# Patient Record
Sex: Male | Born: 1950 | Race: White | Hispanic: No | Marital: Married | Smoking: Never smoker
Health system: Southern US, Community
[De-identification: ages and names within clinical notes are randomized; demographics above are authoritative.]

## PROBLEM LIST (undated history)

## (undated) DIAGNOSIS — E785 Hyperlipidemia, unspecified: Secondary | ICD-10-CM

## (undated) DIAGNOSIS — I10 Essential (primary) hypertension: Secondary | ICD-10-CM

## (undated) DIAGNOSIS — N183 Chronic kidney disease, stage 3 (moderate): Principal | ICD-10-CM

## (undated) DIAGNOSIS — Z9889 Other specified postprocedural states: Secondary | ICD-10-CM

## (undated) DIAGNOSIS — R112 Nausea with vomiting, unspecified: Secondary | ICD-10-CM

## (undated) DIAGNOSIS — M109 Gout, unspecified: Secondary | ICD-10-CM

## (undated) DIAGNOSIS — T148XXA Other injury of unspecified body region, initial encounter: Secondary | ICD-10-CM

## (undated) DIAGNOSIS — E1122 Type 2 diabetes mellitus with diabetic chronic kidney disease: Principal | ICD-10-CM

## (undated) DIAGNOSIS — M779 Enthesopathy, unspecified: Secondary | ICD-10-CM

## (undated) DIAGNOSIS — E291 Testicular hypofunction: Secondary | ICD-10-CM

## (undated) DIAGNOSIS — T7840XA Allergy, unspecified, initial encounter: Secondary | ICD-10-CM

## (undated) DIAGNOSIS — E119 Type 2 diabetes mellitus without complications: Secondary | ICD-10-CM

## (undated) HISTORY — DX: Testicular hypofunction: E29.1

## (undated) HISTORY — DX: Essential (primary) hypertension: I10

## (undated) HISTORY — DX: Chronic kidney disease, stage 3 (moderate): N18.3

## (undated) HISTORY — DX: Allergy, unspecified, initial encounter: T78.40XA

## (undated) HISTORY — DX: Type 2 diabetes mellitus with diabetic chronic kidney disease: E11.22

## (undated) HISTORY — DX: Hyperlipidemia, unspecified: E78.5

## (undated) HISTORY — PX: CYST REMOVAL HAND: SHX6279

## (undated) HISTORY — DX: Enthesopathy, unspecified: M77.9

## (undated) HISTORY — DX: Type 2 diabetes mellitus without complications: E11.9

## (undated) HISTORY — DX: Gout, unspecified: M10.9

---

## 1996-06-20 HISTORY — PX: WRIST SURGERY: SHX841

## 1998-05-10 ENCOUNTER — Emergency Department (HOSPITAL_COMMUNITY): Admission: EM | Admit: 1998-05-10 | Discharge: 1998-05-10 | Payer: Self-pay | Admitting: Family Medicine

## 2004-07-08 ENCOUNTER — Emergency Department (HOSPITAL_COMMUNITY): Admission: EM | Admit: 2004-07-08 | Discharge: 2004-07-08 | Payer: Self-pay | Admitting: Emergency Medicine

## 2008-06-20 HISTORY — PX: PROSTATE BIOPSY: SHX241

## 2009-07-31 ENCOUNTER — Ambulatory Visit (HOSPITAL_COMMUNITY): Admission: AD | Admit: 2009-07-31 | Discharge: 2009-08-01 | Payer: Self-pay | Admitting: Urology

## 2010-09-09 LAB — HEMOGLOBIN AND HEMATOCRIT, BLOOD
HCT: 41.9 % (ref 39.0–52.0)
HCT: 46.8 % (ref 39.0–52.0)
Hemoglobin: 14.1 g/dL (ref 13.0–17.0)
Hemoglobin: 16.1 g/dL (ref 13.0–17.0)

## 2010-09-09 LAB — CBC
MCHC: 34 g/dL (ref 30.0–36.0)
MCV: 82.5 fL (ref 78.0–100.0)
Platelets: 231 10*3/uL (ref 150–400)
RBC: 6.66 MIL/uL — ABNORMAL HIGH (ref 4.22–5.81)
RDW: 13.1 % (ref 11.5–15.5)

## 2010-09-09 LAB — PROTIME-INR
INR: 1.01 (ref 0.00–1.49)
Prothrombin Time: 13.2 seconds (ref 11.6–15.2)

## 2010-12-08 ENCOUNTER — Other Ambulatory Visit: Payer: Self-pay | Admitting: Internal Medicine

## 2013-07-04 ENCOUNTER — Ambulatory Visit: Payer: Self-pay | Admitting: Internal Medicine

## 2013-08-04 DIAGNOSIS — N183 Chronic kidney disease, stage 3 unspecified: Secondary | ICD-10-CM | POA: Insufficient documentation

## 2013-08-04 DIAGNOSIS — I10 Essential (primary) hypertension: Secondary | ICD-10-CM | POA: Insufficient documentation

## 2013-08-04 DIAGNOSIS — E1122 Type 2 diabetes mellitus with diabetic chronic kidney disease: Secondary | ICD-10-CM | POA: Insufficient documentation

## 2013-08-04 DIAGNOSIS — E785 Hyperlipidemia, unspecified: Secondary | ICD-10-CM

## 2013-08-04 DIAGNOSIS — M109 Gout, unspecified: Secondary | ICD-10-CM | POA: Insufficient documentation

## 2013-08-04 DIAGNOSIS — E349 Endocrine disorder, unspecified: Secondary | ICD-10-CM | POA: Insufficient documentation

## 2013-08-04 DIAGNOSIS — E1169 Type 2 diabetes mellitus with other specified complication: Secondary | ICD-10-CM | POA: Insufficient documentation

## 2013-08-07 ENCOUNTER — Ambulatory Visit: Payer: Self-pay | Admitting: Internal Medicine

## 2013-08-29 ENCOUNTER — Encounter: Payer: Self-pay | Admitting: Internal Medicine

## 2013-08-29 ENCOUNTER — Other Ambulatory Visit: Payer: Self-pay | Admitting: Internal Medicine

## 2013-08-29 ENCOUNTER — Ambulatory Visit (INDEPENDENT_AMBULATORY_CARE_PROVIDER_SITE_OTHER): Payer: BC Managed Care – PPO | Admitting: Internal Medicine

## 2013-08-29 VITALS — BP 148/80 | HR 72 | Temp 98.6°F | Resp 16 | Ht 69.0 in | Wt 190.0 lb

## 2013-08-29 DIAGNOSIS — I10 Essential (primary) hypertension: Secondary | ICD-10-CM

## 2013-08-29 DIAGNOSIS — Z79899 Other long term (current) drug therapy: Secondary | ICD-10-CM

## 2013-08-29 DIAGNOSIS — E119 Type 2 diabetes mellitus without complications: Secondary | ICD-10-CM

## 2013-08-29 DIAGNOSIS — E559 Vitamin D deficiency, unspecified: Secondary | ICD-10-CM

## 2013-08-29 DIAGNOSIS — E785 Hyperlipidemia, unspecified: Secondary | ICD-10-CM

## 2013-08-29 LAB — BASIC METABOLIC PANEL WITH GFR
BUN: 22 mg/dL (ref 6–23)
CHLORIDE: 103 meq/L (ref 96–112)
CO2: 27 meq/L (ref 19–32)
CREATININE: 1.17 mg/dL (ref 0.50–1.35)
Calcium: 9.7 mg/dL (ref 8.4–10.5)
GFR, Est African American: 77 mL/min
GFR, Est Non African American: 66 mL/min
Glucose, Bld: 103 mg/dL — ABNORMAL HIGH (ref 70–99)
Potassium: 4.8 mEq/L (ref 3.5–5.3)
Sodium: 137 mEq/L (ref 135–145)

## 2013-08-29 LAB — HEMOGLOBIN A1C
HEMOGLOBIN A1C: 7.1 % — AB (ref <5.7)
Mean Plasma Glucose: 157 mg/dL — ABNORMAL HIGH (ref <117)

## 2013-08-29 MED ORDER — BISOPROLOL-HYDROCHLOROTHIAZIDE 5-6.25 MG PO TABS: 1.0000 | ORAL_TABLET | Freq: Every day | ORAL | Status: DC

## 2013-08-29 NOTE — Progress Notes (Signed)
Patient ID: Emeric Novinger, male   DOB: [birth year], 63 y.o.   MRN: [id number]    This very nice 63 y.o. WWM presents for 3 month follow up with Hypertension, Hyperlipidemia, Pre-Diabetes and Vitamin D Deficiency.    HTN predates since 2000. BP has been controlled at home. Today's BP: 148/80 mmHg and rechecked at 164/84. Patient denies any cardiac type chest pain, palpitations, dyspnea/orthopnea/PND, dizziness, claudication, or dependent edema. GFR was 62 in Oct 2014 consistent with Stage II CKD   Hyperlipidemia is not controlled with diet & note patient is statin intolerant. Last Cholesterol was 229, Triglycerides were 349, HDL 34 and LDL 125. Patient denies myalgias or other med SE's.    Also, the patient has history of T2DM with A1c 7.6% in 2006 and last A1c was 7.3% in Oct 2014. Patient admits poor diet. Patient denies any symptoms of reactive hypoglycemia, diabetic polys, paresthesias or visual blurring.   Further, Patient has history of Vitamin D Deficiency of 14 in 2010 with last vitamin D of 68 in Oct 2014. Patient supplements vitamin D without any suspected side-effects. Patient also has Hx/o Testosterone Deficiency  In 2008 and has declined treatment as he feels and functions well.  Medication Sig  . allopurinol (ZYLOPRIM) 300 MG tablet Take 300 mg by mouth daily.  Marland Kitchen BABY ASPIRIN PO Take 81 mg by mouth daily.  . Cholecalciferol (VITAMIN D PO) Take 5,000 Int'l Units by mouth daily.  . enalapril (VASOTEC) 20 MG tablet Take 20 mg by mouth daily.      Allergies  Allergen Reactions  . Crestor [Rosuvastatin]   . Welchol [Colesevelam Hcl]   . Zoloft [Sertraline Hcl]   . Penicillins Rash    PMHx:   Past Medical History  Diagnosis Date  . Hyperlipidemia   . Hypertension   . Type II or unspecified type diabetes mellitus without mention of complication, not stated as uncontrolled   . Other testicular hypofunction   . Gout     FHx:    Reviewed / unchanged  SHx:    Reviewed /  unchanged     Systems Review: Constitutional: Denies fever, chills, wt changes, headaches, insomnia, fatigue, night sweats, change in appetite. Eyes: Denies redness, blurred vision, diplopia, discharge, itchy, watery eyes.  ENT: Denies discharge, congestion, post nasal drip, epistaxis, sore throat, earache, hearing loss, dental pain, tinnitus, vertigo, sinus pain, snoring.  CV: Denies chest pain, palpitations, irregular heartbeat, syncope, dyspnea, diaphoresis, orthopnea, PND, claudication, edema. Respiratory: denies cough, dyspnea, DOE, pleurisy, hoarseness, laryngitis, wheezing.  Gastrointestinal: Denies dysphagia, odynophagia, heartburn, reflux, water brash, abdominal pain or cramps, nausea, vomiting, bloating, diarrhea, constipation, hematemesis, melena, hematochezia,  or hemorrhoids. Genitourinary: Denies dysuria, frequency, urgency, nocturia, hesitancy, discharge, hematuria, flank pain. Musculoskeletal: Denies arthralgias, myalgias, stiffness, jt. swelling, pain, limp, strain/sprain.  Skin: Denies pruritus, rash, hives, warts, acne, eczema, change in skin lesion(s). Neuro: No weakness, tremor, incoordination, spasms, paresthesia, or pain. Psychiatric: Denies confusion, memory loss, or sensory loss. Endo: Denies change in weight, skin, hair change.  Heme/Lymph: No excessive bleeding, bruising, orenlarged lymph nodes.   Exam:  BP 148/80  Pulse 72  Temp(Src) 98.6 F (37 C) (Temporal)  Resp 16  Ht 5\' 9"  (1.753 m)  Wt 190 lb (86.183 kg)  BMI 28.05 kg/m2  Appears well nourished - in no distress. Eyes: PERRLA, EOMs, conjunctiva no swelling or erythema. Sinuses: No frontal/maxillary tenderness ENT/Mouth: EAC's clear, TM's nl w/o erythema, bulging. Nares clear w/o erythema, swelling, exudates. Oropharynx clear without erythema or exudates. Oral  hygiene is good. Tongue normal, non obstructing. Hearing intact.  Neck: Supple. Thyroid nl. Car 2+/2+ without bruits, nodes or JVD. Chest:  Respirations nl with BS clear & equal w/o rales, rhonchi, wheezing or stridor.  Cor: Heart sounds normal w/ regular rate and rhythm without sig. murmurs, gallops, clicks, or rubs. Peripheral pulses normal and equal  without edema.  Abdomen: Soft & bowel sounds normal. Non-tender w/o guarding, rebound, hernias, masses, or organomegaly.  Lymphatics: Unremarkable.  Musculoskeletal: Full ROM all peripheral extremities, joint stability, 5/5 strength, and normal gait.  Skin: Warm, dry without exposed rashes, lesions, ecchymosis apparent.  Neuro: Cranial nerves intact, reflexes equal bilaterally. Sensory-motor testing grossly intact. Tendon reflexes grossly intact.  Pysch: Alert & oriented x 3. Insight and judgement nl & appropriate. No ideations.  Assessment and Plan:  1. Hypertension - Continue monitor blood pressure at home. Continue diet/meds same.  2. Hyperlipidemia - Continue diet/meds, exercise,& lifestyle modifications. Continue monitor periodic cholesterol/liver & renal functions   3. T2 NIDDM/Stage II CKD- continue recommend prudent low glycemic diet, weight control, regular exercise, diabetic monitoring and periodic eye exams.  4. Vitamin D Deficiency - Continue supplementation.  Recommended regular exercise, BP monitoring, weight control, and discussed med and SE's. Recommended labs to assess and monitor clinical status. Further disposition pending results of labs.

## 2013-08-29 NOTE — Patient Instructions (Signed)

## 2013-09-03 ENCOUNTER — Encounter (INDEPENDENT_AMBULATORY_CARE_PROVIDER_SITE_OTHER): Payer: Self-pay

## 2013-12-30 ENCOUNTER — Encounter: Payer: Self-pay | Admitting: Internal Medicine

## 2013-12-30 NOTE — Progress Notes (Signed)
Patient ID: [name], male   DOB: Apr 17, 1951, 63 y.o.   MRN: [id number]  2sd     R e s c h e d u l e

## 2014-02-05 ENCOUNTER — Other Ambulatory Visit: Payer: Self-pay | Admitting: Internal Medicine

## 2014-02-07 ENCOUNTER — Other Ambulatory Visit: Payer: Self-pay | Admitting: Internal Medicine

## 2014-02-07 ENCOUNTER — Ambulatory Visit (INDEPENDENT_AMBULATORY_CARE_PROVIDER_SITE_OTHER): Payer: BC Managed Care – PPO | Admitting: Internal Medicine

## 2014-02-07 ENCOUNTER — Encounter: Payer: Self-pay | Admitting: Internal Medicine

## 2014-02-07 VITALS — BP 158/86 | HR 64 | Temp 98.2°F | Resp 16 | Ht 68.5 in | Wt 191.2 lb

## 2014-02-07 DIAGNOSIS — E559 Vitamin D deficiency, unspecified: Secondary | ICD-10-CM

## 2014-02-07 DIAGNOSIS — R7401 Elevation of levels of liver transaminase levels: Secondary | ICD-10-CM

## 2014-02-07 DIAGNOSIS — I10 Essential (primary) hypertension: Secondary | ICD-10-CM

## 2014-02-07 DIAGNOSIS — Z0000 Encounter for general adult medical examination without abnormal findings: Secondary | ICD-10-CM

## 2014-02-07 DIAGNOSIS — Z113 Encounter for screening for infections with a predominantly sexual mode of transmission: Secondary | ICD-10-CM

## 2014-02-07 DIAGNOSIS — E1129 Type 2 diabetes mellitus with other diabetic kidney complication: Secondary | ICD-10-CM

## 2014-02-07 DIAGNOSIS — R740 Nonspecific elevation of levels of transaminase and lactic acid dehydrogenase [LDH]: Secondary | ICD-10-CM

## 2014-02-07 DIAGNOSIS — Z1212 Encounter for screening for malignant neoplasm of rectum: Secondary | ICD-10-CM

## 2014-02-07 DIAGNOSIS — Z125 Encounter for screening for malignant neoplasm of prostate: Secondary | ICD-10-CM

## 2014-02-07 DIAGNOSIS — Z111 Encounter for screening for respiratory tuberculosis: Secondary | ICD-10-CM

## 2014-02-07 MED ORDER — BISOPROLOL-HYDROCHLOROTHIAZIDE 5-6.25 MG PO TABS: ORAL_TABLET | ORAL | Status: DC

## 2014-02-07 NOTE — Patient Instructions (Signed)
Recommend the book "The END of DIETING" by Dr Baker Janus   and the book "The END of DIABETES " by Dr Excell Seltzer  At [email or web address] - get book & Audio CD's      Being diabetic has a  300% increased risk for heart attack, stroke, cancer, and alzheimer- type vascular dementia. It is very important that you work harder with diet by avoiding all foods that are white except chicken & fish. Avoid white rice (brown & wild rice is OK), white potatoes (sweetpotatoes in moderation is OK), White bread or wheat bread or anything made out of white flour like bagels, donuts, rolls, buns, biscuits, cakes, pastries, cookies, pizza crust, and pasta (made from white flour & egg whites) - vegetarian pasta or spinach or wheat pasta is OK. Multigrain breads like Arnold's or [employer], or multigrain sandwich thins or flatbreads.  Diet, exercise and weight loss can reverse and cure diabetes in the early stages.  Diet, exercise and weight loss is very important in the control and prevention of complications of diabetes which affects every system in your body, ie. Brain - dementia/stroke, eyes - glaucoma/blindness, heart - heart attack/heart failure, kidneys - dialysis, stomach - gastric paralysis, intestines - malabsorption, nerves - severe painful neuritis, circulation - gangrene & loss of a leg(s), and finally cancer and Alzheimers.    I recommend avoid fried & greasy foods,  sweets/candy, white rice (brown or wild rice or Quinoa is OK), white potatoes (sweet potatoes are OK) - anything made from white flour - bagels, doughnuts, rolls, buns, biscuits,white and wheat breads, pizza crust and traditional pasta made of white flour & egg white(vegetarian pasta or spinach or wheat pasta is OK).  Multi-grain bread is OK - like multi-grain flat bread or sandwich thins. Avoid alcohol in excess. Exercise is also important.    Eat all the vegetables you want - avoid meat, especially red meat and dairy - especially cheese.  Cheese  is the most concentrated form of trans-fats which is the worst thing to clog up our arteries. Veggie cheese is OK which can be found in the fresh produce section at [employer] or [employer] or [employer]  Preventive Care for Adults A healthy lifestyle and preventive care can promote health and wellness. Preventive health guidelines for men include the following key practices:  A routine yearly physical is a good way to check with your health care provider about your health and preventative screening. It is a chance to share any concerns and updates on your health and to receive a thorough exam.  Visit your dentist for a routine exam and preventative care every 6 months. Brush your teeth twice a day and floss once a day. Good oral hygiene prevents tooth decay and gum disease.  The frequency of eye exams is based on your age, health, family medical history, use of contact lenses, and other factors. Follow your health care provider's recommendations for frequency of eye exams.  Eat a healthy diet. Foods such as vegetables, fruits, whole grains, low-fat dairy products, and lean protein foods contain the nutrients you need without too many calories. Decrease your intake of foods high in solid fats, added sugars, and salt. Eat the right amount of calories for you.Get information about a proper diet from your health care provider, if necessary.  Regular physical exercise is one of the most important things you can do for your health. Most adults should get at least 150 minutes of moderate-intensity exercise (any activity that  increases your heart rate and causes you to sweat) each week. In addition, most adults need muscle-strengthening exercises on 2 or more days a week.  Maintain a healthy weight. The body mass index (BMI) is a screening tool to identify possible weight problems. It provides an estimate of body fat based on height and weight. Your health care provider can find your BMI and can help you  achieve or maintain a healthy weight.For adults 20 years and older:  A BMI below 18.5 is considered underweight.  A BMI of 18.5 to 24.9 is normal.  A BMI of 25 to 29.9 is considered overweight.  A BMI of 30 and above is considered obese.  Maintain normal blood lipids and cholesterol levels by exercising and minimizing your intake of saturated fat. Eat a balanced diet with plenty of fruit and vegetables. Blood tests for lipids and cholesterol should begin at age 20 and be repeated every 5 years. If your lipid or cholesterol levels are high, you are over 50, or you are at high risk for heart disease, you may need your cholesterol levels checked more frequently.Ongoing high lipid and cholesterol levels should be treated with medicines if diet and exercise are not working.  If you smoke, find out from your health care provider how to quit. If you do not use tobacco, do not start.  Lung cancer screening is recommended for adults aged 72-80 years who are at high risk for developing lung cancer because of a history of smoking. A yearly low-dose CT scan of the lungs is recommended for people who have at least a 30-pack-year history of smoking and are a current smoker or have quit within the past 15 years. A pack year of smoking is smoking an average of 1 pack of cigarettes a day for 1 year (for example: 1 pack a day for 30 years or 2 packs a day for 15 years). Yearly screening should continue until the smoker has stopped smoking for at least 15 years. Yearly screening should be stopped for people who develop a health problem that would prevent them from having lung cancer treatment.  If you choose to drink alcohol, do not have more than 2 drinks per day. One drink is considered to be 12 ounces (355 mL) of beer, 5 ounces (148 mL) of wine, or 1.5 ounces (44 mL) of liquor.  Avoid use of street drugs. Do not share needles with anyone. Ask for help if you need support or instructions about stopping the use of  drugs.  High blood pressure causes heart disease and increases the risk of stroke. Your blood pressure should be checked at least every 1-2 years. Ongoing high blood pressure should be treated with medicines, if weight loss and exercise are not effective.  If you are 28-64 years old, ask your health care provider if you should take aspirin to prevent heart disease.  Diabetes screening involves taking a blood sample to check your fasting blood sugar level. This should be done once every 3 years, after age 13, if you are within normal weight and without risk factors for diabetes. Testing should be considered at a younger age or be carried out more frequently if you are overweight and have at least 1 risk factor for diabetes.  Colorectal cancer can be detected and often prevented. Most routine colorectal cancer screening begins at the age of 78 and continues through age 56. However, your health care provider may recommend screening at an earlier age if you have risk  factors for colon cancer. On a yearly basis, your health care provider may provide home test kits to check for hidden blood in the stool. Use of a small camera at the end of a tube to directly examine the colon (sigmoidoscopy or colonoscopy) can detect the earliest forms of colorectal cancer. Talk to your health care provider about this at age 48, when routine screening begins. Direct exam of the colon should be repeated every 5-10 years through age 60, unless early forms of precancerous polyps or small growths are found.  People who are at an increased risk for hepatitis B should be screened for this virus. You are considered at high risk for hepatitis B if:  You were born in a country where hepatitis B occurs often. Talk with your health care provider about which countries are considered high risk.  Your parents were born in a high-risk country and you have not received a shot to protect against hepatitis B (hepatitis B vaccine).  You have  HIV or AIDS.  You use needles to inject street drugs.  You live with, or have sex with, someone who has hepatitis B.  You are a man who has sex with other men (MSM).  You get hemodialysis treatment.  You take certain medicines for conditions such as cancer, organ transplantation, and autoimmune conditions.  Hepatitis C blood testing is recommended for all people born from [birth year] through 1965 and any individual with known risks for hepatitis C.  Practice safe sex. Use condoms and avoid high-risk sexual practices to reduce the spread of sexually transmitted infections (STIs). STIs include gonorrhea, chlamydia, syphilis, trichomonas, herpes, HPV, and human immunodeficiency virus (HIV). Herpes, HIV, and HPV are viral illnesses that have no cure. They can result in disability, cancer, and death.  If you are at risk of being infected with HIV, it is recommended that you take a prescription medicine daily to prevent HIV infection. This is called preexposure prophylaxis (PrEP). You are considered at risk if:  You are a man who has sex with other men (MSM) and have other risk factors.  You are a heterosexual man, are sexually active, and are at increased risk for HIV infection.  You take drugs by injection.  You are sexually active with a partner who has HIV.  Talk with your health care provider about whether you are at high risk of being infected with HIV. If you choose to begin PrEP, you should first be tested for HIV. You should then be tested every 3 months for as long as you are taking PrEP.  A one-time screening for abdominal aortic aneurysm (AAA) and surgical repair of large AAAs by ultrasound are recommended for men ages 51 to 11 years who are current or former smokers.  Healthy men should no longer receive prostate-specific antigen (PSA) blood tests as part of routine cancer screening. Talk with your health care provider about prostate cancer screening.  Testicular cancer screening is  not recommended for adult males who have no symptoms. Screening includes self-exam, a health care provider exam, and other screening tests. Consult with your health care provider about any symptoms you have or any concerns you have about testicular cancer.  Use sunscreen. Apply sunscreen liberally and repeatedly throughout the day. You should seek shade when your shadow is shorter than you. Protect yourself by wearing long sleeves, pants, a wide-brimmed hat, and sunglasses year round, whenever you are outdoors.  Once a month, do a whole-body skin exam, using a mirror to look  at the skin on your back. Tell your health care provider about new moles, moles that have irregular borders, moles that are larger than a pencil eraser, or moles that have changed in shape or color.  Stay current with required vaccines (immunizations).  Influenza vaccine. All adults should be immunized every year.  Tetanus, diphtheria, and acellular pertussis (Td, Tdap) vaccine. An adult who has not previously received Tdap or who does not know his vaccine status should receive 1 dose of Tdap. This initial dose should be followed by tetanus and diphtheria toxoids (Td) booster doses every 10 years. Adults with an unknown or incomplete history of completing a 3-dose immunization series with Td-containing vaccines should begin or complete a primary immunization series including a Tdap dose. Adults should receive a Td booster every 10 years.  Varicella vaccine. An adult without evidence of immunity to varicella should receive 2 doses or a second dose if he has previously received 1 dose.  Human papillomavirus (HPV) vaccine. Males aged 29-21 years who have not received the vaccine previously should receive the 3-dose series. Males aged 22-26 years may be immunized. Immunization is recommended through the age of 26 years for any male who has sex with males and did not get any or all doses earlier. Immunization is recommended for any  person with an immunocompromised condition through the age of 29 years if he did not get any or all doses earlier. During the 3-dose series, the second dose should be obtained 4-8 weeks after the first dose. The third dose should be obtained 24 weeks after the first dose and 16 weeks after the second dose.  Zoster vaccine. One dose is recommended for adults aged 38 years or older unless certain conditions are present.  Measles, mumps, and rubella (MMR) vaccine. Adults born before [birth year] generally are considered immune to measles and mumps. Adults born in [birth year] or later should have 1 or more doses of MMR vaccine unless there is a contraindication to the vaccine or there is laboratory evidence of immunity to each of the three diseases. A routine second dose of MMR vaccine should be obtained at least 28 days after the first dose for students attending postsecondary schools, health care workers, or international travelers. People who received inactivated measles vaccine or an unknown type of measles vaccine during 1963-1967 should receive 2 doses of MMR vaccine. People who received inactivated mumps vaccine or an unknown type of mumps vaccine before 1979 and are at high risk for mumps infection should consider immunization with 2 doses of MMR vaccine. Unvaccinated health care workers born before [birth year] who lack laboratory evidence of measles, mumps, or rubella immunity or laboratory confirmation of disease should consider measles and mumps immunization with 2 doses of MMR vaccine or rubella immunization with 1 dose of MMR vaccine.  Pneumococcal 13-valent conjugate (PCV13) vaccine. When indicated, a person who is uncertain of his immunization history and has no record of immunization should receive the PCV13 vaccine. An adult aged 68 years or older who has certain medical conditions and has not been previously immunized should receive 1 dose of PCV13 vaccine. This PCV13 should be followed with a dose of pneumococcal  polysaccharide (PPSV23) vaccine. The PPSV23 vaccine dose should be obtained at least 8 weeks after the dose of PCV13 vaccine. An adult aged 95 years or older who has certain medical conditions and previously received 1 or more doses of PPSV23 vaccine should receive 1 dose of PCV13. The PCV13 vaccine dose should be obtained 1  or more years after the last PPSV23 vaccine dose.  Pneumococcal polysaccharide (PPSV23) vaccine. When PCV13 is also indicated, PCV13 should be obtained first. All adults aged 10 years and older should be immunized. An adult younger than age 43 years who has certain medical conditions should be immunized. Any person who resides in a nursing home or long-term care facility should be immunized. An adult smoker should be immunized. People with an immunocompromised condition and certain other conditions should receive both PCV13 and PPSV23 vaccines. People with human immunodeficiency virus (HIV) infection should be immunized as soon as possible after diagnosis. Immunization during chemotherapy or radiation therapy should be avoided. Routine use of PPSV23 vaccine is not recommended for American Indians, [place]atives, or people younger than 65 years unless there are medical conditions that require PPSV23 vaccine. When indicated, people who have unknown immunization and have no record of immunization should receive PPSV23 vaccine. One-time revaccination 5 years after the first dose of PPSV23 is recommended for people aged 19-64 years who have chronic kidney failure, nephrotic syndrome, asplenia, or immunocompromised conditions. People who received 1-2 doses of PPSV23 before age 43 years should receive another dose of PPSV23 vaccine at age 43 years or later if at least 5 years have passed since the previous dose. Doses of PPSV23 are not needed for people immunized with PPSV23 at or after age 63 years.  Meningococcal vaccine. Adults with asplenia or persistent complement component deficiencies  should receive 2 doses of quadrivalent meningococcal conjugate (MenACWY-D) vaccine. The doses should be obtained at least 2 months apart. Microbiologists working with certain meningococcal bacteria, [place] recruits, people at risk during an outbreak, and people who travel to or live in countries with a high rate of meningitis should be immunized. A first-year college student up through age 70 years who is living in a residence hall should receive a dose if he did not receive a dose on or after his 16th birthday. Adults who have certain high-risk conditions should receive one or more doses of vaccine.  Hepatitis A vaccine. Adults who wish to be protected from this disease, have certain high-risk conditions, work with hepatitis A-infected animals, work in hepatitis A research labs, or travel to or work in countries with a high rate of hepatitis A should be immunized. Adults who were previously unvaccinated and who anticipate close contact with an international adoptee during the first 60 days after arrival in the [place] from a country with a high rate of hepatitis A should be immunized.  Hepatitis B vaccine. Adults should be immunized if they wish to be protected from this disease, have certain high-risk conditions, may be exposed to blood or other infectious body fluids, are household contacts or sex partners of hepatitis B positive people, are clients or workers in certain care facilities, or travel to or work in countries with a high rate of hepatitis B.  Haemophilus influenzae type b (Hib) vaccine. A previously unvaccinated person with asplenia or sickle cell disease or having a scheduled splenectomy should receive 1 dose of Hib vaccine. Regardless of previous immunization, a recipient of a hematopoietic stem cell transplant should receive a 3-dose series 6-12 months after his successful transplant. Hib vaccine is not recommended for adults with HIV infection. Preventive Service / Frequency Ages  76 to 70  Blood pressure check.** / Every 1 to 2 years.  Lipid and cholesterol check.** / Every 5 years beginning at age 32.  Lung cancer screening. / Every year if you are aged 85-80  years and have a 30-pack-year history of smoking and currently smoke or have quit within the past 15 years. Yearly screening is stopped once you have quit smoking for at least 15 years or develop a health problem that would prevent you from having lung cancer treatment.  Fecal occult blood test (FOBT) of stool. / Every year beginning at age 29 and continuing until age 38. You may not have to do this test if you get a colonoscopy every 10 years.  Flexible sigmoidoscopy** or colonoscopy.** / Every 5 years for a flexible sigmoidoscopy or every 10 years for a colonoscopy beginning at age 4 and continuing until age 30.  Hepatitis C blood test.** / For all people born from [birth year] through 1965 and any individual with known risks for hepatitis C.  Skin self-exam. / Monthly.  Influenza vaccine. / Every year.  Tetanus, diphtheria, and acellular pertussis (Tdap/Td) vaccine.** / Consult your health care provider. 1 dose of Td every 10 years.  Varicella vaccine.** / Consult your health care provider.  Zoster vaccine.** / 1 dose for adults aged 52 years or older.  Measles, mumps, rubella (MMR) vaccine.** / You need at least 1 dose of MMR if you were born in 1957 or later. You may also need a second dose.  Pneumococcal 13-valent conjugate (PCV13) vaccine.** / Consult your health care provider.  Pneumococcal polysaccharide (PPSV23) vaccine.** / 1 to 2 doses if you smoke cigarettes or if you have certain conditions.  Meningococcal vaccine.** / Consult your health care provider.  Hepatitis A vaccine.** / Consult your health care provider.  Hepatitis B vaccine.** / Consult your health care provider.  Haemophilus influenzae type b (Hib) vaccine.** / Consult your health care provider.  Abdominal aortic aneurysm (AAA)  screening.** / Screening for current or former smokers or history of Hypertension or Diabetes

## 2014-02-07 NOTE — Progress Notes (Signed)
Patient ID: [name], male   DOB: [birth year], 63 y.o.   MRN: [id number]   Annual Screening Comprehensive Examination  This very nice 63 y.o.WWM presents for complete physical.  Patient has been followed for HTN, T2_DM, Hyperlipidemia, Testosterone and Vitamin D Deficiency.   HTN predates since 2000. Patient's BP has been controlled at home. Today's BP is elevated  158/86 mmHg and patient did not fill last Rx for Ziac. Patient denies any cardiac symptoms as chest pain, palpitations, shortness of breath, dizziness or ankle swelling.   Patient's hyperlipidemia is not controlled with diet and he is allergic/intolerant to Statins & Welchol. Patient denies myalgias or other medication SE's. Last Chol elevated at 229, TG 349, HDL 34 and LDL 125 and patient has not filled prescribed Zetia in the past.    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 Patient admits not monitoring CBG's. Last A1c was 7.1% on 08/29/2013.    Finally, patient has history of Vitamin D Deficiency of 14 in 2010 and last vitamin D was 68 in Oct 2014.   Medication Sig  . allopurinol  300 MG tablet Take 300 mg by mouth daily.  Marland Kitchen bASA 81 mg Take 1 tab daily   . VITAMIN D Take 5,000 Int'l Units by mouth daily.  . enalapril 20 MG tablet TAKE 1 TABLET EVERY DAY   Allergies  Allergen Reactions  . Crestor [Rosuvastatin]   . Welchol [Colesevelam Hcl]   . Zoloft [Sertraline Hcl]   . Penicillins Rash   Past Medical History  Diagnosis Date  . Hyperlipidemia   . Hypertension   . Type II or unspecified type diabetes mellitus without mention of complication, not stated as uncontrolled   . Other testicular hypofunction   . Gout    Past Surgical History  Procedure Laterality Date  . Wrist surgery Right 1998  . Prostate biopsy  2010   Family History  Problem Relation Age of Onset  . Heart disease Father    . Kidney disease Father   . Heart attack Father    History   Social History  . Marital Status: Widowed in May 2010 (after 20 yr marriage)    Spouse Name: N/A    Number of Children: 1 adopted daughter and 2 [place]  . Years of Education: N/A   Occupational History  . Sales at [employer]  . Smoking status: Never Smoker   . Smokeless tobacco: Never Used  . Alcohol Use: No  . Drug Use: No  . Sexual Activity: Active    ROS Constitutional: Denies fever, chills, weight loss/gain, headaches, insomnia, fatigue, night sweats or change in appetite. Eyes: Denies redness, blurred vision, diplopia, discharge, itchy or watery eyes.  ENT: Denies discharge, congestion, post nasal drip, epistaxis, sore throat, earache, hearing loss, dental pain, Tinnitus, Vertigo, Sinus pain or snoring.  Cardio: Denies chest pain, palpitations, irregular heartbeat, syncope, dyspnea, diaphoresis, orthopnea, PND, claudication or edema Respiratory: denies cough, dyspnea, DOE, pleurisy, hoarseness, laryngitis or wheezing.  Gastrointestinal: Denies dysphagia, heartburn, reflux, water brash, pain, cramps, nausea, vomiting, bloating, diarrhea, constipation, hematemesis, melena, hematochezia, jaundice or hemorrhoids Genitourinary: Denies dysuria, frequency, urgency, nocturia, hesitancy, discharge, hematuria or flank pain Musculoskeletal: Denies arthralgia, myalgia, stiffness, Jt. Swelling, pain, limp or strain/sprain. Denies Falls. Skin: Denies puritis, rash, hives, warts, acne, eczema or change in skin lesion Neuro: No weakness, tremor, incoordination, spasms, paresthesia  or pain Psychiatric: Denies confusion, memory loss or sensory loss. Denies Depression. Endocrine: Denies change in weight, skin, hair change, nocturia, and paresthesia, diabetic polys, visual blurring or hyper / hypo glycemic episodes.  Heme/Lymph: No excessive bleeding, bruising or enlarged lymph  nodes.   Physical Exam  BP 158/86  Pulse 64  Temp(Src) 98.2 F (36.8 C) (Temporal)  Resp 16  Ht 5' 8.5" (1.74 m)  Wt 191 lb 3.2 oz (86.728 kg)  BMI 28.65 kg/m2  General Appearance: Well nourished, in no apparent distress. Eyes: PERRLA, EOMs, conjunctiva no swelling or erythema, normal fundi and vessels. Sinuses: No frontal/maxillary tenderness ENT/Mouth: EACs patent / TMs  nl. Nares clear without erythema, swelling, mucoid exudates. Oral hygiene is good. No erythema, swelling, or exudate. Tongue normal, non-obstructing. Tonsils not swollen or erythematous. Hearing normal.  Neck: Supple, thyroid normal. No bruits, nodes or JVD. Respiratory: Respiratory effort normal.  BS equal and clear bilateral without rales, rhonci, wheezing or stridor. Cardio: Heart sounds are normal with regular rate and rhythm and no murmurs, rubs or gallops. Peripheral pulses are normal and equal bilaterally without edema. No aortic or femoral bruits. Chest: symmetric with normal excursions and percussion.  Abdomen: Flat, soft, with bowl sounds. Nontender, no guarding, rebound, hernias, masses, or organomegaly.  Lymphatics: Non tender without lymphadenopathy.  Genitourinary: No hernias.Testes nl. DRE - prostate nl for age - smooth & firm w/o nodules. Musculoskeletal: Full ROM all peripheral extremities, joint stability, 5/5 strength, and normal gait. Skin: Warm and dry without rashes, lesions, cyanosis, clubbing or  ecchymosis.  Neuro: Cranial nerves intact, reflexes equal bilaterally. Normal muscle tone, no cerebellar symptoms. Sensation intact.  Pysch: Awake and oriented X 3with normal affect, insight and judgment appropriate.  Assessment and Plan  1. Annual Screening Examination 2. Hypertension - re-written Rx Ziac 5  3. Hyperlipidemia 4. T2_NIDDM w/Stage 2 CKD 5. Vitamin D Deficiency 6. Testosterone Deficiency 7. Gout  Continue prudent diet as discussed, weight control, BP monitoring, regular  exercise, and medications as discussed.  Discussed med effects and SE's. Routine screening labs and tests as requested with regular follow-up as recommended.Discussed poor compliance issues with patient.

## 2014-02-08 LAB — CBC WITH DIFFERENTIAL/PLATELET
BASOS PCT: 1 % (ref 0–1)
Basophils Absolute: 0.1 10*3/uL (ref 0.0–0.1)
EOS ABS: 0.2 10*3/uL (ref 0.0–0.7)
Eosinophils Relative: 3 % (ref 0–5)
HEMATOCRIT: 46.9 % (ref 39.0–52.0)
HEMOGLOBIN: 16.2 g/dL (ref 13.0–17.0)
Lymphocytes Relative: 28 % (ref 12–46)
Lymphs Abs: 1.8 10*3/uL (ref 0.7–4.0)
MCH: 28.1 pg (ref 26.0–34.0)
MCHC: 34.5 g/dL (ref 30.0–36.0)
MCV: 81.3 fL (ref 78.0–100.0)
MONO ABS: 0.5 10*3/uL (ref 0.1–1.0)
Monocytes Relative: 8 % (ref 3–12)
Neutro Abs: 3.9 10*3/uL (ref 1.7–7.7)
Neutrophils Relative %: 60 % (ref 43–77)
Platelets: 241 10*3/uL (ref 150–400)
RBC: 5.77 MIL/uL (ref 4.22–5.81)
RDW: 14.4 % (ref 11.5–15.5)
WBC: 6.5 10*3/uL (ref 4.0–10.5)

## 2014-02-08 LAB — HEPATIC FUNCTION PANEL
ALT: 41 U/L (ref 0–53)
AST: 32 U/L (ref 0–37)
Albumin: 4.9 g/dL (ref 3.5–5.2)
Alkaline Phosphatase: 59 U/L (ref 39–117)
BILIRUBIN DIRECT: 0.1 mg/dL (ref 0.0–0.3)
BILIRUBIN INDIRECT: 0.7 mg/dL (ref 0.2–1.2)
Total Bilirubin: 0.8 mg/dL (ref 0.2–1.2)
Total Protein: 7.5 g/dL (ref 6.0–8.3)

## 2014-02-08 LAB — URIC ACID: Uric Acid, Serum: 7.5 mg/dL (ref 4.0–7.8)

## 2014-02-08 LAB — URINALYSIS, MICROSCOPIC ONLY
BACTERIA UA: NONE SEEN
Casts: NONE SEEN
Crystals: NONE SEEN
Squamous Epithelial / LPF: NONE SEEN

## 2014-02-08 LAB — BASIC METABOLIC PANEL WITH GFR
BUN: 16 mg/dL (ref 6–23)
CALCIUM: 9.8 mg/dL (ref 8.4–10.5)
CO2: 23 meq/L (ref 19–32)
Chloride: 99 mEq/L (ref 96–112)
Creat: 1.09 mg/dL (ref 0.50–1.35)
GFR, EST AFRICAN AMERICAN: 84 mL/min
GFR, Est Non African American: 72 mL/min
GLUCOSE: 132 mg/dL — AB (ref 70–99)
POTASSIUM: 4.7 meq/L (ref 3.5–5.3)
SODIUM: 136 meq/L (ref 135–145)

## 2014-02-08 LAB — MAGNESIUM: Magnesium: 2 mg/dL (ref 1.5–2.5)

## 2014-02-08 LAB — HEPATITIS C ANTIBODY: HCV Ab: NEGATIVE

## 2014-02-08 LAB — HEMOGLOBIN A1C
Hgb A1c MFr Bld: 7.1 % — ABNORMAL HIGH (ref <5.7)
MEAN PLASMA GLUCOSE: 157 mg/dL — AB (ref <117)

## 2014-02-08 LAB — VITAMIN D 25 HYDROXY (VIT D DEFICIENCY, FRACTURES): Vit D, 25-Hydroxy: 56 ng/mL (ref 30–89)

## 2014-02-08 LAB — VITAMIN B12: Vitamin B-12: 360 pg/mL (ref 211–911)

## 2014-02-08 LAB — LIPID PANEL
CHOL/HDL RATIO: 6.6 ratio
Cholesterol: 249 mg/dL — ABNORMAL HIGH (ref 0–200)
HDL: 38 mg/dL — ABNORMAL LOW (ref >39)
LDL Cholesterol: 167 mg/dL — ABNORMAL HIGH (ref 0–99)
Triglycerides: 221 mg/dL — ABNORMAL HIGH (ref <150)
VLDL: 44 mg/dL — AB (ref 0–40)

## 2014-02-08 LAB — PSA: PSA: 3.39 ng/mL (ref <=4.00)

## 2014-02-08 LAB — INSULIN, FASTING: Insulin fasting, serum: 14 u[IU]/mL (ref 3–28)

## 2014-02-08 LAB — HEPATITIS B CORE ANTIBODY, TOTAL: HEP B C TOTAL AB: NONREACTIVE

## 2014-02-08 LAB — MICROALBUMIN / CREATININE URINE RATIO
CREATININE, URINE: 30.7 mg/dL
MICROALB UR: 0.5 mg/dL (ref 0.00–1.89)
MICROALB/CREAT RATIO: 16.3 mg/g (ref 0.0–30.0)

## 2014-02-08 LAB — HEPATITIS A ANTIBODY, TOTAL: HEP A TOTAL AB: REACTIVE — AB

## 2014-02-08 LAB — HEPATITIS B SURFACE ANTIBODY,QUALITATIVE: HEP B S AB: NEGATIVE

## 2014-02-08 LAB — TESTOSTERONE: TESTOSTERONE: 385 ng/dL (ref 300–890)

## 2014-02-08 LAB — HIV ANTIBODY (ROUTINE TESTING W REFLEX): HIV 1&2 Ab, 4th Generation: NONREACTIVE

## 2014-02-08 LAB — TSH: TSH: 1.425 u[IU]/mL (ref 0.350–4.500)

## 2014-02-08 LAB — RPR

## 2014-02-09 ENCOUNTER — Other Ambulatory Visit: Payer: Self-pay | Admitting: Internal Medicine

## 2014-02-09 MED ORDER — METFORMIN HCL ER 500 MG PO TB24: ORAL_TABLET | ORAL | Status: DC

## 2014-02-10 LAB — HEPATITIS B E ANTIBODY: HEPATITIS BE ANTIBODY: NONREACTIVE

## 2014-02-11 ENCOUNTER — Other Ambulatory Visit: Payer: Self-pay | Admitting: *Deleted

## 2014-02-11 DIAGNOSIS — R972 Elevated prostate specific antigen [PSA]: Secondary | ICD-10-CM

## 2014-02-11 LAB — PSA, TOTAL AND FREE
PSA FREE PCT: 22 % — AB (ref >25)
PSA FREE: 0.8 ng/mL
PSA: 3.72 ng/mL (ref <=4.00)

## 2014-02-11 LAB — TB SKIN TEST
Induration: 0 mm
TB Skin Test: NEGATIVE

## 2014-02-12 ENCOUNTER — Telehealth: Payer: Self-pay | Admitting: *Deleted

## 2014-02-12 NOTE — Telephone Encounter (Signed)
Pt aware of lab results 

## 2014-04-04 ENCOUNTER — Other Ambulatory Visit: Payer: Self-pay

## 2014-05-20 ENCOUNTER — Other Ambulatory Visit: Payer: Self-pay | Admitting: Internal Medicine

## 2014-05-20 DIAGNOSIS — I10 Essential (primary) hypertension: Secondary | ICD-10-CM

## 2014-05-20 MED ORDER — ENALAPRIL MALEATE 20 MG PO TABS: 20.0000 mg | ORAL_TABLET | Freq: Every day | ORAL | Status: DC

## 2014-05-21 ENCOUNTER — Encounter: Payer: Self-pay | Admitting: Physician Assistant

## 2014-05-21 ENCOUNTER — Ambulatory Visit: Payer: Self-pay | Admitting: Physician Assistant

## 2014-05-21 ENCOUNTER — Ambulatory Visit (INDEPENDENT_AMBULATORY_CARE_PROVIDER_SITE_OTHER): Payer: BC Managed Care – PPO | Admitting: Physician Assistant

## 2014-05-21 VITALS — BP 130/72 | HR 60 | Temp 98.1°F | Resp 16 | Ht 68.5 in | Wt 192.0 lb

## 2014-05-21 DIAGNOSIS — E785 Hyperlipidemia, unspecified: Secondary | ICD-10-CM

## 2014-05-21 DIAGNOSIS — E1122 Type 2 diabetes mellitus with diabetic chronic kidney disease: Secondary | ICD-10-CM

## 2014-05-21 DIAGNOSIS — Z79899 Other long term (current) drug therapy: Secondary | ICD-10-CM

## 2014-05-21 DIAGNOSIS — R972 Elevated prostate specific antigen [PSA]: Secondary | ICD-10-CM

## 2014-05-21 DIAGNOSIS — Z125 Encounter for screening for malignant neoplasm of prostate: Secondary | ICD-10-CM

## 2014-05-21 DIAGNOSIS — N182 Chronic kidney disease, stage 2 (mild): Secondary | ICD-10-CM

## 2014-05-21 DIAGNOSIS — E559 Vitamin D deficiency, unspecified: Secondary | ICD-10-CM

## 2014-05-21 DIAGNOSIS — I10 Essential (primary) hypertension: Secondary | ICD-10-CM

## 2014-05-21 NOTE — Progress Notes (Signed)
Assessment and Plan:  Hypertension: Continue medication, monitor blood pressure at home. Continue DASH diet.  Reminder to go to the ER if any CP, SOB, nausea, dizziness, severe HA, changes vision/speech, left arm numbness and tingling and jaw pain. Cholesterol: Continue diet and exercise. Check cholesterol. Long discussion will think about praulent.  Diabetes with CKD-Continue diet and exercise. Check A1C Vitamin D Def- check level and continue medications Elevated PSA- history of biopsy with complications and would prefer to monitor his numbers.   Continue diet and meds as discussed. Further disposition pending results of labs. Discussed med's effects and SE's.   HPI 63 y.o. male  presents for 3 month follow up with hypertension, hyperlipidemia, diabetes and vitamin D. His blood pressure has been controlled at home, today their BP is BP: 130/72 mmHg He does workout. He denies chest pain, shortness of breath, dizziness.  He is not on cholesterol medication, declines statins due to intolerance.  His cholesterol is not at goal. The cholesterol was:  02/07/2014: Cholesterol, Total 249*; HDL Cholesterol by NMR 38*; LDL (calc) 167*; Triglycerides 221* He has been working on diet and exercise for Diabetes, he is on bASA, ACE, and on Metformin, and denies polydipsia, polyuria and visual disturbances. Last A1C  was: 02/07/2014: Hemoglobin-A1c 7.1* Patient is on Vitamin D supplement. 02/07/2014: Vit D, 25-Hydroxy 107 Fiance's son was in a car wreck 3 weeks ago and he is paralyzed from waist down, which  has increased stress.  His PSA has been elevated, will check free and total and monitor closely.  Lab Results  Component Value Date   PSA 3.39 02/07/2014   PSA 3.72 02/07/2014   BMI is Body mass index is 28.77 kg/(m^2)., he is working on diet and exercise and has done well.  Wt Readings from Last 3 Encounters:  05/21/14 192 lb (87.091 kg)  02/07/14 191 lb 3.2 oz (86.728 kg)  08/29/13 190 lb (86.183 kg)      Current Medications:  Current Outpatient Prescriptions on File Prior to Visit  Medication Sig Dispense Refill  . allopurinol (ZYLOPRIM) 300 MG tablet Take 300 mg by mouth daily.    Marland Kitchen BABY ASPIRIN PO Take 81 mg by mouth daily.    . bisoprolol-hydrochlorothiazide (ZIAC) 5-6.25 MG per tablet Take 1 tablet daily for BP 90 tablet 99  . Cholecalciferol (VITAMIN D PO) Take 5,000 Int'l Units by mouth daily.    . enalapril (VASOTEC) 20 MG tablet Take 1 tablet (20 mg total) by mouth daily. 90 tablet 1  . metFORMIN (GLUCOPHAGE XR) 500 MG 24 hr tablet Take 1 tablet 2 x day with Bkfst and Lunch & 2 tablets with Supper for Diabetes 360 tablet 99   No current facility-administered medications on file prior to visit.   Medical History:  Past Medical History  Diagnosis Date  . Hyperlipidemia   . Hypertension   . Type II or unspecified type diabetes mellitus without mention of complication, not stated as uncontrolled   . Other testicular hypofunction   . Gout    Allergies:  Allergies  Allergen Reactions  . Crestor [Rosuvastatin]   . Welchol [Colesevelam Hcl]   . Zoloft [Sertraline Hcl]   . Penicillins Rash     Review of Systems: [X]  = complains of  [ ]  = denies  General: Fatigue [ ]  Fever [ ]  Chills [ ]  Weakness [ ]   Insomnia [ ]  Eyes: Redness [ ]  Blurred vision [ ]  Diplopia [ ]   ENT: Congestion [ ]  Sinus Pain [ ]   Post Nasal Drip [ ]  Sore Throat [ ]  Earache [ ]   Cardiac: Chest pain/pressure [ ]  SOB [ ]  Orthopnea [ ]   Palpitations [ ]   Paroxysmal nocturnal dyspnea[ ]  Claudication [ ]  Edema [ ]   Pulmonary: Cough [ ]  Wheezing[ ]   SOB [ ]   Snoring [ ]   GI: Nausea [ ]  Vomiting[ ]  Dysphagia[ ]  Heartburn[ ]  Abdominal pain [ ]  Constipation [ ] ; Diarrhea [ ] ; BRBPR [ ]  Melena[ ]  GU: Hematuria[ ]  Dysuria [ ]  Nocturia[ ]  Urgency [ ]   Hesitancy [ ]  Discharge [ ]  Neuro: Headaches[ ]  Vertigo[ ]  Paresthesias[ ]  Spasm [ ]  Speech changes [ ]  Incoordination [ ]   Ortho: Arthritis [ ]  Joint pain [ ]  Muscle  pain [ ]  Joint swelling [ ]  Back Pain [ ]  Skin:  Rash [ ]   Pruritis [ ]  Change in skin lesion [ ]   Psych: Depression[ ]  Anxiety[ ]  Confusion [ ]  Memory loss [ ]   Heme/Lypmh: Bleeding [ ]  Bruising [ ]  Enlarged lymph nodes [ ]   Endocrine: Visual blurring [ ]  Paresthesia [ ]  Polyuria [ ]  Polydypsea [ ]    Heat/cold intolerance [ ]  Hypoglycemia [ ]   Family history- Review and unchanged Social history- Review and unchanged Physical Exam: BP 130/72 mmHg  Pulse 60  Temp(Src) 98.1 F (36.7 C)  Resp 16  Ht 5' 8.5" (1.74 m)  Wt 192 lb (87.091 kg)  BMI 28.77 kg/m2 Wt Readings from Last 3 Encounters:  05/21/14 192 lb (87.091 kg)  02/07/14 191 lb 3.2 oz (86.728 kg)  08/29/13 190 lb (86.183 kg)   General Appearance: Well nourished, in no apparent distress. Eyes: PERRLA, EOMs, conjunctiva no swelling or erythema Sinuses: No Frontal/maxillary tenderness ENT/Mouth: Ext aud canals clear, TMs without erythema, bulging. No erythema, swelling, or exudate on post pharynx.  Tonsils not swollen or erythematous. Hearing normal.  Neck: Supple, thyroid normal.  Respiratory: Respiratory effort normal, BS equal bilaterally without rales, rhonchi, wheezing or stridor.  Cardio: RRR with no MRGs. Brisk peripheral pulses without edema.  Abdomen: Soft, + BS.  Non tender, no guarding, rebound, hernias, masses. Lymphatics: Non tender without lymphadenopathy.  Musculoskeletal: Full ROM, 5/5 strength, normal gait.  Skin: Warm, dry without rashes, lesions, ecchymosis.  Neuro: Cranial nerves intact. No cerebellar symptoms. Sensation intact.  Psych: Awake and oriented X 3, normal affect, Insight and Judgment appropriate.    Vicie Mutters, PA-C 4:31 PM [facility]

## 2014-05-21 NOTE — Patient Instructions (Addendum)
Benefiber is good for constipation/diarrhea/irritable bowel syndrome, it helps with weight loss and can help lower your bad cholesterol. Please do 1-2 TBSP in the morning in water, coffee, or tea. It can take up to a month before you can see a difference with your bowel movements. It is cheapest from [employer], [employer], [employer].   We are starting you on Metformin to prevent or treat diabetes. Metformin does not cause low blood sugars. In order to create energy your cells need insulin and sugar but sometime your cells do not accept the insulin and this can cause increased sugars and decreased energy. The Metformin helps your cells accept insulin and the sugar to give you more energy.   The two most common side effects are nausea and diarrhea, follow these rules to avoid it! You can take imodium per box instructions when starting metformin if needed.   Rules of metformin: 1) start out slow with only one pill daily. Our goal for you is 4 pills a day or 2000mg  total.  2) take with your largest meal. 3) Take with least amount of carbs.   Call if you have any problems.    Your LDL is not in range. Your LDL is the bad cholesterol that can lead to heart attack and stroke. To lower your number you can decrease your fatty foods, red meat, cheese, milk and increase fiber like whole grains and veggies. You can also add a fiber supplement like Metamucil or Benefiber.      Bad carbs also include fruit juice, alcohol, and sweet tea. These are empty calories that do not signal to your brain that you are full.   Please remember the good carbs are still carbs which convert into sugar. So please measure them out no more than 1/2-1 cup of rice, oatmeal, pasta, and beans  Veggies are however free foods! Pile them on.   Not all fruit is created equal. Please see the list below, the fruit at the bottom is higher in sugars than the fruit at the top. Please avoid all dried fruits.

## 2014-05-22 LAB — CBC WITH DIFFERENTIAL/PLATELET
Basophils Absolute: 0.1 10*3/uL (ref 0.0–0.1)
Basophils Relative: 1 % (ref 0–1)
EOS ABS: 0.3 10*3/uL (ref 0.0–0.7)
Eosinophils Relative: 4 % (ref 0–5)
HEMATOCRIT: 44.3 % (ref 39.0–52.0)
Hemoglobin: 14.9 g/dL (ref 13.0–17.0)
LYMPHS ABS: 2.3 10*3/uL (ref 0.7–4.0)
LYMPHS PCT: 32 % (ref 12–46)
MCH: 28.3 pg (ref 26.0–34.0)
MCHC: 33.6 g/dL (ref 30.0–36.0)
MCV: 84.1 fL (ref 78.0–100.0)
MPV: 9.1 fL — AB (ref 9.4–12.4)
Monocytes Absolute: 0.6 10*3/uL (ref 0.1–1.0)
Monocytes Relative: 8 % (ref 3–12)
NEUTROS ABS: 4 10*3/uL (ref 1.7–7.7)
NEUTROS PCT: 55 % (ref 43–77)
Platelets: 237 10*3/uL (ref 150–400)
RBC: 5.27 MIL/uL (ref 4.22–5.81)
RDW: 13.5 % (ref 11.5–15.5)
WBC: 7.3 10*3/uL (ref 4.0–10.5)

## 2014-05-22 LAB — INSULIN, FASTING: Insulin fasting, serum: 7.3 u[IU]/mL (ref 2.0–19.6)

## 2014-05-22 LAB — HEPATIC FUNCTION PANEL
ALK PHOS: 49 U/L (ref 39–117)
ALT: 28 U/L (ref 0–53)
AST: 22 U/L (ref 0–37)
Albumin: 4.4 g/dL (ref 3.5–5.2)
BILIRUBIN TOTAL: 0.5 mg/dL (ref 0.2–1.2)
Bilirubin, Direct: 0.1 mg/dL (ref 0.0–0.3)
Indirect Bilirubin: 0.4 mg/dL (ref 0.2–1.2)
TOTAL PROTEIN: 7 g/dL (ref 6.0–8.3)

## 2014-05-22 LAB — LIPID PANEL
Cholesterol: 222 mg/dL — ABNORMAL HIGH (ref 0–200)
HDL: 30 mg/dL — AB (ref >39)
LDL CALC: 115 mg/dL — AB (ref 0–99)
Total CHOL/HDL Ratio: 7.4 Ratio
Triglycerides: 383 mg/dL — ABNORMAL HIGH (ref <150)
VLDL: 77 mg/dL — ABNORMAL HIGH (ref 0–40)

## 2014-05-22 LAB — BASIC METABOLIC PANEL WITH GFR
BUN: 25 mg/dL — ABNORMAL HIGH (ref 6–23)
CHLORIDE: 103 meq/L (ref 96–112)
CO2: 24 meq/L (ref 19–32)
Calcium: 9.5 mg/dL (ref 8.4–10.5)
Creat: 1.34 mg/dL (ref 0.50–1.35)
GFR, Est African American: 65 mL/min
GFR, Est Non African American: 56 mL/min — ABNORMAL LOW
Glucose, Bld: 92 mg/dL (ref 70–99)
POTASSIUM: 4.8 meq/L (ref 3.5–5.3)
SODIUM: 139 meq/L (ref 135–145)

## 2014-05-22 LAB — TSH: TSH: 1.805 u[IU]/mL (ref 0.350–4.500)

## 2014-05-22 LAB — PSA, TOTAL AND FREE
PSA FREE PCT: 29 % (ref >25)
PSA FREE: 0.59 ng/mL
PSA: 2.05 ng/mL (ref <=4.00)

## 2014-05-22 LAB — HEMOGLOBIN A1C
Hgb A1c MFr Bld: 6.8 % — ABNORMAL HIGH (ref <5.7)
Mean Plasma Glucose: 148 mg/dL — ABNORMAL HIGH (ref <117)

## 2014-05-22 LAB — MAGNESIUM: Magnesium: 1.7 mg/dL (ref 1.5–2.5)

## 2014-05-22 LAB — VITAMIN D 25 HYDROXY (VIT D DEFICIENCY, FRACTURES): Vit D, 25-Hydroxy: 38 ng/mL (ref 30–100)

## 2014-08-28 ENCOUNTER — Ambulatory Visit: Payer: Self-pay | Admitting: Internal Medicine

## 2014-09-03 ENCOUNTER — Encounter: Payer: Self-pay | Admitting: Internal Medicine

## 2014-09-03 ENCOUNTER — Ambulatory Visit (INDEPENDENT_AMBULATORY_CARE_PROVIDER_SITE_OTHER): Payer: BLUE CROSS/BLUE SHIELD | Admitting: Internal Medicine

## 2014-09-03 VITALS — BP 118/78 | HR 68 | Temp 97.5°F | Resp 16 | Ht 68.5 in | Wt 193.8 lb

## 2014-09-03 DIAGNOSIS — Z79899 Other long term (current) drug therapy: Secondary | ICD-10-CM

## 2014-09-03 DIAGNOSIS — E349 Endocrine disorder, unspecified: Secondary | ICD-10-CM

## 2014-09-03 DIAGNOSIS — E559 Vitamin D deficiency, unspecified: Secondary | ICD-10-CM

## 2014-09-03 DIAGNOSIS — N182 Chronic kidney disease, stage 2 (mild): Secondary | ICD-10-CM

## 2014-09-03 DIAGNOSIS — M109 Gout, unspecified: Secondary | ICD-10-CM

## 2014-09-03 DIAGNOSIS — E785 Hyperlipidemia, unspecified: Secondary | ICD-10-CM

## 2014-09-03 DIAGNOSIS — E1122 Type 2 diabetes mellitus with diabetic chronic kidney disease: Secondary | ICD-10-CM

## 2014-09-03 DIAGNOSIS — E291 Testicular hypofunction: Secondary | ICD-10-CM

## 2014-09-03 DIAGNOSIS — I10 Essential (primary) hypertension: Secondary | ICD-10-CM

## 2014-09-03 LAB — BASIC METABOLIC PANEL WITH GFR
BUN: 26 mg/dL — AB (ref 6–23)
CALCIUM: 10 mg/dL (ref 8.4–10.5)
CO2: 26 mEq/L (ref 19–32)
Chloride: 100 mEq/L (ref 96–112)
Creat: 1.57 mg/dL — ABNORMAL HIGH (ref 0.50–1.35)
GFR, Est African American: 53 mL/min — ABNORMAL LOW
GFR, Est Non African American: 46 mL/min — ABNORMAL LOW
GLUCOSE: 100 mg/dL — AB (ref 70–99)
Potassium: 5 mEq/L (ref 3.5–5.3)
SODIUM: 137 meq/L (ref 135–145)

## 2014-09-03 LAB — CBC WITH DIFFERENTIAL/PLATELET
Basophils Absolute: 0 10*3/uL (ref 0.0–0.1)
Basophils Relative: 0 % (ref 0–1)
EOS ABS: 0.2 10*3/uL (ref 0.0–0.7)
Eosinophils Relative: 3 % (ref 0–5)
HCT: 47.4 % (ref 39.0–52.0)
Hemoglobin: 16.3 g/dL (ref 13.0–17.0)
LYMPHS ABS: 2.3 10*3/uL (ref 0.7–4.0)
LYMPHS PCT: 30 % (ref 12–46)
MCH: 28.2 pg (ref 26.0–34.0)
MCHC: 34.4 g/dL (ref 30.0–36.0)
MCV: 82 fL (ref 78.0–100.0)
MONO ABS: 0.5 10*3/uL (ref 0.1–1.0)
MPV: 9.1 fL (ref 8.6–12.4)
Monocytes Relative: 6 % (ref 3–12)
Neutro Abs: 4.6 10*3/uL (ref 1.7–7.7)
Neutrophils Relative %: 61 % (ref 43–77)
Platelets: 256 10*3/uL (ref 150–400)
RBC: 5.78 MIL/uL (ref 4.22–5.81)
RDW: 13.5 % (ref 11.5–15.5)
WBC: 7.5 10*3/uL (ref 4.0–10.5)

## 2014-09-03 LAB — LIPID PANEL
CHOL/HDL RATIO: 7.6 ratio
CHOLESTEROL: 228 mg/dL — AB (ref 0–200)
HDL: 30 mg/dL — ABNORMAL LOW (ref >=40)
LDL Cholesterol: 142 mg/dL — ABNORMAL HIGH (ref 0–99)
Triglycerides: 278 mg/dL — ABNORMAL HIGH (ref <150)
VLDL: 56 mg/dL — AB (ref 0–40)

## 2014-09-03 LAB — HEPATIC FUNCTION PANEL
ALBUMIN: 4.8 g/dL (ref 3.5–5.2)
ALT: 41 U/L (ref 0–53)
AST: 34 U/L (ref 0–37)
Alkaline Phosphatase: 47 U/L (ref 39–117)
Bilirubin, Direct: 0.1 mg/dL (ref 0.0–0.3)
Indirect Bilirubin: 0.7 mg/dL (ref 0.2–1.2)
Total Bilirubin: 0.8 mg/dL (ref 0.2–1.2)
Total Protein: 7.5 g/dL (ref 6.0–8.3)

## 2014-09-03 LAB — TSH: TSH: 2.418 u[IU]/mL (ref 0.350–4.500)

## 2014-09-03 LAB — MAGNESIUM: Magnesium: 1.9 mg/dL (ref 1.5–2.5)

## 2014-09-03 LAB — TESTOSTERONE: TESTOSTERONE: 253 ng/dL — AB (ref 300–890)

## 2014-09-03 LAB — URIC ACID: URIC ACID, SERUM: 9.6 mg/dL — AB (ref 4.0–7.8)

## 2014-09-03 NOTE — Patient Instructions (Signed)

## 2014-09-03 NOTE — Progress Notes (Signed)
Patient ID: [name], male   DOB: 08/25/1950, 64 y.o.   MRN: [id number]   This very nice 64 y.o. [place] presents for 3 month follow up with Hypertension, Hyperlipidemia, T2_NIDDM and Vitamin D Deficiency.    Patient is treated for HTN since 2000  & BP has been controlled at home. Today's BP: 118/78 mmHg. Patient has had no complaints of any cardiac type chest pain, palpitations, dyspnea/orthopnea/PND, dizziness, claudication, or dependent edema.   Hyperlipidemia is not controlled with diet. Last Lipids were not at goal - Total  Chol 222*; HDL 30; LDL 115; Triglycerides 383 on 05/21/2014.   Also, the patient has history of T2_NIDDM since 2002and has Stage 2 CKD  and has had no symptoms of reactive hypoglycemia, diabetic polys, paresthesias or visual blurring.  Last A1c was  6.8% on  05/21/2014.   Further, the patient also has history of Vitamin D Deficiency of 14 in 2008 and supplements vitamin D without any suspected side-effects. Last vitamin D was still very low 38 on  05/21/2014.  Medication Sig  . allopurinol  300 MG tablet Take 300 mg by mouth daily.  Marland Kitchen BABY ASPIRIN  Take 81 mg by mouth daily.  . bisoprolol-hctz (ZIAC) 5-6.25 MG  Take 1 tablet daily for BP  . VITAMIN D  Take 5,000 Int'l Units by mouth daily.  . enalapril  20 MG tablet Take 1 tablet (20 mg total) by mouth daily.  . metFORMIN  XR) 500 MG 24 hr tablet Take 1 tablet 2 x day with Bkfst and Lunch & 2 tablets with Supper for Diabetes   Allergies  Allergen Reactions  . Crestor [Rosuvastatin]   . Welchol [Colesevelam Hcl]   . Zoloft [Sertraline Hcl]   . Penicillins Rash   PMHx:   Past Medical History  Diagnosis Date  . Hyperlipidemia   . Hypertension   . Type II or unspecified type diabetes mellitus without mention of complication, not stated as uncontrolled   . Other testicular hypofunction   . Gout    Immunization History  Administered Date(s) Administered  . PPD Test 02/07/2014  . Pneumococcal Polysaccharide-23  11/01/2010  . Td 12/25/2012  . Zoster 12/25/2012   Past Surgical History  Procedure Laterality Date  . Wrist surgery Right 1998  . Prostate biopsy  2010   FHx:    Reviewed / unchanged  SHx:    Reviewed / unchanged  Systems Review:  Constitutional: Denies fever, chills, wt changes, headaches, insomnia, fatigue, night sweats, change in appetite. Eyes: Denies redness, blurred vision, diplopia, discharge, itchy, watery eyes.  ENT: Denies discharge, congestion, post nasal drip, epistaxis, sore throat, earache, hearing loss, dental pain, tinnitus, vertigo, sinus pain, snoring.  CV: Denies chest pain, palpitations, irregular heartbeat, syncope, dyspnea, diaphoresis, orthopnea, PND, claudication or edema. Respiratory: denies cough, dyspnea, DOE, pleurisy, hoarseness, laryngitis, wheezing.  Gastrointestinal: Denies dysphagia, odynophagia, heartburn, reflux, water brash, abdominal pain or cramps, nausea, vomiting, bloating, diarrhea, constipation, hematemesis, melena, hematochezia  or hemorrhoids. Genitourinary: Denies dysuria, frequency, urgency, nocturia, hesitancy, discharge, hematuria or flank pain. Musculoskeletal: Denies arthralgias, myalgias, stiffness, jt. swelling, pain, limping or strain/sprain.  Skin: Denies pruritus, rash, hives, warts, acne, eczema or change in skin lesion(s). Neuro: No weakness, tremor, incoordination, spasms, paresthesia or pain. Psychiatric: Denies confusion, memory loss or sensory loss. Endo: Denies change in weight, skin or hair change.  Heme/Lymph: No excessive bleeding, bruising or enlarged lymph nodes.  Physical Exam  BP 118/78   Pulse 68  Temp 97.5 F   Resp  16  Ht 5' 8.5" Wt 193 lb 12.8 oz     BMI 29.04   Appears well nourished and in no distress. Eyes: PERRLA, EOMs, conjunctiva no swelling or erythema. Sinuses: No frontal/maxillary tenderness ENT/Mouth: EAC's clear, TM's nl w/o erythema, bulging. Nares clear w/o erythema, swelling, exudates.  Oropharynx clear without erythema or exudates. Oral hygiene is good. Tongue normal, non obstructing. Hearing intact.  Neck: Supple. Thyroid nl. Car 2+/2+ without bruits, nodes or JVD. Chest: Respirations nl with BS clear & equal w/o rales, rhonchi, wheezing or stridor.  Cor: Heart sounds normal w/ regular rate and rhythm without sig. murmurs, gallops, clicks, or rubs. Peripheral pulses normal and equal  without edema.  Abdomen: Soft & bowel sounds normal. Non-tender w/o guarding, rebound, hernias, masses, or organomegaly.  Lymphatics: Unremarkable.  Musculoskeletal: Full ROM all peripheral extremities, joint stability, 5/5 strength, and normal gait.  Skin: Warm, dry without exposed rashes, lesions or ecchymosis apparent.  Neuro: Cranial nerves intact, reflexes equal bilaterally. Sensory-motor testing grossly intact. Tendon reflexes grossly intact.  Pysch: Alert & oriented x 3.  Insight and judgement nl & appropriate. No ideations.  Assessment and Plan:  1. Hypertension - Continue monitor blood pressure at home. Continue diet/meds same.  2. Hyperlipidemia - Continue diet/meds, exercise,& lifestyle modifications. Continue monitor periodic cholesterol/liver & renal functions   3. T2_NIDDM w/CKD2 - Continue diet, exercise, lifestyle modifications. Monitor appropriate labs.  4. Vitamin D Deficiency - Continue supplementation.   Recommended regular exercise, BP monitoring, weight control, and discussed med and SE's. Recommended labs to assess and monitor clinical status. Further disposition pending results of labs.

## 2014-09-04 LAB — INSULIN, RANDOM: Insulin: 8.3 u[IU]/mL (ref 2.0–19.6)

## 2014-09-04 LAB — HEMOGLOBIN A1C
Hgb A1c MFr Bld: 6.8 % — ABNORMAL HIGH (ref <5.7)
MEAN PLASMA GLUCOSE: 148 mg/dL — AB (ref <117)

## 2014-09-04 LAB — VITAMIN D 25 HYDROXY (VIT D DEFICIENCY, FRACTURES): VIT D 25 HYDROXY: 39 ng/mL (ref 30–100)

## 2014-12-16 ENCOUNTER — Other Ambulatory Visit: Payer: Self-pay | Admitting: Internal Medicine

## 2014-12-31 ENCOUNTER — Encounter: Payer: Self-pay | Admitting: Internal Medicine

## 2015-02-16 ENCOUNTER — Encounter: Payer: Self-pay | Admitting: Internal Medicine

## 2015-02-16 ENCOUNTER — Ambulatory Visit (INDEPENDENT_AMBULATORY_CARE_PROVIDER_SITE_OTHER): Payer: BLUE CROSS/BLUE SHIELD | Admitting: Internal Medicine

## 2015-02-16 VITALS — BP 124/76 | HR 64 | Temp 97.3°F | Resp 16 | Ht 69.25 in | Wt 192.4 lb

## 2015-02-16 DIAGNOSIS — Z79899 Other long term (current) drug therapy: Secondary | ICD-10-CM

## 2015-02-16 DIAGNOSIS — N182 Chronic kidney disease, stage 2 (mild): Secondary | ICD-10-CM

## 2015-02-16 DIAGNOSIS — E1122 Type 2 diabetes mellitus with diabetic chronic kidney disease: Secondary | ICD-10-CM | POA: Diagnosis not present

## 2015-02-16 DIAGNOSIS — Z111 Encounter for screening for respiratory tuberculosis: Secondary | ICD-10-CM | POA: Diagnosis not present

## 2015-02-16 DIAGNOSIS — I10 Essential (primary) hypertension: Secondary | ICD-10-CM

## 2015-02-16 DIAGNOSIS — Z0000 Encounter for general adult medical examination without abnormal findings: Secondary | ICD-10-CM

## 2015-02-16 DIAGNOSIS — E785 Hyperlipidemia, unspecified: Secondary | ICD-10-CM

## 2015-02-16 DIAGNOSIS — Z125 Encounter for screening for malignant neoplasm of prostate: Secondary | ICD-10-CM

## 2015-02-16 DIAGNOSIS — E119 Type 2 diabetes mellitus without complications: Secondary | ICD-10-CM

## 2015-02-16 DIAGNOSIS — Z6828 Body mass index (BMI) 28.0-28.9, adult: Secondary | ICD-10-CM

## 2015-02-16 DIAGNOSIS — E559 Vitamin D deficiency, unspecified: Secondary | ICD-10-CM

## 2015-02-16 DIAGNOSIS — E349 Endocrine disorder, unspecified: Secondary | ICD-10-CM

## 2015-02-16 DIAGNOSIS — R5383 Other fatigue: Secondary | ICD-10-CM

## 2015-02-16 DIAGNOSIS — Z1212 Encounter for screening for malignant neoplasm of rectum: Secondary | ICD-10-CM

## 2015-02-16 DIAGNOSIS — M1000 Idiopathic gout, unspecified site: Secondary | ICD-10-CM

## 2015-02-16 LAB — CBC WITH DIFFERENTIAL/PLATELET
Basophils Absolute: 0.1 10*3/uL (ref 0.0–0.1)
Basophils Relative: 1 % (ref 0–1)
Eosinophils Absolute: 0.2 10*3/uL (ref 0.0–0.7)
Eosinophils Relative: 3 % (ref 0–5)
HEMATOCRIT: 47.3 % (ref 39.0–52.0)
Hemoglobin: 16 g/dL (ref 13.0–17.0)
LYMPHS ABS: 2 10*3/uL (ref 0.7–4.0)
LYMPHS PCT: 28 % (ref 12–46)
MCH: 28.3 pg (ref 26.0–34.0)
MCHC: 33.8 g/dL (ref 30.0–36.0)
MCV: 83.6 fL (ref 78.0–100.0)
MONO ABS: 0.4 10*3/uL (ref 0.1–1.0)
MONOS PCT: 6 % (ref 3–12)
MPV: 9.3 fL (ref 8.6–12.4)
Neutro Abs: 4.3 10*3/uL (ref 1.7–7.7)
Neutrophils Relative %: 62 % (ref 43–77)
Platelets: 258 10*3/uL (ref 150–400)
RBC: 5.66 MIL/uL (ref 4.22–5.81)
RDW: 13.7 % (ref 11.5–15.5)
WBC: 7 10*3/uL (ref 4.0–10.5)

## 2015-02-16 LAB — TSH: TSH: 1.466 u[IU]/mL (ref 0.350–4.500)

## 2015-02-16 LAB — VITAMIN B12: Vitamin B-12: 427 pg/mL (ref 211–911)

## 2015-02-16 NOTE — Progress Notes (Signed)
Patient ID: [name], male   DOB: [birth year], 64 y.o.   MRN: [id number]   Comprehensive Examination  This very nice 64 y.o. [place] presents for complete physical.  Patient has been followed for HTN, T2_NIDDM, Hyperlipidemia, and Vitamin D Deficiency.   HTN predates since 2000. Patient's BP has been controlled at home.Today's BP: 124/76 mmHg. Patient denies any cardiac symptoms as chest pain, palpitations, shortness of breath, dizziness or ankle swelling.   Patient's hyperlipidemia is not controlled with diet and medications. Patient denies myalgias or other medication SE's. Last lipids were not at goal - Cholesterol 228*; HDL 30*; LDL 142; and elevated Triglycerides 278 on 09/03/2014.   Patient has T2_NIDDM since 2002 and patient denies reactive hypoglycemic symptoms, visual blurring, diabetic polys or paresthesias. Last A1c was 6.8% on 09/03/2014.    Finally, patient has history of Vitamin D Deficiency of 18 in 2009 and 14 in 2010  and last vitamin D was still low at 39 on 09/03/2014.      Medication Sig  . allopurinol  300 MG  Take 300 mg by mouth daily.  Marland Kitchen BABY ASPIRIN  Take 81 mg by mouth daily.  . bisoprolol-hctz [facility]) 5-6.25  Take 1 tablet daily for BP  . VITAMIN D Take 5,000 Int'l Units by mouth daily.  . enalapril  20 MG  TAKE 1 TABLET (20 MG TOTAL) BY MOUTH DAILY.  . metFORMIN  XR 500 MG  Take 1 tablet 2 x day with Bkfst and Lunch & 2 tablets with Supper for Diabetes   Allergies  Allergen Reactions  . Crestor [Rosuvastatin]   . Welchol [Colesevelam Hcl]   . Zoloft [Sertraline Hcl]   . Penicillins Rash   Past Medical History  Diagnosis Date  . Hyperlipidemia   . Hypertension   . Type II or unspecified type diabetes mellitus without mention of complication, not stated as uncontrolled   . Other testicular hypofunction   . Gout    Health Maintenance  Topic Date Due  . OPHTHALMOLOGY EXAM  03/01/1961  . COLONOSCOPY  03/01/2001  . INFLUENZA VACCINE  01/19/2015  . URINE  MICROALBUMIN  02/08/2015  . HEMOGLOBIN A1C  03/06/2015  . FOOT EXAM  09/03/2015  . PNEUMOCOCCAL POLYSACCHARIDE VACCINE (2) 11/01/2015  . TETANUS/TDAP  12/26/2022  . ZOSTAVAX  Completed  . Hepatitis C Screening  Completed  . HIV Screening  Completed   Immunization History  Administered Date(s) Administered  . PPD Test 02/07/2014  . Pneumococcal Polysaccharide-23 11/01/2010  . Td 12/25/2012  . Zoster 12/25/2012   Past Surgical History  Procedure Laterality Date  . Wrist surgery Right 1998  . Prostate biopsy  2010   Family History  Problem Relation Age of Onset  . Heart disease Father   . Kidney disease Father   . Heart attack Father    Social History   Social History  . Marital Status: Single    Spouse Name: N/A  . Number of Children: N/A  . Years of Education: N/A   Occupational History  . Not on file.   Social History Main Topics  . Smoking status: Never Smoker   . Smokeless tobacco: Never Used  . Alcohol Use: No  . Drug Use: No  . Sexual Activity: Not on file   Other Topics Concern  . Not on file   Social History Narrative    ROS Constitutional: Denies fever, chills, weight loss/gain, headaches, insomnia,  night sweats or change in appetite. Does c/o fatigue. Eyes: Denies redness, blurred  vision, diplopia, discharge, itchy or watery eyes.  ENT: Denies discharge, congestion, post nasal drip, epistaxis, sore throat, earache, hearing loss, dental pain, Tinnitus, Vertigo, Sinus pain or snoring.  Cardio: Denies chest pain, palpitations, irregular heartbeat, syncope, dyspnea, diaphoresis, orthopnea, PND, claudication or edema Respiratory: denies cough, dyspnea, DOE, pleurisy, hoarseness, laryngitis or wheezing.  Gastrointestinal: Denies dysphagia, heartburn, reflux, water brash, pain, cramps, nausea, vomiting, bloating, diarrhea, constipation, hematemesis, melena, hematochezia, jaundice or hemorrhoids Genitourinary: Denies dysuria, frequency, urgency, nocturia,  hesitancy, discharge, hematuria or flank pain Musculoskeletal: Denies arthralgia, myalgia, stiffness, Jt. Swelling, pain, limp or strain/sprain. Denies Falls. Skin: Denies puritis, rash, hives, warts, acne, eczema or change in skin lesion Neuro: No weakness, tremor, incoordination, spasms, paresthesia or pain Psychiatric: Denies confusion, memory loss or sensory loss. Denies Depression. Endocrine: Denies change in weight, skin, hair change, nocturia, and paresthesia, diabetic polys, visual blurring or hyper / hypo glycemic episodes.  Heme/Lymph: No excessive bleeding, bruising or enlarged lymph nodes.  Physical Exam  BP 124/76 Pulse 64  Temp 97.3 F Resp 16  Ht 5' 9.25"  Wt 192 lb 6.4 oz     BMI 28.21   General Appearance: Well nourished, in no apparent distress. Eyes: PERRLA, EOMs, conjunctiva no swelling or erythema, normal fundi and vessels. Sinuses: No frontal/maxillary tenderness ENT/Mouth: EACs patent / TMs  nl. Nares clear without erythema, swelling, mucoid exudates. Oral hygiene is good. No erythema, swelling, or exudate. Tongue normal, non-obstructing. Tonsils not swollen or erythematous. Hearing normal.  Neck: Supple, thyroid normal. No bruits, nodes or JVD. Respiratory: Respiratory effort normal.  BS equal and clear bilateral without rales, rhonci, wheezing or stridor. Cardio: Heart sounds are normal with regular rate and rhythm and no murmurs, rubs or gallops. Peripheral pulses are normal and equal bilaterally without edema. No aortic or femoral bruits. Chest: symmetric with normal excursions and percussion.  Abdomen: Flat, soft, with bowel sounds. Nontender, no guarding, rebound, hernias, masses, or organomegaly.  Lymphatics: Non tender without lymphadenopathy.  Genitourinary: No hernias.Testes nl. DRE - prostate nl for age - smooth & firm w/o nodules. Musculoskeletal: Full ROM all peripheral extremities, joint stability, 5/5 strength, and normal gait. Skin: Warm and dry  without rashes, lesions, cyanosis, clubbing or  ecchymosis.  Neuro: Cranial nerves intact, reflexes equal bilaterally. Normal muscle tone, no cerebellar symptoms. Sensation intact.  Pysch: Awake and oriented X 3 with normal affect, insight and judgment appropriate.   Assessment and Plan  1. Essential hypertension  - EKG 12-Lead - [place], RETROPERITNL ABD,  LTD - TSH  2. Hyperlipidemia  - Lipid panel  3. CKD stage 2 due to type 2 diabetes mellitus  - Microalbumin / creatinine urine ratio - HM DIABETES FOOT EXAM - LOW EXTREMITY NEUR EXAM DOCUM - Hemoglobin A1c - Insulin, random  4. Vitamin D deficiency  - Vit D  25 hydroxy   5. Idiopathic gout  - Uric acid  6. Testosterone deficiency  - Testosterone  7. Screening for rectal cancer  - POC Hemoccult Bld/Stl   8. Prostate cancer screening  - PSA  9. Other fatigue  - Vitamin B12 - Iron and TIBC - TSH  10. Medication management  - Urine Microscopic - CBC with Differential/Platelet - BASIC METABOLIC PANEL WITH GFR - Hepatic function panel - Magnesium  11. BMI 28.0-28.9   Continue prudent diet as discussed, weight control, BP monitoring, regular exercise, and medications as discussed.  Discussed med effects and SE's. Routine screening labs and tests as requested with regular follow-up as recommended.  Over 40 minutes of exam, counseling &  chart review was performed

## 2015-02-16 NOTE — Patient Instructions (Signed)

## 2015-02-17 LAB — INSULIN, RANDOM: INSULIN: 12.3 u[IU]/mL (ref 2.0–19.6)

## 2015-02-17 LAB — MAGNESIUM: Magnesium: 1.9 mg/dL (ref 1.5–2.5)

## 2015-02-17 LAB — BASIC METABOLIC PANEL WITH GFR
BUN: 25 mg/dL (ref 7–25)
CALCIUM: 9.8 mg/dL (ref 8.6–10.3)
CO2: 24 mmol/L (ref 20–31)
Chloride: 100 mmol/L (ref 98–110)
Creat: 1.46 mg/dL — ABNORMAL HIGH (ref 0.70–1.25)
GFR, EST AFRICAN AMERICAN: 58 mL/min — AB (ref >=60)
GFR, Est Non African American: 50 mL/min — ABNORMAL LOW (ref >=60)
GLUCOSE: 141 mg/dL — AB (ref 65–99)
Potassium: 4.9 mmol/L (ref 3.5–5.3)
Sodium: 137 mmol/L (ref 135–146)

## 2015-02-17 LAB — LIPID PANEL
CHOLESTEROL: 231 mg/dL — AB (ref 125–200)
HDL: 31 mg/dL — ABNORMAL LOW (ref >=40)
LDL CALC: 148 mg/dL — AB (ref <130)
TRIGLYCERIDES: 260 mg/dL — AB (ref <150)
Total CHOL/HDL Ratio: 7.5 Ratio — ABNORMAL HIGH (ref <=5.0)
VLDL: 52 mg/dL — ABNORMAL HIGH (ref <30)

## 2015-02-17 LAB — URINALYSIS, MICROSCOPIC ONLY
BACTERIA UA: NONE SEEN [HPF]
CRYSTALS: NONE SEEN [HPF]
Casts: NONE SEEN [LPF]
RBC / HPF: NONE SEEN RBC/HPF (ref <=2)
Squamous Epithelial / LPF: NONE SEEN [HPF] (ref <=5)
WBC UA: NONE SEEN WBC/HPF (ref <=5)
Yeast: NONE SEEN [HPF]

## 2015-02-17 LAB — HEPATIC FUNCTION PANEL
ALBUMIN: 4.5 g/dL (ref 3.6–5.1)
ALK PHOS: 51 U/L (ref 40–115)
ALT: 41 U/L (ref 9–46)
AST: 34 U/L (ref 10–35)
Bilirubin, Direct: 0.1 mg/dL (ref <=0.2)
Indirect Bilirubin: 0.7 mg/dL (ref 0.2–1.2)
TOTAL PROTEIN: 7.1 g/dL (ref 6.1–8.1)
Total Bilirubin: 0.8 mg/dL (ref 0.2–1.2)

## 2015-02-17 LAB — VITAMIN D 25 HYDROXY (VIT D DEFICIENCY, FRACTURES): Vit D, 25-Hydroxy: 36 ng/mL (ref 30–100)

## 2015-02-17 LAB — IRON AND TIBC
%SAT: 38 % (ref 15–60)
Iron: 127 ug/dL (ref 50–180)
TIBC: 337 ug/dL (ref 250–425)
UIBC: 210 ug/dL (ref 125–400)

## 2015-02-17 LAB — MICROALBUMIN / CREATININE URINE RATIO
Creatinine, Urine: 120.8 mg/dL
MICROALB UR: 0.4 mg/dL (ref <2.0)
Microalb Creat Ratio: 3.3 mg/g (ref 0.0–30.0)

## 2015-02-17 LAB — TESTOSTERONE: TESTOSTERONE: 275 ng/dL — AB (ref 300–890)

## 2015-02-17 LAB — URIC ACID: Uric Acid, Serum: 9.4 mg/dL — ABNORMAL HIGH (ref 4.0–7.8)

## 2015-02-17 LAB — HEMOGLOBIN A1C
HEMOGLOBIN A1C: 7.3 % — AB (ref <5.7)
MEAN PLASMA GLUCOSE: 163 mg/dL — AB (ref <117)

## 2015-02-17 LAB — PSA: PSA: 2.39 ng/mL (ref <=4.00)

## 2015-04-22 ENCOUNTER — Other Ambulatory Visit: Payer: Self-pay | Admitting: Internal Medicine

## 2015-08-22 ENCOUNTER — Encounter: Payer: Self-pay | Admitting: Internal Medicine

## 2015-08-22 NOTE — Progress Notes (Signed)
Patient ID: [name], male   DOB: 1951/03/15, 65 y.o.   MRN: [id number]   This very nice 65 y.o. [place] presents for 6 month follow up with Hypertension, Hyperlipidemia, Pre-Diabetes and Vitamin D Deficiency. Patient is also c/o head and chest congestion and muco-purulent nasal secretions and sputum for 1-2 weeks.   Patient is treated for HTN since 2000 & BP has been controlled at home. Today's BP: 124/78 mmHg. Patient has had no complaints of any cardiac type chest pain, palpitations, dyspnea/orthopnea/PND, dizziness, claudication, or dependent edema.   Hyperlipidemia is not controlled with diet & patient is intolerant to Rosuvastatin. Patient denies myalgias or other med SE's. Last Lipids were not at goal with  Cholesterol 231; HDL 31*; LDL 148;and elevated Triglycerides 260 on 02/16/2015.   Also, the patient has history of T2_NIDDM since 2002 and has had no symptoms of reactive hypoglycemia, diabetic polys, paresthesias or visual blurring.  Last A1c was not at goal on 7.3% on 02/16/2015.   Further, the patient also has history of Vitamin D Deficiency of "18" in 2009  and supplements vitamin D without any suspected side-effects. Last vitamin D was still very low at 36 on 02/16/2015.  Medication Sig  . allopurinol  300 MG  Take  daily.  Marland Kitchen BABY ASPIRIN  Take  daily.  . bisoprolol-hctz 5-6.25  TAKE 1 TAB DAILY FOR BP  . VITAMIN D Take 5,000 Units daily.  . enalapril  20 MG TAKE 1 TAB DAILY.  . metFORMIN-XR 500 MG  Take 1 tablet 2 x day with Bkfst and Lunch & 2 tablets with Supper for Diabetes   Allergies  Allergen Reactions  . Crestor [Rosuvastatin]   . Welchol [Colesevelam Hcl]   . Zoloft [Sertraline Hcl]   . Penicillins Rash   PMHx:   Past Medical History  Diagnosis Date  . Hyperlipidemia   . Hypertension   . Type II or unspecified type diabetes mellitus without mention of complication, not stated as uncontrolled   . Other testicular hypofunction   . Gout    Immunization History   Administered Date(s) Administered  . PPD Test 02/07/2014, 02/16/2015  . Pneumococcal Polysaccharide-23 11/01/2010  . Td 12/25/2012  . Zoster 12/25/2012   Past Surgical History  Procedure Laterality Date  . Wrist surgery Right 1998  . Prostate biopsy  2010   FHx:    Reviewed / unchanged  SHx:    Reviewed / unchanged  Systems Review:  Constitutional: Denies fever, chills, wt changes, headaches, insomnia, fatigue, night sweats, change in appetite. Eyes: Denies redness, blurred vision, diplopia, discharge, itchy, watery eyes.  ENT: Denies discharge, congestion, post nasal drip, epistaxis, sore throat, earache, hearing loss, dental pain, tinnitus, vertigo, snoring.  Has sinus pressure & pain as above. CV: Denies chest pain, palpitations, irregular heartbeat, syncope, dyspnea, diaphoresis, orthopnea, PND, claudication or edema. Respiratory: denies  dyspnea, DOE, pleurisy, hoarseness, laryngitis, wheezing. Has productive cough as above. Gastrointestinal: Denies dysphagia, odynophagia, heartburn, reflux, water brash, abdominal pain or cramps, nausea, vomiting, bloating, diarrhea, constipation, hematemesis, melena, hematochezia  or hemorrhoids. Genitourinary: Denies dysuria, frequency, urgency, nocturia, hesitancy, discharge, hematuria or flank pain. Musculoskeletal: Denies arthralgias, myalgias, stiffness, jt. swelling, pain, limping or strain/sprain.  Skin: Denies pruritus, rash, hives, warts, acne, eczema or change in skin lesion(s). Neuro: No weakness, tremor, incoordination, spasms, paresthesia or pain. Psychiatric: Denies confusion, memory loss or sensory loss. Endo: Denies change in weight, skin or hair change.  Heme/Lymph: No excessive bleeding, bruising or enlarged lymph nodes.  Physical Exam  BP 124/78 mmHg  Pulse 64  Temp(Src) 97.7 F (36.5 C)  Resp 16  Ht 5' 9.25" (1.759 m)  Wt 196 lb 9.6 oz (89.177 kg)  BMI 28.82 kg/m2  Appears well nourished and in no distress. Brassy  congested cough.   Eyes: PERRLA, EOMs, conjunctiva no swelling or erythema. Sinuses: (+) frontal/maxillary tenderness ENT/Mouth: EAC's clear, TM's nl w/o erythema, bulging. Nares clear w/o erythema, swelling, exudates. Oropharynx clear without erythema or exudates. Oral hygiene is good. Tongue normal, non obstructing. Hearing intact.  Neck: Supple. Thyroid nl. Car 2+/2+ without bruits, nodes or JVD. Chest: Respirations nl with BS clear & equal w/few scattered rales and  rhonchi, but no wheezing or stridor.  Cor: Heart sounds normal w/ regular rate and rhythm without sig. murmurs, gallops, clicks, or rubs. Peripheral pulses normal and equal  without edema.  Abdomen: Soft & bowel sounds normal. Non-tender w/o guarding, rebound, hernias, masses, or organomegaly.  Lymphatics: Unremarkable.  Musculoskeletal: Full ROM all peripheral extremities, joint stability, 5/5 strength, and normal gait.  Skin: Warm, dry without exposed rashes, lesions or ecchymosis apparent.  Neuro: Cranial nerves intact, reflexes equal bilaterally. Sensory-motor testing grossly intact. Tendon reflexes grossly intact.  Pysch: Alert & oriented x 3.  Insight and judgement nl & appropriate. No ideations.  Assessment and Plan:  1. Essential hypertension  - TSH  2. Hyperlipidemia  - Lipid panel - TSH  3. T2_NIDDM w/ CKD 2  - Hemoglobin A1c - Insulin, random  4. Vitamin D deficiency  - VITAMIN D 25 Hydroxy   5. Idiopathic gout  - Uric acid  6. Testosterone deficiency   7. Acute pansinusitis   8. Tracheitis  - predniSONE (DELTASONE) 10 MG tablet; 1 tab 3 x day for 2 days, then 1 tab 2 x day for 2 days, then 1 tab 1 x day for 3 days  Dispense: 13 tablet; Refill: 0 - azithromycin (ZITHROMAX) 250 MG tablet; Take 2 tablets (500 mg) on  Day 1,  followed by 1 tablet (250 mg) once daily on Days 2 through 5.  Dispense: 6 each; Refill: 1 - promethazine-dextromethorphan (PROMETHAZINE-DM) 6.25-15 MG/5ML syrup; Take 1  to 2 tsp enery 4 hours if needed for cough  Dispense: 360 mL; Refill: 1  9. Medication management  - CBC with Differential/Platelet - BASIC METABOLIC PANEL WITH GFR - Hepatic function panel - Magnesium   Recommended regular exercise, BP monitoring, weight control, and discussed med and SE's. Recommended labs to assess and monitor clinical status. Further disposition pending results of labs. Over 30 minutes of exam, counseling, chart review was performed

## 2015-08-22 NOTE — Patient Instructions (Signed)

## 2015-08-24 ENCOUNTER — Encounter: Payer: Self-pay | Admitting: Internal Medicine

## 2015-08-24 ENCOUNTER — Ambulatory Visit (INDEPENDENT_AMBULATORY_CARE_PROVIDER_SITE_OTHER): Payer: BLUE CROSS/BLUE SHIELD | Admitting: Internal Medicine

## 2015-08-24 VITALS — BP 124/78 | HR 64 | Temp 97.7°F | Resp 16 | Ht 69.25 in | Wt 196.6 lb

## 2015-08-24 DIAGNOSIS — M1000 Idiopathic gout, unspecified site: Secondary | ICD-10-CM

## 2015-08-24 DIAGNOSIS — E559 Vitamin D deficiency, unspecified: Secondary | ICD-10-CM

## 2015-08-24 DIAGNOSIS — Z79899 Other long term (current) drug therapy: Secondary | ICD-10-CM | POA: Diagnosis not present

## 2015-08-24 DIAGNOSIS — N182 Chronic kidney disease, stage 2 (mild): Secondary | ICD-10-CM

## 2015-08-24 DIAGNOSIS — E291 Testicular hypofunction: Secondary | ICD-10-CM | POA: Diagnosis not present

## 2015-08-24 DIAGNOSIS — I10 Essential (primary) hypertension: Secondary | ICD-10-CM | POA: Diagnosis not present

## 2015-08-24 DIAGNOSIS — E349 Endocrine disorder, unspecified: Secondary | ICD-10-CM

## 2015-08-24 DIAGNOSIS — E1122 Type 2 diabetes mellitus with diabetic chronic kidney disease: Secondary | ICD-10-CM | POA: Diagnosis not present

## 2015-08-24 DIAGNOSIS — J0410 Acute tracheitis without obstruction: Secondary | ICD-10-CM

## 2015-08-24 DIAGNOSIS — J0140 Acute pansinusitis, unspecified: Secondary | ICD-10-CM | POA: Diagnosis not present

## 2015-08-24 DIAGNOSIS — E785 Hyperlipidemia, unspecified: Secondary | ICD-10-CM

## 2015-08-24 LAB — CBC WITH DIFFERENTIAL/PLATELET
BASOS PCT: 0 % (ref 0–1)
Basophils Absolute: 0 10*3/uL (ref 0.0–0.1)
EOS ABS: 0.2 10*3/uL (ref 0.0–0.7)
Eosinophils Relative: 2 % (ref 0–5)
HCT: 46.6 % (ref 39.0–52.0)
Hemoglobin: 16.4 g/dL (ref 13.0–17.0)
Lymphocytes Relative: 17 % (ref 12–46)
Lymphs Abs: 1.6 10*3/uL (ref 0.7–4.0)
MCH: 29 pg (ref 26.0–34.0)
MCHC: 35.2 g/dL (ref 30.0–36.0)
MCV: 82.3 fL (ref 78.0–100.0)
MONOS PCT: 7 % (ref 3–12)
MPV: 9 fL (ref 8.6–12.4)
Monocytes Absolute: 0.7 10*3/uL (ref 0.1–1.0)
NEUTROS PCT: 74 % (ref 43–77)
Neutro Abs: 7.1 10*3/uL (ref 1.7–7.7)
PLATELETS: 266 10*3/uL (ref 150–400)
RBC: 5.66 MIL/uL (ref 4.22–5.81)
RDW: 13.5 % (ref 11.5–15.5)
WBC: 9.6 10*3/uL (ref 4.0–10.5)

## 2015-08-24 LAB — HEPATIC FUNCTION PANEL
ALT: 33 U/L (ref 9–46)
AST: 26 U/L (ref 10–35)
Albumin: 4.3 g/dL (ref 3.6–5.1)
Alkaline Phosphatase: 66 U/L (ref 40–115)
BILIRUBIN DIRECT: 0.1 mg/dL (ref <=0.2)
BILIRUBIN TOTAL: 0.8 mg/dL (ref 0.2–1.2)
Indirect Bilirubin: 0.7 mg/dL (ref 0.2–1.2)
Total Protein: 7.3 g/dL (ref 6.1–8.1)

## 2015-08-24 LAB — BASIC METABOLIC PANEL WITH GFR
BUN: 20 mg/dL (ref 7–25)
CALCIUM: 9.7 mg/dL (ref 8.6–10.3)
CHLORIDE: 99 mmol/L (ref 98–110)
CO2: 25 mmol/L (ref 20–31)
CREATININE: 1.39 mg/dL — AB (ref 0.70–1.25)
GFR, EST AFRICAN AMERICAN: 61 mL/min (ref >=60)
GFR, Est Non African American: 53 mL/min — ABNORMAL LOW (ref >=60)
Glucose, Bld: 175 mg/dL — ABNORMAL HIGH (ref 65–99)
Potassium: 5.3 mmol/L (ref 3.5–5.3)
SODIUM: 137 mmol/L (ref 135–146)

## 2015-08-24 LAB — LIPID PANEL
CHOL/HDL RATIO: 6.6 ratio — AB (ref <=5.0)
CHOLESTEROL: 210 mg/dL — AB (ref 125–200)
HDL: 32 mg/dL — AB (ref >=40)
LDL Cholesterol: 137 mg/dL — ABNORMAL HIGH (ref <130)
TRIGLYCERIDES: 203 mg/dL — AB (ref <150)
VLDL: 41 mg/dL — ABNORMAL HIGH (ref <30)

## 2015-08-24 LAB — MAGNESIUM: MAGNESIUM: 1.8 mg/dL (ref 1.5–2.5)

## 2015-08-24 LAB — HEMOGLOBIN A1C
Hgb A1c MFr Bld: 8 % — ABNORMAL HIGH (ref <5.7)
Mean Plasma Glucose: 183 mg/dL — ABNORMAL HIGH (ref <117)

## 2015-08-24 LAB — TSH: TSH: 1.87 mIU/L (ref 0.40–4.50)

## 2015-08-24 LAB — URIC ACID: URIC ACID, SERUM: 6.6 mg/dL (ref 4.0–7.8)

## 2015-08-24 MED ORDER — PROMETHAZINE-DM 6.25-15 MG/5ML PO SYRP: ORAL_SOLUTION | ORAL | Status: DC

## 2015-08-24 MED ORDER — AZITHROMYCIN 250 MG PO TABS: ORAL_TABLET | ORAL | Status: DC

## 2015-08-24 MED ORDER — PREDNISONE 10 MG PO TABS: ORAL_TABLET | ORAL | Status: AC

## 2015-08-25 ENCOUNTER — Other Ambulatory Visit: Payer: Self-pay | Admitting: Internal Medicine

## 2015-08-25 DIAGNOSIS — E785 Hyperlipidemia, unspecified: Secondary | ICD-10-CM

## 2015-08-25 LAB — INSULIN, RANDOM: INSULIN: 12.4 u[IU]/mL (ref 2.0–19.6)

## 2015-08-25 LAB — VITAMIN D 25 HYDROXY (VIT D DEFICIENCY, FRACTURES): VIT D 25 HYDROXY: 42 ng/mL (ref 30–100)

## 2015-08-25 MED ORDER — ATORVASTATIN CALCIUM 80 MG PO TABS: ORAL_TABLET | ORAL | Status: DC

## 2015-09-07 ENCOUNTER — Other Ambulatory Visit: Payer: Self-pay | Admitting: Internal Medicine

## 2015-11-02 ENCOUNTER — Other Ambulatory Visit: Payer: Self-pay | Admitting: Internal Medicine

## 2015-11-02 MED ORDER — SCOPOLAMINE 1 MG/3DAYS TD PT72: 1.0000 | MEDICATED_PATCH | TRANSDERMAL | Status: DC

## 2015-11-20 ENCOUNTER — Other Ambulatory Visit: Payer: Self-pay | Admitting: Internal Medicine

## 2016-03-10 ENCOUNTER — Encounter: Payer: Self-pay | Admitting: Internal Medicine

## 2016-03-10 ENCOUNTER — Ambulatory Visit (INDEPENDENT_AMBULATORY_CARE_PROVIDER_SITE_OTHER): Payer: PPO | Admitting: Internal Medicine

## 2016-03-10 VITALS — BP 124/70 | HR 60 | Temp 97.3°F | Resp 16 | Ht 69.0 in | Wt 196.6 lb

## 2016-03-10 DIAGNOSIS — R6889 Other general symptoms and signs: Secondary | ICD-10-CM

## 2016-03-10 DIAGNOSIS — Z125 Encounter for screening for malignant neoplasm of prostate: Secondary | ICD-10-CM | POA: Diagnosis not present

## 2016-03-10 DIAGNOSIS — R972 Elevated prostate specific antigen [PSA]: Secondary | ICD-10-CM | POA: Diagnosis not present

## 2016-03-10 DIAGNOSIS — N182 Chronic kidney disease, stage 2 (mild): Secondary | ICD-10-CM | POA: Diagnosis not present

## 2016-03-10 DIAGNOSIS — E785 Hyperlipidemia, unspecified: Secondary | ICD-10-CM

## 2016-03-10 DIAGNOSIS — Z79899 Other long term (current) drug therapy: Secondary | ICD-10-CM | POA: Diagnosis not present

## 2016-03-10 DIAGNOSIS — E1122 Type 2 diabetes mellitus with diabetic chronic kidney disease: Secondary | ICD-10-CM

## 2016-03-10 DIAGNOSIS — I10 Essential (primary) hypertension: Secondary | ICD-10-CM | POA: Diagnosis not present

## 2016-03-10 DIAGNOSIS — Z1212 Encounter for screening for malignant neoplasm of rectum: Secondary | ICD-10-CM

## 2016-03-10 DIAGNOSIS — Z136 Encounter for screening for cardiovascular disorders: Secondary | ICD-10-CM

## 2016-03-10 DIAGNOSIS — M1000 Idiopathic gout, unspecified site: Secondary | ICD-10-CM

## 2016-03-10 DIAGNOSIS — E559 Vitamin D deficiency, unspecified: Secondary | ICD-10-CM | POA: Diagnosis not present

## 2016-03-10 DIAGNOSIS — Z0001 Encounter for general adult medical examination with abnormal findings: Secondary | ICD-10-CM | POA: Diagnosis not present

## 2016-03-10 DIAGNOSIS — E349 Endocrine disorder, unspecified: Secondary | ICD-10-CM

## 2016-03-10 LAB — LIPID PANEL
Cholesterol: 250 mg/dL — ABNORMAL HIGH (ref 125–200)
HDL: 32 mg/dL — AB (ref >=40)
LDL CALC: 153 mg/dL — AB (ref <130)
TRIGLYCERIDES: 324 mg/dL — AB (ref <150)
Total CHOL/HDL Ratio: 7.8 Ratio — ABNORMAL HIGH (ref <=5.0)
VLDL: 65 mg/dL — AB (ref <30)

## 2016-03-10 LAB — BASIC METABOLIC PANEL WITH GFR
BUN: 26 mg/dL — AB (ref 7–25)
CHLORIDE: 100 mmol/L (ref 98–110)
CO2: 24 mmol/L (ref 20–31)
Calcium: 9.7 mg/dL (ref 8.6–10.3)
Creat: 1.71 mg/dL — ABNORMAL HIGH (ref 0.70–1.25)
GFR, EST AFRICAN AMERICAN: 48 mL/min — AB (ref >=60)
GFR, EST NON AFRICAN AMERICAN: 41 mL/min — AB (ref >=60)
GLUCOSE: 161 mg/dL — AB (ref 65–99)
POTASSIUM: 5.4 mmol/L — AB (ref 3.5–5.3)
Sodium: 135 mmol/L (ref 135–146)

## 2016-03-10 LAB — CBC WITH DIFFERENTIAL/PLATELET
BASOS PCT: 1 %
Basophils Absolute: 66 cells/uL (ref 0–200)
EOS ABS: 198 {cells}/uL (ref 15–500)
Eosinophils Relative: 3 %
HEMATOCRIT: 46 % (ref 38.5–50.0)
HEMOGLOBIN: 16.1 g/dL (ref 13.2–17.1)
LYMPHS ABS: 1782 {cells}/uL (ref 850–3900)
Lymphocytes Relative: 27 %
MCH: 29 pg (ref 27.0–33.0)
MCHC: 35 g/dL (ref 32.0–36.0)
MCV: 82.7 fL (ref 80.0–100.0)
MONO ABS: 462 {cells}/uL (ref 200–950)
MPV: 9.2 fL (ref 7.5–12.5)
Monocytes Relative: 7 %
NEUTROS ABS: 4092 {cells}/uL (ref 1500–7800)
Neutrophils Relative %: 62 %
Platelets: 237 10*3/uL (ref 140–400)
RBC: 5.56 MIL/uL (ref 4.20–5.80)
RDW: 13.6 % (ref 11.0–15.0)
WBC: 6.6 10*3/uL (ref 3.8–10.8)

## 2016-03-10 LAB — HEPATIC FUNCTION PANEL
ALBUMIN: 4.8 g/dL (ref 3.6–5.1)
ALK PHOS: 48 U/L (ref 40–115)
ALT: 60 U/L — ABNORMAL HIGH (ref 9–46)
AST: 47 U/L — AB (ref 10–35)
BILIRUBIN INDIRECT: 0.7 mg/dL (ref 0.2–1.2)
BILIRUBIN TOTAL: 0.8 mg/dL (ref 0.2–1.2)
Bilirubin, Direct: 0.1 mg/dL (ref <=0.2)
TOTAL PROTEIN: 7.3 g/dL (ref 6.1–8.1)

## 2016-03-10 LAB — PSA: PSA: 1.7 ng/mL (ref <=4.0)

## 2016-03-10 LAB — URIC ACID: Uric Acid, Serum: 7.8 mg/dL (ref 4.0–8.0)

## 2016-03-10 LAB — TSH: TSH: 2.35 m[IU]/L (ref 0.40–4.50)

## 2016-03-10 LAB — MAGNESIUM: Magnesium: 2.1 mg/dL (ref 1.5–2.5)

## 2016-03-10 MED ORDER — GLIPIZIDE 5 MG PO TABS: ORAL_TABLET | ORAL | 1 refills | Status: DC

## 2016-03-10 NOTE — Progress Notes (Signed)
[facility]   Unk Pinto, M.D.    Uvaldo Bristle. Silverio Lay, P.A.-C      Starlyn Skeans, P.A.-C  [facility]                [address] [place] [id number] Telephone [phone] Telefax [phone] Annual  Screening/Preventative Visit  & Comprehensive Evaluation & Examination     This very nice 65 y.o. [place] presents for a Screening/Preventative Visit & comprehensive evaluation and management of multiple medical co-morbidities.  Patient has been followed for HTN, T2_NIDDM , Hyperlipidemia and Vitamin D Deficiency.     HTN predates circa 2000. Patient's BP has been controlled at home in the range 120-130's/60-70's.  Today's BP is  124/70. Patient denies any cardiac symptoms as chest pain, palpitations, shortness of breath, dizziness or ankle swelling.     Patient's hyperlipidemia is not controlled with diet and patient reports intolerance to Statins with Nausea, abdominal cramping, heartburn and diarrhea. Patient denies myalgias or other medication SE's. Last lipids were not at goal: Lab Results  Component Value Date   CHOL 210 (H) 08/24/2015   HDL 32 (L) 08/24/2015   LDLCALC 137 (H) 08/24/2015   TRIG 203 (H) 08/24/2015   CHOLHDL 6.6 (H) 08/24/2015      Patient has T2_NIDDM circa 2002  and patient denies reactive hypoglycemic symptoms, visual blurring, diabetic polys or paresthesias.  Patient is not monitoring CBG's as recommended> Last A1c was not at goal: Lab Results  Component Value Date   HGBA1C 8.0 (H) 08/24/2015       Finally, patient has history of Vitamin D Deficiency of  "14" in 2010 and last vitamin D was still very low:  Lab Results  Component Value Date   VD25OH 42 08/24/2015   Current Outpatient Prescriptions on File Prior to Visit  Medication Sig  . allopurinol  300 MG  Take  daily.  Marland Kitchen atorvastatin  80 MG  Not taking due to GI sx's  . BABY ASPIRIN 81 mg Take  daily.  .  bisoprolol-hctz ) 5-6.25  TAKE 1 TAB DAILY   . VITAMIN D 5,000 Units Take   daily.  . Enalapril 20 MG  TAKE 1 TAB DAILY.  . metFORMIN-XR 500 MG  Takes 1 tablet 4 day out of 5 days due to Diarrhea   Allergies  Allergen Reactions  . Crestor [Rosuvastatin]   . Welchol [Colesevelam Hcl]   . Zoloft [Sertraline Hcl]   . Penicillins Rash   Past Medical History:  Diagnosis Date  . Gout   . Hyperlipidemia   . Hypertension   . Other testicular hypofunction   . Type II or unspecified type diabetes mellitus without mention of complication, not stated as uncontrolled    Health Maintenance  Topic Date Due  . OPHTHALMOLOGY EXAM  03/01/1961  . COLONOSCOPY  03/01/2001  . INFLUENZA VACCINE  01/19/2016  . FOOT EXAM  02/16/2016  . HEMOGLOBIN A1C  02/24/2016  . PNA vac Low Risk Adult (1 of 2 - PCV13) 03/01/2016  . TETANUS/TDAP  12/26/2022  . ZOSTAVAX  Completed  . Hepatitis C Screening  Completed  . HIV Screening  Completed   Immunization History  Administered Date(s) Administered  . PPD Test 02/07/2014, 02/16/2015  . Pneumococcal Polysaccharide-23 11/01/2010  . Td 12/25/2012  . Zoster 12/25/2012   Past Surgical History:  Procedure Laterality Date  .  PROSTATE BIOPSY  2010  . WRIST SURGERY Right 1998   Family History  Problem Relation Age of Onset  . Heart disease Father   . Kidney disease Father   . Heart attack Father    Social History   Social History  . Marital status: Single   Occupational History  . Works at [employer]   Social History Main Topics  . Smoking status: Never Smoker  . Smokeless tobacco: Never Used  . Alcohol use No  . Drug use: No  . Sexual activity: Active    ROS Constitutional: Denies fever, chills, weight loss/gain, headaches, insomnia,  night sweats or change in appetite. Does c/o fatigue. Eyes: Denies redness, blurred vision, diplopia, discharge, itchy or watery eyes.  ENT: Denies discharge, congestion, post nasal drip, epistaxis,  sore throat, earache, hearing loss, dental pain, Tinnitus, Vertigo, Sinus pain or snoring.  Cardio: Denies chest pain, palpitations, irregular heartbeat, syncope, dyspnea, diaphoresis, orthopnea, PND, claudication or edema Respiratory: denies cough, dyspnea, DOE, pleurisy, hoarseness, laryngitis or wheezing.  Gastrointestinal: Denies dysphagia, heartburn, reflux, water brash, pain, cramps, nausea, vomiting, bloating, diarrhea, constipation, hematemesis, melena, hematochezia, jaundice or hemorrhoids Genitourinary: Denies dysuria, frequency, urgency, nocturia, hesitancy, discharge, hematuria or flank pain Musculoskeletal: Denies arthralgia, myalgia, stiffness, Jt. Swelling, pain, limp or strain/sprain. Denies Falls. Skin: Denies puritis, rash, hives, warts, acne, eczema or change in skin lesion Neuro: No weakness, tremor, incoordination, spasms, paresthesia or pain Psychiatric: Denies confusion, memory loss or sensory loss. Denies Depression. Endocrine: Denies change in weight, skin, hair change, nocturia, and paresthesia, diabetic polys, visual blurring or hyper / hypo glycemic episodes.  Heme/Lymph: No excessive bleeding, bruising or enlarged lymph nodes.  Physical Exam  BP 124/70   Pulse 60   Temp 97.3 F (36.3 C)   Resp 16   Ht 5\' 9"  (1.753 m)   Wt 196 lb 9.6 oz (89.2 kg)   BMI 29.03 kg/m   General Appearance: Well nourished, in no apparent distress.  Eyes: PERRLA, EOMs, conjunctiva no swelling or erythema, normal fundi and vessels. Sinuses: No frontal/maxillary tenderness ENT/Mouth: EACs patent / TMs  nl. Nares clear without erythema, swelling, mucoid exudates. Oral hygiene is good. No erythema, swelling, or exudate. Tongue normal, non-obstructing. Tonsils not swollen or erythematous. Hearing normal.  Neck: Supple, thyroid normal. No bruits, nodes or JVD. Respiratory: Respiratory effort normal.  BS equal and clear bilateral without rales, rhonci, wheezing or stridor. Cardio: Heart  sounds are normal with regular rate and rhythm and no murmurs, rubs or gallops. Peripheral pulses are normal and equal bilaterally without edema. No aortic or femoral bruits. Chest: symmetric with normal excursions and percussion.  Abdomen: Soft, with Nl bowel sounds. Nontender, no guarding, rebound, hernias, masses, or organomegaly.  Lymphatics: Non tender without lymphadenopathy.  Genitourinary: No hernias.Testes nl. DRE - prostate nl for age - smooth & firm w/o nodules. Musculoskeletal: Full ROM all peripheral extremities, joint stability, 5/5 strength, and normal gait. Skin: Warm and dry without rashes, lesions, cyanosis, clubbing or  ecchymosis.  Neuro: Cranial nerves intact, reflexes equal bilaterally. Normal muscle tone, no cerebellar symptoms. Sensation intact to touch, Vibratory & Monofilament to the toes bilaterally.Marland Kitchen  Pysch: Alert and oriented X 3 with normal affect, insight and judgment appropriate.   Assessment and Plan  1. Annual Preventative/Screening Exam   - Microalbumin / creatinine urine ratio - EKG 12-Lead - [place], RETROPERITNL ABD,  LTD - POC Hemoccult Bld/Stl  - Urinalysis, Routine w reflex microscopic  - PSA - HM DIABETES FOOT  EXAM - LOW EXTREMITY NEUR EXAM DOCUM - CBC with Differential/Platelet - BASIC METABOLIC PANEL WITH GFR - Hepatic function panel - Magnesium - Lipid panel - TSH - Hemoglobin A1c - Insulin, random - VITAMIN D 25 Hydroxy   2. Essential hypertension  - EKG 12-Lead - [place], RETROPERITNL ABD,  LTD - TSH  3. Hyperlipidemia  - EKG 12-Lead - [place], RETROPERITNL ABD,  LTD - Lipid panel - TSH  4. T2_NIDDM w/ CKD 2  - Microalbumin / creatinine urine ratio - EKG 12-Lead - [place], RETROPERITNL ABD,  LTD - HM DIABETES FOOT EXAM - LOW EXTREMITY NEUR EXAM DOCUM - Hemoglobin A1c - Insulin, random  - D/C Metformin due to Gi Intolerance - glipiZIDE (GLUCOTROL) 5 MG tablet; Take 1/2 to 1 tablet 3 x/day with meals for Diabetes  Dispense: 270 tablet;  Refill: 1  5. Vitamin D deficiency  - VITAMIN D 25 Hydroxy   6. Idiopathic gout  - Uric acid  7. Testosterone deficiency   8. Screening for rectal cancer  - POC Hemoccult Bld/Stl   9. Prostate cancer screening  - PSA  10. Elevated PSA  - PSA  11. Screening for ischemic heart disease  - EKG 12-Lead  12. Screening for AAA (aortic abdominal aneurysm)  - [place], RETROPERITNL ABD,  LTD  13. Medication management  - Urinalysis, Routine w reflex microscopic  - CBC with Differential/Platelet - BASIC METABOLIC PANEL WITH GFR - Hepatic function panel - Magnesium       Continue prudent diet as discussed, weight control, BP monitoring, regular exercise, and medications as discussed.  Discussed med effects and SE's. Routine screening labs and tests as requested with regular follow-up as recommended. Over 40 minutes of exam, counseling, chart review and high complex critical decision making was performed

## 2016-03-10 NOTE — Patient Instructions (Signed)

## 2016-03-11 LAB — URINALYSIS, ROUTINE W REFLEX MICROSCOPIC
Bilirubin Urine: NEGATIVE
GLUCOSE, UA: NEGATIVE
HGB URINE DIPSTICK: NEGATIVE
Ketones, ur: NEGATIVE
Leukocytes, UA: NEGATIVE
NITRITE: NEGATIVE
PH: 5 (ref 5.0–8.0)
Protein, ur: NEGATIVE
SPECIFIC GRAVITY, URINE: 1.014 (ref 1.001–1.035)

## 2016-03-11 LAB — HEMOGLOBIN A1C
Hgb A1c MFr Bld: 8 % — ABNORMAL HIGH (ref <5.7)
Mean Plasma Glucose: 183 mg/dL

## 2016-03-11 LAB — INSULIN, RANDOM: INSULIN: 8.8 u[IU]/mL (ref 2.0–19.6)

## 2016-03-11 LAB — VITAMIN D 25 HYDROXY (VIT D DEFICIENCY, FRACTURES): Vit D, 25-Hydroxy: 49 ng/mL (ref 30–100)

## 2016-03-11 LAB — MICROALBUMIN / CREATININE URINE RATIO
Creatinine, Urine: 152 mg/dL (ref 20–370)
Microalb Creat Ratio: 5 mcg/mg creat (ref <30)
Microalb, Ur: 0.7 mg/dL

## 2016-03-31 ENCOUNTER — Other Ambulatory Visit: Payer: Self-pay | Admitting: Internal Medicine

## 2016-03-31 DIAGNOSIS — E782 Mixed hyperlipidemia: Secondary | ICD-10-CM

## 2016-03-31 MED ORDER — ATORVASTATIN CALCIUM 80 MG PO TABS: ORAL_TABLET | ORAL | 5 refills | Status: DC

## 2016-05-22 ENCOUNTER — Other Ambulatory Visit: Payer: Self-pay | Admitting: Internal Medicine

## 2016-06-06 ENCOUNTER — Other Ambulatory Visit: Payer: Self-pay | Admitting: Internal Medicine

## 2016-06-21 ENCOUNTER — Ambulatory Visit (INDEPENDENT_AMBULATORY_CARE_PROVIDER_SITE_OTHER): Payer: PPO | Admitting: Internal Medicine

## 2016-06-21 ENCOUNTER — Encounter: Payer: Self-pay | Admitting: Internal Medicine

## 2016-06-21 VITALS — BP 116/74 | HR 66 | Temp 97.8°F | Resp 18 | Ht 69.0 in | Wt 202.0 lb

## 2016-06-21 DIAGNOSIS — E1122 Type 2 diabetes mellitus with diabetic chronic kidney disease: Secondary | ICD-10-CM

## 2016-06-21 DIAGNOSIS — E349 Endocrine disorder, unspecified: Secondary | ICD-10-CM

## 2016-06-21 DIAGNOSIS — Z79899 Other long term (current) drug therapy: Secondary | ICD-10-CM

## 2016-06-21 DIAGNOSIS — Z0001 Encounter for general adult medical examination with abnormal findings: Secondary | ICD-10-CM | POA: Diagnosis not present

## 2016-06-21 DIAGNOSIS — R6889 Other general symptoms and signs: Secondary | ICD-10-CM

## 2016-06-21 DIAGNOSIS — N182 Chronic kidney disease, stage 2 (mild): Secondary | ICD-10-CM | POA: Diagnosis not present

## 2016-06-21 DIAGNOSIS — M1000 Idiopathic gout, unspecified site: Secondary | ICD-10-CM

## 2016-06-21 DIAGNOSIS — Z0000 Encounter for general adult medical examination without abnormal findings: Secondary | ICD-10-CM

## 2016-06-21 DIAGNOSIS — E782 Mixed hyperlipidemia: Secondary | ICD-10-CM

## 2016-06-21 DIAGNOSIS — E559 Vitamin D deficiency, unspecified: Secondary | ICD-10-CM

## 2016-06-21 DIAGNOSIS — I10 Essential (primary) hypertension: Secondary | ICD-10-CM

## 2016-06-21 LAB — CBC WITH DIFFERENTIAL/PLATELET
BASOS ABS: 74 {cells}/uL (ref 0–200)
Basophils Relative: 1 %
EOS PCT: 3 %
Eosinophils Absolute: 222 cells/uL (ref 15–500)
HEMATOCRIT: 49.5 % (ref 38.5–50.0)
HEMOGLOBIN: 16.8 g/dL (ref 13.2–17.1)
LYMPHS ABS: 1850 {cells}/uL (ref 850–3900)
Lymphocytes Relative: 25 %
MCH: 28.7 pg (ref 27.0–33.0)
MCHC: 33.9 g/dL (ref 32.0–36.0)
MCV: 84.6 fL (ref 80.0–100.0)
MONO ABS: 592 {cells}/uL (ref 200–950)
MPV: 9.3 fL (ref 7.5–12.5)
Monocytes Relative: 8 %
NEUTROS ABS: 4662 {cells}/uL (ref 1500–7800)
NEUTROS PCT: 63 %
Platelets: 258 10*3/uL (ref 140–400)
RBC: 5.85 MIL/uL — AB (ref 4.20–5.80)
RDW: 13.5 % (ref 11.0–15.0)
WBC: 7.4 10*3/uL (ref 3.8–10.8)

## 2016-06-21 LAB — HEPATIC FUNCTION PANEL
ALBUMIN: 4.7 g/dL (ref 3.6–5.1)
ALK PHOS: 56 U/L (ref 40–115)
ALT: 53 U/L — AB (ref 9–46)
AST: 35 U/L (ref 10–35)
BILIRUBIN INDIRECT: 0.6 mg/dL (ref 0.2–1.2)
Bilirubin, Direct: 0.1 mg/dL (ref <=0.2)
TOTAL PROTEIN: 7.6 g/dL (ref 6.1–8.1)
Total Bilirubin: 0.7 mg/dL (ref 0.2–1.2)

## 2016-06-21 LAB — BASIC METABOLIC PANEL WITH GFR
BUN: 21 mg/dL (ref 7–25)
CHLORIDE: 101 mmol/L (ref 98–110)
CO2: 25 mmol/L (ref 20–31)
Calcium: 9.7 mg/dL (ref 8.6–10.3)
Creat: 1.38 mg/dL — ABNORMAL HIGH (ref 0.70–1.25)
GFR, EST NON AFRICAN AMERICAN: 53 mL/min — AB (ref >=60)
GFR, Est African American: 62 mL/min (ref >=60)
GLUCOSE: 191 mg/dL — AB (ref 65–99)
POTASSIUM: 5.1 mmol/L (ref 3.5–5.3)
SODIUM: 135 mmol/L (ref 135–146)

## 2016-06-21 LAB — LIPID PANEL
Cholesterol: 286 mg/dL — ABNORMAL HIGH (ref <200)
HDL: 35 mg/dL — AB (ref >40)
LDL CALC: 173 mg/dL — AB (ref <100)
Total CHOL/HDL Ratio: 8.2 Ratio — ABNORMAL HIGH (ref <5.0)
Triglycerides: 392 mg/dL — ABNORMAL HIGH (ref <150)
VLDL: 78 mg/dL — ABNORMAL HIGH (ref <30)

## 2016-06-21 LAB — TSH: TSH: 1.91 m[IU]/L (ref 0.40–4.50)

## 2016-06-21 MED ORDER — ALLOPURINOL 300 MG PO TABS: 300.0000 mg | ORAL_TABLET | Freq: Every day | ORAL | 1 refills | Status: DC

## 2016-06-21 MED ORDER — METFORMIN HCL ER 500 MG PO TB24: ORAL_TABLET | ORAL | 11 refills | Status: DC

## 2016-06-21 NOTE — Progress Notes (Signed)
MEDICARE ANNUAL WELLNESS VISIT AND FOLLOW UP Assessment:    1. Essential hypertension -well controlled -dash diet -exercise as tolerated -cont medications - TSH  2. Type 2 diabetes mellitus with diabetic chronic kidney disease, unspecified CKD stage, unspecified long term insulin use status ([place]) -discontinued glipizide -restart metformin -diet and exercise discussed -encouraged patient to update diabetic eye exam -also encouraged patient to check blood sugars and keep log - Hemoglobin A1c  3. T2_NIDDM w/ CKD 2 -see above - Hemoglobin A1c  4. Idiopathic gout, unspecified chronicity, unspecified site -cont allopurinol -check yearly uric acid  5. Mixed hyperlipidemia -recommended trying zetia -he preferred to try red yeast rice and also benefiber in coffee in the morning - Lipid panel  6. Vitamin D deficiency -cont Vit D  7. Medication management  - CBC with Differential/Platelet - BASIC METABOLIC PANEL WITH GFR - Hepatic function panel  8. Testosterone deficiency -not currently on replacement.   -increase exercise  9.  Hand nodule -possible early duprytrens contracture vs. Ganglion cyst  10.  Skin lesions -recommended Friday appointment for freezing of seborrheic keratoses -shin skin lesion will likely need excision and be sent to pathology.  Possible basal cell    Over 30 minutes of exam, counseling, chart review, and critical decision making was performed  Future Appointments Date Time Provider [place]  07/01/2016 11:15 AM Starlyn Skeans, PA-C [facility] None  09/23/2016 10:30 AM Unk Pinto, MD [facility] None  04/12/2017 10:00 AM Unk Pinto, MD [facility] None     Plan:   During the course of the visit the patient was educated and counseled about appropriate screening and preventive services including:    Pneumococcal vaccine   Influenza vaccine  Prevnar 13  Td vaccine  Screening electrocardiogram  Colorectal cancer  screening  Diabetes screening  Glaucoma screening  Nutrition counseling    Subjective:  [name] is a 66 y.o. male who presents for Medicare Annual Wellness Visit and 3 month follow up for HTN, hyperlipidemia, prediabetes, and vitamin D Def.   His blood pressure has been controlled at home, today their BP is BP: 116/74 He does not workout. He denies chest pain, shortness of breath, dizziness.  He is not on cholesterol medication and denies myalgias. His cholesterol is not at goal. The cholesterol last visit was:   Lab Results  Component Value Date   CHOL 250 (H) 03/10/2016   HDL 32 (L) 03/10/2016   LDLCALC 153 (H) 03/10/2016   TRIG 324 (H) 03/10/2016   CHOLHDL 7.8 (H) 03/10/2016   Patient reports that he has not been checking his blood sugars.  He reports that he has also not been taking glipizide.  He reports that he does not want to take the medication due to listed side effects. Patient reports that he would rather go back to metfromin.  He will tolerate the nausea.     Lab Results  Component Value Date   HGBA1C 8.0 (H) 03/10/2016   Last GFR Lab Results  Component Value Date   GFRNONAA 41 (L) 03/10/2016     Lab Results  Component Value Date   GFRAA 48 (L) 03/10/2016   Patient is on Vitamin D supplement.   Lab Results  Component Value Date   VD25OH 49 03/10/2016      Patient reports that he has a nodule on the middle finger of his hand. It is mildly tender to palpation.    He reports that he has some places on his skin that he would  like to get rid of.  He reports that he would like to have some of the places taken off.  He thinks that he also has a place on his left leg he would like taken off.    Medication Review: Current Outpatient Prescriptions on File Prior to Visit  Medication Sig Dispense Refill  . BABY ASPIRIN PO Take 81 mg by mouth daily.    . bisoprolol-hydrochlorothiazide (ZIAC) 5-6.25 MG tablet TAKE 1 TABLET BY MOUTH DAILY FOR BLOOD PRESSURE 90  tablet 1  . Cholecalciferol (VITAMIN D PO) Take 5,000 Int'l Units by mouth daily.    . enalapril (VASOTEC) 20 MG tablet TAKE ONE TABLET BY MOUTH DAILY 90 tablet 0   No current facility-administered medications on file prior to visit.     Allergies: Allergies  Allergen Reactions  . Crestor [Rosuvastatin]   . Welchol [Colesevelam Hcl]   . Zoloft [Sertraline Hcl]   . Penicillins Rash    Current Problems (verified) has Hyperlipidemia; Hypertension; T2_NIDDM w/ CKD 2; Testosterone deficiency; Gout; Vitamin D deficiency; Medication management; and T2_NIDDM on his problem list.  Screening Tests Immunization History  Administered Date(s) Administered  . PPD Test 02/07/2014, 02/16/2015  . Pneumococcal Polysaccharide-23 11/01/2010  . Td 12/25/2012  . Zoster 12/25/2012    Preventative care: Last colonoscopy: Cologuard ordered  Prior vaccinations: TD or Tdap: 2014  Influenza: Declined  Pneumococcal: 2012, Prevnar13: Due, but patient would like to wait for another time.   Shingles/Zostavax: 2014  Names of Other Physician/Practitioners you currently use: 1. [facility] here for primary care 2. Dr. Herbert Deaner, eye doctor, last visit 2015  3. Patient unaware of name, dentist, last visit 2017  Patient Care Team: Unk Pinto, MD as PCP - General (Internal Medicine)  Surgical: He  has a past surgical history that includes Wrist surgery (Right, 1998) and Prostate biopsy (2010). Family His family history includes Heart attack in his father; Heart disease in his father; Kidney disease in his father. Social history  He reports that he has never smoked. He has never used smokeless tobacco. He reports that he does not drink alcohol or use drugs.  MEDICARE WELLNESS OBJECTIVES: Physical activity: Current Exercise Habits: The patient has a physically strenous job, but has no regular exercise apart from work., Time (Minutes): 45, Frequency (Times/Week):  4, Weekly Exercise (Minutes/Week): 180, Intensity: Mild Cardiac risk factors: Cardiac Risk Factors include: advanced age (>74men, >53 women);diabetes mellitus;dyslipidemia;hypertension;obesity (BMI >30kg/m2);sedentary lifestyle;male gender Depression/mood screen:   Depression screen [facility] 2/9 06/21/2016  Decreased Interest 0  Down, Depressed, Hopeless 0  PHQ - 2 Score 0    ADLs:  In your present state of health, do you have any difficulty performing the following activities: 06/21/2016 03/10/2016  Hearing? N N  Vision? N N  Difficulty concentrating or making decisions? N N  Walking or climbing stairs? N N  Dressing or bathing? N N  Doing errands, shopping? N N  Preparing Food and eating ? N -  Using the Toilet? N -  In the past six months, have you accidently leaked urine? N -  Do you have problems with loss of bowel control? N -  Managing your Medications? N -  Managing your Finances? N -  Housekeeping or managing your Housekeeping? N -  Some recent data might be hidden     Cognitive Testing  Alert? Yes  Normal Appearance?Yes  Oriented to person? Yes  Place? Yes   Time? Yes  Recall of three objects?  Yes  Can perform simple calculations? Yes  Displays appropriate judgment?Yes  Can read the correct time from a watch face?Yes  EOL planning: Does Patient Have a Medical Advance Directive?: No Would patient like information on creating a medical advance directive?: Yes (MAU/Ambulatory/Procedural Areas - Information given)   Objective:   Today's Vitals   06/21/16 1033  BP: 116/74  Pulse: 66  Resp: 18  Temp: 97.8 F (36.6 C)  TempSrc: Temporal  Weight: 202 lb (91.6 kg)  Height: 5\' 9"  (1.753 m)   Body mass index is 29.83 kg/m.  General appearance: alert, no distress, WD/WN, male HEENT: normocephalic, sclerae anicteric, TMs pearly, nares patent, no discharge or erythema, pharynx normal Oral cavity: MMM, no lesions Neck: supple, no lymphadenopathy, no thyromegaly, no  masses Heart: RRR, normal S1, S2, no murmurs Lungs: CTA bilaterally, no wheezes, rhonchi, or rales Abdomen: +bs, soft, non tender, non distended, no masses, no hepatomegaly, no splenomegaly Musculoskeletal: nontender, no swelling, no obvious deformity Extremities: no edema, no cyanosis, no clubbing Pulses: 2+ symmetric, upper and lower extremities, normal cap refill Neurological: alert, oriented x 3, CN2-12 intact, strength normal upper extremities and lower extremities, sensation normal throughout, DTRs 2+ throughout, no cerebellar signs, gait normal Skin:  Seborrheic keratoses on the left temple region of the face.  A single seborrheic keratosis below the left eye.  1 cm verrucous and dry lesion to the medial left shin.   Psychiatric: normal affect, behavior normal, pleasant   Medicare Attestation I have personally reviewed: The patient's medical and social history Their use of alcohol, tobacco or illicit drugs Their current medications and supplements The patient's functional ability including ADLs,fall risks, home safety risks, cognitive, and hearing and visual impairment Diet and physical activities Evidence for depression or mood disorders  The patient's weight, height, BMI, and visual acuity have been recorded in the chart.  I have made referrals, counseling, and provided education to the patient based on review of the above and I have provided the patient with a written personalized care plan for preventive services.     Starlyn Skeans, PA-C   06/21/2016

## 2016-06-21 NOTE — Patient Instructions (Signed)
Please try taking 1200 mg of Red Yeast Rice daily.  Please also start mixing benefiber into your coffee daily to see if this will help with your cholesterol.  If this doesn't work we will try the medication zetia.  Metformin and allopurinol should be waiting for you at the pharmacy.

## 2016-06-22 LAB — HEMOGLOBIN A1C
Hgb A1c MFr Bld: 8.3 % — ABNORMAL HIGH (ref <5.7)
MEAN PLASMA GLUCOSE: 192 mg/dL

## 2016-07-01 ENCOUNTER — Encounter: Payer: Self-pay | Admitting: Internal Medicine

## 2016-07-22 ENCOUNTER — Encounter: Payer: Self-pay | Admitting: Internal Medicine

## 2016-07-29 ENCOUNTER — Encounter: Payer: Self-pay | Admitting: Internal Medicine

## 2016-07-29 ENCOUNTER — Ambulatory Visit (INDEPENDENT_AMBULATORY_CARE_PROVIDER_SITE_OTHER): Payer: PPO | Admitting: Internal Medicine

## 2016-07-29 VITALS — BP 134/80 | HR 66 | Temp 98.0°F | Resp 16 | Ht 69.0 in | Wt 199.0 lb

## 2016-07-29 DIAGNOSIS — L989 Disorder of the skin and subcutaneous tissue, unspecified: Secondary | ICD-10-CM

## 2016-07-29 DIAGNOSIS — L821 Other seborrheic keratosis: Secondary | ICD-10-CM | POA: Diagnosis not present

## 2016-07-29 DIAGNOSIS — L820 Inflamed seborrheic keratosis: Secondary | ICD-10-CM | POA: Diagnosis not present

## 2016-07-29 MED ORDER — DESONIDE 0.05 % EX OINT: 1.0000 "application " | TOPICAL_OINTMENT | Freq: Two times a day (BID) | CUTANEOUS | 0 refills | Status: DC

## 2016-07-29 MED ORDER — PREDNISONE 20 MG PO TABS: ORAL_TABLET | ORAL | 0 refills | Status: DC

## 2016-07-29 NOTE — Progress Notes (Signed)
Excisional Biopsy Procedure Note  Pre-operative Diagnosis: Suspicious lesion  Post-operative Diagnosis: same  Locations: Left leg lesion  Indications: Scabbing and healing lesion in sun exposed area  Anesthesia: Bupivacaine 0.25% with epinephrine with added sodium bicarbonate  Procedure Details  The risks, benefits, indications, potential complications, and alternatives were explained to the patient and informed consent obtained.  The lesion and surrounding area was given a sterile prep using alcohol and draped in the usual sterile fashion. A scalpel was used to excise an elliptical area of skin approximately 1cm by 2cm. The wound was closed with 3-0 Nylon using simple interrupted stitches. Antibiotic ointment and a sterile dressing applied. The specimen was sent for pathologic examination. The patient tolerated the procedure well.  EBL: minimal  Findings: suspicious skin lesion  Condition: Stable  Complications:  None  Plan: 1. Instructed to keep the wound dry and covered for 24-48 hours and clean thereafter. 2. Warning signs of infection were reviewed.   3. Recommended that the patient use OTC analgesics as needed for pain.  4. Return for suture removal in 1 week.  Patient also had roughly 20 small seborrheic keratoses cryo destroyed on the face, abdomen and right leg.  Patient tolerated all procedures well.  He will return in 1 week for suture removal.

## 2016-07-29 NOTE — Addendum Note (Signed)
Addended by: Starlyn Skeans A on: 07/29/2016 01:51 PM   Modules accepted: Orders

## 2016-08-04 ENCOUNTER — Ambulatory Visit (INDEPENDENT_AMBULATORY_CARE_PROVIDER_SITE_OTHER): Payer: PPO | Admitting: Internal Medicine

## 2016-08-04 DIAGNOSIS — L989 Disorder of the skin and subcutaneous tissue, unspecified: Secondary | ICD-10-CM | POA: Diagnosis not present

## 2016-08-05 NOTE — Progress Notes (Signed)
Subjective:    [name] is a 66 y.o. male who had left shin skin biopsy 6 day ago, which required closure with 3 sutures. He denies pain, redness, or drainage from the wound.    Review of Systems A comprehensive review of systems was negative.    Objective:  Injury exam:  A 1 cm laceration noted on the left shin is healing well, without evidence of infection. Pathology results were reviewed with patient and were negative for malignancy   Assessment:    Laceration is healing well, without evidence of infection.    Plan:     1. 3 sutures were removed. 2. Wound care discussed. 3. Follow up as needed.

## 2016-09-15 ENCOUNTER — Other Ambulatory Visit: Payer: Self-pay | Admitting: Internal Medicine

## 2016-09-23 ENCOUNTER — Ambulatory Visit: Payer: Self-pay | Admitting: Internal Medicine

## 2016-10-25 ENCOUNTER — Ambulatory Visit: Payer: Self-pay | Admitting: Internal Medicine

## 2016-12-13 ENCOUNTER — Ambulatory Visit (INDEPENDENT_AMBULATORY_CARE_PROVIDER_SITE_OTHER): Payer: PPO | Admitting: Internal Medicine

## 2016-12-13 ENCOUNTER — Encounter: Payer: Self-pay | Admitting: Internal Medicine

## 2016-12-13 VITALS — BP 136/70 | HR 55 | Temp 97.5°F | Resp 16 | Ht 69.0 in | Wt 192.0 lb

## 2016-12-13 DIAGNOSIS — I10 Essential (primary) hypertension: Secondary | ICD-10-CM | POA: Diagnosis not present

## 2016-12-13 DIAGNOSIS — E782 Mixed hyperlipidemia: Secondary | ICD-10-CM | POA: Diagnosis not present

## 2016-12-13 DIAGNOSIS — N182 Chronic kidney disease, stage 2 (mild): Secondary | ICD-10-CM

## 2016-12-13 DIAGNOSIS — E1122 Type 2 diabetes mellitus with diabetic chronic kidney disease: Secondary | ICD-10-CM | POA: Diagnosis not present

## 2016-12-13 DIAGNOSIS — M1000 Idiopathic gout, unspecified site: Secondary | ICD-10-CM

## 2016-12-13 DIAGNOSIS — E559 Vitamin D deficiency, unspecified: Secondary | ICD-10-CM | POA: Diagnosis not present

## 2016-12-13 DIAGNOSIS — Z79899 Other long term (current) drug therapy: Secondary | ICD-10-CM

## 2016-12-13 LAB — CBC WITH DIFFERENTIAL/PLATELET
Basophils Absolute: 69 cells/uL (ref 0–200)
Basophils Relative: 1 %
Eosinophils Absolute: 276 cells/uL (ref 15–500)
Eosinophils Relative: 4 %
HEMATOCRIT: 45.8 % (ref 38.5–50.0)
HEMOGLOBIN: 15.4 g/dL (ref 13.2–17.1)
LYMPHS ABS: 2001 {cells}/uL (ref 850–3900)
LYMPHS PCT: 29 %
MCH: 27.9 pg (ref 27.0–33.0)
MCHC: 33.6 g/dL (ref 32.0–36.0)
MCV: 83 fL (ref 80.0–100.0)
MONO ABS: 552 {cells}/uL (ref 200–950)
MPV: 9.1 fL (ref 7.5–12.5)
Monocytes Relative: 8 %
NEUTROS PCT: 58 %
Neutro Abs: 4002 cells/uL (ref 1500–7800)
Platelets: 233 10*3/uL (ref 140–400)
RBC: 5.52 MIL/uL (ref 4.20–5.80)
RDW: 13.5 % (ref 11.0–15.0)
WBC: 6.9 10*3/uL (ref 3.8–10.8)

## 2016-12-13 LAB — URIC ACID: Uric Acid, Serum: 8.9 mg/dL — ABNORMAL HIGH (ref 4.0–8.0)

## 2016-12-13 LAB — BASIC METABOLIC PANEL WITH GFR
BUN: 21 mg/dL (ref 7–25)
CALCIUM: 10 mg/dL (ref 8.6–10.3)
CO2: 26 mmol/L (ref 20–31)
Chloride: 102 mmol/L (ref 98–110)
Creat: 1.47 mg/dL — ABNORMAL HIGH (ref 0.70–1.25)
GFR, EST AFRICAN AMERICAN: 57 mL/min — AB (ref >=60)
GFR, EST NON AFRICAN AMERICAN: 49 mL/min — AB (ref >=60)
GLUCOSE: 164 mg/dL — AB (ref 65–99)
POTASSIUM: 4.3 mmol/L (ref 3.5–5.3)
Sodium: 137 mmol/L (ref 135–146)

## 2016-12-13 LAB — HEPATIC FUNCTION PANEL
ALBUMIN: 4.5 g/dL (ref 3.6–5.1)
ALK PHOS: 50 U/L (ref 40–115)
ALT: 39 U/L (ref 9–46)
AST: 30 U/L (ref 10–35)
BILIRUBIN INDIRECT: 0.5 mg/dL (ref 0.2–1.2)
Bilirubin, Direct: 0.1 mg/dL (ref <=0.2)
TOTAL PROTEIN: 7.1 g/dL (ref 6.1–8.1)
Total Bilirubin: 0.6 mg/dL (ref 0.2–1.2)

## 2016-12-13 LAB — MAGNESIUM: Magnesium: 1.9 mg/dL (ref 1.5–2.5)

## 2016-12-13 LAB — LIPID PANEL
CHOL/HDL RATIO: 7.4 ratio — AB (ref <5.0)
Cholesterol: 208 mg/dL — ABNORMAL HIGH (ref <200)
HDL: 28 mg/dL — AB (ref >40)
LDL CALC: 104 mg/dL — AB (ref <100)
TRIGLYCERIDES: 380 mg/dL — AB (ref <150)
VLDL: 76 mg/dL — AB (ref <30)

## 2016-12-13 LAB — TSH: TSH: 1.85 mIU/L (ref 0.40–4.50)

## 2016-12-13 NOTE — Progress Notes (Signed)
This very nice 66 y.o. [place] presents for 3 month follow up with Hypertension, Hyperlipidemia, Pre-Diabetes and Vitamin D Deficiency.      Patient is treated for HTN(2000) & BP has been controlled at home. Today's BP is at goal - 136/70. Patient has had no complaints of any cardiac type chest pain, palpitations, dyspnea/orthopnea/PND, dizziness, claudication, or dependent edema.     Hyperlipidemia is not controlled with diet & he has hx/o intolerance to Statins & Welchol. Last Lipids were not at goal: Lab Results  Component Value Date   CHOL 286 (H) 06/21/2016   HDL 35 (L) 06/21/2016   LDLCALC 173 (H) 06/21/2016   TRIG 392 (H) 06/21/2016   CHOLHDL 8.2 (H) 06/21/2016      Also, the patient has history of T2_NIDDM (2002) and has had no symptoms of reactive hypoglycemia, diabetic polys, paresthesias or visual blurring.  Dietary compliance admittedly has been poor and last A1c was not at goal: Lab Results  Component Value Date   HGBA1C 8.3 (H) 06/21/2016      Further, the patient also has history of Vitamin D Deficiency ("14" in 2010) and supplements vitamin D without any suspected side-effects. Last vitamin D was still sl low (goal 70-100):  Lab Results  Component Value Date   VD25OH 49 03/10/2016   Current Outpatient Prescriptions on File Prior to Visit  Medication Sig  . allopurinol (ZYLOPRIM) 300 MG tablet Take 1 tablet (300 mg total) by mouth daily.  Marland Kitchen BABY ASPIRIN PO Take 81 mg by mouth daily.  . bisoprolol-hydrochlorothiazide (ZIAC) 5-6.25 MG tablet TAKE 1 TABLET BY MOUTH DAILY FOR BLOOD PRESSURE  . Cholecalciferol (VITAMIN D PO) Take 5,000 Int'l Units by mouth daily.  . enalapril (VASOTEC) 20 MG tablet TAKE ONE TABLET BY MOUTH DAILY  . metFORMIN (GLUCOPHAGE XR) 500 MG 24 hr tablet Take 1 tablet with breakfast, take 1 tablet with lunch and take 2 tablets with dinner.   No current facility-administered medications on file prior to visit.    Allergies  Allergen Reactions  .  Crestor [Rosuvastatin]   . Welchol [Colesevelam Hcl]   . Zoloft [Sertraline Hcl]   . Penicillins Rash   PMHx:   Past Medical History:  Diagnosis Date  . Gout   . Hyperlipidemia   . Hypertension   . Other testicular hypofunction   . Type II or unspecified type diabetes mellitus without mention of complication, not stated as uncontrolled    Immunization History  Administered Date(s) Administered  . PPD Test 02/07/2014, 02/16/2015  . Pneumococcal Polysaccharide-23 11/01/2010  . Td 12/25/2012  . Zoster 12/25/2012   Past Surgical History:  Procedure Laterality Date  . PROSTATE BIOPSY  2010  . WRIST SURGERY Right 1998   FHx:    Reviewed / unchanged  SHx:    Reviewed / unchanged  Systems Review:  Constitutional: Denies fever, chills, wt changes, headaches, insomnia, fatigue, night sweats, change in appetite. Eyes: Denies redness, blurred vision, diplopia, discharge, itchy, watery eyes.  ENT: Denies discharge, congestion, post nasal drip, epistaxis, sore throat, earache, hearing loss, dental pain, tinnitus, vertigo, sinus pain, snoring.  CV: Denies chest pain, palpitations, irregular heartbeat, syncope, dyspnea, diaphoresis, orthopnea, PND, claudication or edema. Respiratory: denies cough, dyspnea, DOE, pleurisy, hoarseness, laryngitis, wheezing.  Gastrointestinal: Denies dysphagia, odynophagia, heartburn, reflux, water brash, abdominal pain or cramps, nausea, vomiting, bloating, diarrhea, constipation, hematemesis, melena, hematochezia  or hemorrhoids. Genitourinary: Denies dysuria, frequency, urgency, nocturia, hesitancy, discharge, hematuria or flank pain. Musculoskeletal: Denies arthralgias,  myalgias, stiffness, jt. swelling, pain, limping or strain/sprain.  Skin: Denies pruritus, rash, hives, warts, acne, eczema or change in skin lesion(s). Neuro: No weakness, tremor, incoordination, spasms, paresthesia or pain. Psychiatric: Denies confusion, memory loss or sensory loss. Endo:  Denies change in weight, skin or hair change.  Heme/Lymph: No excessive bleeding, bruising or enlarged lymph nodes.  Physical Exam  BP 136/70   Pulse (!) 55   Temp 97.5 F (36.4 C)   Resp 16   Ht 5\' 9"  (1.753 m)   Wt 192 lb (87.1 kg)   BMI 28.35 kg/m   Appears well nourished, well groomed  and in no distress.  Eyes: PERRLA, EOMs, conjunctiva no swelling or erythema. Sinuses: No frontal/maxillary tenderness ENT/Mouth: EAC's clear, TM's nl w/o erythema, bulging. Nares clear w/o erythema, swelling, exudates. Oropharynx clear without erythema or exudates. Oral hygiene is good. Tongue normal, non obstructing. Hearing intact.  Neck: Supple. Thyroid nl. Car 2+/2+ without bruits, nodes or JVD. Chest: Respirations nl with BS clear & equal w/o rales, rhonchi, wheezing or stridor.  Cor: Heart sounds normal w/ regular rate and rhythm without sig. murmurs, gallops, clicks or rubs. Peripheral pulses normal and equal  without edema.  Abdomen: Soft & bowel sounds normal. Non-tender w/o guarding, rebound, hernias, masses or organomegaly.  Lymphatics: Unremarkable.  Musculoskeletal: Full ROM all peripheral extremities, joint stability, 5/5 strength and normal gait.  Skin: Warm, dry without exposed rashes, lesions or ecchymosis apparent.  Neuro: Cranial nerves intact, reflexes equal bilaterally. Sensory-motor testing grossly intact. Tendon reflexes grossly intact.  Pysch: Alert & oriented x 3.  Insight and judgement nl & appropriate. No ideations.  Assessment and Plan:  1. Essential hypertension  - Continue medication, monitor blood pressure at home.  - Continue DASH diet. Reminder to go to the ER if any CP,  SOB, nausea, dizziness, severe HA, changes vision/speech  - CBC with Differential/Platelet - BASIC METABOLIC PANEL WITH GFR - Magnesium - TSH  2. Hyperlipidemia, mixed  - Continue diet/meds, exercise,& lifestyle modifications.  - Continue monitor periodic cholesterol/liver & renal  functions   - Hepatic function panel - Lipid panel - TSH  3. T2_NIDDM w/ CKD 2  - Continue diet, exercise, lifestyle modifications.  - Monitor appropriate labs.  - Hemoglobin A1c - Insulin, random  4. Vitamin D deficiency  - Continue supplementation.  - VITAMIN D 25 Hydroxy   5. Idiopathic gout  - Uric acid  6. Medication management  - CBC with Differential/Platelet - BASIC METABOLIC PANEL WITH GFR - Hepatic function panel - Magnesium - Lipid panel - TSH - Hemoglobin A1c - Insulin, random - VITAMIN D 25 Hydroxy  - Uric acid       Discussed  regular exercise, BP monitoring, weight control to achieve/maintain BMI less than 25 and discussed med and SE's. Recommended labs to assess and monitor clinical status with further disposition pending results of labs. Over 30 minutes of exam, counseling, chart review was performed.

## 2016-12-13 NOTE — Patient Instructions (Signed)

## 2016-12-14 LAB — VITAMIN D 25 HYDROXY (VIT D DEFICIENCY, FRACTURES): Vit D, 25-Hydroxy: 45 ng/mL (ref 30–100)

## 2016-12-14 LAB — INSULIN, RANDOM: Insulin: 21.3 u[IU]/mL — ABNORMAL HIGH (ref 2.0–19.6)

## 2016-12-14 LAB — HEMOGLOBIN A1C
Hgb A1c MFr Bld: 7.8 % — ABNORMAL HIGH (ref <5.7)
Mean Plasma Glucose: 177 mg/dL

## 2017-01-12 ENCOUNTER — Other Ambulatory Visit: Payer: Self-pay | Admitting: Internal Medicine

## 2017-01-24 ENCOUNTER — Other Ambulatory Visit: Payer: Self-pay | Admitting: *Deleted

## 2017-01-24 MED ORDER — COLCHICINE 0.6 MG PO TABS: ORAL_TABLET | ORAL | 1 refills | Status: DC

## 2017-01-24 MED ORDER — PREDNISONE 10 MG PO TABS: ORAL_TABLET | ORAL | 0 refills | Status: DC

## 2017-04-12 ENCOUNTER — Ambulatory Visit (INDEPENDENT_AMBULATORY_CARE_PROVIDER_SITE_OTHER): Payer: PPO | Admitting: Internal Medicine

## 2017-04-12 VITALS — BP 132/80 | HR 56 | Temp 97.3°F | Resp 18 | Ht 69.0 in | Wt 184.6 lb

## 2017-04-12 DIAGNOSIS — I10 Essential (primary) hypertension: Secondary | ICD-10-CM | POA: Diagnosis not present

## 2017-04-12 DIAGNOSIS — Z23 Encounter for immunization: Secondary | ICD-10-CM

## 2017-04-12 DIAGNOSIS — N183 Chronic kidney disease, stage 3 unspecified: Secondary | ICD-10-CM

## 2017-04-12 DIAGNOSIS — Z0000 Encounter for general adult medical examination without abnormal findings: Secondary | ICD-10-CM | POA: Diagnosis not present

## 2017-04-12 DIAGNOSIS — E782 Mixed hyperlipidemia: Secondary | ICD-10-CM

## 2017-04-12 DIAGNOSIS — E1122 Type 2 diabetes mellitus with diabetic chronic kidney disease: Secondary | ICD-10-CM | POA: Diagnosis not present

## 2017-04-12 DIAGNOSIS — Z136 Encounter for screening for cardiovascular disorders: Secondary | ICD-10-CM | POA: Diagnosis not present

## 2017-04-12 DIAGNOSIS — R972 Elevated prostate specific antigen [PSA]: Secondary | ICD-10-CM

## 2017-04-12 DIAGNOSIS — Z794 Long term (current) use of insulin: Secondary | ICD-10-CM

## 2017-04-12 DIAGNOSIS — E559 Vitamin D deficiency, unspecified: Secondary | ICD-10-CM

## 2017-04-12 DIAGNOSIS — Z1212 Encounter for screening for malignant neoplasm of rectum: Secondary | ICD-10-CM

## 2017-04-12 DIAGNOSIS — Z125 Encounter for screening for malignant neoplasm of prostate: Secondary | ICD-10-CM

## 2017-04-12 DIAGNOSIS — Z79899 Other long term (current) drug therapy: Secondary | ICD-10-CM

## 2017-04-12 DIAGNOSIS — Z0001 Encounter for general adult medical examination with abnormal findings: Secondary | ICD-10-CM

## 2017-04-12 DIAGNOSIS — M1000 Idiopathic gout, unspecified site: Secondary | ICD-10-CM

## 2017-04-12 NOTE — Progress Notes (Signed)
ADULT & ADOLESCENT INTERNAL MEDICINE   Unk Pinto, M.D.     Uvaldo Bristle. Silverio Lay, P.A.-C Liane Comber, [place]                [address] [place], [place] [address] Telephone [phone] Telefax [phone] Annual  Screening/Preventative Visit  & Comprehensive Evaluation & Examination     This very nice 66 y.o. WWM presents for a Screening/Preventative Visit & comprehensive evaluation and management of multiple medical co-morbidities.  Patient has been followed for HTN, T2_NIDDM, Hyperlipidemia and Vitamin D Deficiency. Patient's gout is controlled w/Allopurinol:     HTN predates since 2000. Patient's BP has been controlled at home.  Today's BP is at goal - 132/80. Patient denies any cardiac symptoms as chest pain, palpitations, shortness of breath, dizziness or ankle swelling.     Patient's hyperlipidemia is controlled with diet and medications. Patient denies myalgias or other medication SE's. 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 Last A1c was not at goal: Lab Results  Component Value Date   HGBA1C 7.8 (H) 12/13/2016       Finally, patient has history of Vitamin D Deficiency of "14"/2010  and last vitamin D was still not at goal (70-100): Lab Results  Component Value Date   VD25OH 45 12/13/2016   Current Outpatient Prescriptions on File Prior to Visit  Medication Sig  . allopurinol (ZYLOPRIM) 300 MG tablet Take 1 tablet (300 mg total) by mouth daily.  Marland Kitchen BABY ASPIRIN PO Take 81 mg by mouth daily.  . bisoprolol-hydrochlorothiazide (ZIAC) 5-6.25 MG tablet TAKE ONE  TABLET BY MOUTH DAILY FOR BLOOD PRESSURE  . Cholecalciferol (VITAMIN D PO) Take 5,000 Int'l Units by mouth daily.  . colchicine 0.6 MG tablet Take 1 to 2 tablets daily for gout attack and PRN.  . enalapril (VASOTEC) 20 MG tablet TAKE ONE TABLET BY MOUTH DAILY  . metFORMIN (GLUCOPHAGE XR) 500 MG 24 hr tablet Take 1 tablet with breakfast, take 1 tablet with lunch and take 2 tablets with dinner.   No current facility-administered medications on file prior to visit.    Allergies  Allergen Reactions  . Crestor [Rosuvastatin]   . Welchol [Colesevelam Hcl]   . Zoloft [Sertraline Hcl]   . Penicillins Rash   Past Medical History:  Diagnosis Date  . Gout   . Hyperlipidemia   . Hypertension   . Other testicular hypofunction   . Type II or unspecified type diabetes mellitus without mention of complication, not stated as uncontrolled    Health Maintenance  Topic Date Due  . OPHTHALMOLOGY EXAM  03/01/1961  . COLONOSCOPY  03/01/2001  . PNA vac Low Risk Adult (1 of 2 - PCV13) 03/01/2016  . FOOT EXAM  03/10/2017  . INFLUENZA VACCINE  03/20/2018 (Originally 01/18/2017)  . HEMOGLOBIN A1C  06/14/2017  . TETANUS/TDAP  12/26/2022  . Hepatitis C Screening  Completed   Immunization History  Administered Date(s) Administered  . PPD Test 02/07/2014, 02/16/2015  . Pneumococcal Polysaccharide-23 11/01/2010  .  Td 12/25/2012  . Zoster 12/25/2012   Past Surgical History:  Procedure Laterality Date  . PROSTATE BIOPSY  2010  . WRIST SURGERY Right 1998   Family History  Problem Relation Age of Onset  . Heart disease Father   . Kidney disease Father   . Heart attack Father    Social History   Social History  . Marital status: Single    Spouse name: N/A  . Number of children: N/A  . Years of education: N/A   Occupational History  . Not on file.   Social History Main Topics  . Smoking status: Never Smoker  . Smokeless tobacco: Never Used  . Alcohol use No  . Drug use: No  . Sexual  activity: Not on file   Other Topics Concern  . Not on file   Social History Narrative  . No narrative on file    ROS Constitutional: Denies fever, chills, weight loss/gain, headaches, insomnia,  night sweats or change in appetite. Does c/o fatigue. Eyes: Denies redness, blurred vision, diplopia, discharge, itchy or watery eyes.  ENT: Denies discharge, congestion, post nasal drip, epistaxis, sore throat, earache, hearing loss, dental pain, Tinnitus, Vertigo, Sinus pain or snoring.  Cardio: Denies chest pain, palpitations, irregular heartbeat, syncope, dyspnea, diaphoresis, orthopnea, PND, claudication or edema Respiratory: denies cough, dyspnea, DOE, pleurisy, hoarseness, laryngitis or wheezing.  Gastrointestinal: Denies dysphagia, heartburn, reflux, water brash, pain, cramps, nausea, vomiting, bloating, diarrhea, constipation, hematemesis, melena, hematochezia, jaundice or hemorrhoids Genitourinary: Denies dysuria, frequency, urgency, nocturia, hesitancy, discharge, hematuria or flank pain Musculoskeletal: Denies arthralgia, myalgia, stiffness, Jt. Swelling, pain, limp or strain/sprain. Denies Falls. Skin: Denies puritis, rash, hives, warts, acne, eczema or change in skin lesion Neuro: No weakness, tremor, incoordination, spasms, paresthesia or pain Psychiatric: Denies confusion, memory loss or sensory loss. Denies Depression. Endocrine: Denies change in weight, skin, hair change, nocturia, and paresthesia, diabetic polys, visual blurring or hyper / hypo glycemic episodes.  Heme/Lymph: No excessive bleeding, bruising or enlarged lymph nodes.  Physical Exam  BP 132/80   Pulse (!) 56   Temp (!) 97.3 F (36.3 C)   Resp 18   Ht 5\' 9"  (1.753 m)   Wt 184 lb 9.6 oz (83.7 kg)   BMI 27.26 kg/m   General Appearance: Well nourished and well groomed and in no apparent distress.  Eyes: PERRLA, EOMs, conjunctiva no swelling or erythema, normal fundi and vessels. Sinuses: No frontal/maxillary  tenderness ENT/Mouth: EACs patent / TMs  nl. Nares clear without erythema, swelling, mucoid exudates. Oral hygiene is good. No erythema, swelling, or exudate. Tongue normal, non-obstructing. Tonsils not swollen or erythematous. Hearing normal.  Neck: Supple, thyroid normal. No bruits, nodes or JVD. Respiratory: Respiratory effort normal.  BS equal and clear bilateral without rales, rhonci, wheezing or stridor. Cardio: Heart sounds are normal with regular rate and rhythm and no murmurs, rubs or gallops. Peripheral pulses are normal and equal bilaterally without edema. No aortic or femoral bruits. Chest: symmetric with normal excursions and percussion.  Abdomen: Soft, with Nl bowel sounds. Nontender, no guarding, rebound, hernias, masses, or organomegaly.  Lymphatics: Non tender without lymphadenopathy.  Genitourinary: No hernias.Testes nl. DRE - prostate nl for age - smooth & firm w/o nodules. Musculoskeletal: Full ROM all peripheral extremities, joint stability, 5/5 strength, and normal gait. Skin: Warm and dry without rashes, lesions, cyanosis, clubbing or  ecchymosis.  Neuro: Cranial nerves intact, reflexes equal bilaterally. Normal muscle tone, no cerebellar symptoms. Sensation intact to touch, vibratory and Monofilament to the  toes bilaterally. Pysch: Alert and oriented X 3 with normal affect, insight and judgment appropriate.   Assessment and Plan  1. Annual Preventative/Screening Exam   2. Essential hypertension  - EKG 12-Lead - [place], RETROPERITNL ABD,  LTD - Urinalysis, Routine w reflex microscopic - Microalbumin / creatinine urine ratio - CBC with Differential/Platelet - BASIC METABOLIC PANEL WITH GFR - Magnesium - TSH  3. Hyperlipidemia, mixed  - EKG 12-Lead - [place], RETROPERITNL ABD,  LTD - Hepatic function panel - Lipid panel - TSH - ezetimibe (ZETIA) 10 MG tablet; Take 1 tablet daily for Cholesterol  Dispense: 90 tablet; Refill: 1  4. Type 2 diabetes mellitus with stage 3  chronic kidney disease, with long-term current use of insulin (HCC)  - EKG 12-Lead - [place], RETROPERITNL ABD,  LTD - Urinalysis, Routine w reflex microscopic - Microalbumin / creatinine urine ratio - HM DIABETES FOOT EXAM - LOW EXTREMITY NEUR EXAM DOCUM - Hemoglobin A1c - Insulin, random  5. Vitamin D deficiency  - VITAMIN D 25 Hydroxy   6. Idiopathic gout  - Uric acid  7. Screening for rectal cancer  - POC Hemoccult Bld/Stl   8. Prostate cancer screening  - PSA  9. Elevated PSA  - PSA  10. Screening for ischemic heart disease  - EKG 12-Lead  11. Screening for AAA (aortic abdominal aneurysm)  - [place], RETROPERITNL ABD,  LTD  12. Medication management  - Urinalysis, Routine w reflex microscopic - Microalbumin / creatinine urine ratio - CBC with Differential/Platelet - BASIC METABOLIC PANEL WITH GFR - Hepatic function panel - Magnesium - Lipid panel - TSH - Hemoglobin A1c - Insulin, random - VITAMIN D 25 Hydroxy  - Uric acid  13. Need for prophylactic vaccination against Streptococcus pneumoniae (pneumococcus)  - Pneumococcal conjugate vaccine 13-valent         Patient was counseled in prudent diet, weight control to achieve/maintain BMI less than 25, BP monitoring, regular exercise and medications as discussed.  Discussed med effects and SE's. Routine screening labs and tests as requested with regular follow-up as recommended. Over 40 minutes of exam, counseling, chart review and high complex critical decision making was performed

## 2017-04-12 NOTE — Patient Instructions (Signed)

## 2017-04-13 LAB — HEPATIC FUNCTION PANEL
AG Ratio: 1.8 (calc) (ref 1.0–2.5)
ALBUMIN MSPROF: 4.8 g/dL (ref 3.6–5.1)
ALKALINE PHOSPHATASE (APISO): 50 U/L (ref 40–115)
ALT: 28 U/L (ref 9–46)
AST: 24 U/L (ref 10–35)
BILIRUBIN TOTAL: 0.8 mg/dL (ref 0.2–1.2)
Bilirubin, Direct: 0.1 mg/dL (ref 0.0–0.2)
Globulin: 2.7 g/dL (calc) (ref 1.9–3.7)
Indirect Bilirubin: 0.7 mg/dL (calc) (ref 0.2–1.2)
TOTAL PROTEIN: 7.5 g/dL (ref 6.1–8.1)

## 2017-04-13 LAB — CBC WITH DIFFERENTIAL/PLATELET
Basophils Absolute: 90 cells/uL (ref 0–200)
Basophils Relative: 1.1 %
EOS PCT: 3.8 %
Eosinophils Absolute: 312 cells/uL (ref 15–500)
HEMATOCRIT: 46.8 % (ref 38.5–50.0)
Hemoglobin: 15.9 g/dL (ref 13.2–17.1)
LYMPHS ABS: 1960 {cells}/uL (ref 850–3900)
MCH: 28.2 pg (ref 27.0–33.0)
MCHC: 34 g/dL (ref 32.0–36.0)
MCV: 83.1 fL (ref 80.0–100.0)
MONOS PCT: 7.1 %
MPV: 9.8 fL (ref 7.5–12.5)
NEUTROS ABS: 5256 {cells}/uL (ref 1500–7800)
NEUTROS PCT: 64.1 %
Platelets: 282 10*3/uL (ref 140–400)
RBC: 5.63 10*6/uL (ref 4.20–5.80)
RDW: 12.7 % (ref 11.0–15.0)
Total Lymphocyte: 23.9 %
WBC mixed population: 582 cells/uL (ref 200–950)
WBC: 8.2 10*3/uL (ref 3.8–10.8)

## 2017-04-13 LAB — BASIC METABOLIC PANEL WITH GFR
BUN/Creatinine Ratio: 15 (calc) (ref 6–22)
BUN: 21 mg/dL (ref 7–25)
CALCIUM: 10 mg/dL (ref 8.6–10.3)
CHLORIDE: 101 mmol/L (ref 98–110)
CO2: 28 mmol/L (ref 20–32)
Creat: 1.38 mg/dL — ABNORMAL HIGH (ref 0.70–1.25)
GFR, EST NON AFRICAN AMERICAN: 53 mL/min/{1.73_m2} — AB (ref >=60)
GFR, Est African American: 61 mL/min/{1.73_m2} (ref >=60)
GLUCOSE: 136 mg/dL — AB (ref 65–99)
POTASSIUM: 4.6 mmol/L (ref 3.5–5.3)
SODIUM: 138 mmol/L (ref 135–146)

## 2017-04-13 LAB — URINALYSIS, ROUTINE W REFLEX MICROSCOPIC
Bacteria, UA: NONE SEEN /HPF
Bilirubin Urine: NEGATIVE
GLUCOSE, UA: NEGATIVE
HYALINE CAST: NONE SEEN /LPF
Hgb urine dipstick: NEGATIVE
Ketones, ur: NEGATIVE
Leukocytes, UA: NEGATIVE
Nitrite: NEGATIVE
PH: 5.5 (ref 5.0–8.0)
Protein, ur: NEGATIVE
RBC / HPF: NONE SEEN /HPF (ref 0–2)
SPECIFIC GRAVITY, URINE: 1.013 (ref 1.001–1.03)
Squamous Epithelial / LPF: NONE SEEN /HPF (ref <=5)
WBC, UA: NONE SEEN /HPF (ref 0–5)

## 2017-04-13 LAB — HEMOGLOBIN A1C
EAG (MMOL/L): 8.5 (calc)
Hgb A1c MFr Bld: 7 % of total Hgb — ABNORMAL HIGH (ref <5.7)
MEAN PLASMA GLUCOSE: 154 (calc)

## 2017-04-13 LAB — LIPID PANEL
CHOLESTEROL: 229 mg/dL — AB (ref <200)
HDL: 34 mg/dL — AB (ref >40)
LDL Cholesterol (Calc): 148 mg/dL (calc) — ABNORMAL HIGH
Non-HDL Cholesterol (Calc): 195 mg/dL (calc) — ABNORMAL HIGH (ref <130)
TRIGLYCERIDES: 304 mg/dL — AB (ref <150)
Total CHOL/HDL Ratio: 6.7 (calc) — ABNORMAL HIGH (ref <5.0)

## 2017-04-13 LAB — TSH: TSH: 1.6 mIU/L (ref 0.40–4.50)

## 2017-04-13 LAB — URIC ACID: Uric Acid, Serum: 6.1 mg/dL (ref 4.0–8.0)

## 2017-04-13 LAB — MICROALBUMIN / CREATININE URINE RATIO
Creatinine, Urine: 87 mg/dL (ref 20–320)
Microalb Creat Ratio: 5 mcg/mg creat (ref <30)
Microalb, Ur: 0.4 mg/dL

## 2017-04-13 LAB — MAGNESIUM: MAGNESIUM: 1.8 mg/dL (ref 1.5–2.5)

## 2017-04-13 LAB — INSULIN, RANDOM: Insulin: 7.8 u[IU]/mL (ref 2.0–19.6)

## 2017-04-13 LAB — VITAMIN D 25 HYDROXY (VIT D DEFICIENCY, FRACTURES): Vit D, 25-Hydroxy: 51 ng/mL (ref 30–100)

## 2017-04-13 LAB — PSA: PSA: 2.2 ng/mL (ref <=4.0)

## 2017-04-13 MED ORDER — EZETIMIBE 10 MG PO TABS: ORAL_TABLET | ORAL | 1 refills | Status: DC

## 2017-04-15 ENCOUNTER — Encounter: Payer: Self-pay | Admitting: Internal Medicine

## 2017-05-25 ENCOUNTER — Other Ambulatory Visit: Payer: Self-pay | Admitting: Internal Medicine

## 2017-07-18 DIAGNOSIS — E1122 Type 2 diabetes mellitus with diabetic chronic kidney disease: Secondary | ICD-10-CM | POA: Insufficient documentation

## 2017-07-18 DIAGNOSIS — N183 Chronic kidney disease, stage 3 unspecified: Secondary | ICD-10-CM | POA: Insufficient documentation

## 2017-07-18 DIAGNOSIS — E663 Overweight: Secondary | ICD-10-CM | POA: Insufficient documentation

## 2017-07-18 HISTORY — DX: Chronic kidney disease, stage 3 unspecified: N18.30

## 2017-07-18 HISTORY — DX: Type 2 diabetes mellitus with diabetic chronic kidney disease: E11.22

## 2017-07-18 NOTE — Progress Notes (Signed)
MEDICARE ANNUAL WELLNESS VISIT AND FOLLOW UP Assessment:   Diagnoses and all orders for this visit:  Encounter for Medicare annual wellness exam  Essential hypertension At goal; continue medications Monitor blood pressure at home; call if consistently over 130/80 Continue DASH diet.   Reminder to go to the ER if any CP, SOB, nausea, dizziness, severe HA, changes vision/speech, left arm numbness and tingling and jaw pain.  Type 2 diabetes mellitus with stage 3 chronic kidney disease, without long-term current use of insulin (HCC) Type 2 Diabetes Mellitus - poorly controlled Education: Reviewed 'ABCs' of diabetes management (respective goals in parentheses):  A1C (<7), blood pressure (<130/80), and cholesterol (LDL <70) Eye Exam yearly and Dental Exam every 6 months- he states he will schedule eye exam today Just had foot exam at CPE; defer Dietary recommendations Physical Activity recommendations --     BASIC METABOLIC PANEL WITH GFR -     Hemoglobin A1c  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 (HCC) Increase fluids, avoid NSAIDS, monitor sugars, will monitor -     BASIC METABOLIC PANEL WITH GFR  Mixed hyperlipidemia Fairly controlled on zetia; has been intolerant of several statins Continue low cholesterol diet and exercise.  -     Lipid panel -     TSH  Testosterone deficiency Has opted against supplementation Recommended weight loss, zinc 50 mg daily supplement, high-intensity interval exercises  Idiopathic gout, unspecified chronicity, unspecified site Continue allopurinol Diet discussed Check uric acid as needed - at goal at recent check  Vitamin D deficiency Below goal at last check; he has not  increased dose; goal is 70-100 He will increase dose this visit -  Defer vitamin D check until next visit  Medication management -     CBC with Differential/Platelet -     BASIC METABOLIC PANEL WITH GFR -     Hepatic function panel  Overweight (BMI 25.0-29.9) Long  discussion about weight loss, diet, and exercise Recommended diet heavy in fruits and veggies and low in animal meats, cheeses, and dairy products, appropriate calorie intake Patient will work on getting out and walking intentionally Discussed appropriate weight for height and initial goal (182 lb) Follow up at next visit  Over 30 minutes of exam, counseling, chart review, and critical decision making was performed  Future Appointments  Date Time Provider [place]  11/01/2017  9:30 AM Unk Pinto, MD [facility] None  05/29/2018 10:00 AM Unk Pinto, MD [facility] None     Plan:   During the course of the visit the patient was educated and counseled about appropriate screening and preventive services including:    Pneumococcal vaccine   Influenza vaccine  Prevnar 13  Td vaccine  Screening electrocardiogram  Colorectal cancer screening  Diabetes screening  Glaucoma screening  Nutrition counseling    Subjective:  [name] is a 67 y.o. male who presents for Medicare Annual Wellness Visit and 3 month follow up for HTN, hyperlipidemia, T2DM with CKD 3, and vitamin D Def.   BMI is Body mass index is 27.64 kg/m., he has been working on diet- avoiding sweets and soda. Modest intake of white carbs. Pushes fruit and vegetable intake.  Wt Readings from Last 3 Encounters:  07/19/17 187 lb 3.2 oz (84.9 kg)  04/12/17 184 lb 9.6 oz (83.7 kg)  12/13/16 192 lb (87.1 kg)   His blood pressure has been controlled at home, today their BP is BP: 120/78 He does not workout - but walks extensively at  home and fishes on his days off.  He denies chest pain, shortness of breath, dizziness.   He is on cholesterol medication (zetia 10 mg daily - intolerant of several statins) and denies myalgias. His cholesterol is not at goal. The cholesterol last visit was:   Lab Results  Component Value Date   CHOL 229 (H) 04/12/2017   HDL 34 (L) 04/12/2017   LDLCALC 104 (H)  12/13/2016   TRIG 304 (H) 04/12/2017   CHOLHDL 6.7 (H) 04/12/2017   He has been working on diet and exercise for T2 diabetes, and denies increased appetite, nausea, paresthesia of the feet, polydipsia, polyuria, visual disturbances, vomiting and weight loss. He has increased his metformin dose since last visit. Last A1C in the office was:  Lab Results  Component Value Date   HGBA1C 7.0 (H) 04/12/2017   Last GFR Lab Results  Component Value Date   GFRNONAA 53 (L) 04/12/2017    Patient is on Vitamin D supplement but remained below goal at most recent check:    Lab Results  Component Value Date   VD25OH 51 04/12/2017     Patient is on allopurinol for gout and does not report a recent flare.  Lab Results  Component Value Date   LABURIC 6.1 04/12/2017     Medication Review: Current Outpatient Medications on File Prior to Visit  Medication Sig Dispense Refill  . allopurinol (ZYLOPRIM) 300 MG tablet Take 1 tablet (300 mg total) by mouth daily. 90 tablet 1  . BABY ASPIRIN PO Take 81 mg by mouth daily.    . bisoprolol-hydrochlorothiazide (ZIAC) 5-6.25 MG tablet TAKE ONE TABLET BY MOUTH DAILY FOR BLOOD PRESSURE 90 tablet 1  . Cholecalciferol (VITAMIN D PO) Take 5,000 Int'l Units by mouth daily.    . enalapril (VASOTEC) 20 MG tablet TAKE ONE TABLET BY MOUTH DAILY 90 tablet 1  . ezetimibe (ZETIA) 10 MG tablet Take 1 tablet daily for Cholesterol 90 tablet 1  . metFORMIN (GLUCOPHAGE XR) 500 MG 24 hr tablet Take 1 tablet with breakfast, take 1 tablet with lunch and take 2 tablets with dinner. 120 tablet 11  . colchicine 0.6 MG tablet Take 1 to 2 tablets daily for gout attack and PRN. 60 tablet 1   No current facility-administered medications on file prior to visit.     Allergies: Allergies  Allergen Reactions  . Crestor [Rosuvastatin]   . Welchol [Colesevelam Hcl]   . Zoloft [Sertraline Hcl]   . Penicillins Rash    Current Problems (verified) has Hyperlipidemia; Hypertension; Type  2 diabetes mellitus ([place]); Testosterone deficiency; Gout; Vitamin D deficiency; Medication management; 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 ([place]); and Overweight (BMI 25.0-29.9) on their problem list.  Screening Tests Immunization History  Administered Date(s) Administered  . PPD Test 02/07/2014, 02/16/2015  . Pneumococcal Conjugate-13 04/12/2017  . Pneumococcal Polysaccharide-23 11/01/2010  . Td 12/25/2012  . Zoster 12/25/2012   Preventative care: Last colonoscopy: 2002? cologuard ordered last year but not completed- reordered  Prior vaccinations: TD or Tdap: 2014   Influenza: Refuses -  Pneumococcal: 2012 Prevnar13: 2018 Shingles/Zostavax: 2014  Names of Other Physician/Practitioners you currently use: 1. [facility] here for primary care 2. Dr. Herbert Deaner, eye doctor, last visit 2015 - need diabetic eye exams and reports - he will go home and schedule today 3. Dr. Donnal Debar, dentist, last visit 2018  Patient Care Team: Unk Pinto, MD as PCP - General (Internal Medicine)  Surgical: He  has a past surgical history that includes Wrist surgery (Right, 1998) and Prostate biopsy (2010). Family His family history includes Heart attack in his father; Heart disease in his father; Kidney disease in his father. Social history  He reports that  has never smoked. he has never used smokeless tobacco. He reports that he does not drink alcohol or use drugs.  MEDICARE WELLNESS OBJECTIVES: Physical activity: Current Exercise Habits: The patient has a physically strenous job, but has no regular exercise apart from work.(Walks 3-5 miles daily at work), Type of exercise: walking, Intensity: Mild, Exercise limited by: None identified Cardiac risk factors: Cardiac Risk Factors include: advanced age (>31men, >32 women);diabetes mellitus;dyslipidemia;hypertension;male gender Depression/mood screen:   Depression screen [facility] 2/9 07/19/2017  Decreased Interest  0  Down, Depressed, Hopeless 0  PHQ - 2 Score 0    ADLs:  In your present state of health, do you have any difficulty performing the following activities: 07/19/2017 04/15/2017  Hearing? N N  Vision? N N  Difficulty concentrating or making decisions? N N  Walking or climbing stairs? N N  Dressing or bathing? N N  Doing errands, shopping? N N  Some recent data might be hidden     Cognitive Testing  Alert? Yes  Normal Appearance?Yes  Oriented to person? Yes  Place? Yes   Time? Yes  Recall of three objects?  Yes  Can perform simple calculations? Yes  Displays appropriate judgment?Yes  Can read the correct time from a watch face?Yes  EOL planning: Does Patient Have a Medical Advance Directive?: Yes Type of Advance Directive: Living will Does patient want to make changes to medical advance directive?: No - Patient declined  Review of Systems  Constitutional: Negative for malaise/fatigue and weight loss.  HENT: Negative for hearing loss and tinnitus.   Eyes: Negative for blurred vision and double vision.  Respiratory: Negative for cough, shortness of breath and wheezing.   Cardiovascular: Negative for chest pain, palpitations, orthopnea, claudication and leg swelling.  Gastrointestinal: Negative for abdominal pain, blood in stool, constipation, diarrhea, heartburn, melena, nausea and vomiting.  Genitourinary: Negative.   Musculoskeletal: Negative for joint pain and myalgias.  Skin: Negative for rash.  Neurological: Negative for dizziness, tingling, sensory change, weakness and headaches.  Endo/Heme/Allergies: Negative for polydipsia.  Psychiatric/Behavioral: Negative.   All other systems reviewed and are negative.    Objective:   Today's Vitals   07/19/17 0846  BP: 120/78  Pulse: (!) 59  Temp: 97.9 F (36.6 C)  SpO2: 99%  Weight: 187 lb 3.2 oz (84.9 kg)  Height: 5\' 9"  (1.753 m)   Body mass index is 27.64 kg/m.  General appearance: alert, no distress, WD/WN,  male HEENT: normocephalic, sclerae anicteric, TMs pearly, nares patent, no discharge or erythema, pharynx normal Oral cavity: MMM, no lesions Neck: supple, no lymphadenopathy, no thyromegaly, no masses Heart: RRR, normal S1, S2, no murmurs Lungs: CTA bilaterally, no wheezes, rhonchi, or rales Abdomen: +bs, soft, non tender, non distended, no masses, no hepatomegaly, no splenomegaly Musculoskeletal: nontender, no swelling, no obvious deformity Extremities: no edema, no cyanosis, no clubbing Pulses: 2+ symmetric, upper and lower extremities, normal cap refill Neurological: alert, oriented x 3, CN2-12 intact, strength normal upper extremities and lower extremities, sensation normal throughout, DTRs 2+ throughout, no cerebellar signs, gait normal Psychiatric: normal affect, behavior normal, pleasant   Medicare Attestation I have personally reviewed: The patient's medical and social history Their use of alcohol, tobacco or illicit drugs Their current medications and supplements The patient's functional  ability including ADLs,fall risks, home safety risks, cognitive, and hearing and visual impairment Diet and physical activities Evidence for depression or mood disorders  The patient's weight, height, BMI, and visual acuity have been recorded in the chart.  I have made referrals, counseling, and provided education to the patient based on review of the above and I have provided the patient with a written personalized care plan for preventive services.     Izora Ribas, NP   07/19/2017

## 2017-07-19 ENCOUNTER — Ambulatory Visit (INDEPENDENT_AMBULATORY_CARE_PROVIDER_SITE_OTHER): Payer: PPO | Admitting: Adult Health

## 2017-07-19 ENCOUNTER — Encounter: Payer: Self-pay | Admitting: Adult Health

## 2017-07-19 VITALS — BP 120/78 | HR 59 | Temp 97.9°F | Ht 69.0 in | Wt 187.2 lb

## 2017-07-19 DIAGNOSIS — N183 Chronic kidney disease, stage 3 unspecified: Secondary | ICD-10-CM

## 2017-07-19 DIAGNOSIS — E349 Endocrine disorder, unspecified: Secondary | ICD-10-CM | POA: Diagnosis not present

## 2017-07-19 DIAGNOSIS — Z0001 Encounter for general adult medical examination with abnormal findings: Secondary | ICD-10-CM

## 2017-07-19 DIAGNOSIS — E1122 Type 2 diabetes mellitus with diabetic chronic kidney disease: Secondary | ICD-10-CM

## 2017-07-19 DIAGNOSIS — Z1211 Encounter for screening for malignant neoplasm of colon: Secondary | ICD-10-CM

## 2017-07-19 DIAGNOSIS — M1000 Idiopathic gout, unspecified site: Secondary | ICD-10-CM

## 2017-07-19 DIAGNOSIS — Z0000 Encounter for general adult medical examination without abnormal findings: Secondary | ICD-10-CM

## 2017-07-19 DIAGNOSIS — R6889 Other general symptoms and signs: Secondary | ICD-10-CM

## 2017-07-19 DIAGNOSIS — E559 Vitamin D deficiency, unspecified: Secondary | ICD-10-CM | POA: Diagnosis not present

## 2017-07-19 DIAGNOSIS — E782 Mixed hyperlipidemia: Secondary | ICD-10-CM | POA: Diagnosis not present

## 2017-07-19 DIAGNOSIS — E663 Overweight: Secondary | ICD-10-CM | POA: Diagnosis not present

## 2017-07-19 DIAGNOSIS — Z79899 Other long term (current) drug therapy: Secondary | ICD-10-CM

## 2017-07-19 DIAGNOSIS — I10 Essential (primary) hypertension: Secondary | ICD-10-CM

## 2017-07-19 NOTE — Patient Instructions (Signed)
8 Critical Weight-Loss Tips That Aren't Diet and Exercise  1. STARVE THE DISTRACTIONS  All too often when we eat, we're also multitasking: watching TV, answering emails, scrolling through social media. These habits are detrimental to having a strong, clear, healthy relationship with food, and they can hinder our ability to make dietary changes.  In order to truly focus on what you're eating, how much you're eating, why you're eating those specific foods and, most importantly, how those foods make you feel, you need to starve the distractions. That means when you eat, just eat. Focus on your food, the process it went through to end up on your plate, where it came from and how it nourishes you. With this technique, you're more likely to finish a meal feeling satiated.  2.  CONSIDER WHAT YOU'RE NOT WILLING TO DO  This might sound counterintuitive, but it can help provide a "why" when motivation is waning. Declare, in writing, what you are unwilling to do, for example "I am unwilling to be the old dad who cannot play sports with my children".  So consider what you're not willing to accept, write it down, and keep it at the ready.  3.  STOP LABELING FOOD "GOOD" AND "BAD"  You've probably heard someone say they ate something "bad." Maybe you've even said it yourself.  The trouble with 'bad' foods isn't that they'll send you to the grave after a bite or two. The trouble comes when we eat excessive portions of really calorie-dense foods meal after meal, day after day.  Instead of labeling foods as good or bad, think about which foods you can eat a lot of, and which ones you should just eat a little of. Then, plan ways to eat the foods you really like in portions that fit with your overall goals. A good example of this would be having a slice of pizza alongside a club salad with chicken breast, avocado and a bit of dressing. This is vastly different than 3 slices of pizza, 4 breadsticks with cheese sauce  and half of a liter of regular soda.  4.  BRUSH YOUR TEETH AFTER YOU EAT  Getting your mindset in order is important, but sometimes small habits can make a big difference. After eating, you still have the taste of food in their mouth, which often causes people to eat more even if they are full or engage in a nibble or two of dessert.  Brushing your teeth will remove the taste of food from your mouth, and the clean, minty freshness will serve as a cue that mealtime is over.  5.  FOCUS ON CROWDING NOT CUTTING  The most common first step during 'dieting' is to cut. We cut our portion sizes down, we cut out 'bad' foods, we cut out entire food groups. This act of cutting puts us and our minds into scarcity mode.  When something is off-limits, even if you're able to avoid it for a while, you could end up bingeing on it later because you've gone so long without it. So, instead of cutting, focus on crowding. If you crowd your plate and fill it up with more foods like veggies and protein, it simply allows less room for the other stuff. In other words, shift your focus away from what you can't eat, and celebrate the foods that will help you reach your goals.  6.  TAKE TRACKING A STEP FURTHER  Track what you eat, when you ate it, how much you ate   and how that food made you feel. Being completely honest with yourself and writing down every single thing that passes through your lips will help you start to notice that maybe you actually do snack, possibly take in more sugar than you thought, eat when you're bored rather than just hungry or maybe that you have a habit of snacking before bed while watching TV.  The difference from simply tracking your food intake is you're taking into account how food makes you feel, as well as what you're doing while you're eating. This is about becoming more mindful of what, when and why you eat.  7.  PRIORITIZE GOOD SLEEP  One of the strongest risk factors for being  overweight is poor sleep. When you're feeling tired, you're more likely to choose unhealthy comfort foods and to skip your workout. Additionally, sleep deprivation may slow down your metabolism. Yikes! Therefore, sleeping 7-8 hours per night can help with weight loss without having to change your diet or increase your physical activity. And if you feel you snore and still wake up tired, talk with me about sleep apnea.  8.  SET ASIDE TIME TO DISCONNECT  Just get out there. Disconnect from the electronics and connect to the elements. Not only will this help reduce stress (a major factor in weight gain) by giving your mind a break from the constant stimulation we've all become so accustomed to, but it may also reprogram your brain to connect with yourself and what you're feeling.  

## 2017-07-20 ENCOUNTER — Other Ambulatory Visit: Payer: Self-pay | Admitting: Adult Health

## 2017-07-20 LAB — CBC WITH DIFFERENTIAL/PLATELET
BASOS ABS: 78 {cells}/uL (ref 0–200)
Basophils Relative: 1.1 %
EOS ABS: 270 {cells}/uL (ref 15–500)
Eosinophils Relative: 3.8 %
HCT: 47.6 % (ref 38.5–50.0)
Hemoglobin: 16 g/dL (ref 13.2–17.1)
Lymphs Abs: 1775 cells/uL (ref 850–3900)
MCH: 27 pg (ref 27.0–33.0)
MCHC: 33.6 g/dL (ref 32.0–36.0)
MCV: 80.3 fL (ref 80.0–100.0)
MPV: 9.4 fL (ref 7.5–12.5)
Monocytes Relative: 7.9 %
NEUTROS PCT: 62.2 %
Neutro Abs: 4416 cells/uL (ref 1500–7800)
Platelets: 263 10*3/uL (ref 140–400)
RBC: 5.93 10*6/uL — ABNORMAL HIGH (ref 4.20–5.80)
RDW: 13.1 % (ref 11.0–15.0)
TOTAL LYMPHOCYTE: 25 %
WBC: 7.1 10*3/uL (ref 3.8–10.8)
WBCMIX: 561 {cells}/uL (ref 200–950)

## 2017-07-20 LAB — HEPATIC FUNCTION PANEL
AG RATIO: 1.7 (calc) (ref 1.0–2.5)
ALKALINE PHOSPHATASE (APISO): 53 U/L (ref 40–115)
ALT: 34 U/L (ref 9–46)
AST: 28 U/L (ref 10–35)
Albumin: 4.7 g/dL (ref 3.6–5.1)
BILIRUBIN INDIRECT: 0.6 mg/dL (ref 0.2–1.2)
BILIRUBIN TOTAL: 0.7 mg/dL (ref 0.2–1.2)
Bilirubin, Direct: 0.1 mg/dL (ref 0.0–0.2)
Globulin: 2.7 g/dL (calc) (ref 1.9–3.7)
Total Protein: 7.4 g/dL (ref 6.1–8.1)

## 2017-07-20 LAB — BASIC METABOLIC PANEL WITH GFR
BUN / CREAT RATIO: 15 (calc) (ref 6–22)
BUN: 22 mg/dL (ref 7–25)
CO2: 26 mmol/L (ref 20–32)
CREATININE: 1.43 mg/dL — AB (ref 0.70–1.25)
Calcium: 10.1 mg/dL (ref 8.6–10.3)
Chloride: 102 mmol/L (ref 98–110)
GFR, EST NON AFRICAN AMERICAN: 51 mL/min/{1.73_m2} — AB (ref >=60)
GFR, Est African American: 59 mL/min/{1.73_m2} — ABNORMAL LOW (ref >=60)
GLUCOSE: 145 mg/dL — AB (ref 65–99)
POTASSIUM: 5.5 mmol/L — AB (ref 3.5–5.3)
Sodium: 139 mmol/L (ref 135–146)

## 2017-07-20 LAB — LIPID PANEL
Cholesterol: 203 mg/dL — ABNORMAL HIGH (ref <200)
HDL: 38 mg/dL — ABNORMAL LOW (ref >40)
LDL Cholesterol (Calc): 120 mg/dL (calc) — ABNORMAL HIGH
NON-HDL CHOLESTEROL (CALC): 165 mg/dL — AB (ref <130)
Total CHOL/HDL Ratio: 5.3 (calc) — ABNORMAL HIGH (ref <5.0)
Triglycerides: 317 mg/dL — ABNORMAL HIGH (ref <150)

## 2017-07-20 LAB — VITAMIN D 25 HYDROXY (VIT D DEFICIENCY, FRACTURES): Vit D, 25-Hydroxy: 41 ng/mL (ref 30–100)

## 2017-07-20 LAB — TSH: TSH: 1.67 m[IU]/L (ref 0.40–4.50)

## 2017-07-20 LAB — HEMOGLOBIN A1C
Hgb A1c MFr Bld: 7.2 % of total Hgb — ABNORMAL HIGH (ref <5.7)
Mean Plasma Glucose: 160 (calc)
eAG (mmol/L): 8.9 (calc)

## 2017-07-21 DIAGNOSIS — E119 Type 2 diabetes mellitus without complications: Secondary | ICD-10-CM | POA: Diagnosis not present

## 2017-07-21 LAB — HM DIABETES EYE EXAM

## 2017-07-31 ENCOUNTER — Encounter: Payer: Self-pay | Admitting: *Deleted

## 2017-08-23 DIAGNOSIS — Z1212 Encounter for screening for malignant neoplasm of rectum: Secondary | ICD-10-CM | POA: Diagnosis not present

## 2017-08-23 DIAGNOSIS — Z1211 Encounter for screening for malignant neoplasm of colon: Secondary | ICD-10-CM | POA: Diagnosis not present

## 2017-09-01 ENCOUNTER — Encounter: Payer: Self-pay | Admitting: Internal Medicine

## 2017-09-01 ENCOUNTER — Other Ambulatory Visit: Payer: Self-pay | Admitting: Internal Medicine

## 2017-09-01 LAB — COLOGUARD

## 2017-09-05 NOTE — Telephone Encounter (Signed)
No voicemail. Mailed letter, Cologuard result, referral, and  requested patient to call to review results. Entered referral to [facility]

## 2017-09-12 ENCOUNTER — Encounter: Payer: Self-pay | Admitting: Gastroenterology

## 2017-10-11 ENCOUNTER — Other Ambulatory Visit: Payer: Self-pay | Admitting: Internal Medicine

## 2017-10-11 ENCOUNTER — Other Ambulatory Visit: Payer: Self-pay | Admitting: *Deleted

## 2017-10-11 DIAGNOSIS — M25512 Pain in left shoulder: Secondary | ICD-10-CM | POA: Diagnosis not present

## 2017-10-11 MED ORDER — METFORMIN HCL ER 500 MG PO TB24: ORAL_TABLET | ORAL | 1 refills | Status: DC

## 2017-10-11 MED ORDER — ALLOPURINOL 300 MG PO TABS: 300.0000 mg | ORAL_TABLET | Freq: Every day | ORAL | 1 refills | Status: DC

## 2017-10-18 ENCOUNTER — Other Ambulatory Visit: Payer: Self-pay | Admitting: *Deleted

## 2017-10-18 MED ORDER — METFORMIN HCL ER 500 MG PO TB24: ORAL_TABLET | ORAL | 1 refills | Status: DC

## 2017-10-23 ENCOUNTER — Other Ambulatory Visit: Payer: Self-pay | Admitting: Internal Medicine

## 2017-10-23 ENCOUNTER — Encounter: Payer: Self-pay | Admitting: Internal Medicine

## 2017-10-23 ENCOUNTER — Ambulatory Visit (AMBULATORY_SURGERY_CENTER): Payer: Self-pay

## 2017-10-23 ENCOUNTER — Other Ambulatory Visit: Payer: Self-pay

## 2017-10-23 VITALS — Ht 70.0 in | Wt 186.4 lb

## 2017-10-23 DIAGNOSIS — E1122 Type 2 diabetes mellitus with diabetic chronic kidney disease: Secondary | ICD-10-CM

## 2017-10-23 DIAGNOSIS — R195 Other fecal abnormalities: Secondary | ICD-10-CM

## 2017-10-23 DIAGNOSIS — N183 Chronic kidney disease, stage 3 (moderate): Principal | ICD-10-CM

## 2017-10-23 MED ORDER — METFORMIN HCL ER 500 MG PO TB24: ORAL_TABLET | ORAL | 1 refills | Status: DC

## 2017-10-23 MED ORDER — PEG-KCL-NACL-NASULF-NA ASC-C 140 G PO SOLR: 1.0000 | Freq: Once | ORAL | 0 refills | Status: AC

## 2017-10-23 NOTE — Progress Notes (Signed)
Denies allergies to eggs or soy products. Denies complication of anesthesia or sedation. Denies use of weight loss medication. Denies use of O2.   Emmi instructions declined.   A sample of Plenvu was given to [facility]. His insurance will not pay for Plenvu.   When reviewing Rivan's medical history Jakeob said that when he had a prostate biopsy the physician clipped an artery and he almost bled out. After that experience he has been fearful of having anything else done. Patient states that he is not aware of any bleeding disorders.   Riki Sheer, LPN

## 2017-10-24 ENCOUNTER — Encounter: Payer: Self-pay | Admitting: Gastroenterology

## 2017-11-01 ENCOUNTER — Ambulatory Visit: Payer: Self-pay | Admitting: Internal Medicine

## 2017-11-07 ENCOUNTER — Ambulatory Visit (AMBULATORY_SURGERY_CENTER): Payer: PPO | Admitting: Gastroenterology

## 2017-11-07 ENCOUNTER — Other Ambulatory Visit: Payer: Self-pay

## 2017-11-07 ENCOUNTER — Encounter: Payer: Self-pay | Admitting: Gastroenterology

## 2017-11-07 VITALS — BP 90/47 | HR 66 | Temp 98.4°F | Resp 20 | Ht 70.0 in | Wt 186.0 lb

## 2017-11-07 DIAGNOSIS — E119 Type 2 diabetes mellitus without complications: Secondary | ICD-10-CM | POA: Diagnosis not present

## 2017-11-07 DIAGNOSIS — D122 Benign neoplasm of ascending colon: Secondary | ICD-10-CM

## 2017-11-07 DIAGNOSIS — D125 Benign neoplasm of sigmoid colon: Secondary | ICD-10-CM | POA: Diagnosis not present

## 2017-11-07 DIAGNOSIS — R195 Other fecal abnormalities: Secondary | ICD-10-CM | POA: Diagnosis not present

## 2017-11-07 DIAGNOSIS — D123 Benign neoplasm of transverse colon: Secondary | ICD-10-CM | POA: Diagnosis not present

## 2017-11-07 DIAGNOSIS — I10 Essential (primary) hypertension: Secondary | ICD-10-CM | POA: Diagnosis not present

## 2017-11-07 MED ORDER — SODIUM CHLORIDE 0.9 % IV SOLN: 500.0000 mL | INTRAVENOUS | Status: DC

## 2017-11-07 NOTE — Op Note (Signed)
[facility] Patient Name: [name] Procedure Date: 11/07/2017 8:28 AM MRN: [id number] Endoscopist: Mallie Mussel L. Loletha Carrow , MD Age: 67 Referring MD:  Date of Birth: 16-Feb-1951 Gender: Male Account #: [id number] Procedure:                Colonoscopy Indications:              Positive Cologuard test Medicines:                Monitored Anesthesia Care Procedure:                Pre-Anesthesia Assessment:                           - Prior to the procedure, a History and Physical                            was performed, and patient medications and                            allergies were reviewed. The patient's tolerance of                            previous anesthesia was also reviewed. The risks                            and benefits of the procedure and the sedation                            options and risks were discussed with the patient.                            All questions were answered, and informed consent                            was obtained. Prior Anticoagulants: The patient has                            taken no previous anticoagulant or antiplatelet                            agents. ASA Grade Assessment: II - A patient with                            mild systemic disease. After reviewing the risks                            and benefits, the patient was deemed in                            satisfactory condition to undergo the procedure.                           After obtaining informed consent, the colonoscope  was passed under direct vision. Throughout the                            procedure, the patient's blood pressure, pulse, and                            oxygen saturations were monitored continuously. The                            Colonoscope was introduced through the anus and                            advanced to the the cecum, identified by                            appendiceal orifice and ileocecal valve. The                        colonoscopy was performed without difficulty. The                            patient tolerated the procedure well. The quality                            of the bowel preparation was excellent. The                            ileocecal valve, appendiceal orifice, and rectum                            were photographed. The quality of the bowel                            preparation was evaluated using the BBPS [facility]                            Bowel Preparation Scale) with scores of: Right                            Colon = 3, Transverse Colon = 3 and Left Colon = 3                            (entire mucosa seen well with no residual staining,                            small fragments of stool or opaque liquid). The                            total BBPS score equals 9. Scope In: 8:46:34 AM Scope Out: 9:03:28 AM Scope Withdrawal Time: 0 hours 14 minutes 54 seconds  Total Procedure Duration: 0 hours 16 minutes 54 seconds  Findings:                 The perianal and digital rectal examinations were  normal.                           Two sessile polyps were found in the transverse                            colon (Jar 2) and ascending colon (Jar 1). The                            polyps were 6 to 8 mm in size. These polyps were                            removed with a cold snare. Resection and retrieval                            were complete.                           A 2 mm polyp was found in the sigmoid colon (Jar                            2). The polyp was sessile. The polyp was removed                            with a cold biopsy forceps. Resection and retrieval                            were complete.                           The exam was otherwise without abnormality on                            direct and retroflexion views. Complications:            No immediate complications. Estimated Blood Loss:     Estimated blood loss was  minimal. Impression:               - Two 6 to 8 mm polyps in the transverse colon and                            in the ascending colon, removed with a cold snare.                            Resected and retrieved.                           - One 2 mm polyp in the sigmoid colon, removed with                            a cold biopsy forceps. Resected and retrieved.                           - The examination was otherwise normal on direct  and retroflexion views. Recommendation:           - Patient has a contact number available for                            emergencies. The signs and symptoms of potential                            delayed complications were discussed with the                            patient. Return to normal activities tomorrow.                            Written discharge instructions were provided to the                            patient.                           - Resume previous diet.                           - Continue present medications.                           - Await pathology results.                           - Repeat colonoscopy is recommended for                            surveillance. The colonoscopy date will be                            determined after pathology results from today's                            exam become available for review. Terisha Losasso L. Loletha Carrow, MD 11/07/2017 9:09:04 AM This report has been signed electronically.

## 2017-11-07 NOTE — Progress Notes (Signed)
Pt's states no medical or surgical changes since previsit or office visit. 

## 2017-11-07 NOTE — Progress Notes (Signed)
Called to room to assist during endoscopic procedure.  Patient ID and intended procedure confirmed with present staff. Received instructions for my participation in the procedure from the performing physician.  

## 2017-11-07 NOTE — Progress Notes (Signed)
Report to RN, VSS, adequate respirations noted, no c/o pain or discomfort 

## 2017-11-07 NOTE — Patient Instructions (Signed)
Thank you for allowing [place] to care for you today!  Await pathology results by mail, approx. 2 weeks.  Handout given for polyps.     YOU HAD AN ENDOSCOPIC PROCEDURE TODAY AT [facility]:   Refer to the procedure report that was given to you for any specific questions about what was found during the examination.  If the procedure report does not answer your questions, please call your gastroenterologist to clarify.  If you requested that your care partner not be given the details of your procedure findings, then the procedure report has been included in a sealed envelope for you to review at your convenience later.  YOU SHOULD EXPECT: Some feelings of bloating in the abdomen. Passage of more gas than usual.  Walking can help get rid of the air that was put into your GI tract during the procedure and reduce the bloating. If you had a lower endoscopy (such as a colonoscopy or flexible sigmoidoscopy) you may notice spotting of blood in your stool or on the toilet paper. If you underwent a bowel prep for your procedure, you may not have a normal bowel movement for a few days.  Please Note:  You might notice some irritation and congestion in your nose or some drainage.  This is from the oxygen used during your procedure.  There is no need for concern and it should clear up in a day or so.  SYMPTOMS TO REPORT IMMEDIATELY:   Following lower endoscopy (colonoscopy or flexible sigmoidoscopy):  Excessive amounts of blood in the stool  Significant tenderness or worsening of abdominal pains  Swelling of the abdomen that is new, acute  Fever of 100F or higher   For urgent or emergent issues, a gastroenterologist can be reached at any hour by calling [phone].   DIET:  We do recommend a small meal at first, but then you may proceed to your regular diet.  Drink plenty of fluids but you should avoid alcoholic beverages for 24 hours.  ACTIVITY:  You should plan to take it easy for  the rest of today and you should NOT DRIVE or use heavy machinery until tomorrow (because of the sedation medicines used during the test).    FOLLOW UP: Our staff will call the number listed on your records the next business day following your procedure to check on you and address any questions or concerns that you may have regarding the information given to you following your procedure. If we do not reach you, we will leave a message.  However, if you are feeling well and you are not experiencing any problems, there is no need to return our call.  We will assume that you have returned to your regular daily activities without incident.  If any biopsies were taken you will be contacted by phone or by letter within the next 1-3 weeks.  Please call us at [phone] if you have not heard about the biopsies in 3 weeks.    SIGNATURES/CONFIDENTIALITY: You and/or your care partner have signed paperwork which will be entered into your electronic medical record.  These signatures attest to the fact that that the information above on your After Visit Summary has been reviewed and is understood.  Full responsibility of the confidentiality of this discharge information lies with you and/or your care-partner.

## 2017-11-08 ENCOUNTER — Telehealth: Payer: Self-pay

## 2017-11-08 NOTE — Telephone Encounter (Signed)
  Follow up Call-  Call Evangaline Jou number 11/07/2017  Post procedure Call Arvella Massingale phone  # [phone]  Permission to leave phone message Yes  Some recent data might be hidden     Patient questions:  Do you have a fever, pain , or abdominal swelling? No. Pain Score  0 *  Have you tolerated food without any problems? Yes.    Have you been able to return to your normal activities? Yes.    Do you have any questions about your discharge instructions: Diet   No. Medications  No. Follow up visit  No.  Do you have questions or concerns about your Care? No.  Actions: * If pain score is 4 or above: No action needed, pain <4.

## 2017-11-09 ENCOUNTER — Ambulatory Visit (INDEPENDENT_AMBULATORY_CARE_PROVIDER_SITE_OTHER): Payer: PPO | Admitting: Internal Medicine

## 2017-11-09 VITALS — BP 106/62 | HR 64 | Temp 97.2°F | Resp 16 | Ht 70.0 in | Wt 181.2 lb

## 2017-11-09 DIAGNOSIS — E559 Vitamin D deficiency, unspecified: Secondary | ICD-10-CM | POA: Diagnosis not present

## 2017-11-09 DIAGNOSIS — N183 Chronic kidney disease, stage 3 unspecified: Secondary | ICD-10-CM

## 2017-11-09 DIAGNOSIS — I10 Essential (primary) hypertension: Secondary | ICD-10-CM

## 2017-11-09 DIAGNOSIS — Z79899 Other long term (current) drug therapy: Secondary | ICD-10-CM | POA: Diagnosis not present

## 2017-11-09 DIAGNOSIS — E782 Mixed hyperlipidemia: Secondary | ICD-10-CM | POA: Diagnosis not present

## 2017-11-09 DIAGNOSIS — M1000 Idiopathic gout, unspecified site: Secondary | ICD-10-CM | POA: Diagnosis not present

## 2017-11-09 DIAGNOSIS — E1122 Type 2 diabetes mellitus with diabetic chronic kidney disease: Secondary | ICD-10-CM | POA: Diagnosis not present

## 2017-11-09 NOTE — Progress Notes (Signed)
This very nice 67 y.o. WWM presents for 6 month follow up with HTN, HLD, Pre-Diabetes and Vitamin D Deficiency. Patient had recent (+) Cologard with Colonoscopy (05.21.2019) by Dr Loletha Carrow finding Adenomatous Polyps.  Patient also has hx/o Gout controlled on Allopurinol.     Patient is treated for HTN (2000)  & BP has been controlled at home. Today's BP is  106/62 - Sitting and 114/78 - standing. Patient has had no complaints of any cardiac type chest pain, palpitations, dyspnea / orthopnea / PND, dizziness, claudication, or dependent edema.     Patient is Intolerant to [employer] and Statins and his Hperlipidemia is not controlled with diet & Zetia.  (Patient had elevated LFT's on Zocor and Myalgias on Lipitor). Patient denies myalgias or other med SE's. 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  Last A1c was not at goal: Lab Results  Component Value Date   HGBA1C 7.0 (H) 11/09/2017      Further, the patient also has history of Vitamin D Deficiency ("14"/2010) and supplements vitamin D without any suspected side-effects. Last vitamin D was improved, but still not at goal (70-100): Lab Results  Component Value Date   VD25OH 61 11/09/2017   Current Outpatient Medications on File Prior to Visit  Medication Sig  . allopurinol (ZYLOPRIM) 300 MG tablet Take 1 tablet (300 mg total) by mouth daily.  Marland Kitchen BABY ASPIRIN PO Take 81 mg by mouth daily.  . bisoprolol-hydrochlorothiazide (ZIAC) 5-6.25 MG tablet TAKE ONE TABLET BY MOUTH DAILY FOR BLOOD PRESSURE  . Cholecalciferol (VITAMIN D PO) Take 5,000 Int'l Units by mouth daily.  . enalapril (VASOTEC) 20 MG tablet TAKE ONE TABLET BY MOUTH  DAILY  . ezetimibe (ZETIA) 10 MG tablet Take 1 tablet daily for Cholesterol  . metFORMIN (GLUCOPHAGE XR) 500 MG 24 hr tablet Take 1 tablet with breakfast, take 1 tablet with lunch and take 2 tablets with dinner.  . colchicine 0.6 MG tablet Take 1 to 2 tablets daily for gout attack and PRN.   No current facility-administered medications on file prior to visit.    Allergies  Allergen Reactions  . Crestor [Rosuvastatin]   . Welchol [Colesevelam Hcl]   . Zoloft [Sertraline Hcl]   . Penicillins Rash   PMHx:   Past Medical History:  Diagnosis Date  . Allergy   . Bone spur    Left Shoulder  . 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 ([place]) 07/18/2017  . Gout   . Hyperlipidemia   . Hypertension   . Other testicular hypofunction   . Type II or unspecified type diabetes mellitus without mention of complication, not stated as uncontrolled    Immunization History  Administered Date(s) Administered  . PPD Test 02/07/2014, 02/16/2015  . Pneumococcal Conjugate-13 04/12/2017  . Pneumococcal Polysaccharide-23 11/01/2010  . Td 12/25/2012  . Zoster 12/25/2012   Past Surgical History:  Procedure Laterality Date  . CYST REMOVAL HAND    . PROSTATE BIOPSY  2010  . WRIST SURGERY Right 1998   FHx:    Reviewed / unchanged  SHx:    Reviewed / unchanged  Systems Review:  Constitutional: Denies fever, chills, wt changes, headaches, insomnia, fatigue, night sweats, change in appetite. Eyes: Denies redness, blurred vision, diplopia, discharge, itchy, watery eyes.  ENT: Denies discharge, congestion, post nasal drip, epistaxis, sore throat, earache, hearing loss, dental pain, tinnitus, vertigo, sinus pain, snoring.  CV: Denies chest pain, palpitations, irregular heartbeat, syncope, dyspnea, diaphoresis, orthopnea, PND, claudication or edema. Respiratory: denies cough, dyspnea, DOE, pleurisy, hoarseness, laryngitis, wheezing.  Gastrointestinal: Denies dysphagia, odynophagia, heartburn, reflux,  water brash, abdominal pain or cramps, nausea, vomiting, bloating, diarrhea, constipation, hematemesis, melena, hematochezia  or hemorrhoids. Genitourinary: Denies dysuria, frequency, urgency, nocturia, hesitancy, discharge, hematuria or flank pain. Musculoskeletal: Denies arthralgias, myalgias, stiffness, jt. swelling, pain, limping or strain/sprain.  Skin: Denies pruritus, rash, hives, warts, acne, eczema or change in skin lesion(s). Neuro: No weakness, tremor, incoordination, spasms, paresthesia or pain. Psychiatric: Denies confusion, memory loss or sensory loss. Endo: Denies change in weight, skin or hair change.  Heme/Lymph: No excessive bleeding, bruising or enlarged lymph nodes.  Physical Exam  BP 106/62 Comment: 106/62-sit/114/78-standing  Pulse 64   Temp (!) 97.2 F (36.2 C)   Resp 16   Ht 5\' 10"  (1.778 m)   Wt 181 lb 3.2 oz (82.2 kg)   BMI 26.00 kg/m   Appears  well nourished, well groomed  and in no distress.  Eyes: PERRLA, EOMs, conjunctiva no swelling or erythema. Sinuses: No frontal/maxillary tenderness ENT/Mouth: EAC's clear, TM's nl w/o erythema, bulging. Nares clear w/o erythema, swelling, exudates. Oropharynx clear without erythema or exudates. Oral hygiene is good. Tongue normal, non obstructing. Hearing intact.  Neck: Supple. Thyroid not palpable. Car 2+/2+ without bruits, nodes or JVD. Chest: Respirations nl with BS clear & equal w/o rales, rhonchi, wheezing or stridor.  Cor: Heart sounds normal w/ regular rate and rhythm without sig. murmurs, gallops, clicks or rubs. Peripheral pulses normal and equal  without edema.  Abdomen: Soft & bowel sounds normal. Non-tender w/o guarding, rebound, hernias, masses or organomegaly.  Lymphatics: Unremarkable.  Musculoskeletal: Full ROM all peripheral extremities, joint stability, 5/5 strength and normal gait.  Skin: Warm, dry without exposed rashes, lesions or ecchymosis apparent.  Neuro: Cranial nerves intact, reflexes  equal bilaterally. Sensory-motor testing grossly intact. Tendon reflexes grossly intact.  Pysch: Alert & oriented x 3.  Insight and judgement nl & appropriate. No ideations.  Assessment and Plan:  1. Essential hypertension  - Continue medication, monitor blood pressure at home.  - Continue DASH diet.  Reminder to go to the ER if any CP,  SOB, nausea, dizziness, severe HA, changes vision/speech.  - CBC with Differential/Platelet - COMPLETE METABOLIC PANEL WITH GFR - Magnesium - TSH  2. Hyperlipidemia, mixed  -  Recommend stricter diet, exercise,& lifestyle modifications.  - Continue monitor periodic cholesterol/liver & renal functions   - Lipid panel - TSH  3. Type 2 diabetes mellitus with stage 3 chronic kidney disease, without long-term current use of insulin (HCC)  - Continue diet, exercise, lifestyle modifications.  - Monitor appropriate labs.  - Hemoglobin A1c - Insulin, random  4. Vitamin D deficiency  - Continue supplementation.  - VITAMIN D 25 Hydroxyl  5. Idiopathic gout  - Uric acid  6. Medication management  - CBC with Differential/Platelet - COMPLETE METABOLIC PANEL WITH GFR - Magnesium - Lipid panel - TSH - Hemoglobin A1c - Insulin, random - VITAMIN D 25 Hydroxyl - Uric acid      Discussed  regular exercise, BP monitoring, weight control to achieve/maintain BMI less than 25 and  discussed med and SE's. Recommended labs to assess and monitor clinical status with further disposition pending results of labs. Over 30 minutes of exam, counseling, chart review was performed.

## 2017-11-09 NOTE — Patient Instructions (Signed)

## 2017-11-10 LAB — LIPID PANEL
CHOL/HDL RATIO: 4.9 (calc) (ref <5.0)
CHOLESTEROL: 162 mg/dL (ref <200)
HDL: 33 mg/dL — ABNORMAL LOW (ref >40)
LDL CHOLESTEROL (CALC): 99 mg/dL
Non-HDL Cholesterol (Calc): 129 mg/dL (calc) (ref <130)
TRIGLYCERIDES: 210 mg/dL — AB (ref <150)

## 2017-11-10 LAB — CBC WITH DIFFERENTIAL/PLATELET
BASOS ABS: 68 {cells}/uL (ref 0–200)
Basophils Relative: 1 %
EOS PCT: 4.4 %
Eosinophils Absolute: 299 cells/uL (ref 15–500)
HEMATOCRIT: 41.2 % (ref 38.5–50.0)
Hemoglobin: 14.4 g/dL (ref 13.2–17.1)
LYMPHS ABS: 2006 {cells}/uL (ref 850–3900)
MCH: 29 pg (ref 27.0–33.0)
MCHC: 35 g/dL (ref 32.0–36.0)
MCV: 83.1 fL (ref 80.0–100.0)
MPV: 9.4 fL (ref 7.5–12.5)
Monocytes Relative: 8.8 %
NEUTROS PCT: 56.3 %
Neutro Abs: 3828 cells/uL (ref 1500–7800)
PLATELETS: 265 10*3/uL (ref 140–400)
RBC: 4.96 10*6/uL (ref 4.20–5.80)
RDW: 13.1 % (ref 11.0–15.0)
TOTAL LYMPHOCYTE: 29.5 %
WBC: 6.8 10*3/uL (ref 3.8–10.8)
WBCMIX: 598 {cells}/uL (ref 200–950)

## 2017-11-10 LAB — COMPLETE METABOLIC PANEL WITH GFR
AG Ratio: 1.9 (calc) (ref 1.0–2.5)
ALBUMIN MSPROF: 4.6 g/dL (ref 3.6–5.1)
ALKALINE PHOSPHATASE (APISO): 48 U/L (ref 40–115)
ALT: 33 U/L (ref 9–46)
AST: 27 U/L (ref 10–35)
BUN/Creatinine Ratio: 14 (calc) (ref 6–22)
BUN: 26 mg/dL — AB (ref 7–25)
CALCIUM: 9.7 mg/dL (ref 8.6–10.3)
CO2: 24 mmol/L (ref 20–32)
CREATININE: 1.86 mg/dL — AB (ref 0.70–1.25)
Chloride: 104 mmol/L (ref 98–110)
GFR, EST NON AFRICAN AMERICAN: 37 mL/min/{1.73_m2} — AB (ref >=60)
GFR, Est African American: 43 mL/min/{1.73_m2} — ABNORMAL LOW (ref >=60)
GLOBULIN: 2.4 g/dL (ref 1.9–3.7)
GLUCOSE: 139 mg/dL — AB (ref 65–99)
Potassium: 4.9 mmol/L (ref 3.5–5.3)
Sodium: 137 mmol/L (ref 135–146)
Total Bilirubin: 0.5 mg/dL (ref 0.2–1.2)
Total Protein: 7 g/dL (ref 6.1–8.1)

## 2017-11-10 LAB — INSULIN, RANDOM: Insulin: 17.8 u[IU]/mL (ref 2.0–19.6)

## 2017-11-10 LAB — URIC ACID: URIC ACID, SERUM: 5.4 mg/dL (ref 4.0–8.0)

## 2017-11-10 LAB — HEMOGLOBIN A1C
Hgb A1c MFr Bld: 7 % of total Hgb — ABNORMAL HIGH (ref <5.7)
Mean Plasma Glucose: 154 (calc)
eAG (mmol/L): 8.5 (calc)

## 2017-11-10 LAB — TSH: TSH: 1.64 mIU/L (ref 0.40–4.50)

## 2017-11-10 LAB — MAGNESIUM: MAGNESIUM: 1.7 mg/dL (ref 1.5–2.5)

## 2017-11-10 LAB — VITAMIN D 25 HYDROXY (VIT D DEFICIENCY, FRACTURES): VIT D 25 HYDROXY: 61 ng/mL (ref 30–100)

## 2017-11-12 ENCOUNTER — Encounter: Payer: Self-pay | Admitting: Internal Medicine

## 2017-11-15 ENCOUNTER — Encounter: Payer: Self-pay | Admitting: Gastroenterology

## 2017-11-23 ENCOUNTER — Telehealth: Payer: Self-pay | Admitting: *Deleted

## 2017-11-23 NOTE — Telephone Encounter (Signed)
Patient was advised of lab results

## 2017-12-16 ENCOUNTER — Other Ambulatory Visit: Payer: Self-pay | Admitting: Internal Medicine

## 2017-12-18 DIAGNOSIS — Z85828 Personal history of other malignant neoplasm of skin: Secondary | ICD-10-CM | POA: Diagnosis not present

## 2017-12-18 DIAGNOSIS — L821 Other seborrheic keratosis: Secondary | ICD-10-CM | POA: Diagnosis not present

## 2017-12-18 DIAGNOSIS — L570 Actinic keratosis: Secondary | ICD-10-CM | POA: Diagnosis not present

## 2017-12-18 DIAGNOSIS — L309 Dermatitis, unspecified: Secondary | ICD-10-CM | POA: Diagnosis not present

## 2018-01-12 DIAGNOSIS — M25612 Stiffness of left shoulder, not elsewhere classified: Secondary | ICD-10-CM | POA: Diagnosis not present

## 2018-01-17 DIAGNOSIS — M25512 Pain in left shoulder: Secondary | ICD-10-CM | POA: Diagnosis not present

## 2018-02-05 DIAGNOSIS — M25512 Pain in left shoulder: Secondary | ICD-10-CM | POA: Diagnosis not present

## 2018-02-07 NOTE — Progress Notes (Signed)
FOLLOW UP  Assessment and Plan:   Hypertension Well controlled with current medications  Monitor blood pressure at home; patient to call if consistently greater than 130/80 Continue DASH diet.   Reminder to go to the ER if any CP, SOB, nausea, dizziness, severe HA, changes vision/speech, left arm numbness and tingling and jaw pain.  Cholesterol Currently above goal on zetia; statin/welchol intolerant -  Continue low cholesterol diet and exercise.  Check lipid panel.   Diabetes with diabetic chronic kidney disease Continue medication: metformin Continue diet and exercise.  Perform daily foot/skin check, notify office of any concerning changes.  Check A1C  Overweight Long discussion about weight loss, diet, and exercise Recommended diet heavy in fruits and veggies and low in animal meats, cheeses, and dairy products, appropriate calorie intake Discussed ideal weight for height  Patient is currently working on lifestyle and doing well with weight loss Will follow up in 3 months  Vitamin D Def Near goal at last visit; continue supplementation Defer Vit D level  Gout Continue allopurinol Diet discussed Check uric acid as needed   Continue diet and meds as discussed. Further disposition pending results of labs. Discussed med's effects and SE's.   Over 30 minutes of exam, counseling, chart review, and critical decision making was performed.   Future Appointments  Date Time Provider [place]  05/29/2018 10:00 AM Unk Pinto, MD [facility] None    ----------------------------------------------------------------------------------------------------------------------  HPI 67 y.o. male  presents for 3 month follow up on hypertension, cholesterol, diabetes, weight, gout and vitamin D deficiency. He is scheduled to have left arthroscopic surgery by Dr. Norm Salt for bone spur and suspected tear of rotator cuff.   BMI is Body mass index is 25.11 kg/m., he has been  working on diet, doesn't exercise but walks extensively while at work at [employer].  Wt Readings from Last 3 Encounters:  02/09/18 175 lb (79.4 kg)  11/09/17 181 lb 3.2 oz (82.2 kg)  11/07/17 186 lb (84.4 kg)   His blood pressure has been controlled at home, today their BP is BP: 120/76  He does not workout. He denies chest pain, shortness of breath, dizziness.   He is on cholesterol medication (zetia 10 mg daily, hx of statin, welchol intolerance) and denies myalgias. His cholesterol is not at goal. The cholesterol last visit was:   Lab Results  Component Value Date   CHOL 162 11/09/2017   HDL 33 (L) 11/09/2017   LDLCALC 99 11/09/2017   TRIG 210 (H) 11/09/2017   CHOLHDL 4.9 11/09/2017    He has been working on diet and exercise for T2DM, and denies foot ulcerations, increased appetite, nausea, polydipsia, polyuria, visual disturbances, vomiting and weight loss. He has been checking sugars sporadically and reports values ranging 60-120 checking throughout the day. Denies hypoglycemia symptoms. Last A1C in the office was:  Lab Results  Component Value Date   HGBA1C 7.0 (H) 11/09/2017   Patient is on Vitamin D supplement.   Lab Results  Component Value Date   VD25OH 61 11/09/2017     Patient is on allopurinol for gout and does not report a recent flare.  Lab Results  Component Value Date   LABURIC 5.4 11/09/2017     Current Medications:  Current Outpatient Medications on File Prior to Visit  Medication Sig  . allopurinol (ZYLOPRIM) 300 MG tablet Take 1 tablet (300 mg total) by mouth daily.  Marland Kitchen BABY ASPIRIN PO Take 81 mg by mouth daily.  . bisoprolol-hydrochlorothiazide [facility]) 5-6.25  MG tablet TAKE ONE TABLET BY MOUTH DAILY FOR BLOOD PRESSURE  . Cholecalciferol (VITAMIN D PO) Take 5,000 Int'l Units by mouth daily.  . colchicine 0.6 MG tablet Take 1 to 2 tablets daily for gout attack and PRN.  . enalapril (VASOTEC) 20 MG tablet TAKE ONE TABLET BY MOUTH DAILY  . ezetimibe  (ZETIA) 10 MG tablet Take 1 tablet daily for Cholesterol  . metFORMIN (GLUCOPHAGE XR) 500 MG 24 hr tablet Take 1 tablet with breakfast, take 1 tablet with lunch and take 2 tablets with dinner.   No current facility-administered medications on file prior to visit.      Allergies:  Allergies  Allergen Reactions  . Crestor [Rosuvastatin]   . Welchol [Colesevelam Hcl]   . Zoloft [Sertraline Hcl]   . Penicillins Rash     Medical History:  Past Medical History:  Diagnosis Date  . Allergy   . Bone spur    Left Shoulder  . 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 ([place]) 07/18/2017  . Gout   . Hyperlipidemia   . Hypertension   . Other testicular hypofunction   . Type II or unspecified type diabetes mellitus without mention of complication, not stated as uncontrolled    Family history- Reviewed and unchanged Social history- Reviewed and unchanged   Review of Systems:  Review of Systems  Constitutional: Negative for malaise/fatigue and weight loss.  HENT: Negative for hearing loss and tinnitus.   Eyes: Negative for blurred vision and double vision.  Respiratory: Negative for cough, shortness of breath and wheezing.   Cardiovascular: Negative for chest pain, palpitations, orthopnea, claudication and leg swelling.  Gastrointestinal: Negative for abdominal pain, blood in stool, constipation, diarrhea, heartburn, melena, nausea and vomiting.  Genitourinary: Negative.   Musculoskeletal: Negative for joint pain and myalgias.  Skin: Negative for rash.  Neurological: Negative for dizziness, tingling, sensory change, weakness and headaches.  Endo/Heme/Allergies: Negative for polydipsia.  Psychiatric/Behavioral: Negative.   All other systems reviewed and are negative.    Physical Exam: BP 120/76   Pulse (!) 57   Temp 97.9 F (36.6 C)   Ht 5\' 10"  (1.778 m)   Wt 175 lb (79.4 kg)   SpO2 99%   BMI 25.11 kg/m  Wt Readings from Last 3 Encounters:  02/09/18 175 lb (79.4 kg)   11/09/17 181 lb 3.2 oz (82.2 kg)  11/07/17 186 lb (84.4 kg)   General Appearance: Well nourished, in no apparent distress. Eyes: PERRLA, EOMs, conjunctiva no swelling or erythema Sinuses: No Frontal/maxillary tenderness ENT/Mouth: Ext aud canals clear, TMs without erythema, bulging. No erythema, swelling, or exudate on post pharynx.  Tonsils not swollen or erythematous. Hearing normal.  Neck: Supple, thyroid normal.  Respiratory: Respiratory effort normal, BS equal bilaterally without rales, rhonchi, wheezing or stridor.  Cardio: RRR with no MRGs. Brisk peripheral pulses without edema.  Abdomen: Soft, + BS.  Non tender, no guarding, rebound, hernias, masses. Lymphatics: Non tender without lymphadenopathy.  Musculoskeletal: Full ROM, 5/5 strength, Normal gait Skin: Warm, dry without rashes, lesions, ecchymosis.  Neuro: Cranial nerves intact. No cerebellar symptoms.  Psych: Awake and oriented X 3, normal affect, Insight and Judgment appropriate.    Izora Ribas, NP 11:22 AM [facility]

## 2018-02-09 ENCOUNTER — Ambulatory Visit (INDEPENDENT_AMBULATORY_CARE_PROVIDER_SITE_OTHER): Payer: PPO | Admitting: Adult Health

## 2018-02-09 ENCOUNTER — Encounter: Payer: Self-pay | Admitting: Adult Health

## 2018-02-09 VITALS — BP 120/76 | HR 57 | Temp 97.9°F | Ht 70.0 in | Wt 175.0 lb

## 2018-02-09 DIAGNOSIS — E663 Overweight: Secondary | ICD-10-CM

## 2018-02-09 DIAGNOSIS — E1122 Type 2 diabetes mellitus with diabetic chronic kidney disease: Secondary | ICD-10-CM

## 2018-02-09 DIAGNOSIS — E559 Vitamin D deficiency, unspecified: Secondary | ICD-10-CM

## 2018-02-09 DIAGNOSIS — M1000 Idiopathic gout, unspecified site: Secondary | ICD-10-CM | POA: Diagnosis not present

## 2018-02-09 DIAGNOSIS — E782 Mixed hyperlipidemia: Secondary | ICD-10-CM

## 2018-02-09 DIAGNOSIS — I10 Essential (primary) hypertension: Secondary | ICD-10-CM | POA: Diagnosis not present

## 2018-02-09 DIAGNOSIS — N183 Chronic kidney disease, stage 3 (moderate): Secondary | ICD-10-CM

## 2018-02-09 DIAGNOSIS — Z79899 Other long term (current) drug therapy: Secondary | ICD-10-CM

## 2018-02-09 NOTE — Patient Instructions (Signed)
Preventing Cerebrovascular Disease Arteries are blood vessels that carry blood that contains oxygen from the heart to all parts of the body. Cerebrovascular disease affects arteries that supply the brain. Any condition that blocks or disrupts blood flow to the brain can cause cerebrovascular disease. Brain cells that lose blood supply start to die within minutes (stroke). Stroke is the main danger of cerebrovascular disease. Atherosclerosis and high blood pressure are common causes of cerebrovascular disease. Atherosclerosis is narrowing and hardening of an artery that results when fat, cholesterol, calcium, or other substances (plaque) build up inside an artery. Plaque reduces blood flow through the artery. High blood pressure increases the risk of bleeding inside the brain. Making diet and lifestyle changes to prevent atherosclerosis and high blood pressure lowers your risk of cerebrovascular disease. What nutrition changes can be made?  Eat more fruits, vegetables, and whole grains.  Reduce how much saturated fat you eat. To do this, eat less red meat and fewer full-fat dairy products.  Eat healthy proteins instead of red meat. Healthy proteins include: ? Fish. Eat fish that contains heart-healthy omega-3 fatty acids, twice a week. Examples include salmon, albacore tuna, mackerel, and herring. ? Chicken. ? Nuts. ? Low-fat or nonfat yogurt.  Avoid processed meats, like bacon and lunchmeat.  Avoid foods that contain: ? A lot of sugar, such as sweets and drinks with added sugar. ? A lot of salt (sodium). Avoid adding extra salt to your food, as told by your health care provider. ? Trans fats, such as margarine and baked goods. Trans fats may be listed as "partially hydrogenated oils" on food labels.  Check food labels to see how much sodium, sugar, and trans fats are in foods.  Use vegetable oils that contain low amounts of saturated fat, such as olive oil or canola oil. What lifestyle  changes can be made?  Drink alcohol in moderation. This means no more than 1 drink a day for nonpregnant women and 2 drinks a day for men. One drink equals 12 oz of beer, 5 oz of wine, or 1 oz of hard liquor.  If you are overweight, ask your health care provider to recommend a weight-loss plan for you. Losing 5-10 lb (2.2-4.5 kg) can reduce your risk of diabetes, atherosclerosis, and high blood pressure.  Exercise for 30?60 minutes on most days, or as much as told by your health care provider. ? Do moderate-intensity exercise, such as brisk walking, bicycling, and water aerobics. Ask your health care provider which activities are safe for you.  Do not use any products that contain nicotine or tobacco, such as cigarettes and e-cigarettes. If you need help quitting, ask your health care provider. Why are these changes important? Making these changes lowers your risk of many diseases that can cause cerebrovascular disease and stroke. Stroke is a leading cause of death and disability. Making these changes also improves your overall health and quality of life. What can I do to lower my risk? The following factors make you more likely to develop cerebrovascular disease:  Being overweight.  Smoking.  Being physically inactive.  Eating a high-fat diet.  Having certain health conditions, such as: ? Diabetes. ? High blood pressure. ? Heart disease. ? Atherosclerosis. ? High cholesterol. ? Sickle cell disease.  Talk with your health care provider about your risk for cerebrovascular disease. Work with your health care provider to control diseases that you have that may contribute to cerebrovascular disease. Your health care provider may prescribe medicines to help prevent   major causes of cerebrovascular disease. Where to find more information: Learn more about preventing cerebrovascular disease from:  [place], Lung, and [place]:  [employer].de  Centers for Disease Control and Prevention: http://www.curry-wood.biz/  Summary  Cerebrovascular disease can lead to a stroke.  Atherosclerosis and high blood pressure are major causes of cerebrovascular disease.  Making diet and lifestyle changes can reduce your risk of cerebrovascular disease.  Work with your health care provider to get your risk factors under control to reduce your risk of cerebrovascular disease. This information is not intended to replace advice given to you by your health care provider. Make sure you discuss any questions you have with your health care provider. Document Released: 06/21/2015 Document Revised: 12/25/2015 Document Reviewed: 06/21/2015 Elsevier Interactive Patient Education  2018 [employer].      Diabetic Neuropathy Diabetic neuropathy is a nerve disease or nerve damage that is caused by diabetes mellitus. About half of all people with diabetes mellitus have some form of nerve damage. Nerve damage is more common in those who have had diabetes mellitus for many years and who generally have not had good control of their blood sugar (glucose) level. Diabetic neuropathy is a common complication of diabetes mellitus. There are three common types of diabetic neuropathy and a fourth type that is less common and less understood:  Peripheral neuropathy-This is the most common type of diabetic neuropathy. It causes damage to the nerves of the feet and legs first and then eventually the hands and arms. The damage affects the ability to sense touch.  Autonomic neuropathy-This type causes damage to the autonomic nervous system, which controls the following functions: ? Heartbeat. ? Body temperature. ? Blood pressure. ? Urination. ? Digestion. ? Sweating. ? Sexual function.  Focal neuropathy-Focal neuropathy can be painful and unpredictable and occurs most often in older adults with diabetes mellitus. It  involves a specific nerve or one area and often comes on suddenly. It usually does not cause long-term problems.  Radiculoplexus neuropathy- Sometimes called lumbosacral radiculoplexus neuropathy, radiculoplexus neuropathy affects the nerves of the thighs, hips, buttocks, or legs. It is more common in people with type 2 diabetes mellitus and in older men. It is characterized by debilitating pain, weakness, and atrophy, usually in the thigh muscles.  What are the causes? The cause of peripheral, autonomic, and focal neuropathies is diabetes mellitus that is uncontrolled and high glucose levels. The cause of radiculoplexus neuropathy is unknown. However, it is thought to be caused by inflammation related to uncontrolled glucose levels. What are the signs or symptoms? Peripheral Neuropathy Peripheral neuropathy develops slowly over time. When the nerves of the feet and legs no longer work there may be:  Burning, stabbing, or aching pain in the legs or feet.  Inability to feel pressure or pain in your feet. This can lead to: ? Thick calluses over pressure areas. ? Pressure sores. ? Ulcers.  Foot deformities.  Reduced ability to feel temperature changes.  Muscle weakness.  Autonomic Neuropathy The symptoms of autonomic neuropathy vary depending on which nerves are affected. Symptoms may include:  Problems with digestion, such as: ? Feeling sick to your stomach (nausea). ? Vomiting. ? Bloating. ? Constipation. ? Diarrhea. ? Abdominal pain.  Difficulty with urination. This occurs if you lose your ability to sense when your bladder is full. Problems include: ? Urine leakage (incontinence). ? Inability to empty your bladder completely (retention).  Rapid or irregular heartbeat (palpitations).  Blood pressure drops when you stand up (orthostatic hypotension).  When you stand up you may feel: ? Dizzy. ? Weak. ? Faint.  In men, inability to attain and maintain an erection.  In  women, vaginal dryness and problems with decreased sexual desire and arousal.  Problems with body temperature regulation.  Increased or decreased sweating.  Focal Neuropathy  Abnormal eye movements or abnormal alignment of both eyes.  Weakness in the wrist.  Foot drop. This results in an inability to lift the foot properly and abnormal walking or foot movement.  Paralysis on one side of your face (Bell palsy).  Chest or abdominal pain. Radiculoplexus Neuropathy  Sudden, severe pain in your hip, thigh, or buttocks.  Weakness and wasting of thigh muscles.  Difficulty rising from a seated position.  Abdominal swelling.  Unexplained weight loss (usually more than 10 lb [4.5 kg]). How is this diagnosed? Peripheral Neuropathy Your senses may be tested. Sensory function testing can be done with:  A light touch using a monofilament.  A vibration with tuning fork.  A sharp sensation with a pin prick.  Other tests that can help diagnose neuropathy are:  Nerve conduction velocity. This test checks the transmission of an electrical current through a nerve.  Electromyography. This shows how muscles respond to electrical signals transmitted by nearby nerves.  Quantitative sensory testing. This is used to assess how your nerves respond to vibrations and changes in temperature.  Autonomic Neuropathy Diagnosis is often based on reported symptoms. Tell your health care provider if you experience:  Dizziness.  Constipation.  Diarrhea.  Inappropriate urination or inability to urinate.  Inability to get or maintain an erection.  Tests that may be done include:  Electrocardiography or Holter monitor. These are tests that can help show problems with the heart rate or heart rhythm.  An X-ray exam may be done.  Focal Neuropathy Diagnosis is made based on your symptoms and what your health care provider finds during your exam. Other tests may be done. They may  include:  Nerve conduction velocities. This checks the transmission of electrical current through a nerve.  Electromyography. This shows how muscles respond to electrical signals transmitted by nearby nerves.  Quantitative sensory testing. This test is used to assess how your nerves respond to vibration and changes in temperature.  Radiculoplexus Neuropathy  Often the first thing is to eliminate any other issue or problems that might be the cause, as there is no standard test for diagnosis.  X-ray exam of your spine and lumbar region.  Spinal tap to rule out cancer.  MRI to rule out other lesions. How is this treated? Once nerve damage occurs, it cannot be reversed. The goal of treatment is to keep the disease or nerve damage from getting worse and affecting more nerve fibers. Controlling your blood glucose level is the key. Most people with radiculoplexus neuropathy see at least a partial improvement over time. You will need to keep your blood glucose and HbA1c levels in the target range determined by your health care provider. Things that help control blood glucose levels include:  Blood glucose monitoring.  Meal planning.  Physical activity.  Diabetes medicine.  Over time, maintaining lower blood glucose levels helps lessen symptoms. Sometimes, prescription pain medicine is needed. Follow these instructions at home:  Do not smoke.  Keep your blood glucose level in the range that you and your health care provider have determined acceptable for you.  Keep your blood pressure level in the range that you and your health care provider have determined acceptable  for you.  Eat a well-balanced diet.  Be physically active every day. Include strength training and balance exercises.  Protect your feet. ? Check your feet every day for sores, cuts, blisters, or signs of infection. ? Wear padded socks and supportive shoes. Use orthotic inserts, if necessary. ? Regularly check the  insides of your shoes for worn spots. Make sure there are no rocks or other items inside your shoes before you put them on. Contact a health care provider if:  You have burning, stabbing, or aching pain in the legs or feet.  You are unable to feel pressure or pain in your feet.  You develop problems with digestion such as: ? Nausea. ? Vomiting. ? Bloating. ? Constipation. ? Diarrhea. ? Abdominal pain.  You have difficulty with urination, such as: ? Incontinence. ? Retention.  You have palpitations.  You develop orthostatic hypotension. When you stand up you may feel: ? Dizzy. ? Weak. ? Faint.  You cannot attain and maintain an erection (in men).  You have vaginal dryness and problems with decreased sexual desire and arousal (in women).  You have severe pain in your thighs, legs, or buttocks.  You have unexplained weight loss. This information is not intended to replace advice given to you by your health care provider. Make sure you discuss any questions you have with your health care provider. Document Released: 08/15/2001 Document Revised: 11/12/2015 Document Reviewed: 11/15/2012 Elsevier Interactive Patient Education  2017 [employer].

## 2018-02-10 LAB — COMPLETE METABOLIC PANEL WITH GFR
AG RATIO: 2 (calc) (ref 1.0–2.5)
ALBUMIN MSPROF: 4.9 g/dL (ref 3.6–5.1)
ALT: 29 U/L (ref 9–46)
AST: 24 U/L (ref 10–35)
Alkaline phosphatase (APISO): 46 U/L (ref 40–115)
BUN / CREAT RATIO: 19 (calc) (ref 6–22)
BUN: 32 mg/dL — ABNORMAL HIGH (ref 7–25)
CALCIUM: 10.1 mg/dL (ref 8.6–10.3)
CO2: 25 mmol/L (ref 20–32)
CREATININE: 1.7 mg/dL — AB (ref 0.70–1.25)
Chloride: 104 mmol/L (ref 98–110)
GFR, EST NON AFRICAN AMERICAN: 41 mL/min/{1.73_m2} — AB (ref >=60)
GFR, Est African American: 48 mL/min/{1.73_m2} — ABNORMAL LOW (ref >=60)
GLOBULIN: 2.5 g/dL (ref 1.9–3.7)
Glucose, Bld: 122 mg/dL — ABNORMAL HIGH (ref 65–99)
POTASSIUM: 5.1 mmol/L (ref 3.5–5.3)
SODIUM: 138 mmol/L (ref 135–146)
Total Bilirubin: 0.7 mg/dL (ref 0.2–1.2)
Total Protein: 7.4 g/dL (ref 6.1–8.1)

## 2018-02-10 LAB — CBC WITH DIFFERENTIAL/PLATELET
BASOS PCT: 1.1 %
Basophils Absolute: 84 cells/uL (ref 0–200)
Eosinophils Absolute: 403 cells/uL (ref 15–500)
Eosinophils Relative: 5.3 %
HCT: 45.2 % (ref 38.5–50.0)
HEMOGLOBIN: 15.2 g/dL (ref 13.2–17.1)
LYMPHS ABS: 1930 {cells}/uL (ref 850–3900)
MCH: 28.3 pg (ref 27.0–33.0)
MCHC: 33.6 g/dL (ref 32.0–36.0)
MCV: 84.2 fL (ref 80.0–100.0)
MPV: 9.7 fL (ref 7.5–12.5)
Monocytes Relative: 7.8 %
Neutro Abs: 4590 cells/uL (ref 1500–7800)
Neutrophils Relative %: 60.4 %
PLATELETS: 291 10*3/uL (ref 140–400)
RBC: 5.37 10*6/uL (ref 4.20–5.80)
RDW: 12.8 % (ref 11.0–15.0)
TOTAL LYMPHOCYTE: 25.4 %
WBC: 7.6 10*3/uL (ref 3.8–10.8)
WBCMIX: 593 {cells}/uL (ref 200–950)

## 2018-02-10 LAB — HEMOGLOBIN A1C
EAG (MMOL/L): 8.5 (calc)
Hgb A1c MFr Bld: 7 % of total Hgb — ABNORMAL HIGH (ref <5.7)
MEAN PLASMA GLUCOSE: 154 (calc)

## 2018-02-10 LAB — LIPID PANEL
CHOL/HDL RATIO: 5.3 (calc) — AB (ref <5.0)
Cholesterol: 197 mg/dL (ref <200)
HDL: 37 mg/dL — ABNORMAL LOW (ref >40)
LDL CHOLESTEROL (CALC): 126 mg/dL — AB
NON-HDL CHOLESTEROL (CALC): 160 mg/dL — AB (ref <130)
Triglycerides: 206 mg/dL — ABNORMAL HIGH (ref <150)

## 2018-02-10 LAB — TSH: TSH: 1.07 mIU/L (ref 0.40–4.50)

## 2018-02-10 LAB — MAGNESIUM: Magnesium: 1.8 mg/dL (ref 1.5–2.5)

## 2018-02-15 DIAGNOSIS — M7542 Impingement syndrome of left shoulder: Secondary | ICD-10-CM | POA: Diagnosis not present

## 2018-02-15 DIAGNOSIS — G8918 Other acute postprocedural pain: Secondary | ICD-10-CM | POA: Diagnosis not present

## 2018-02-15 DIAGNOSIS — M75112 Incomplete rotator cuff tear or rupture of left shoulder, not specified as traumatic: Secondary | ICD-10-CM | POA: Diagnosis not present

## 2018-02-15 DIAGNOSIS — M24112 Other articular cartilage disorders, left shoulder: Secondary | ICD-10-CM | POA: Diagnosis not present

## 2018-02-15 DIAGNOSIS — M24612 Ankylosis, left shoulder: Secondary | ICD-10-CM | POA: Diagnosis not present

## 2018-02-23 DIAGNOSIS — Z9889 Other specified postprocedural states: Secondary | ICD-10-CM | POA: Diagnosis not present

## 2018-02-28 DIAGNOSIS — M25612 Stiffness of left shoulder, not elsewhere classified: Secondary | ICD-10-CM | POA: Diagnosis not present

## 2018-03-02 DIAGNOSIS — M25612 Stiffness of left shoulder, not elsewhere classified: Secondary | ICD-10-CM | POA: Diagnosis not present

## 2018-03-07 DIAGNOSIS — M25612 Stiffness of left shoulder, not elsewhere classified: Secondary | ICD-10-CM | POA: Diagnosis not present

## 2018-03-09 DIAGNOSIS — M25612 Stiffness of left shoulder, not elsewhere classified: Secondary | ICD-10-CM | POA: Diagnosis not present

## 2018-03-14 DIAGNOSIS — M25612 Stiffness of left shoulder, not elsewhere classified: Secondary | ICD-10-CM | POA: Diagnosis not present

## 2018-03-16 DIAGNOSIS — M25612 Stiffness of left shoulder, not elsewhere classified: Secondary | ICD-10-CM | POA: Diagnosis not present

## 2018-03-16 DIAGNOSIS — Z9889 Other specified postprocedural states: Secondary | ICD-10-CM | POA: Diagnosis not present

## 2018-03-22 ENCOUNTER — Other Ambulatory Visit: Payer: Self-pay | Admitting: Internal Medicine

## 2018-03-26 ENCOUNTER — Other Ambulatory Visit: Payer: Self-pay | Admitting: Internal Medicine

## 2018-03-26 DIAGNOSIS — E782 Mixed hyperlipidemia: Secondary | ICD-10-CM

## 2018-05-18 ENCOUNTER — Encounter: Payer: Self-pay | Admitting: Internal Medicine

## 2018-05-26 ENCOUNTER — Encounter: Payer: Self-pay | Admitting: Internal Medicine

## 2018-05-26 NOTE — Patient Instructions (Signed)

## 2018-05-26 NOTE — Progress Notes (Signed)
[facility]   Unk Pinto, M.D.     Uvaldo Bristle. Silverio Lay, P.A.-C Liane Comber, [place]                [place], [place] [address] Telephone [phone] Telefax [phone] Annual  Screening/Preventative Visit  & Comprehensive Evaluation & Examination     This very nice 67 y.o. WM presents for a Screening /Preventative Visit & comprehensive evaluation and management of multiple medical co-morbidities.  Patient has been followed for HTN, HLD, T2_DM and Vitamin D Deficiency.  Patient has hx/o Gout and apparently controlled on his Allopurinol.      HTN predates circa 2000. Patient's BP has been controlled at home.  Today's BP was initially elevated and rechecked at goal - 136/82. Patient denies any cardiac symptoms as chest pain, palpitations, shortness of breath, dizziness or ankle swelling.     Patient's hyperlipidemia is controlled with diet and medications. Patient denies myalgias or other medication SE's. Last lipids were not at goal: Lab Results  Component Value Date   CHOL 184 05/29/2018   HDL 37 (L) 05/29/2018   LDLCALC 119 (H) 05/29/2018   TRIG 162 (H) 05/29/2018   CHOLHDL 5.0 (H) 05/29/2018      Patient has hx/o T2_NIDDM/CKD3 (GFR 49)  Circa 2002 and patient denies reactive hypoglycemic symptoms, visual blurring or diabetic polys, but does c/o painful burning paresthesias of his feet & toes at night. Last A1c was not at goal: Lab Results  Component Value Date   HGBA1C 6.0 (H) 05/29/2018       Finally, patient has history of Vitamin D Deficiency ("14" / 2010) and last vitamin D was at goal: Lab Results  Component Value Date   VD25OH 56 05/29/2018   Current Outpatient Medications on File Prior to Visit  Medication Sig  . allopurinol (ZYLOPRIM) 300 MG tablet Take 1 tablet (300 mg total) by mouth daily.  Marland Kitchen BABY ASPIRIN PO Take 81 mg by mouth daily.  .  bisoprolol-hydrochlorothiazide (ZIAC) 5-6.25 MG tablet TAKE ONE TABLET BY MOUTH DAILY FOR BLOOD PRESSURE  . Cholecalciferol (VITAMIN D PO) Take 5,000 Int'l Units by mouth daily.  . enalapril (VASOTEC) 20 MG tablet TAKE ONE TABLET BY MOUTH DAILY  . ezetimibe (ZETIA) 10 MG tablet TAKE ONE TABLET BY MOUTH DAILY FOR CHOLESTEROL  . metFORMIN (GLUCOPHAGE XR) 500 MG 24 hr tablet Take 1 tablet with breakfast, take 1 tablet with lunch and take 2 tablets with dinner.  . colchicine 0.6 MG tablet Take 1 to 2 tablets daily for gout attack and PRN.   No current facility-administered medications on file prior to visit.    Allergies  Allergen Reactions  . Crestor [Rosuvastatin]   . Welchol [Colesevelam Hcl]   . Zoloft [Sertraline Hcl]   . Penicillins Rash   Past Medical History:  Diagnosis Date  . Allergy   . Bone spur    Left Shoulder  . 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 ([place]) 07/18/2017  . Gout   . Hyperlipidemia   . Hypertension   . Other testicular hypofunction   . Type II or unspecified type diabetes mellitus without mention of complication, not stated as uncontrolled    Health Maintenance  Topic Date Due  . FOOT EXAM  04/12/2018  . INFLUENZA VACCINE  04/21/2019 (Originally 01/18/2018)  . OPHTHALMOLOGY EXAM  07/21/2018  .  HEMOGLOBIN A1C  11/28/2018  . COLONOSCOPY  11/07/2020  . TETANUS/TDAP  12/26/2022  . Hepatitis C Screening  Completed  . PNA vac Low Risk Adult  Completed   Immunization History  Administered Date(s) Administered  . PPD Test 02/07/2014, 02/16/2015  . Pneumococcal Conjugate-13 04/12/2017  . Pneumococcal Polysaccharide-23 11/01/2010, 05/29/2018  . Td 12/25/2012  . Zoster 12/25/2012   Last Colon - 11/07/2017 - Dr Loletha Carrow - polyps - recc 3 year f/u - due May 2022  Past Surgical History:  Procedure Laterality Date  . CYST REMOVAL HAND    . PROSTATE BIOPSY  2010  . WRIST SURGERY Right 1998   Family History  Problem Relation Age of Onset  . Heart disease  Father   . Kidney disease Father   . Heart failure Father   . Heart disease Mother 49       stent  . Colon cancer Neg Hx   . Esophageal cancer Neg Hx   . Liver cancer Neg Hx   . Pancreatic cancer Neg Hx   . Rectal cancer Neg Hx   . Stomach cancer Neg Hx    Social History   Socioeconomic History  . Marital status: Single  . Number of children: 3  Occupational History  . Semi-retired  Tobacco Use  . Smoking status: Never Smoker  . Smokeless tobacco: Never Used  Substance and Sexual Activity  . Alcohol use: No  . Drug use: No  . Sexual activity: Not on file    ROS Constitutional: Denies fever, chills, weight loss/gain, headaches, insomnia,  night sweats or change in appetite. Does c/o fatigue. Eyes: Denies redness, blurred vision, diplopia, discharge, itchy or watery eyes.  ENT: Denies discharge, congestion, post nasal drip, epistaxis, sore throat, earache, hearing loss, dental pain, Tinnitus, Vertigo, Sinus pain or snoring.  Cardio: Denies chest pain, palpitations, irregular heartbeat, syncope, dyspnea, diaphoresis, orthopnea, PND, claudication or edema Respiratory: denies cough, dyspnea, DOE, pleurisy, hoarseness, laryngitis or wheezing.  Gastrointestinal: Denies dysphagia, heartburn, reflux, water brash, pain, cramps, nausea, vomiting, bloating, diarrhea, constipation, hematemesis, melena, hematochezia, jaundice or hemorrhoids Genitourinary: Denies dysuria, frequency, urgency, nocturia, hesitancy, discharge, hematuria or flank pain Musculoskeletal: Denies arthralgia, myalgia, stiffness, Jt. Swelling, pain, limp or strain/sprain. Denies Falls. Skin: Denies puritis, rash, hives, warts, acne, eczema or change in skin lesion Neuro: No weakness, tremor, incoordination, spasms, paresthesia or pain Psychiatric: Denies confusion, memory loss or sensory loss. Denies Depression. Endocrine: Denies change in weight, skin, hair change, nocturia, and paresthesia, diabetic polys, visual  blurring or hyper / hypo glycemic episodes.  Heme/Lymph: No excessive bleeding, bruising or enlarged lymph nodes.  Physical Exam  BP 136/82   Pulse 60   Temp 97.7 F (36.5 C)   Resp 16   Ht 5' 9.25" (1.759 m)   Wt 172 lb 6.4 oz (78.2 kg)   BMI 25.28 kg/m   General Appearance: Well nourished and well groomed and in no apparent distress.  Eyes: PERRLA, EOMs, conjunctiva no swelling or erythema, normal fundi and vessels. Sinuses: No frontal/maxillary tenderness ENT/Mouth: EACs patent / TMs  nl. Nares clear without erythema, swelling, mucoid exudates. Oral hygiene is good. No erythema, swelling, or exudate. Tongue normal, non-obstructing. Tonsils not swollen or erythematous. Hearing normal.  Neck: Supple, thyroid not palpable. No bruits, nodes or JVD. Respiratory: Respiratory effort normal.  BS equal and clear bilateral without rales, rhonci, wheezing or stridor. Cardio: Heart sounds are normal with regular rate and rhythm and no murmurs, rubs or gallops. Peripheral pulses are normal  and equal bilaterally without edema. No aortic or femoral bruits. Chest: symmetric with normal excursions and percussion.  Abdomen: Soft, with Nl bowel sounds. Nontender, no guarding, rebound, hernias, masses, or organomegaly.  Lymphatics: Non tender without lymphadenopathy.  Genitourinary: No hernias.Testes nl. DRE - prostate nl for age - smooth & firm w/o nodules. Musculoskeletal: Full ROM all peripheral extremities, joint stability, 5/5 strength, and normal gait. Skin: Warm and dry without rashes, lesions, cyanosis, clubbing or  ecchymosis.  Neuro: Cranial nerves intact, reflexes flat thru-out. Normal muscle tone, no cerebellar symptoms. Sensation intact to touch and Monofilament, but sl decreased to  vibratory  to the toes bilaterally Pysch: Alert and oriented X 3 with normal affect, insight and judgment appropriate.   Assessment and Plan  1. Annual Preventative/Screening Exam   2. Essential  hypertension  - EKG 12-Lead - [place], RETROPERITNL ABD,  LTD - Urinalysis, Routine w reflex microscopic - Microalbumin / creatinine urine ratio - CBC with Differential/Platelet - COMPLETE METABOLIC PANEL WITH GFR - Magnesium - TSH  3. Hyperlipidemia, mixed  - EKG 12-Lead - [place], RETROPERITNL ABD,  LTD - Lipid panel - TSH  4. Type 2 diabetes mellitus with stage 3 chronic kidney disease, without long-term current use of insulin (HCC)  - EKG 12-Lead - [place], RETROPERITNL ABD,  LTD - Hemoglobin A1c - Insulin, random  5. Vitamin D deficiency  - VITAMIN D 25 Hydroxyl  6. Idiopathic gout  - Uric acid  7. Testosterone deficiency  - Testosterone  8. BPH with obstruction/lower urinary tract symptoms  - CBC with Differential/Platelet - COMPLETE METABOLIC PANEL WITH GFR - Magnesium - Lipid panel - TSH - Hemoglobin A1c - Insulin, random - VITAMIN D 25 Hydroxyl  9. 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 (HCC)  - Urinalysis, Routine w reflex microscopic - Microalbumin / creatinine urine ratio - COMPLETE METABOLIC PANEL WITH GFR  10. Elevated PSA  - PSA  11. Diabetic neuropathy, painful (HCC)  - gabapentin (NEURONTIN) 100 MG capsule; Take 1 to 2 capsules 2 x /day at Suppertime & Bedtime for NeuropathyPain  Dispense: 360 capsule; Refill: 0  12. Screening for colorectal cancer  - POC Hemoccult Bld/Stl  13. Screening for ischemic heart disease  - EKG 12-Lead  14. FHx: heart disease   15. Screening for AAA (aortic abdominal aneurysm)  - [place], RETROPERITNL ABD,  LTD  16. Screening for rectal cancer   17. Medication management  - Uric acid  18. Need for prophylactic vaccination against Streptococcus pneumoniae (pneumococcus)  - Pneumococcal polysaccharide vaccine 23-valent greater than or equal to 2yo subcutaneous/IM           Patient was counseled in prudent diet, weight control to achieve/maintain BMI less than 25, BP monitoring, regular exercise and  medications as discussed.  Discussed med effects and SE's. Routine screening labs and tests as requested with regular follow-up as recommended. Over 40 minutes of exam, counseling, chart review and high complex critical decision making was performed

## 2018-05-27 ENCOUNTER — Encounter: Payer: Self-pay | Admitting: Internal Medicine

## 2018-05-29 ENCOUNTER — Ambulatory Visit (INDEPENDENT_AMBULATORY_CARE_PROVIDER_SITE_OTHER): Payer: PPO | Admitting: Internal Medicine

## 2018-05-29 VITALS — BP 136/82 | HR 60 | Temp 97.7°F | Resp 16 | Ht 69.25 in | Wt 172.4 lb

## 2018-05-29 DIAGNOSIS — Z136 Encounter for screening for cardiovascular disorders: Secondary | ICD-10-CM

## 2018-05-29 DIAGNOSIS — N183 Chronic kidney disease, stage 3 unspecified: Secondary | ICD-10-CM

## 2018-05-29 DIAGNOSIS — Z1212 Encounter for screening for malignant neoplasm of rectum: Secondary | ICD-10-CM

## 2018-05-29 DIAGNOSIS — E782 Mixed hyperlipidemia: Secondary | ICD-10-CM | POA: Diagnosis not present

## 2018-05-29 DIAGNOSIS — E1122 Type 2 diabetes mellitus with diabetic chronic kidney disease: Secondary | ICD-10-CM

## 2018-05-29 DIAGNOSIS — E349 Endocrine disorder, unspecified: Secondary | ICD-10-CM

## 2018-05-29 DIAGNOSIS — Z0000 Encounter for general adult medical examination without abnormal findings: Secondary | ICD-10-CM

## 2018-05-29 DIAGNOSIS — R972 Elevated prostate specific antigen [PSA]: Secondary | ICD-10-CM | POA: Diagnosis not present

## 2018-05-29 DIAGNOSIS — N138 Other obstructive and reflux uropathy: Secondary | ICD-10-CM

## 2018-05-29 DIAGNOSIS — E1140 Type 2 diabetes mellitus with diabetic neuropathy, unspecified: Secondary | ICD-10-CM

## 2018-05-29 DIAGNOSIS — Z23 Encounter for immunization: Secondary | ICD-10-CM

## 2018-05-29 DIAGNOSIS — Z8249 Family history of ischemic heart disease and other diseases of the circulatory system: Secondary | ICD-10-CM

## 2018-05-29 DIAGNOSIS — Z1211 Encounter for screening for malignant neoplasm of colon: Secondary | ICD-10-CM

## 2018-05-29 DIAGNOSIS — I10 Essential (primary) hypertension: Secondary | ICD-10-CM | POA: Diagnosis not present

## 2018-05-29 DIAGNOSIS — M1000 Idiopathic gout, unspecified site: Secondary | ICD-10-CM

## 2018-05-29 DIAGNOSIS — E559 Vitamin D deficiency, unspecified: Secondary | ICD-10-CM

## 2018-05-29 DIAGNOSIS — Z0001 Encounter for general adult medical examination with abnormal findings: Secondary | ICD-10-CM

## 2018-05-29 DIAGNOSIS — N401 Enlarged prostate with lower urinary tract symptoms: Secondary | ICD-10-CM

## 2018-05-29 DIAGNOSIS — Z79899 Other long term (current) drug therapy: Secondary | ICD-10-CM

## 2018-05-29 MED ORDER — GABAPENTIN 100 MG PO CAPS: ORAL_CAPSULE | ORAL | 0 refills | Status: DC

## 2018-05-30 ENCOUNTER — Other Ambulatory Visit: Payer: Self-pay | Admitting: *Deleted

## 2018-05-30 LAB — COMPLETE METABOLIC PANEL WITH GFR
AG Ratio: 1.9 (calc) (ref 1.0–2.5)
ALT: 21 U/L (ref 9–46)
AST: 21 U/L (ref 10–35)
Albumin: 4.7 g/dL (ref 3.6–5.1)
Alkaline phosphatase (APISO): 68 U/L (ref 40–115)
BUN / CREAT RATIO: 17 (calc) (ref 6–22)
BUN: 24 mg/dL (ref 7–25)
CO2: 25 mmol/L (ref 20–32)
Calcium: 10 mg/dL (ref 8.6–10.3)
Chloride: 104 mmol/L (ref 98–110)
Creat: 1.39 mg/dL — ABNORMAL HIGH (ref 0.70–1.25)
GFR, Est African American: 60 mL/min/{1.73_m2} (ref >=60)
GFR, Est Non African American: 52 mL/min/{1.73_m2} — ABNORMAL LOW (ref >=60)
Globulin: 2.5 g/dL (calc) (ref 1.9–3.7)
Glucose, Bld: 117 mg/dL — ABNORMAL HIGH (ref 65–99)
Potassium: 4.6 mmol/L (ref 3.5–5.3)
Sodium: 140 mmol/L (ref 135–146)
Total Bilirubin: 0.6 mg/dL (ref 0.2–1.2)
Total Protein: 7.2 g/dL (ref 6.1–8.1)

## 2018-05-30 LAB — CBC WITH DIFFERENTIAL/PLATELET
Basophils Absolute: 67 {cells}/uL (ref 0–200)
Basophils Relative: 0.9 %
Eosinophils Absolute: 289 {cells}/uL (ref 15–500)
Eosinophils Relative: 3.9 %
HCT: 45.2 % (ref 38.5–50.0)
Hemoglobin: 15.3 g/dL (ref 13.2–17.1)
Lymphs Abs: 1598 {cells}/uL (ref 850–3900)
MCH: 28.8 pg (ref 27.0–33.0)
MCHC: 33.8 g/dL (ref 32.0–36.0)
MCV: 85 fL (ref 80.0–100.0)
MPV: 9.7 fL (ref 7.5–12.5)
Monocytes Relative: 6.6 %
Neutro Abs: 4958 {cells}/uL (ref 1500–7800)
Neutrophils Relative %: 67 %
Platelets: 294 10*3/uL (ref 140–400)
RBC: 5.32 Million/uL (ref 4.20–5.80)
RDW: 12.3 % (ref 11.0–15.0)
Total Lymphocyte: 21.6 %
WBC mixed population: 488 {cells}/uL (ref 200–950)
WBC: 7.4 10*3/uL (ref 3.8–10.8)

## 2018-05-30 LAB — URINALYSIS, ROUTINE W REFLEX MICROSCOPIC
Bilirubin Urine: NEGATIVE
Glucose, UA: NEGATIVE
Hgb urine dipstick: NEGATIVE
Ketones, ur: NEGATIVE
Leukocytes, UA: NEGATIVE
Nitrite: NEGATIVE
Protein, ur: NEGATIVE
Specific Gravity, Urine: 1.018 (ref 1.001–1.03)
pH: 5.5 (ref 5.0–8.0)

## 2018-05-30 LAB — LIPID PANEL
Cholesterol: 184 mg/dL (ref <200)
HDL: 37 mg/dL — ABNORMAL LOW (ref >40)
LDL CHOLESTEROL (CALC): 119 mg/dL — AB
Non-HDL Cholesterol (Calc): 147 mg/dL (calc) — ABNORMAL HIGH (ref <130)
Total CHOL/HDL Ratio: 5 (calc) — ABNORMAL HIGH (ref <5.0)
Triglycerides: 162 mg/dL — ABNORMAL HIGH (ref <150)

## 2018-05-30 LAB — MICROALBUMIN / CREATININE URINE RATIO
Creatinine, Urine: 102 mg/dL (ref 20–320)
Microalb Creat Ratio: 5 ug/mg{creat}
Microalb, Ur: 0.5 mg/dL

## 2018-05-30 LAB — HEMOGLOBIN A1C
Hgb A1c MFr Bld: 6 % of total Hgb — ABNORMAL HIGH (ref <5.7)
Mean Plasma Glucose: 126 (calc)
eAG (mmol/L): 7 (calc)

## 2018-05-30 LAB — TSH: TSH: 1.91 mIU/L (ref 0.40–4.50)

## 2018-05-30 LAB — TESTOSTERONE: Testosterone: 440 ng/dL (ref 250–827)

## 2018-05-30 LAB — MAGNESIUM: Magnesium: 1.9 mg/dL (ref 1.5–2.5)

## 2018-05-30 LAB — PSA: PSA: 2.2 ng/mL (ref <=4.0)

## 2018-05-30 LAB — VITAMIN D 25 HYDROXY (VIT D DEFICIENCY, FRACTURES): Vit D, 25-Hydroxy: 56 ng/mL (ref 30–100)

## 2018-05-30 LAB — INSULIN, RANDOM: Insulin: 6 u[IU]/mL (ref 2.0–19.6)

## 2018-05-30 LAB — URIC ACID: Uric Acid, Serum: 6 mg/dL (ref 4.0–8.0)

## 2018-05-30 MED ORDER — FREESTYLE SYSTEM KIT: PACK | 0 refills | Status: DC

## 2018-05-30 MED ORDER — FREESTYLE LANCETS MISC: 5 refills | Status: DC

## 2018-05-30 MED ORDER — GLUCOSE BLOOD VI STRP: ORAL_STRIP | 5 refills | Status: DC

## 2018-05-30 NOTE — Progress Notes (Signed)
-   Kidney functions look better - back to your baseline - very important to drink at least 7 bottles (16 oz) of water or fluids /day   - Chol- 184 - Excellent, but the bad or Dangerous LDL Chol - 119 - too high - ideal or goal is less than 70 ! , so.....................  - Recommend STRICTER  low cholesterol diet   - Cholesterol only comes from animal sources - ie. meat, dairy, eggs   - Eat all the vegetables you want.  - Avoid meat, especially red meat - Beef AND Pork .  - Avoid cheese & dairy - milk & ice cream.     - Cheese is the most concentrated form of trans-fats which is the worst thing to clog up our arteries.   - Veggie cheese is OK which can be found in the fresh produce section at [employer] or [employer] or [employer]  - A1c - much better - down from 7.0% to now 6.0% - Great   - Vit D - 56 - low - ideal or goal is betw 70-100, so..................  - Increase Vit D 5,000 unit caps up to 2 caps = 10,000 units /daily   -  Testosterone back in Normal range   - Uric acid gout test OK   - All Else - CBC - Kidneys - Electrolytes -  Liver - Magnesium & Thyroid  - all  Normal / OK

## 2018-06-03 ENCOUNTER — Encounter: Payer: Self-pay | Admitting: Internal Medicine

## 2018-07-10 ENCOUNTER — Other Ambulatory Visit: Payer: Self-pay | Admitting: Internal Medicine

## 2018-09-04 NOTE — Progress Notes (Deleted)
MEDICARE ANNUAL WELLNESS VISIT AND FOLLOW UP Assessment:   Diagnoses and all orders for this visit:  Encounter for Medicare annual wellness exam  Essential hypertension At goal; continue medications Monitor blood pressure at home; call if consistently over 130/80 Continue DASH diet.   Reminder to go to the ER if any CP, SOB, nausea, dizziness, severe HA, changes vision/speech, left arm numbness and tingling and jaw pain.  Type 2 diabetes mellitus with stage 3 chronic kidney disease, without long-term current use of insulin (HCC) Type 2 Diabetes Mellitus - poorly controlled Education: Reviewed 'ABCs' of diabetes management (respective goals in parentheses):  A1C (<7), blood pressure (<130/80), and cholesterol (LDL <70) Eye Exam yearly and Dental Exam every 6 months- he states he will schedule eye exam today Just had foot exam at CPE; defer Dietary recommendations Physical Activity recommendations --     BASIC METABOLIC PANEL WITH GFR -     Hemoglobin A1c  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 (HCC) Increase fluids, avoid NSAIDS, monitor sugars, will monitor -     BASIC METABOLIC PANEL WITH GFR  Mixed hyperlipidemia Fairly controlled on zetia; has been intolerant of several statins Continue low cholesterol diet and exercise.  -     Lipid panel -     TSH  Testosterone deficiency Has opted against supplementation Recommended weight loss, zinc 50 mg daily supplement, high-intensity interval exercises  Idiopathic gout, unspecified chronicity, unspecified site Continue allopurinol Diet discussed Check uric acid as needed - at goal at recent check  Vitamin D deficiency Below goal at last check; he has not  increased dose; goal is 70-100 He will increase dose this visit -  Defer vitamin D check until next visit  Medication management -     CBC with Differential/Platelet -     BASIC METABOLIC PANEL WITH GFR -     Hepatic function panel  Overweight (BMI 25.0-29.9) Long  discussion about weight loss, diet, and exercise Recommended diet heavy in fruits and veggies and low in animal meats, cheeses, and dairy products, appropriate calorie intake Patient will work on getting out and walking intentionally Discussed appropriate weight for height and initial goal (182 lb) Follow up at next visit  Over 30 minutes of exam, counseling, chart review, and critical decision making was performed  Future Appointments  Date Time Provider [place]  09/05/2018  9:30 AM Liane Comber, NP [facility] None  12/12/2018 10:30 AM Unk Pinto, MD [facility] None  06/17/2019 10:00 AM Unk Pinto, MD [facility] None     Plan:   During the course of the visit the patient was educated and counseled about appropriate screening and preventive services including:    Pneumococcal vaccine   Influenza vaccine  Prevnar 13  Td vaccine  Screening electrocardiogram  Colorectal cancer screening  Diabetes screening  Glaucoma screening  Nutrition counseling    Subjective:  [name] is a 68 y.o. male who presents for Medicare Annual Wellness Visit and 3 month follow up for HTN, hyperlipidemia, T2DM with CKD 3, and vitamin D Def.   BMI is There is no height or weight on file to calculate BMI., he has been working on diet- avoiding sweets and soda. Modest intake of white carbs. Pushes fruit and vegetable intake.  Wt Readings from Last 3 Encounters:  05/29/18 172 lb 6.4 oz (78.2 kg)  02/09/18 175 lb (79.4 kg)  11/09/17 181 lb 3.2 oz (82.2 kg)   His blood pressure has been controlled at home, today  their BP is   He does not workout - but walks extensively at home and fishes on his days off.  He denies chest pain, shortness of breath, dizziness.   He is on cholesterol medication (zetia 10 mg daily - intolerant of several statins) and denies myalgias. His cholesterol is not at goal. The cholesterol last visit was:   Lab Results  Component Value Date   CHOL  184 05/29/2018   HDL 37 (L) 05/29/2018   LDLCALC 119 (H) 05/29/2018   TRIG 162 (H) 05/29/2018   CHOLHDL 5.0 (H) 05/29/2018   He has been working on diet and exercise for T2 diabetes, and denies increased appetite, nausea, paresthesia of the feet, polydipsia, polyuria, visual disturbances, vomiting and weight loss. He has increased his metformin dose since last visit. Last A1C in the office was:  Lab Results  Component Value Date   HGBA1C 6.0 (H) 05/29/2018   Last GFR Lab Results  Component Value Date   GFRNONAA 52 (L) 05/29/2018    Patient is on Vitamin D supplement but remained below goal at most recent check:    Lab Results  Component Value Date   VD25OH 56 05/29/2018     Patient is on allopurinol for gout and does not report a recent flare.  Lab Results  Component Value Date   LABURIC 6.0 05/29/2018     Medication Review: Current Outpatient Medications on File Prior to Visit  Medication Sig Dispense Refill  . allopurinol (ZYLOPRIM) 300 MG tablet TAKE ONE TABLET BY MOUTH DAILY 90 tablet 3  . BABY ASPIRIN PO Take 81 mg by mouth daily.    . bisoprolol-hydrochlorothiazide (ZIAC) 5-6.25 MG tablet TAKE ONE TABLET BY MOUTH DAILY FOR BLOOD PRESSURE 90 tablet 0  . Cholecalciferol (VITAMIN D PO) Take 5,000 Int'l Units by mouth daily.    . colchicine 0.6 MG tablet Take 1 to 2 tablets daily for gout attack and PRN. 60 tablet 1  . enalapril (VASOTEC) 20 MG tablet TAKE ONE TABLET BY MOUTH DAILY 90 tablet 3  . ezetimibe (ZETIA) 10 MG tablet TAKE ONE TABLET BY MOUTH DAILY FOR CHOLESTEROL 90 tablet 0  . gabapentin (NEURONTIN) 100 MG capsule Take 1 to 2 capsules 2 x /day at Suppertime & Bedtime for NeuropathyPain 360 capsule 0  . glucose blood (FREESTYLE TEST STRIPS) test strip CHECK BLOOD SUGAR 1 TIME DAILY-DX-E11.22 100 each 5  . glucose monitoring kit (FREESTYLE) monitoring kit Check blood sugar 1 time daily-DX-E11.22. 1 each 0  . Lancets (FREESTYLE) lancets CHECK BLOOD SUGAR 1 TIME  DAILY-DX-E11.22 100 each 5  . metFORMIN (GLUCOPHAGE XR) 500 MG 24 hr tablet Take 1 tablet with breakfast, take 1 tablet with lunch and take 2 tablets with dinner. 360 tablet 1   No current facility-administered medications on file prior to visit.     Allergies: Allergies  Allergen Reactions  . Crestor [Rosuvastatin]   . Welchol [Colesevelam Hcl]   . Zoloft [Sertraline Hcl]   . Penicillins Rash    Current Problems (verified) has Hyperlipidemia associated with type 2 diabetes mellitus ([place]); Hypertension; Type 2 diabetes mellitus with stage 3 chronic kidney disease ([place]); Testosterone deficiency; Gout; Vitamin D deficiency; Medication management; 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 ([place]); and Overweight (BMI 25.0-29.9) on their problem list.  Screening Tests Immunization History  Administered Date(s) Administered  . PPD Test 02/07/2014, 02/16/2015  . Pneumococcal Conjugate-13 04/12/2017  . Pneumococcal Polysaccharide-23 11/01/2010, 05/29/2018  . Td 12/25/2012  . Zoster 12/25/2012  Preventative care: Last colonoscopy: 2002? cologuard ordered last year but not completed- reordered  Prior vaccinations: TD or Tdap: 2014   Influenza: Refuses -  Pneumococcal: 2012 Prevnar13: 2018 Shingles/Zostavax: 2014  Names of Other Physician/Practitioners you currently use: 1. [facility] here for primary care 2. Dr. Herbert Deaner, eye doctor, last visit 07/21/2017 DUE *** 3. . Dr. Donnal Debar, dentist, last visit 2018  Patient Care Team: Unk Pinto, MD as PCP - General (Internal Medicine)  Surgical: He  has a past surgical history that includes Wrist surgery (Right, 1998); Prostate biopsy (2010); and Cyst removal hand. Family His family history includes Heart disease in his father; Heart disease (age of onset: 29) in his mother; Heart failure in his father; Kidney disease in his father. Social history  He reports that he has never smoked. He has  never used smokeless tobacco. He reports that he does not drink alcohol or use drugs.  MEDICARE WELLNESS OBJECTIVES: Physical activity:   Cardiac risk factors:   Depression/mood screen:   Depression screen [facility] 2/9 06/03/2018  Decreased Interest 0  Down, Depressed, Hopeless 0  PHQ - 2 Score 0    ADLs:  In your present state of health, do you have any difficulty performing the following activities: 06/03/2018 11/12/2017  Hearing? N N  Vision? N N  Difficulty concentrating or making decisions? N N  Walking or climbing stairs? N N  Dressing or bathing? N N  Doing errands, shopping? N N  Some recent data might be hidden     Cognitive Testing  Alert? Yes  Normal Appearance?Yes  Oriented to person? Yes  Place? Yes   Time? Yes  Recall of three objects?  Yes  Can perform simple calculations? Yes  Displays appropriate judgment?Yes  Can read the correct time from a watch face?Yes  EOL planning:    Review of Systems  Constitutional: Negative for malaise/fatigue and weight loss.  HENT: Negative for hearing loss and tinnitus.   Eyes: Negative for blurred vision and double vision.  Respiratory: Negative for cough, shortness of breath and wheezing.   Cardiovascular: Negative for chest pain, palpitations, orthopnea, claudication and leg swelling.  Gastrointestinal: Negative for abdominal pain, blood in stool, constipation, diarrhea, heartburn, melena, nausea and vomiting.  Genitourinary: Negative.   Musculoskeletal: Negative for joint pain and myalgias.  Skin: Negative for rash.  Neurological: Negative for dizziness, tingling, sensory change, weakness and headaches.  Endo/Heme/Allergies: Negative for polydipsia.  Psychiatric/Behavioral: Negative.   All other systems reviewed and are negative.    Objective:   There were no vitals filed for this visit. There is no height or weight on file to calculate BMI.  General appearance: alert, no distress, WD/WN, male HEENT: normocephalic,  sclerae anicteric, TMs pearly, nares patent, no discharge or erythema, pharynx normal Oral cavity: MMM, no lesions Neck: supple, no lymphadenopathy, no thyromegaly, no masses Heart: RRR, normal S1, S2, no murmurs Lungs: CTA bilaterally, no wheezes, rhonchi, or rales Abdomen: +bs, soft, non tender, non distended, no masses, no hepatomegaly, no splenomegaly Musculoskeletal: nontender, no swelling, no obvious deformity Extremities: no edema, no cyanosis, no clubbing Pulses: 2+ symmetric, upper and lower extremities, normal cap refill Neurological: alert, oriented x 3, CN2-12 intact, strength normal upper extremities and lower extremities, sensation normal throughout, DTRs 2+ throughout, no cerebellar signs, gait normal Psychiatric: normal affect, behavior normal, pleasant   Medicare Attestation I have personally reviewed: The patient's medical and social history Their use of alcohol, tobacco or illicit drugs Their current  medications and supplements The patient's functional ability including ADLs,fall risks, home safety risks, cognitive, and hearing and visual impairment Diet and physical activities Evidence for depression or mood disorders  The patient's weight, height, BMI, and visual acuity have been recorded in the chart.  I have made referrals, counseling, and provided education to the patient based on review of the above and I have provided the patient with a written personalized care plan for preventive services.     Izora Ribas, NP   09/04/2018

## 2018-09-05 ENCOUNTER — Ambulatory Visit: Payer: Self-pay | Admitting: Adult Health

## 2018-09-14 ENCOUNTER — Telehealth: Payer: Self-pay | Admitting: Physician Assistant

## 2018-09-18 NOTE — Telephone Encounter (Signed)
error 

## 2018-10-10 ENCOUNTER — Other Ambulatory Visit: Payer: Self-pay | Admitting: Internal Medicine

## 2018-10-10 DIAGNOSIS — E1140 Type 2 diabetes mellitus with diabetic neuropathy, unspecified: Secondary | ICD-10-CM

## 2018-11-29 ENCOUNTER — Other Ambulatory Visit: Payer: Self-pay | Admitting: Internal Medicine

## 2018-12-12 ENCOUNTER — Ambulatory Visit: Payer: Self-pay | Admitting: Internal Medicine

## 2018-12-12 ENCOUNTER — Encounter: Payer: Self-pay | Admitting: Internal Medicine

## 2018-12-12 NOTE — Progress Notes (Signed)
       N  O       S  H  O  W                                                                                                                                                                                                                                                                                   This very nice 68 y.o.  WWM presents for 6 month follow up with HTN, HLD, T2_NIDDM  and Vitamin D Deficiency. Patient has Gout controlled on Allopurinol.       Patient is treated for HTN (2000)   & BP has been controlled  On Enalapril at home. Today's  . Patient has had no complaints of any cardiac type chest pain, palpitations, dyspnea / orthopnea / PND, dizziness, claudication, or dependent edema.      Hyperlipidemia is controlled with diet & Zetia. Patient denies myalgias or other med SE's. Last Lipids were  Lab Results  Component Value Date   CHOL 184 05/29/2018   HDL 37 (L) 05/29/2018   LDLCALC 119 (H) 05/29/2018   TRIG 162 (H) 05/29/2018   CHOLHDL 5.0 (H) 05/29/2018       Also, the patient has history of T2_NIDDM (2002)  w/CKD3 (GFR 52) on Metformin  and has had no symptoms of reactive hypoglycemia, diabetic polys or visual blurring,, but does c/o painful burning dysthesias paresthesias.  Last A1c was near goal:   Lab Results  Component Value Date   HGBA1C 6.0 (H) 05/29/2018       Further, the patient also has history of Vitamin D ("14" / 2010)  and supplements vitamin D without any suspected side-effects. Last vitamin D was near goal: Lab Results  Component Value Date   VD25OH 56 05/29/2018

## 2018-12-14 NOTE — Progress Notes (Signed)
MEDICARE ANNUAL WELLNESS VISIT AND FOLLOW UP Assessment:    Encounter for Medicare annual wellness exam  Essential hypertension At goal; continue medications Monitor blood pressure at home; call if consistently over 130/80 Continue DASH diet.   Reminder to go to the ER if any CP, SOB, nausea, dizziness, severe HA, changes vision/speech, left arm numbness and tingling and jaw pain.  Type 2 diabetes mellitus with stage 3 chronic kidney disease, without long-term current use of insulin (HCC) Type 2 Diabetes Mellitus Education: Reviewed 'ABCs' of diabetes management (respective goals in parentheses):  A1C (<7), blood pressure (<130/80), and cholesterol (LDL <70) Eye Exam yearly and Dental Exam every 6 months- he states he will schedule eye exam- will get from Dr. Delman Cheadle Dietary recommendations Physical Activity recommendations --     BASIC METABOLIC PANEL WITH GFR -     Hemoglobin A1c  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 (HCC) Increase fluids, avoid NSAIDS, monitor sugars, will monitor -     BASIC METABOLIC PANEL WITH GFR  Mixed hyperlipidemia Fairly controlled on zetia; has been intolerant of several statins Continue low cholesterol diet and exercise.  -     Lipid panel -     TSH  Testosterone deficiency Has opted against supplementation Recommended weight loss, zinc 50 mg daily supplement, high-intensity interval exercises  Idiopathic gout, unspecified chronicity, unspecified site Continue allopurinol Diet discussed Check uric acid as needed - at goal at recent check  Vitamin D deficiency Below goal at last check; he has not  increased dose; goal is 70-100 He will increase dose this visit -  Defer vitamin D check until next visit  Medication management -     CBC with Differential/Platelet -     BASIC METABOLIC PANEL WITH GFR -     Hepatic function panel  Diabetic polyneuropathy associated with type 2 diabetes mellitus ([place]) Continue gabapentin, will send in  '300mg'$  for him when he runs out of '100mg'$  Continue weight loss  Statin intolerance Continue zetia and weight loss  Over 30 minutes of exam, counseling, chart review, and critical decision making was performed  Future Appointments  Date Time Provider [place]  06/17/2019 10:00 AM Unk Pinto, MD [facility] None     Plan:   During the course of the visit the patient was educated and counseled about appropriate screening and preventive services including:    Pneumococcal vaccine   Influenza vaccine  Prevnar 13  Td vaccine  Screening electrocardiogram  Colorectal cancer screening  Diabetes screening  Glaucoma screening  Nutrition counseling    Subjective:  [name] is a 68 y.o. male who presents for Medicare Annual Wellness Visit and 3 month follow up for HTN, hyperlipidemia, T2DM with CKD 3, and vitamin D Def.   Had recent positive cologuard, had colonoscopy 10/2017, recall 3 years.  Right handed, had left shoulder surgery with Dr. Rhona Raider, states ROM is better but still with tingling in neck. Has not had Xray of his neck.   BMI is Body mass index is 23.63 kg/m., he has been working on diet- avoiding sweets and soda. Increased water, continues to exercise.  Wt Readings from Last 3 Encounters:  12/17/18 160 lb (72.6 kg)  05/29/18 172 lb 6.4 oz (78.2 kg)  02/09/18 175 lb (79.4 kg)   His blood pressure has been controlled at home, today their BP is BP: 126/84. BP is better with 1/2 of enalapril.  He does not workout - but walks extensively at home and fishes  on his days off.  He denies chest pain, shortness of breath, dizziness.   He is on cholesterol medication (zetia 10 mg daily - intolerant of several statins) and denies myalgias. His cholesterol is not at goal. The cholesterol last visit was:   Lab Results  Component Value Date   CHOL 184 05/29/2018   HDL 37 (L) 05/29/2018   LDLCALC 119 (H) 05/29/2018   TRIG 162 (H) 05/29/2018   CHOLHDL 5.0  (H) 05/29/2018   He has been working on diet and exercise for T2 diabetes, he is on 4 a day of metformin, he is on gabapentin for neuropathy, and denies increased appetite, nausea, paresthesia of the feet, polydipsia, polyuria, visual disturbances, vomiting and weight loss.Last A1C in the office was:  Lab Results  Component Value Date   HGBA1C 6.0 (H) 05/29/2018   Last GFR Lab Results  Component Value Date   GFRNONAA 52 (L) 05/29/2018    Patient is on Vitamin D supplement but remained below goal at most recent check:    Lab Results  Component Value Date   VD25OH 56 05/29/2018     Patient is on allopurinol for gout and does not report a recent flare.  Lab Results  Component Value Date   LABURIC 6.0 05/29/2018     Medication Review: Current Outpatient Medications on File Prior to Visit  Medication Sig Dispense Refill  . allopurinol (ZYLOPRIM) 300 MG tablet TAKE ONE TABLET BY MOUTH DAILY 90 tablet 3  . BABY ASPIRIN PO Take 81 mg by mouth daily.    . bisoprolol-hydrochlorothiazide (ZIAC) 5-6.25 MG tablet Take 1 tablet Daily for BP 90 tablet 1  . Cholecalciferol (VITAMIN D PO) Take 5,000 Int'l Units by mouth daily.    . enalapril (VASOTEC) 20 MG tablet TAKE ONE TABLET BY MOUTH DAILY 90 tablet 3  . ezetimibe (ZETIA) 10 MG tablet TAKE ONE TABLET BY MOUTH DAILY FOR CHOLESTEROL 90 tablet 0  . gabapentin (NEURONTIN) 100 MG capsule Take 1 to 2 capsules 2 x /day at Suppertime & Bedtime for Neuropathy Pain 360 capsule 1  . glucose blood (FREESTYLE TEST STRIPS) test strip CHECK BLOOD SUGAR 1 TIME DAILY-DX-E11.22 100 each 5  . glucose monitoring kit (FREESTYLE) monitoring kit Check blood sugar 1 time daily-DX-E11.22. 1 each 0  . Lancets (FREESTYLE) lancets CHECK BLOOD SUGAR 1 TIME DAILY-DX-E11.22 100 each 5  . metFORMIN (GLUCOPHAGE-XR) 500 MG 24 hr tablet Take 2 tablets  2 x /day with Meals for Diabetes 360 tablet 3  . colchicine 0.6 MG tablet Take 1 to 2 tablets daily for gout attack and PRN.  60 tablet 1   No current facility-administered medications on file prior to visit.     Allergies: Allergies  Allergen Reactions  . Crestor [Rosuvastatin]   . Welchol [Colesevelam Hcl]   . Zoloft [Sertraline Hcl]   . Penicillins Rash    Current Problems (verified) has Hyperlipidemia associated with type 2 diabetes mellitus ([place]); Hypertension; Type 2 diabetes mellitus with stage 3 chronic kidney disease ([place]); Testosterone deficiency; Gout; Vitamin D deficiency; Medication management; 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 ([place]); and Overweight (BMI 25.0-29.9) on their problem list.  Screening Tests Immunization History  Administered Date(s) Administered  . PPD Test 02/07/2014, 02/16/2015  . Pneumococcal Conjugate-13 04/12/2017  . Pneumococcal Polysaccharide-23 11/01/2010, 05/29/2018  . Td 12/25/2012  . Zoster 12/25/2012   Preventative care: Last colonoscopy: 10/2017, due 3 years + adenoma  cologuard + sent for colonoscopy   Prior vaccinations:  TD or Tdap: 2014   Influenza: Refuses -  Pneumococcal: 2012 Prevnar13: 2018 Shingles/Zostavax: 2014  Names of Other Physician/Practitioners you currently use: 1. [facility] here for primary care 2. Dr. Herbert Deaner, eye doctor, last visit 2019 3. Dr. Donnal Debar, dentist, last visit 2018  Patient Care Team: Unk Pinto, MD as PCP - General (Internal Medicine)  Surgical: He  has a past surgical history that includes Wrist surgery (Right, 1998); Prostate biopsy (2010); and Cyst removal hand. Family His family history includes Heart disease in his father; Heart disease (age of onset: 66) in his mother; Heart failure in his father; Kidney disease in his father. Social history  He reports that he has never smoked. He has never used smokeless tobacco. He reports that he does not drink alcohol or use drugs.  MEDICARE WELLNESS OBJECTIVES: Physical activity: Current Exercise Habits: Home exercise  routine, Type of exercise: walking Cardiac risk factors: Cardiac Risk Factors include: advanced age (>80mn, >>5women);dyslipidemia;male gender;hypertension;sedentary lifestyle;family history of premature cardiovascular disease;diabetes mellitus Depression/mood screen:   [facility] 12/17[facility]2018  Decreased Interest 0  Down, Depressed, Hopeless 0  PHQ - 2 Score 0    ADLs:  In your present state of health, do you have any difficulty performing the following activities: 12/17/2018 06/03/2018  Hearing? N N  Vision? N N  Difficulty concentrating or making decisions? N N  Walking or climbing stairs? N N  Dressing or bathing? N N  Doing errands, shopping? N N  Some recent data might be hidden     Cognitive Testing  Alert? Yes  Normal Appearance?Yes  Oriented to person? Yes  Place? Yes   Time? Yes  Recall of three objects?  Yes  Can perform simple calculations? Yes  Displays appropriate judgment?Yes  Can read the correct time from a watch face?Yes  EOL planning: Does Patient Have a Medical Advance Directive?: No Would patient like information on creating a medical advance directive?: No - Patient declined  Review of Systems  Constitutional: Negative for malaise/fatigue and weight loss.  HENT: Negative for hearing loss and tinnitus.   Eyes: Negative for blurred vision and double vision.  Respiratory: Negative for cough, shortness of breath and wheezing.   Cardiovascular: Negative for chest pain, palpitations, orthopnea, claudication and leg swelling.  Gastrointestinal: Negative for abdominal pain, blood in stool, constipation, diarrhea, heartburn, melena, nausea and vomiting.  Genitourinary: Negative.   Musculoskeletal: Negative for joint pain and myalgias.  Skin: Negative for rash.  Neurological: Negative for dizziness, tingling, sensory change, weakness and headaches.  Endo/Heme/Allergies: Negative for polydipsia.  Psychiatric/Behavioral: Negative.   All other systems  reviewed and are negative.    Objective:   Today's Vitals   12/17/18 0956  BP: 126/84  Pulse: 67  Temp: 97.7 F (36.5 C)  SpO2: 98%  Weight: 160 lb (72.6 kg)  Height: '5\' 9"'$  (1.753 m)   Body mass index is 23.63 kg/m.  General appearance: alert, no distress, WD/WN, male HEENT: normocephalic, sclerae anicteric, TMs pearly, nares patent, no discharge or erythema, pharynx normal Oral cavity: MMM, no lesions Neck: supple, no lymphadenopathy, no thyromegaly, no masses Heart: RRR, normal S1, S2, no murmurs Lungs: CTA bilaterally, no wheezes, rhonchi, or rales Abdomen: +bs, soft, non tender, non distended, no masses, no hepatomegaly, no splenomegaly Musculoskeletal: nontender, no swelling, no obvious deformity Extremities: no edema, no cyanosis, no clubbing Pulses: 2+ symmetric, upper and lower extremities, normal cap refill Neurological: alert, oriented x 3, CN2-12 intact, strength normal  upper extremities and lower extremities, sensation normal throughout, DTRs 2+ throughout, no cerebellar signs, gait normal Psychiatric: normal affect, behavior normal, pleasant   Medicare Attestation I have personally reviewed: The patient's medical and social history Their use of alcohol, tobacco or illicit drugs Their current medications and supplements The patient's functional ability including ADLs,fall risks, home safety risks, cognitive, and hearing and visual impairment Diet and physical activities Evidence for depression or mood disorders  The patient's weight, height, BMI, and visual acuity have been recorded in the chart.  I have made referrals, counseling, and provided education to the patient based on review of the above and I have provided the patient with a written personalized care plan for preventive services.     Vicie Mutters, PA-C   12/17/2018

## 2018-12-17 ENCOUNTER — Ambulatory Visit (INDEPENDENT_AMBULATORY_CARE_PROVIDER_SITE_OTHER): Payer: PPO | Admitting: Physician Assistant

## 2018-12-17 ENCOUNTER — Other Ambulatory Visit: Payer: Self-pay

## 2018-12-17 ENCOUNTER — Encounter: Payer: Self-pay | Admitting: Physician Assistant

## 2018-12-17 VITALS — BP 126/84 | HR 67 | Temp 97.7°F | Ht 69.0 in | Wt 160.0 lb

## 2018-12-17 DIAGNOSIS — Z79899 Other long term (current) drug therapy: Secondary | ICD-10-CM | POA: Diagnosis not present

## 2018-12-17 DIAGNOSIS — Z0001 Encounter for general adult medical examination with abnormal findings: Secondary | ICD-10-CM

## 2018-12-17 DIAGNOSIS — E1169 Type 2 diabetes mellitus with other specified complication: Secondary | ICD-10-CM | POA: Diagnosis not present

## 2018-12-17 DIAGNOSIS — N183 Chronic kidney disease, stage 3 (moderate): Secondary | ICD-10-CM | POA: Diagnosis not present

## 2018-12-17 DIAGNOSIS — Z789 Other specified health status: Secondary | ICD-10-CM | POA: Diagnosis not present

## 2018-12-17 DIAGNOSIS — Z0000 Encounter for general adult medical examination without abnormal findings: Secondary | ICD-10-CM

## 2018-12-17 DIAGNOSIS — E1142 Type 2 diabetes mellitus with diabetic polyneuropathy: Secondary | ICD-10-CM | POA: Diagnosis not present

## 2018-12-17 DIAGNOSIS — M1000 Idiopathic gout, unspecified site: Secondary | ICD-10-CM | POA: Diagnosis not present

## 2018-12-17 DIAGNOSIS — I10 Essential (primary) hypertension: Secondary | ICD-10-CM | POA: Diagnosis not present

## 2018-12-17 DIAGNOSIS — E785 Hyperlipidemia, unspecified: Secondary | ICD-10-CM | POA: Diagnosis not present

## 2018-12-17 DIAGNOSIS — E349 Endocrine disorder, unspecified: Secondary | ICD-10-CM | POA: Diagnosis not present

## 2018-12-17 DIAGNOSIS — E559 Vitamin D deficiency, unspecified: Secondary | ICD-10-CM

## 2018-12-17 DIAGNOSIS — R6889 Other general symptoms and signs: Secondary | ICD-10-CM | POA: Diagnosis not present

## 2018-12-17 DIAGNOSIS — E1122 Type 2 diabetes mellitus with diabetic chronic kidney disease: Secondary | ICD-10-CM | POA: Diagnosis not present

## 2018-12-17 NOTE — Patient Instructions (Addendum)
May want to get neck xray or do physical therapy for neck  Depending on A1C we may but back your metformin again  Can take the gabapentin 300mg  at night.   It can make you sleepy so we suggest trying it at night first and please plan to not drive or do anything strenuous.  Also please do not take this medication with alcohol.   Start out 1 pill at night before bed, can increase to 2 pills at night before bed. Please call the office if you have any side effects.  Can kick in 1-3 hours so play with the timing.   Can take 3 pills a day total however you would like  Some examples: - 1 breakfast, lunch, bedtime. - 1 at breakfast, 2 at bed time  Common side effects are sleepiness, concentration problems, dizziness, swelling.  Do not stop abruptly unless you have a reaction to it.    Cervical Radiculopathy  Cervical radiculopathy happens when a nerve in the neck (a cervical nerve) is pinched or bruised. This condition can happen because of an injury to the cervical spine (vertebrae) in the neck, or as part of the normal aging process. Pressure on the cervical nerves can cause pain or numbness that travels from the neck all the way down into the arm and fingers. Usually, this condition gets better with rest. Treatment may be needed if the condition does not improve. What are the causes? This condition may be caused by:  A neck injury.  A bulging (herniated) disk.  Muscle spasms.  Muscle tightness in the neck because of overuse.  Arthritis.  Breakdown or degeneration in the bones and joints of the spine (spondylosis) due to aging.  Bone spurs that may develop near the cervical nerves. What are the signs or symptoms? Symptoms of this condition include:  Pain. The pain may travel from the neck to the arm and hand. The pain can be severe or irritating. It may be worse when you move your neck.  Numbness or tingling in your arm or hand.  Weakness in the affected arm and hand, in severe  cases. How is this diagnosed? This condition may be diagnosed based on your symptoms, your medical history, and a physical exam. You may also have tests, including:  X-rays.  A CT scan.  An MRI.  An electromyogram (EMG).  Nerve conduction tests. How is this treated? In many cases, treatment is not needed for this condition. With rest, the condition usually gets better over time. If treatment is needed, options may include:  Wearing a soft neck collar (cervical collar) for short periods of time, as told by your health care provider.  Doing physical therapy to strengthen your neck muscles.  Taking medicines, such as NSAIDs or oral corticosteroids.  Having spinal injections, in severe cases.  Having surgery. This may be needed if other treatments do not help. Different types of surgery may be done depending on the cause of this condition. Follow these instructions at home: If you have a cervical collar:  Wear it as told by your health care provider. Remove it only as told by your health care provider.  Ask your health care provider if you can remove the collar for cleaning and bathing. If you are allowed to remove the collar for cleaning or bathing: ? Follow instructions from your health care provider about how to remove the collar safely. ? Clean the collar by wiping it with mild soap and water and drying it completely. ?  Take out any removable pads in the collar every 1-2 days, and wash them by hand with soap and water. Let them air-dry completely before you put them back in the collar. ? Check your skin under the collar for irritation or sores. If you see any, tell your health care provider. Managing pain      Take over-the-counter and prescription medicines only as told by your health care provider.  If directed, put ice on the affected area. ? If you have a soft neck collar, remove it as told by your health care provider. ? Put ice in a plastic bag. ? Place a towel  between your skin and the bag. ? Leave the ice on for 20 minutes, 2-3 times a day.  If applying ice does not help, you can try using heat. Use the heat source that your health care provider recommends, such as a moist heat pack or a heating pad. ? Place a towel between your skin and the heat source. ? Leave the heat on for 20-30 minutes. ? Remove the heat if your skin turns bright red. This is especially important if you are unable to feel pain, heat, or cold. You may have a greater risk of getting burned.  Try a gentle neck and shoulder massage to help relieve symptoms. Activity  Rest as needed.  Return to your normal activities as told by your health care provider. Ask your health care provider what activities are safe for you.  Do stretching and strengthening exercises as told by your health care provider or physical therapist.  Do not lift anything that is heavier than 10 lb (4.5 kg) until your health care provider tells you that it is safe. General instructions  Use a flat pillow when you sleep.  Do not drive while wearing a cervical collar. If you do not have a cervical collar, ask your health care provider if it is safe to drive while your neck heals.  Ask your health care provider if the medicine prescribed to you requires you to avoid driving or using heavy machinery.  Do not use any products that contain nicotine or tobacco, such as cigarettes, e-cigarettes, and chewing tobacco. These can delay healing. If you need help quitting, ask your health care provider.  Keep all follow-up visits as told by your health care provider. This is important. Contact a health care provider if:  Your condition does not improve with treatment. Get help right away if:  Your pain gets much worse and cannot be controlled with medicines.  You have weakness or numbness in your hand, arm, face, or leg.  You have a high fever.  You have a stiff, rigid neck.  You lose control of your bowels  or your bladder (have incontinence).  You have trouble with walking, balance, or speaking. Summary  Cervical radiculopathy happens when a nerve in the neck is pinched or bruised.  A nerve can get pinched from a bulging disk, arthritis, muscle spasms, or an injury to the neck.  Symptoms include pain, tingling, or numbness radiating from the neck into the arm or hand. Weakness can also occur in severe cases.  Treatment may include rest, wearing a cervical collar, and physical therapy. Medicines may be prescribed to help with pain. In severe cases, injections or surgery may be needed. This information is not intended to replace advice given to you by your health care provider. Make sure you discuss any questions you have with your health care provider. Document Released: 03/01/2001  Document Revised: 04/27/2018 Document Reviewed: 04/27/2018 Elsevier Patient Education  2020 [employer].

## 2018-12-18 LAB — CBC WITH DIFFERENTIAL/PLATELET
Absolute Monocytes: 482 cells/uL (ref 200–950)
Basophils Absolute: 72 cells/uL (ref 0–200)
Basophils Relative: 1 %
Eosinophils Absolute: 274 cells/uL (ref 15–500)
Eosinophils Relative: 3.8 %
HCT: 43.6 % (ref 38.5–50.0)
Hemoglobin: 14.9 g/dL (ref 13.2–17.1)
Lymphs Abs: 1584 cells/uL (ref 850–3900)
MCH: 28.5 pg (ref 27.0–33.0)
MCHC: 34.2 g/dL (ref 32.0–36.0)
MCV: 83.5 fL (ref 80.0–100.0)
MPV: 9.5 fL (ref 7.5–12.5)
Monocytes Relative: 6.7 %
Neutro Abs: 4788 cells/uL (ref 1500–7800)
Neutrophils Relative %: 66.5 %
Platelets: 256 10*3/uL (ref 140–400)
RBC: 5.22 10*6/uL (ref 4.20–5.80)
RDW: 13.1 % (ref 11.0–15.0)
Total Lymphocyte: 22 %
WBC: 7.2 10*3/uL (ref 3.8–10.8)

## 2018-12-18 LAB — COMPLETE METABOLIC PANEL WITH GFR
AG Ratio: 2 (calc) (ref 1.0–2.5)
ALT: 21 U/L (ref 9–46)
AST: 24 U/L (ref 10–35)
Albumin: 4.7 g/dL (ref 3.6–5.1)
Alkaline phosphatase (APISO): 61 U/L (ref 35–144)
BUN/Creatinine Ratio: 15 (calc) (ref 6–22)
BUN: 23 mg/dL (ref 7–25)
CO2: 28 mmol/L (ref 20–32)
Calcium: 10.5 mg/dL — ABNORMAL HIGH (ref 8.6–10.3)
Chloride: 103 mmol/L (ref 98–110)
Creat: 1.53 mg/dL — ABNORMAL HIGH (ref 0.70–1.25)
GFR, Est African American: 54 mL/min/{1.73_m2} — ABNORMAL LOW (ref >=60)
GFR, Est Non African American: 46 mL/min/{1.73_m2} — ABNORMAL LOW (ref >=60)
Globulin: 2.4 g/dL (calc) (ref 1.9–3.7)
Glucose, Bld: 101 mg/dL — ABNORMAL HIGH (ref 65–99)
Potassium: 4.9 mmol/L (ref 3.5–5.3)
Sodium: 140 mmol/L (ref 135–146)
Total Bilirubin: 0.7 mg/dL (ref 0.2–1.2)
Total Protein: 7.1 g/dL (ref 6.1–8.1)

## 2018-12-18 LAB — LIPID PANEL
Cholesterol: 198 mg/dL (ref <200)
HDL: 36 mg/dL — ABNORMAL LOW (ref >=40)
LDL Cholesterol (Calc): 130 mg/dL (calc) — ABNORMAL HIGH
Non-HDL Cholesterol (Calc): 162 mg/dL (calc) — ABNORMAL HIGH (ref <130)
Total CHOL/HDL Ratio: 5.5 (calc) — ABNORMAL HIGH (ref <5.0)
Triglycerides: 181 mg/dL — ABNORMAL HIGH (ref <150)

## 2018-12-18 LAB — HEMOGLOBIN A1C
Hgb A1c MFr Bld: 6.2 % of total Hgb — ABNORMAL HIGH (ref <5.7)
Mean Plasma Glucose: 131 (calc)
eAG (mmol/L): 7.3 (calc)

## 2018-12-18 LAB — TSH: TSH: 1.35 mIU/L (ref 0.40–4.50)

## 2018-12-18 LAB — MAGNESIUM: Magnesium: 1.6 mg/dL (ref 1.5–2.5)

## 2019-01-28 ENCOUNTER — Other Ambulatory Visit: Payer: Self-pay | Admitting: Internal Medicine

## 2019-01-28 DIAGNOSIS — I10 Essential (primary) hypertension: Secondary | ICD-10-CM

## 2019-01-28 MED ORDER — BISOPROLOL-HYDROCHLOROTHIAZIDE 2.5-6.25 MG PO TABS: ORAL_TABLET | ORAL | 1 refills | Status: DC

## 2019-03-07 ENCOUNTER — Telehealth: Payer: Self-pay | Admitting: *Deleted

## 2019-03-07 NOTE — Telephone Encounter (Signed)
Patient called and reported he feels dizzy at times and his blood sugar was 90 at 4:00 PM today. Per Dr Melford Aase, reduce his Metformin 500 mg XR from tablets daily to 2 tablets daily.  Patient is aware and will can back, if needed.

## 2019-04-09 ENCOUNTER — Other Ambulatory Visit: Payer: Self-pay | Admitting: Internal Medicine

## 2019-04-09 DIAGNOSIS — E1140 Type 2 diabetes mellitus with diabetic neuropathy, unspecified: Secondary | ICD-10-CM

## 2019-05-31 ENCOUNTER — Other Ambulatory Visit: Payer: Self-pay | Admitting: Internal Medicine

## 2019-05-31 DIAGNOSIS — E782 Mixed hyperlipidemia: Secondary | ICD-10-CM

## 2019-06-16 ENCOUNTER — Encounter: Payer: Self-pay | Admitting: Internal Medicine

## 2019-06-16 NOTE — Patient Instructions (Signed)

## 2019-06-16 NOTE — Progress Notes (Signed)
Annual  Screening/Preventative Visit  & Comprehensive Evaluation & Examination     This very nice 68 y.o. WWM  presents for a Screening /Preventative Visit & comprehensive evaluation and management of multiple medical co-morbidities.  Patient has been followed for HTN, HLD, T2_NIDDM and Vitamin D Deficiency. Patient's Gout is controlled with his Allopurinol.     HTN predates since 2000. Patient's BP has been controlled at home.  Today's BP is at goal - 130/84. Patient denies any cardiac symptoms as chest pain, palpitations, shortness of breath, dizziness or ankle swelling.     Patient is intolerant to Statins and his hyperlipidemia is not controlled with diet and Ezetimibe. Patient denies myalgias or other medication SE's. Last lipids were not at goal:  Lab Results  Component Value Date   CHOL 198 12/17/2018   HDL 36 (L) 12/17/2018   LDLCALC 130 (H) 12/17/2018   TRIG 181 (H) 12/17/2018   CHOLHDL 5.5 (H) 12/17/2018      Patient has hx/o T2_NIDDM  (2002) w/CKD3 (GFR 49) and patient denies reactive hypoglycemic symptoms, visual blurring or  diabetic polys, but does admit burning presthesias of his toes at night. Last A1c was not at goal:  Lab Results  Component Value Date   HGBA1C 6.2 (H) 12/17/2018       Finally, patient has history of Vitamin D Deficiency ("14" / 2010)  and last vitamin D was near goal (70-100):  Lab Results  Component Value Date   VD25OH 56 05/29/2018   Current Outpatient Medications on File Prior to Visit  Medication Sig  . allopurinol (ZYLOPRIM) 300 MG tablet TAKE ONE TABLET BY MOUTH DAILY  . BABY ASPIRIN PO Take 81 mg by mouth daily.  . Cholecalciferol (VITAMIN D PO) Take 5,000 Int'l Units by mouth daily.  . enalapril (VASOTEC) 20 MG tablet TAKE ONE TABLET BY MOUTH DAILY  . ezetimibe (ZETIA) 10 MG tablet Take 1 tablet Daily for Cholesterol  . glucose blood (FREESTYLE TEST STRIPS) test strip CHECK BLOOD SUGAR 1 TIME DAILY-DX-E11.22  . glucose monitoring kit  (FREESTYLE) monitoring kit Check blood sugar 1 time daily-DX-E11.22.  Marland Kitchen Lancets (FREESTYLE) lancets CHECK BLOOD SUGAR 1 TIME DAILY-DX-E11.22  . metFORMIN (GLUCOPHAGE-XR) 500 MG 24 hr tablet Take 2 tablets  2 x /day with Meals for Diabetes  . bisoprolol-hydrochlorothiazide (ZIAC) 2.5-6.25 MG tablet Take 1 tablet Daily for BP (Patient not taking: Reported on 06/17/2019)  . colchicine 0.6 MG tablet Take 1 to 2 tablets daily for gout attack and PRN.   No current facility-administered medications on file prior to visit.   Allergies  Allergen Reactions  . Crestor [Rosuvastatin]   . Welchol [Colesevelam Hcl]   . Zoloft [Sertraline Hcl]   . Penicillins Rash   Past Medical History:  Diagnosis Date  . Allergy   . Bone spur    Left Shoulder  . 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 ([place]) 07/18/2017  . Gout   . Hyperlipidemia   . Hypertension   . Other testicular hypofunction   . Type II or unspecified type diabetes mellitus without mention of complication, not stated as uncontrolled    Health Maintenance  Topic Date Due  . OPHTHALMOLOGY EXAM  07/21/2018  . INFLUENZA VACCINE  01/19/2019  . HEMOGLOBIN A1C  06/18/2019  . FOOT EXAM  06/15/2020  . COLONOSCOPY  11/07/2020  . TETANUS/TDAP  12/26/2022  . Hepatitis C Screening  Completed  . PNA vac Low Risk Adult  Completed   Immunization History  Administered Date(s) Administered  . PPD Test 02/07/2014, 02/16/2015  . Pneumococcal Conjugate-13 04/12/2017  . Pneumococcal Polysaccharide-23 11/01/2010, 05/29/2018  . Td 12/25/2012  . Zoster 12/25/2012   Last Colon - 11/07/2017 - Dr Loletha Carrow - polyps - recc 3 year f/u - due May 2022  Past Surgical History:  Procedure Laterality Date  . CYST REMOVAL HAND    . PROSTATE BIOPSY  2010  . WRIST SURGERY Right 1998   Family History  Problem Relation Age of Onset  . Heart disease Father   . Kidney disease Father   . Heart failure Father   . Heart disease Mother 67       stent  . Colon  cancer Neg Hx   . Esophageal cancer Neg Hx   . Liver cancer Neg Hx   . Pancreatic cancer Neg Hx   . Rectal cancer Neg Hx   . Stomach cancer Neg Hx    Social History   Socioeconomic History  . Marital status: Widowed  . Number of children: 1 daughter  Occupational History  . Works Interior and spatial designer  Tobacco Use  . Smoking status: Never Smoker  . Smokeless tobacco: Never Used  Substance and Sexual Activity  . Alcohol use: No  . Drug use: No  . Sexual activity: No    ROS Constitutional: Denies fever, chills, weight loss/gain, headaches, insomnia,  night sweats or change in appetite. Does c/o fatigue. Eyes: Denies redness, blurred vision, diplopia, discharge, itchy or watery eyes.  ENT: Denies discharge, congestion, post nasal drip, epistaxis, sore throat, earache, hearing loss, dental pain, Tinnitus, Vertigo, Sinus pain or snoring.  Cardio: Denies chest pain, palpitations, irregular heartbeat, syncope, dyspnea, diaphoresis, orthopnea, PND, claudication or edema Respiratory: denies cough, dyspnea, DOE, pleurisy, hoarseness, laryngitis or wheezing.  Gastrointestinal: Denies dysphagia, heartburn, reflux, water brash, pain, cramps, nausea, vomiting, bloating, diarrhea, constipation, hematemesis, melena, hematochezia, jaundice or hemorrhoids Genitourinary: Denies dysuria, frequency, discharge, hematuria or flank pain. Has urgency, nocturia x 2-3 & occasional hesitancy. Has hx/o elevated PSA. Musculoskeletal: Denies arthralgia, myalgia, stiffness, Jt. Swelling, pain, limp or strain/sprain. Denies Falls. Skin: Denies puritis, rash, hives, warts, acne, eczema or change in skin lesion Neuro: No weakness, tremor, incoordination, spasms, paresthesia or pain Psychiatric: Denies confusion, memory loss or sensory loss. Denies Depression. Endocrine: Denies change in weight, skin, hair change, nocturia, and paresthesia, diabetic polys, visual blurring or hyper / hypo glycemic episodes.    Heme/Lymph: No excessive bleeding, bruising or enlarged lymph nodes.  Physical Exam  BP 130/84   Pulse 76   Temp (!) 97 F (36.1 C)   Resp 16   Ht '5\' 9"'$  (1.753 m)   Wt 162 lb 3.2 oz (73.6 kg)   BMI 23.95 kg/m   General Appearance: Well nourished and well groomed and in no apparent distress.  Eyes: PERRLA, EOMs, conjunctiva no swelling or erythema, normal fundi and vessels. Sinuses: No frontal/maxillary tenderness ENT/Mouth: EACs patent / TMs  nl. Nares clear without erythema, swelling, mucoid exudates. Oral hygiene is good. No erythema, swelling, or exudate. Tongue normal, non-obstructing. Tonsils not swollen or erythematous. Hearing normal.  Neck: Supple, thyroid not palpable. No bruits, nodes or JVD. Respiratory: Respiratory effort normal.  BS equal and clear bilateral without rales, rhonci, wheezing or stridor. Cardio: Heart sounds are normal with regular rate and rhythm and no murmurs, rubs or gallops. Peripheral pulses are normal and equal bilaterally without edema. No aortic or femoral bruits. Chest: symmetric with normal excursions and percussion.  Abdomen: Soft, with Nl  bowel sounds. Nontender, no guarding, rebound, hernias, masses, or organomegaly.  Lymphatics: Non tender without lymphadenopathy.  Musculoskeletal: Full ROM all peripheral extremities, joint stability, 5/5 strength, and normal gait. Skin: Warm and dry without rashes, lesions, cyanosis, clubbing or  ecchymosis.  Neuro: Cranial nerves intact, reflexes equal bilaterally. Normal muscle tone, no cerebellar symptoms. Sensation intact to touch, vibratory and Monofilament to the toes bilaterally.  Pysch: Alert and oriented X 3 with normal affect, insight and judgment appropriate.   Assessment and Plan  1. Annual Preventative/Screening Exam   1. Encounter for general adult medical examination with abnormal findings   2. Essential hypertension  - EKG 12-Lead - [place], retroperitnl abd,  ltd - Urinalysis, Routine w  reflex microscopic - Microalbumin / Creatinine Urine Ratio - CBC with Diff - COMPLETE METABOLIC PANEL WITH GFR - Magnesium - TSH  3. Hyperlipidemia associated with type 2 diabetes mellitus (HCC)  - EKG 12-Lead - [place], retroperitnl abd,  ltd - Lipid Profile - TSH  4. Type 2 diabetes mellitus with stage 3a chronic kidney disease, without long-term current use of insulin (HCC)  - EKG 12-Lead - [place], retroperitnl abd,  ltd - Urinalysis, Routine w reflex microscopic - Microalbumin / Creatinine Urine Ratio - Hemoglobin A1c ([facility]) - Insulin, random  5. Vitamin D deficiency  - Vitamin D (25 hydroxy)  6. Idiopathic gout, unspecified chronicity, unspecified site  - Uric acid  7. Diabetic polyneuropathy associated with type 2 diabetes mellitus (HCC)  - HM DIABETES FOOT EXAM - LOW EXTREMITY NEUR EXAM DOCUM - Hemoglobin A1c ([facility]) - Insulin, random  8. Statin intolerance   9. BPH with obstruction/lower urinary tract symptoms  - PSA  10. Elevated PSA  - PSA  11. Screening for colorectal cancer  - POC Hemoccult Bld/Stl (3-Cd Home Screen); Future  12. Screening for ischemic heart disease  - EKG 12-Lead  13. FHx: heart disease  - EKG 12-Lead - [place], retroperitnl abd,  ltd  14. Screening for AAA (aortic abdominal aneurysm)  - [place], retroperitnl abd,  ltd  15. Medication management  - Urinalysis, Routine w reflex microscopic - Microalbumin / Creatinine Urine Ratio - CBC with Diff - COMPLETE METABOLIC PANEL WITH GFR - Magnesium - Lipid Profile - TSH - Hemoglobin A1c ([facility]) - Insulin, random - Vitamin D (25 hydroxy)  16. Diabetic neuropathy, painful (HCC)  - gabapentin (NEURONTIN) 100 MG capsule; Take 1 to 2 capsules 2 x /day at Suppertime & Bedtime for Neuropathy Pain  Dispense: 360 capsule; Refill: 3            Patient was counseled in prudent diet, weight control to achieve/maintain BMI less than 25, BP monitoring, regular exercise and medications as  discussed.  Discussed med effects and SE's. Routine screening labs and tests as requested with regular follow-up as recommended. Over 40 minutes of exam, counseling, chart review and high complex critical decision making was performed   Kirtland Bouchard, MD

## 2019-06-17 ENCOUNTER — Other Ambulatory Visit: Payer: Self-pay

## 2019-06-17 ENCOUNTER — Ambulatory Visit (INDEPENDENT_AMBULATORY_CARE_PROVIDER_SITE_OTHER): Payer: PPO | Admitting: Internal Medicine

## 2019-06-17 VITALS — BP 130/84 | HR 76 | Temp 97.0°F | Resp 16 | Ht 69.0 in | Wt 162.2 lb

## 2019-06-17 DIAGNOSIS — M1000 Idiopathic gout, unspecified site: Secondary | ICD-10-CM

## 2019-06-17 DIAGNOSIS — E1142 Type 2 diabetes mellitus with diabetic polyneuropathy: Secondary | ICD-10-CM | POA: Diagnosis not present

## 2019-06-17 DIAGNOSIS — Z79899 Other long term (current) drug therapy: Secondary | ICD-10-CM

## 2019-06-17 DIAGNOSIS — N1831 Chronic kidney disease, stage 3a: Secondary | ICD-10-CM | POA: Diagnosis not present

## 2019-06-17 DIAGNOSIS — E1169 Type 2 diabetes mellitus with other specified complication: Secondary | ICD-10-CM

## 2019-06-17 DIAGNOSIS — R972 Elevated prostate specific antigen [PSA]: Secondary | ICD-10-CM

## 2019-06-17 DIAGNOSIS — E1140 Type 2 diabetes mellitus with diabetic neuropathy, unspecified: Secondary | ICD-10-CM

## 2019-06-17 DIAGNOSIS — Z0000 Encounter for general adult medical examination without abnormal findings: Secondary | ICD-10-CM

## 2019-06-17 DIAGNOSIS — E559 Vitamin D deficiency, unspecified: Secondary | ICD-10-CM | POA: Diagnosis not present

## 2019-06-17 DIAGNOSIS — Z136 Encounter for screening for cardiovascular disorders: Secondary | ICD-10-CM | POA: Diagnosis not present

## 2019-06-17 DIAGNOSIS — E785 Hyperlipidemia, unspecified: Secondary | ICD-10-CM | POA: Diagnosis not present

## 2019-06-17 DIAGNOSIS — I10 Essential (primary) hypertension: Secondary | ICD-10-CM

## 2019-06-17 DIAGNOSIS — N138 Other obstructive and reflux uropathy: Secondary | ICD-10-CM

## 2019-06-17 DIAGNOSIS — E1122 Type 2 diabetes mellitus with diabetic chronic kidney disease: Secondary | ICD-10-CM

## 2019-06-17 DIAGNOSIS — Z789 Other specified health status: Secondary | ICD-10-CM

## 2019-06-17 DIAGNOSIS — Z1211 Encounter for screening for malignant neoplasm of colon: Secondary | ICD-10-CM

## 2019-06-17 DIAGNOSIS — E1121 Type 2 diabetes mellitus with diabetic nephropathy: Secondary | ICD-10-CM | POA: Diagnosis not present

## 2019-06-17 DIAGNOSIS — N401 Enlarged prostate with lower urinary tract symptoms: Secondary | ICD-10-CM | POA: Diagnosis not present

## 2019-06-17 DIAGNOSIS — Z8249 Family history of ischemic heart disease and other diseases of the circulatory system: Secondary | ICD-10-CM

## 2019-06-17 DIAGNOSIS — Z0001 Encounter for general adult medical examination with abnormal findings: Secondary | ICD-10-CM

## 2019-06-17 MED ORDER — ALLOPURINOL 300 MG PO TABS: ORAL_TABLET | ORAL | 3 refills | Status: DC

## 2019-06-17 MED ORDER — GABAPENTIN 100 MG PO CAPS: ORAL_CAPSULE | ORAL | 3 refills | Status: DC

## 2019-06-18 ENCOUNTER — Other Ambulatory Visit: Payer: Self-pay | Admitting: Internal Medicine

## 2019-06-18 DIAGNOSIS — E782 Mixed hyperlipidemia: Secondary | ICD-10-CM

## 2019-06-18 DIAGNOSIS — M1000 Idiopathic gout, unspecified site: Secondary | ICD-10-CM

## 2019-06-18 LAB — COMPLETE METABOLIC PANEL WITH GFR
AG Ratio: 2 (calc) (ref 1.0–2.5)
ALT: 20 U/L (ref 9–46)
AST: 20 U/L (ref 10–35)
Albumin: 4.9 g/dL (ref 3.6–5.1)
Alkaline phosphatase (APISO): 55 U/L (ref 35–144)
BUN/Creatinine Ratio: 16 (calc) (ref 6–22)
BUN: 28 mg/dL — ABNORMAL HIGH (ref 7–25)
CO2: 25 mmol/L (ref 20–32)
Calcium: 10 mg/dL (ref 8.6–10.3)
Chloride: 104 mmol/L (ref 98–110)
Creat: 1.73 mg/dL — ABNORMAL HIGH (ref 0.70–1.25)
GFR, Est African American: 46 mL/min/{1.73_m2} — ABNORMAL LOW (ref >=60)
GFR, Est Non African American: 40 mL/min/{1.73_m2} — ABNORMAL LOW (ref >=60)
Globulin: 2.5 g/dL (calc) (ref 1.9–3.7)
Glucose, Bld: 123 mg/dL — ABNORMAL HIGH (ref 65–99)
Potassium: 5 mmol/L (ref 3.5–5.3)
Sodium: 142 mmol/L (ref 135–146)
Total Bilirubin: 0.6 mg/dL (ref 0.2–1.2)
Total Protein: 7.4 g/dL (ref 6.1–8.1)

## 2019-06-18 LAB — URINALYSIS, ROUTINE W REFLEX MICROSCOPIC
Bilirubin Urine: NEGATIVE
Glucose, UA: NEGATIVE
Hgb urine dipstick: NEGATIVE
Ketones, ur: NEGATIVE
Leukocytes,Ua: NEGATIVE
Nitrite: NEGATIVE
Protein, ur: NEGATIVE
Specific Gravity, Urine: 1.021 (ref 1.001–1.03)
pH: <=5 (ref 5.0–8.0)

## 2019-06-18 LAB — INSULIN, RANDOM: Insulin: 12.2 u[IU]/mL

## 2019-06-18 LAB — HEMOGLOBIN A1C
Hgb A1c MFr Bld: 6.1 % of total Hgb — ABNORMAL HIGH (ref <5.7)
Mean Plasma Glucose: 128 (calc)
eAG (mmol/L): 7.1 (calc)

## 2019-06-18 LAB — LIPID PANEL
Cholesterol: 201 mg/dL — ABNORMAL HIGH (ref <200)
HDL: 41 mg/dL (ref >=40)
LDL Cholesterol (Calc): 130 mg/dL (calc) — ABNORMAL HIGH
Non-HDL Cholesterol (Calc): 160 mg/dL (calc) — ABNORMAL HIGH (ref <130)
Total CHOL/HDL Ratio: 4.9 (calc) (ref <5.0)
Triglycerides: 167 mg/dL — ABNORMAL HIGH (ref <150)

## 2019-06-18 LAB — CBC WITH DIFFERENTIAL/PLATELET
Absolute Monocytes: 561 cells/uL (ref 200–950)
Basophils Absolute: 77 cells/uL (ref 0–200)
Basophils Relative: 0.9 %
Eosinophils Absolute: 238 cells/uL (ref 15–500)
Eosinophils Relative: 2.8 %
HCT: 47.4 % (ref 38.5–50.0)
Hemoglobin: 15.6 g/dL (ref 13.2–17.1)
Lymphs Abs: 1811 cells/uL (ref 850–3900)
MCH: 27.7 pg (ref 27.0–33.0)
MCHC: 32.9 g/dL (ref 32.0–36.0)
MCV: 84 fL (ref 80.0–100.0)
MPV: 9.5 fL (ref 7.5–12.5)
Monocytes Relative: 6.6 %
Neutro Abs: 5814 cells/uL (ref 1500–7800)
Neutrophils Relative %: 68.4 %
Platelets: 270 10*3/uL (ref 140–400)
RBC: 5.64 10*6/uL (ref 4.20–5.80)
RDW: 12.8 % (ref 11.0–15.0)
Total Lymphocyte: 21.3 %
WBC: 8.5 10*3/uL (ref 3.8–10.8)

## 2019-06-18 LAB — PSA: PSA: 2.4 ng/mL (ref <=4.0)

## 2019-06-18 LAB — MICROALBUMIN / CREATININE URINE RATIO
Creatinine, Urine: 196 mg/dL (ref 20–320)
Microalb Creat Ratio: 5 mcg/mg creat (ref <30)
Microalb, Ur: 1 mg/dL

## 2019-06-18 LAB — MAGNESIUM: Magnesium: 1.9 mg/dL (ref 1.5–2.5)

## 2019-06-18 LAB — VITAMIN D 25 HYDROXY (VIT D DEFICIENCY, FRACTURES): Vit D, 25-Hydroxy: 74 ng/mL (ref 30–100)

## 2019-06-18 LAB — TSH: TSH: 1.61 mIU/L (ref 0.40–4.50)

## 2019-06-18 LAB — URIC ACID: Uric Acid, Serum: 9.2 mg/dL — ABNORMAL HIGH (ref 4.0–8.0)

## 2019-06-18 MED ORDER — ALLOPURINOL 300 MG PO TABS: ORAL_TABLET | ORAL | 3 refills | Status: DC

## 2019-06-18 MED ORDER — GEMFIBROZIL 600 MG PO TABS: ORAL_TABLET | ORAL | 3 refills | Status: DC

## 2019-07-12 ENCOUNTER — Emergency Department (HOSPITAL_COMMUNITY): Payer: PPO

## 2019-07-12 ENCOUNTER — Emergency Department (HOSPITAL_COMMUNITY)
Admission: EM | Admit: 2019-07-12 | Discharge: 2019-07-12 | Disposition: A | Discharge status: Home / Self Care | Payer: PPO | Attending: Emergency Medicine | Admitting: Emergency Medicine

## 2019-07-12 DIAGNOSIS — I129 Hypertensive chronic kidney disease with stage 1 through stage 4 chronic kidney disease, or unspecified chronic kidney disease: Secondary | ICD-10-CM | POA: Diagnosis not present

## 2019-07-12 DIAGNOSIS — W231XXA Caught, crushed, jammed, or pinched between stationary objects, initial encounter: Secondary | ICD-10-CM | POA: Diagnosis not present

## 2019-07-12 DIAGNOSIS — Y998 Other external cause status: Secondary | ICD-10-CM | POA: Diagnosis not present

## 2019-07-12 DIAGNOSIS — S52591A Other fractures of lower end of right radius, initial encounter for closed fracture: Secondary | ICD-10-CM | POA: Diagnosis not present

## 2019-07-12 DIAGNOSIS — N183 Chronic kidney disease, stage 3 unspecified: Secondary | ICD-10-CM | POA: Insufficient documentation

## 2019-07-12 DIAGNOSIS — Z79899 Other long term (current) drug therapy: Secondary | ICD-10-CM | POA: Diagnosis not present

## 2019-07-12 DIAGNOSIS — S5291XA Unspecified fracture of right forearm, initial encounter for closed fracture: Secondary | ICD-10-CM

## 2019-07-12 DIAGNOSIS — E1122 Type 2 diabetes mellitus with diabetic chronic kidney disease: Secondary | ICD-10-CM | POA: Insufficient documentation

## 2019-07-12 DIAGNOSIS — Z7982 Long term (current) use of aspirin: Secondary | ICD-10-CM | POA: Insufficient documentation

## 2019-07-12 DIAGNOSIS — Y929 Unspecified place or not applicable: Secondary | ICD-10-CM | POA: Insufficient documentation

## 2019-07-12 DIAGNOSIS — T07XXXA Unspecified multiple injuries, initial encounter: Secondary | ICD-10-CM | POA: Diagnosis not present

## 2019-07-12 DIAGNOSIS — Y9389 Activity, other specified: Secondary | ICD-10-CM | POA: Diagnosis not present

## 2019-07-12 DIAGNOSIS — R000 Tachycardia, unspecified: Secondary | ICD-10-CM | POA: Diagnosis not present

## 2019-07-12 DIAGNOSIS — S52614A Nondisplaced fracture of right ulna styloid process, initial encounter for closed fracture: Secondary | ICD-10-CM | POA: Diagnosis not present

## 2019-07-12 DIAGNOSIS — Z7984 Long term (current) use of oral hypoglycemic drugs: Secondary | ICD-10-CM | POA: Insufficient documentation

## 2019-07-12 DIAGNOSIS — R52 Pain, unspecified: Secondary | ICD-10-CM | POA: Diagnosis not present

## 2019-07-12 DIAGNOSIS — I959 Hypotension, unspecified: Secondary | ICD-10-CM | POA: Diagnosis not present

## 2019-07-12 DIAGNOSIS — E1140 Type 2 diabetes mellitus with diabetic neuropathy, unspecified: Secondary | ICD-10-CM | POA: Diagnosis not present

## 2019-07-12 DIAGNOSIS — S59911A Unspecified injury of right forearm, initial encounter: Secondary | ICD-10-CM | POA: Diagnosis present

## 2019-07-12 DIAGNOSIS — S52691A Other fracture of lower end of right ulna, initial encounter for closed fracture: Secondary | ICD-10-CM | POA: Diagnosis not present

## 2019-07-12 DIAGNOSIS — I10 Essential (primary) hypertension: Secondary | ICD-10-CM | POA: Diagnosis not present

## 2019-07-12 MED ORDER — MORPHINE SULFATE (PF) 4 MG/ML IV SOLN
4.0000 mg | Freq: Once | INTRAVENOUS | Status: AC
  Administered 2019-07-12: 4 mg via INTRAVENOUS
  Filled 2019-07-12: qty 1

## 2019-07-12 MED ORDER — ONDANSETRON 4 MG PO TBDP: 4.0000 mg | ORAL_TABLET | Freq: Once | ORAL | Status: DC

## 2019-07-12 MED ORDER — ONDANSETRON 4 MG PO TBDP
4.0000 mg | ORAL_TABLET | Freq: Once | ORAL | Status: AC
  Administered 2019-07-12: 4 mg via ORAL
  Filled 2019-07-12: qty 1

## 2019-07-12 MED ORDER — HYDROCODONE-ACETAMINOPHEN 5-325 MG PO TABS: 1.0000 | ORAL_TABLET | ORAL | 0 refills | Status: DC | PRN

## 2019-07-12 NOTE — ED Provider Notes (Signed)
[facility] EMERGENCY DEPARTMENT Provider Note   CSN: [id number] Arrival date & time: 07/12/19  1552     History Chief Complaint  Patient presents with  . Arm Injury    [name] is a 69 y.o. male.  Patient is a 69 year old male who presents with an arm injury.  He was working on a boat with his arm under the boat and the trailer and boat fell down onto his right forearm.  He denies any other injuries.  He said he did not hit his head.  There is no loss of consciousness.  Reportedly the boat was spinning his arm for about 2 to 3 minutes.  He denies any other injuries.  He said it did not hit his chest or abdomen and his arm injury is his only injury.  He states his tetanus shot is up-to-date.        Past Medical History:  Diagnosis Date  . Allergy   . Bone spur    Left Shoulder  . 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 ([place]) 07/18/2017  . Gout   . Hyperlipidemia   . Hypertension   . Other testicular hypofunction   . Type II or unspecified type diabetes mellitus without mention of complication, not stated as uncontrolled     Patient Active Problem List   Diagnosis Date Noted  . Diabetes, polyneuropathy ([place]) 12/17/2018  . Statin intolerance 12/17/2018  . 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 ([place]) 07/18/2017  . Vitamin D deficiency 08/29/2013  . Medication management 08/29/2013  . Hyperlipidemia associated with type 2 diabetes mellitus ([place])   . Hypertension   . Type 2 diabetes mellitus with stage 3 chronic kidney disease ([place])   . Testosterone deficiency   . Gout     Past Surgical History:  Procedure Laterality Date  . CYST REMOVAL HAND    . PROSTATE BIOPSY  2010  . WRIST SURGERY Right 1998       Family History  Problem Relation Age of Onset  . Heart disease Father   . Kidney disease Father   . Heart failure Father   . Heart disease Mother 68       stent  . Colon cancer Neg Hx   . Esophageal cancer Neg Hx   . Liver  cancer Neg Hx   . Pancreatic cancer Neg Hx   . Rectal cancer Neg Hx   . Stomach cancer Neg Hx     Social History   Tobacco Use  . Smoking status: Never Smoker  . Smokeless tobacco: Never Used  Substance Use Topics  . Alcohol use: No  . Drug use: No    Home Medications Prior to Admission medications   Medication Sig Start Date End Date Taking? Authorizing Provider  allopurinol (ZYLOPRIM) 300 MG tablet Take 1 tablet Daily to Prevent Gout 06/18/19   Unk Pinto, MD  BABY ASPIRIN PO Take 81 mg by mouth daily.    [provider]  Cholecalciferol (VITAMIN D PO) Take 5,000 Int'l Units by mouth daily.    [provider]  colchicine 0.6 MG tablet Take 1 to 2 tablets daily for gout attack and PRN. 01/24/17 02/09/18  Unk Pinto, MD  enalapril (VASOTEC) 20 MG tablet TAKE ONE TABLET BY MOUTH DAILY 07/10/18   Unk Pinto, MD  ezetimibe (ZETIA) 10 MG tablet Take 1 tablet Daily for Cholesterol 05/31/19   Unk Pinto, MD  gabapentin (NEURONTIN) 100 MG capsule Take 1  to 2 capsules 2 x /day at Suppertime & Bedtime for Neuropathy Pain 06/17/19   Unk Pinto, MD  gemfibrozil (LOPID) 600 MG tablet Take 1 tablet 2 x  /day with Meals for Cholesterol & Triglycerides 06/18/19   Unk Pinto, MD  glucose blood (FREESTYLE TEST STRIPS) test strip CHECK BLOOD SUGAR 1 TIME DAILY-DX-E11.22 05/30/18   Unk Pinto, MD  glucose monitoring kit (FREESTYLE) monitoring kit Check blood sugar 1 time daily-DX-E11.22. 05/30/18   Unk Pinto, MD  HYDROcodone-acetaminophen (NORCO/VICODIN) 5-325 MG tablet Take 1-2 tablets by mouth every 4 (four) hours as needed. 07/12/19   Malvin Johns, MD  Lancets (FREESTYLE) lancets CHECK BLOOD SUGAR 1 TIME DAILY-DX-E11.22 05/30/18   Unk Pinto, MD  metFORMIN (GLUCOPHAGE-XR) 500 MG 24 hr tablet Take 2 tablets  2 x /day with Meals for Diabetes 11/29/18   Unk Pinto, MD    Allergies    Crestor [rosuvastatin], Welchol  Melvyn Neth hcl], Zoloft [sertraline hcl], and Penicillins  Review of Systems   Review of Systems  Constitutional: Negative for activity change, appetite change and fever.  HENT: Negative for dental problem, nosebleeds and trouble swallowing.   Eyes: Negative for pain and visual disturbance.  Respiratory: Negative for shortness of breath.   Cardiovascular: Negative for chest pain.  Gastrointestinal: Negative for abdominal pain, nausea and vomiting.  Genitourinary: Negative for dysuria and hematuria.  Musculoskeletal: Positive for arthralgias. Negative for back pain, joint swelling and neck pain.  Skin: Positive for wound.  Neurological: Negative for weakness, numbness and headaches.  Psychiatric/Behavioral: Negative for confusion.    Physical Exam Updated Vital Signs BP (!) 144/76   Pulse 93   Temp 97.8 F (36.6 C) (Axillary)   Resp 15   SpO2 99%   Physical Exam Vitals reviewed.  Constitutional:      Appearance: He is well-developed.  HENT:     Head: Normocephalic and atraumatic.     Nose: Nose normal.  Eyes:     Conjunctiva/sclera: Conjunctivae normal.     Pupils: Pupils are equal, round, and reactive to light.  Neck:     Comments: No pain to the spine Cardiovascular:     Rate and Rhythm: Normal rate and regular rhythm.     Heart sounds: No murmur.     Comments: No evidence of external trauma to the chest or abdomen Pulmonary:     Effort: Pulmonary effort is normal. No respiratory distress.     Breath sounds: Normal breath sounds. No wheezing.  Chest:     Chest wall: No tenderness.  Abdominal:     General: Bowel sounds are normal. There is no distension.     Palpations: Abdomen is soft.     Tenderness: There is no abdominal tenderness.  Musculoskeletal:     Comments: Patient has a deformity to the distal third of his right forearm with underlying tenderness.  There is a skin tear more proximally to this which appears to be superficial.  He has normal sensation in  all nerve distributions distally.  He has seemingly normal motor function although he has limited movement at the wrist due to his forearm pain.  Radial pulses are intact.  Skin:    General: Skin is warm and dry.     Capillary Refill: Capillary refill takes less than 2 seconds.  Neurological:     Mental Status: He is alert and oriented to person, place, and time.     ED Results / Procedures / Treatments   Labs (all labs ordered are listed,  but only abnormal results are displayed) Labs Reviewed - No data to display  EKG None  Radiology DG Forearm Right  Result Date: 07/12/2019 CLINICAL DATA:  Crush injury to right forearm, pain EXAM: RIGHT FOREARM - 2 VIEW COMPARISON:  None. FINDINGS: Acute transversely oriented fracture of the distal radial diaphysis with 4 mm of anterior displacement and mild anterior apex angulation. Nondisplaced transversely oriented fracture of the distal ulnar diaphysis. Alignment at the elbow and wrist appear anatomic. No additional fractures are identified. There is soft tissue swelling of the forearm. IMPRESSION: 1. Acute mildly displaced and angulated fracture of the distal radial diaphysis. 2. Nondisplaced transversely oriented fracture of the distal ulnar diaphysis. Electronically Signed   By: Davina Poke D.O.   On: 07/12/2019 16:57    Procedures Procedures (including critical care time)  Medications Ordered in ED Medications  morphine 4 MG/ML injection 4 mg (4 mg Intravenous Given 07/12/19 1611)  morphine 4 MG/ML injection 4 mg (4 mg Intravenous Given 07/12/19 1902)    ED Course  I have reviewed the triage vital signs and the nursing notes.  Pertinent labs & imaging results that were available during my care of the patient were reviewed by me and considered in my medical decision making (see chart for details).    MDM Rules/Calculators/A&P                      Patient is a 70 year old male who had an isolated injury to his right arm.  X-rays  show evidence of a midshaft radius/ulna fracture with some angulation of the radius component.  He is neurovascularly intact.  He has a skin tear which is more proximal to the fracture and appears to be superficial.  I do not see any evidence of an open fracture.  His tetanus shot is up-to-date.  I spoke with Dr. Doreatha Martin who advises to place patient in a sugar tong splint and he will follow the patient up on Tuesday and likely schedule surgery for operative repair.  Patient was also given a sling to use.  He was given a prescription for Vicodin to use for pain. Final Clinical Impression(s) / ED Diagnoses Final diagnoses:  Closed fracture of right forearm, initial encounter    Rx / DC Orders ED Discharge Orders         Ordered    HYDROcodone-acetaminophen (NORCO/VICODIN) 5-325 MG tablet  Every 4 hours PRN     07/12/19 1932           Malvin Johns, MD 07/12/19 1935

## 2019-07-12 NOTE — ED Triage Notes (Signed)
Pt BIB [facility] w/ c/o right arm crush injury. Pt's arm was pinned by his boat for 2-3 minutes, per EMS. Pulses weak on scene, 4+ atm. VS stable, NAD noted.

## 2019-07-12 NOTE — ED Notes (Signed)
Pt became slightly nauseous d/t morphine during discharge. Zofran given. Wife called to pick up pt.

## 2019-07-12 NOTE — Progress Notes (Signed)
Orthopedic Tech Progress Note Patient Details:  [name] Oct 24, 1950 [id number]  Ortho Devices Type of Ortho Device: Arm sling, Sugartong splint Ortho Device/Splint Location: RUE Ortho Device/Splint Interventions: Application, Ordered   Post Interventions Patient Tolerated: Well Instructions Provided: Care of device, Adjustment of device   Janit Pagan 07/12/2019, 7:19 PM

## 2019-07-12 NOTE — ED Notes (Signed)
Discharge instructions discussed with pt. Pt verbalized understanding. Pt stable and ambulatory. No signature pad available. 

## 2019-07-16 ENCOUNTER — Ambulatory Visit: Payer: Self-pay | Admitting: Student

## 2019-07-16 DIAGNOSIS — S52301A Unspecified fracture of shaft of right radius, initial encounter for closed fracture: Secondary | ICD-10-CM

## 2019-07-16 DIAGNOSIS — S52201A Unspecified fracture of shaft of right ulna, initial encounter for closed fracture: Secondary | ICD-10-CM | POA: Insufficient documentation

## 2019-07-16 DIAGNOSIS — S5291XA Unspecified fracture of right forearm, initial encounter for closed fracture: Secondary | ICD-10-CM | POA: Diagnosis not present

## 2019-07-17 ENCOUNTER — Other Ambulatory Visit: Payer: Self-pay

## 2019-07-17 ENCOUNTER — Other Ambulatory Visit (HOSPITAL_COMMUNITY)
Admission: RE | Admit: 2019-07-17 | Discharge: 2019-07-17 | Disposition: A | Discharge status: Home / Self Care | Payer: PPO | Source: Ambulatory Visit | Attending: Student | Admitting: Student

## 2019-07-17 ENCOUNTER — Encounter (HOSPITAL_COMMUNITY): Payer: Self-pay | Admitting: Student

## 2019-07-17 DIAGNOSIS — Z01812 Encounter for preprocedural laboratory examination: Secondary | ICD-10-CM | POA: Insufficient documentation

## 2019-07-17 DIAGNOSIS — Z20822 Contact with and (suspected) exposure to covid-19: Secondary | ICD-10-CM | POA: Diagnosis not present

## 2019-07-17 LAB — SARS CORONAVIRUS 2 (TAT 6-24 HRS): SARS Coronavirus 2: NEGATIVE

## 2019-07-17 NOTE — Progress Notes (Signed)
Pt denies SOB, chest pain, and being under the care of a cardiologist. Pt stated that Dr. Melford Aase is his PCP. Pt denies having an echo, stress test and cardiac cath. Pt denies having a chest x ray in the last year. Pt denies recent labs. Pt made aware to stop taking Aspirin (unless otherwise advised by surgeon), vitamins, fish oil and herbal medications. Do not take any NSAIDs ie: Ibuprofen, Advil, Naproxen (Aleve), Motrin, BC and Goody Powder. Pt made aware to hold Metformin on DOS. Pt made aware to check CBG every 2 hours prior to arrival to hospital on DOS. Pt made aware to treat a CBG < 70 with 4 ounces of cranberry juice, wait 15 minutes after intervention to recheck CBG, if CBG remains < 70, call Short Stay unit to speak with a nurse. Pt reminded to quarantine. Pt verbalized understanding of all pre-op instructions.
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
## 2019-07-17 NOTE — Anesthesia Preprocedure Evaluation (Addendum)
Anesthesia Evaluation  Patient identified by MRN, date of birth, ID band Patient awake    Reviewed: Allergy & Precautions, NPO status , Patient's Chart, lab work & pertinent test results  History of Anesthesia Complications (+) PONV and history of anesthetic complications  Airway Mallampati: II  TM Distance: >3 FB Neck ROM: Full    Dental  (+) Dental Advisory Given, Teeth Intact   Pulmonary neg pulmonary ROS,    Pulmonary exam normal        Cardiovascular hypertension, Pt. on medications Normal cardiovascular exam     Neuro/Psych negative neurological ROS  negative psych ROS   GI/Hepatic negative GI ROS, Neg liver ROS,   Endo/Other  diabetes, Type 2, Oral Hypoglycemic Agents  Renal/GU CRFRenal disease     Musculoskeletal negative musculoskeletal ROS (+)   Abdominal   Peds  Hematology negative hematology ROS (+)   Anesthesia Other Findings   Reproductive/Obstetrics                           Anesthesia Physical Anesthesia Plan  ASA: III  Anesthesia Plan: General   Post-op Pain Management:  Regional for Post-op pain   Induction: Intravenous  PONV Risk Score and Plan: 3 and Treatment may vary due to age or medical condition, Ondansetron and Dexamethasone  Airway Management Planned: LMA  Additional Equipment: None  Intra-op Plan:   Post-operative Plan: Extubation in OR  Informed Consent: I have reviewed the patients History and Physical, chart, labs and discussed the procedure including the risks, benefits and alternatives for the proposed anesthesia with the patient or authorized representative who has indicated his/her understanding and acceptance.     Dental advisory given  Plan Discussed with: CRNA and Anesthesiologist  Anesthesia Plan Comments:      Anesthesia Quick Evaluation

## 2019-07-18 ENCOUNTER — Encounter (HOSPITAL_COMMUNITY)
Admission: RE | Disposition: A | Discharge status: Home / Self Care | Payer: Self-pay | Source: Home / Self Care | Attending: Student

## 2019-07-18 ENCOUNTER — Other Ambulatory Visit: Payer: Self-pay

## 2019-07-18 ENCOUNTER — Encounter (HOSPITAL_COMMUNITY): Payer: Self-pay | Admitting: Student

## 2019-07-18 ENCOUNTER — Ambulatory Visit (HOSPITAL_COMMUNITY): Payer: PPO

## 2019-07-18 ENCOUNTER — Ambulatory Visit (HOSPITAL_COMMUNITY): Payer: PPO | Admitting: Physician Assistant

## 2019-07-18 ENCOUNTER — Ambulatory Visit (HOSPITAL_COMMUNITY)
Admission: RE | Admit: 2019-07-18 | Discharge: 2019-07-18 | Disposition: A | Discharge status: Home / Self Care | Payer: PPO | Attending: Student | Admitting: Student

## 2019-07-18 DIAGNOSIS — S52301A Unspecified fracture of shaft of right radius, initial encounter for closed fracture: Secondary | ICD-10-CM | POA: Diagnosis not present

## 2019-07-18 DIAGNOSIS — S52691A Other fracture of lower end of right ulna, initial encounter for closed fracture: Secondary | ICD-10-CM | POA: Diagnosis not present

## 2019-07-18 DIAGNOSIS — Z7984 Long term (current) use of oral hypoglycemic drugs: Secondary | ICD-10-CM | POA: Insufficient documentation

## 2019-07-18 DIAGNOSIS — S52301D Unspecified fracture of shaft of right radius, subsequent encounter for closed fracture with routine healing: Secondary | ICD-10-CM | POA: Diagnosis not present

## 2019-07-18 DIAGNOSIS — W228XXA Striking against or struck by other objects, initial encounter: Secondary | ICD-10-CM | POA: Insufficient documentation

## 2019-07-18 DIAGNOSIS — E1122 Type 2 diabetes mellitus with diabetic chronic kidney disease: Secondary | ICD-10-CM | POA: Insufficient documentation

## 2019-07-18 DIAGNOSIS — E559 Vitamin D deficiency, unspecified: Secondary | ICD-10-CM | POA: Diagnosis not present

## 2019-07-18 DIAGNOSIS — G8918 Other acute postprocedural pain: Secondary | ICD-10-CM | POA: Diagnosis not present

## 2019-07-18 DIAGNOSIS — N183 Chronic kidney disease, stage 3 unspecified: Secondary | ICD-10-CM | POA: Insufficient documentation

## 2019-07-18 DIAGNOSIS — S52201D Unspecified fracture of shaft of right ulna, subsequent encounter for closed fracture with routine healing: Secondary | ICD-10-CM | POA: Diagnosis not present

## 2019-07-18 DIAGNOSIS — Z419 Encounter for procedure for purposes other than remedying health state, unspecified: Secondary | ICD-10-CM

## 2019-07-18 DIAGNOSIS — M109 Gout, unspecified: Secondary | ICD-10-CM | POA: Insufficient documentation

## 2019-07-18 DIAGNOSIS — I129 Hypertensive chronic kidney disease with stage 1 through stage 4 chronic kidney disease, or unspecified chronic kidney disease: Secondary | ICD-10-CM | POA: Insufficient documentation

## 2019-07-18 DIAGNOSIS — Z79899 Other long term (current) drug therapy: Secondary | ICD-10-CM | POA: Insufficient documentation

## 2019-07-18 DIAGNOSIS — S52201A Unspecified fracture of shaft of right ulna, initial encounter for closed fracture: Secondary | ICD-10-CM | POA: Insufficient documentation

## 2019-07-18 DIAGNOSIS — S52391A Other fracture of shaft of radius, right arm, initial encounter for closed fracture: Secondary | ICD-10-CM | POA: Insufficient documentation

## 2019-07-18 DIAGNOSIS — S5291XA Unspecified fracture of right forearm, initial encounter for closed fracture: Secondary | ICD-10-CM

## 2019-07-18 DIAGNOSIS — E785 Hyperlipidemia, unspecified: Secondary | ICD-10-CM | POA: Insufficient documentation

## 2019-07-18 HISTORY — DX: Other specified postprocedural states: R11.2

## 2019-07-18 HISTORY — DX: Other injury of unspecified body region, initial encounter: T14.8XXA

## 2019-07-18 HISTORY — PX: ORIF RADIAL FRACTURE: SHX5113

## 2019-07-18 HISTORY — DX: Other specified postprocedural states: Z98.890

## 2019-07-18 LAB — GLUCOSE, CAPILLARY
Glucose-Capillary: 120 mg/dL — ABNORMAL HIGH (ref 70–99)
Glucose-Capillary: 119 mg/dL — ABNORMAL HIGH (ref 70–99)

## 2019-07-18 LAB — CBC
HCT: 45.9 % (ref 39.0–52.0)
Hemoglobin: 15 g/dL (ref 13.0–17.0)
MCH: 28.1 pg (ref 26.0–34.0)
MCHC: 32.7 g/dL (ref 30.0–36.0)
MCV: 86 fL (ref 80.0–100.0)
Platelets: 259 10*3/uL (ref 150–400)
RBC: 5.34 MIL/uL (ref 4.22–5.81)
RDW: 13.2 % (ref 11.5–15.5)
WBC: 7.4 10*3/uL (ref 4.0–10.5)
nRBC: 0 % (ref 0.0–0.2)

## 2019-07-18 LAB — BASIC METABOLIC PANEL
Anion gap: 12 (ref 5–15)
BUN: 20 mg/dL (ref 8–23)
CO2: 21 mmol/L — ABNORMAL LOW (ref 22–32)
Calcium: 9.7 mg/dL (ref 8.9–10.3)
Chloride: 110 mmol/L (ref 98–111)
Creatinine, Ser: 1.52 mg/dL — ABNORMAL HIGH (ref 0.61–1.24)
GFR calc Af Amer: 54 mL/min — ABNORMAL LOW (ref >60)
GFR calc non Af Amer: 46 mL/min — ABNORMAL LOW (ref >60)
Glucose, Bld: 133 mg/dL — ABNORMAL HIGH (ref 70–99)
Potassium: 3.8 mmol/L (ref 3.5–5.1)
Sodium: 143 mmol/L (ref 135–145)

## 2019-07-18 SURGERY — OPEN REDUCTION INTERNAL FIXATION (ORIF) RADIAL FRACTURE
Anesthesia: General | Site: Arm Lower | Laterality: Right

## 2019-07-18 MED ORDER — FENTANYL CITRATE (PF) 250 MCG/5ML IJ SOLN
INTRAMUSCULAR | Status: DC | PRN
  Administered 2019-07-18 (×4): 50 ug via INTRAVENOUS

## 2019-07-18 MED ORDER — DEXAMETHASONE SODIUM PHOSPHATE 10 MG/ML IJ SOLN
INTRAMUSCULAR | Status: AC
  Filled 2019-07-18: qty 1

## 2019-07-18 MED ORDER — ONDANSETRON HCL 4 MG/2ML IJ SOLN: 4.0000 mg | Freq: Once | INTRAMUSCULAR | Status: DC | PRN

## 2019-07-18 MED ORDER — OXYCODONE HCL 5 MG/5ML PO SOLN: 5.0000 mg | Freq: Once | ORAL | Status: DC | PRN

## 2019-07-18 MED ORDER — MIDAZOLAM HCL 5 MG/5ML IJ SOLN
INTRAMUSCULAR | Status: DC | PRN
  Administered 2019-07-18 (×2): 1 mg via INTRAVENOUS

## 2019-07-18 MED ORDER — VANCOMYCIN HCL 1000 MG IV SOLR
INTRAVENOUS | Status: AC
  Filled 2019-07-18: qty 1000

## 2019-07-18 MED ORDER — BUPIVACAINE LIPOSOME 1.3 % IJ SUSP
INTRAMUSCULAR | Status: DC | PRN
  Administered 2019-07-18: 10 mL via PERINEURAL

## 2019-07-18 MED ORDER — EPHEDRINE SULFATE-NACL 50-0.9 MG/10ML-% IV SOSY
PREFILLED_SYRINGE | INTRAVENOUS | Status: DC | PRN
  Administered 2019-07-18 (×2): 5 mg via INTRAVENOUS

## 2019-07-18 MED ORDER — LACTATED RINGERS IV SOLN: INTRAVENOUS | Status: DC | PRN

## 2019-07-18 MED ORDER — VANCOMYCIN HCL 1000 MG IV SOLR
INTRAVENOUS | Status: DC | PRN
  Administered 2019-07-18: 1000 mg

## 2019-07-18 MED ORDER — ONDANSETRON HCL 4 MG/2ML IJ SOLN
INTRAMUSCULAR | Status: DC | PRN
  Administered 2019-07-18: 4 mg via INTRAVENOUS

## 2019-07-18 MED ORDER — 0.9 % SODIUM CHLORIDE (POUR BTL) OPTIME
TOPICAL | Status: DC | PRN
  Administered 2019-07-18: 1000 mL

## 2019-07-18 MED ORDER — CHLORHEXIDINE GLUCONATE 4 % EX LIQD: 60.0000 mL | Freq: Once | CUTANEOUS | Status: DC

## 2019-07-18 MED ORDER — FENTANYL CITRATE (PF) 250 MCG/5ML IJ SOLN
INTRAMUSCULAR | Status: AC
  Filled 2019-07-18: qty 5

## 2019-07-18 MED ORDER — PROPOFOL 10 MG/ML IV BOLUS
INTRAVENOUS | Status: DC | PRN
  Administered 2019-07-18: 150 mg via INTRAVENOUS

## 2019-07-18 MED ORDER — HYDROCODONE-ACETAMINOPHEN 7.5-325 MG PO TABS: 1.0000 | ORAL_TABLET | Freq: Four times a day (QID) | ORAL | 0 refills | Status: DC | PRN

## 2019-07-18 MED ORDER — PROMETHAZINE HCL 12.5 MG PO TABS: 12.5000 mg | ORAL_TABLET | Freq: Four times a day (QID) | ORAL | 0 refills | Status: DC | PRN

## 2019-07-18 MED ORDER — LIDOCAINE 2% (20 MG/ML) 5 ML SYRINGE
INTRAMUSCULAR | Status: DC | PRN
  Administered 2019-07-18: 40 mg via INTRAVENOUS

## 2019-07-18 MED ORDER — ONDANSETRON HCL 4 MG/2ML IJ SOLN
INTRAMUSCULAR | Status: AC
  Filled 2019-07-18: qty 2

## 2019-07-18 MED ORDER — CLINDAMYCIN PHOSPHATE 900 MG/50ML IV SOLN
900.0000 mg | INTRAVENOUS | Status: AC
  Administered 2019-07-18: 900 mg via INTRAVENOUS
  Filled 2019-07-18: qty 50

## 2019-07-18 MED ORDER — DEXAMETHASONE SODIUM PHOSPHATE 10 MG/ML IJ SOLN
INTRAMUSCULAR | Status: DC | PRN
  Administered 2019-07-18: 4 mg via INTRAVENOUS

## 2019-07-18 MED ORDER — OXYCODONE HCL 5 MG PO TABS: 5.0000 mg | ORAL_TABLET | Freq: Once | ORAL | Status: DC | PRN

## 2019-07-18 MED ORDER — PROPOFOL 10 MG/ML IV BOLUS
INTRAVENOUS | Status: AC
  Filled 2019-07-18: qty 40

## 2019-07-18 MED ORDER — FENTANYL CITRATE (PF) 100 MCG/2ML IJ SOLN
INTRAMUSCULAR | Status: AC
  Filled 2019-07-18: qty 2

## 2019-07-18 MED ORDER — FENTANYL CITRATE (PF) 100 MCG/2ML IJ SOLN
25.0000 ug | INTRAMUSCULAR | Status: DC | PRN
  Administered 2019-07-18 (×2): 25 ug via INTRAVENOUS
  Administered 2019-07-18: 50 ug via INTRAVENOUS

## 2019-07-18 MED ORDER — BUPIVACAINE HCL (PF) 0.5 % IJ SOLN
INTRAMUSCULAR | Status: DC | PRN
  Administered 2019-07-18: 15 mL via PERINEURAL

## 2019-07-18 MED ORDER — MIDAZOLAM HCL 2 MG/2ML IJ SOLN
INTRAMUSCULAR | Status: AC
  Filled 2019-07-18: qty 2

## 2019-07-18 SURGICAL SUPPLY — 55 items
BIT DRILL 2.5X110 QC LCP DISP (BIT) ×1 IMPLANT
BIT DRILL QC 2.5X135 (BIT) ×1 IMPLANT
BLADE CLIPPER SURG (BLADE) ×1 IMPLANT
BNDG CMPR 9X4 STRL LF SNTH (GAUZE/BANDAGES/DRESSINGS) ×1
BNDG ELASTIC 3X5.8 VLCR STR LF (GAUZE/BANDAGES/DRESSINGS) ×1 IMPLANT
BNDG ELASTIC 4X5.8 VLCR STR LF (GAUZE/BANDAGES/DRESSINGS) ×2 IMPLANT
BNDG ESMARK 4X9 LF (GAUZE/BANDAGES/DRESSINGS) ×2 IMPLANT
BRUSH SCRUB EZ PLAIN DRY (MISCELLANEOUS) ×4 IMPLANT
COVER SURGICAL LIGHT HANDLE (MISCELLANEOUS) ×2 IMPLANT
COVER WAND RF STERILE (DRAPES) ×1 IMPLANT
DECANTER SPIKE VIAL GLASS SM (MISCELLANEOUS) IMPLANT
DRAPE C-ARM 42X72 X-RAY (DRAPES) ×2 IMPLANT
DRSG EMULSION OIL 3X3 NADH (GAUZE/BANDAGES/DRESSINGS) ×2 IMPLANT
DRSG MEPITEL 3X4 ME34 (GAUZE/BANDAGES/DRESSINGS) ×1 IMPLANT
ELECT REM PT RETURN 9FT ADLT (ELECTROSURGICAL) ×2
ELECTRODE REM PT RTRN 9FT ADLT (ELECTROSURGICAL) ×1 IMPLANT
GAUZE SPONGE 4X4 12PLY STRL (GAUZE/BANDAGES/DRESSINGS) ×2 IMPLANT
GLOVE BIO SURGEON STRL SZ 6.5 (GLOVE) ×3 IMPLANT
GLOVE BIO SURGEON STRL SZ7.5 (GLOVE) ×3 IMPLANT
GLOVE BIO SURGEON STRL SZ8 (GLOVE) ×2 IMPLANT
GLOVE BIOGEL PI IND STRL 8 (GLOVE) ×1 IMPLANT
GLOVE BIOGEL PI INDICATOR 8 (GLOVE) ×1
GOWN STRL REUS W/ TWL LRG LVL3 (GOWN DISPOSABLE) ×2 IMPLANT
GOWN STRL REUS W/ TWL XL LVL3 (GOWN DISPOSABLE) ×1 IMPLANT
GOWN STRL REUS W/TWL LRG LVL3 (GOWN DISPOSABLE) ×6
GOWN STRL REUS W/TWL XL LVL3 (GOWN DISPOSABLE)
KIT BASIN OR (CUSTOM PROCEDURE TRAY) ×2 IMPLANT
KIT TURNOVER KIT B (KITS) ×2 IMPLANT
NDL HYPO 25GX1X1/2 BEV (NEEDLE) IMPLANT
NEEDLE HYPO 25GX1X1/2 BEV (NEEDLE) IMPLANT
NS IRRIG 1000ML POUR BTL (IV SOLUTION) ×2 IMPLANT
PACK ORTHO EXTREMITY (CUSTOM PROCEDURE TRAY) ×2 IMPLANT
PAD ARMBOARD 7.5X6 YLW CONV (MISCELLANEOUS) ×4 IMPLANT
PAD CAST 3X4 CTTN HI CHSV (CAST SUPPLIES) ×1 IMPLANT
PAD CAST 4YDX4 CTTN HI CHSV (CAST SUPPLIES) IMPLANT
PADDING CAST COTTON 3X4 STRL (CAST SUPPLIES) ×2
PADDING CAST COTTON 4X4 STRL (CAST SUPPLIES) ×2
PLATE LCP 3.5 7H 98 (Plate) ×1 IMPLANT
PROS LCP PLATE 6H 85MM (Plate) ×2 IMPLANT
PROSTHESIS LCP PLATE 6H 85MM (Plate) IMPLANT
SCREW CORTEX 3.5 14MM (Screw) ×2 IMPLANT
SCREW CORTEX 3.5 16MM (Screw) ×7 IMPLANT
SCREW CORTEX 3.5 18MM (Screw) ×3 IMPLANT
SCREW LOCK CORT ST 3.5X14 (Screw) IMPLANT
SCREW LOCK CORT ST 3.5X16 (Screw) IMPLANT
SCREW LOCK CORT ST 3.5X18 (Screw) IMPLANT
SUT ETHILON 3 0 PS 1 (SUTURE) ×4 IMPLANT
SUT VIC AB 0 CT3 27 (SUTURE) ×2 IMPLANT
SUT VIC AB 2-0 CT1 27 (SUTURE) ×4
SUT VIC AB 2-0 CT1 TAPERPNT 27 (SUTURE) IMPLANT
SYR CONTROL 10ML LL (SYRINGE) IMPLANT
TOWEL GREEN STERILE (TOWEL DISPOSABLE) ×4 IMPLANT
TOWEL GREEN STERILE FF (TOWEL DISPOSABLE) ×2 IMPLANT
TUBE CONNECTING 12X1/4 (SUCTIONS) ×2 IMPLANT
UNDERPAD 30X30 (UNDERPADS AND DIAPERS) ×2 IMPLANT

## 2019-07-18 NOTE — Interval H&P Note (Signed)
History and Physical Interval Note:  07/18/2019 7:18 AM  [name]  has presented today for surgery, with the diagnosis of Right forearm fracture.  The various methods of treatment have been discussed with the patient and family. After consideration of risks, benefits and other options for treatment, the patient has consented to  Procedure(s): OPEN REDUCTION INTERNAL FIXATION (ORIF) RADIUS AND ULNA FRACTURE (Right) as a surgical intervention.  The patient's history has been reviewed, patient examined, no change in status, stable for surgery.  I have reviewed the patient's chart and labs.  Questions were answered to the patient's satisfaction.     Lennette Bihari P Verle Brillhart

## 2019-07-18 NOTE — Anesthesia Postprocedure Evaluation (Signed)
Anesthesia Post Note  Patient: [name]  Procedure(s) Performed: OPEN REDUCTION INTERNAL FIXATION (ORIF) RADIUS AND ULNA FRACTURE (Right Arm Lower)     Patient location during evaluation: PACU Anesthesia Type: General Level of consciousness: awake and alert Pain management: pain level controlled Vital Signs Assessment: post-procedure vital signs reviewed and stable Respiratory status: spontaneous breathing, nonlabored ventilation and respiratory function stable Cardiovascular status: blood pressure returned to baseline and stable Postop Assessment: no apparent nausea or vomiting Anesthetic complications: no    Last Vitals:  Vitals:   07/18/19 1001 07/18/19 1014  BP: (!) 166/95   Pulse: 81 81  Resp: 17 18  Temp: 36.4 C 36.4 C  SpO2: 99% 99%    Last Pain:  Vitals:   07/18/19 1015  TempSrc:   PainSc: [place] Taronda Comacho

## 2019-07-18 NOTE — Transfer of Care (Signed)
Immediate Anesthesia Transfer of Care Note  Patient: [name]  Procedure(s) Performed: OPEN REDUCTION INTERNAL FIXATION (ORIF) RADIUS AND ULNA FRACTURE (Right Arm Lower)  Patient Location: PACU  Anesthesia Type:GA combined with regional for post-op pain  Level of Consciousness: awake, alert , oriented and patient cooperative  Airway & Oxygen Therapy: Patient Spontanous Breathing and Patient connected to nasal cannula oxygen  Post-op Assessment: Report given to RN and Post -op Vital signs reviewed and stable  Post vital signs: Reviewed and stable  Last Vitals:  Vitals Value Taken Time  BP 169/92 07/18/19 0916  Temp    Pulse 71 07/18/19 0918  Resp 13 07/18/19 0918  SpO2 99 % 07/18/19 0918  Vitals shown include unvalidated device data.  Last Pain:  Vitals:   07/18/19 0641  TempSrc:   PainSc: 0-No pain         Complications: No apparent anesthesia complications

## 2019-07-18 NOTE — Progress Notes (Signed)
Orthopedic Tech Progress Note Patient Details:  [name] [birth year] [id number]  Ortho Devices Type of Ortho Device: Arm sling Ortho Device/Splint Location: right Ortho Device/Splint Interventions: Application   Post Interventions Patient Tolerated: Well Instructions Provided: Care of device   [name] 07/18/2019, 12:33 PM
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
## 2019-07-18 NOTE — Discharge Instructions (Addendum)
Orthopaedic Trauma Service Discharge Instructions   General Discharge Instructions  WEIGHT BEARING STATUS: Lift no more than 1-2 lbs with right arm.  RANGE OF MOTION/ACTIVITY: Okay for unrestricted range of motion of the wrist and elbow. Okay to cast your fishing rod  Wound Care: You may remove surgical dressing on postoperative day #2 (Saturday, 07/20/2019).  Incisions can be left open to air if there is no drainage. If incision continues to have drainage, follow wound care instructions below. Okay to shower if no drainage from incisions.  DVT/PE prophylaxis: None  Diet: as you were eating previously.  Can use over the counter stool softeners and bowel preparations, such as Miralax, to help with bowel movements.  Narcotics can be constipating.  Be sure to drink plenty of fluids  PAIN MEDICATION USE AND EXPECTATIONS  You have likely been given narcotic medications to help control your pain.  After a traumatic event that results in an fracture (broken bone) with or without surgery, it is ok to use narcotic pain medications to help control one's pain.  We understand that everyone responds to pain differently and each individual patient will be evaluated on a regular basis for the continued need for narcotic medications. Ideally, narcotic medication use should last no more than 6-8 weeks (coinciding with fracture healing).   As a patient it is your responsibility as well to monitor narcotic medication use and report the amount and frequency you use these medications when you come to your office visit.   We would also advise that if you are using narcotic medications, you should take a dose prior to therapy to maximize you participation.  IF YOU ARE ON NARCOTIC MEDICATIONS IT IS NOT PERMISSIBLE TO OPERATE A MOTOR VEHICLE (MOTORCYCLE/CAR/TRUCK/MOPED) OR HEAVY MACHINERY DO NOT MIX NARCOTICS WITH OTHER CNS (CENTRAL NERVOUS SYSTEM) DEPRESSANTS SUCH AS ALCOHOL   STOP SMOKING OR USING NICOTINE  PRODUCTS!!!!  As discussed nicotine severely impairs your body's ability to heal surgical and traumatic wounds but also impairs bone healing.  Wounds and bone heal by forming microscopic blood vessels (angiogenesis) and nicotine is a vasoconstrictor (essentially, shrinks blood vessels).  Therefore, if vasoconstriction occurs to these microscopic blood vessels they essentially disappear and are unable to deliver necessary nutrients to the healing tissue.  This is one modifiable factor that you can do to dramatically increase your chances of healing your injury.    (This means no smoking, no nicotine gum, patches, etc)  DO NOT USE NONSTEROIDAL ANTI-INFLAMMATORY DRUGS (NSAID'S)  Using products such as Advil (ibuprofen), Aleve (naproxen), Motrin (ibuprofen) for additional pain control during fracture healing can delay and/or prevent the healing response.  If you would like to take over the counter (OTC) medication, Tylenol (acetaminophen) is ok.  However, some narcotic medications that are given for pain control contain acetaminophen as well. Therefore, you should not exceed more than 4000 mg of tylenol in a day if you do not have liver disease.  Also note that there are may OTC medicines, such as cold medicines and allergy medicines that my contain tylenol as well.  If you have any questions about medications and/or interactions please ask your doctor/PA or your pharmacist.      ICE AND ELEVATE INJURED/OPERATIVE EXTREMITY  Using ice and elevating the injured extremity above your heart can help with swelling and pain control.  Icing in a pulsatile fashion, such as 20 minutes on and 20 minutes off, can be followed.    Do not place ice directly on skin.  Make sure there is a barrier between to skin and the ice pack.    Using frozen items such as frozen peas works well as the conform nicely to the are that needs to be iced.  USE AN ACE WRAP OR TED HOSE FOR SWELLING CONTROL  In addition to icing and elevation,  Ace wraps or TED hose are used to help limit and resolve swelling.  It is recommended to use Ace wraps or TED hose until you are informed to stop.    When using Ace Wraps start the wrapping distally (farthest away from the body) and wrap proximally (closer to the body)   Example: If you had surgery on your leg or thing and you do not have a splint on, start the ace wrap at the toes and work your way up to the thigh        If you had surgery on your upper extremity and do not have a splint on, start the ace wrap at your fingers and work your way up to the upper arm  IF YOU ARE IN A SPLINT OR CAST DO NOT [place]   If your splint gets wet for any reason please contact the office immediately. You may shower in your splint or cast as long as you keep it dry.  This can be done by wrapping in a cast cover or garbage back (or similar)  Do Not stick any thing down your splint or cast such as pencils, money, or hangers to try and scratch yourself with.  If you feel itchy take benadryl as prescribed on the bottle for itching  IF YOU ARE IN A CAM BOOT (BLACK BOOT)  You may remove boot periodically. Perform daily dressing changes as noted below.  Wash the liner of the boot regularly and wear a sock when wearing the boot. It is recommended that you sleep in the boot until told otherwise   CALL THE OFFICE WITH ANY QUESTIONS OR CONCERNS: [phone]   VISIT OUR WEBSITE FOR ADDITIONAL INFORMATION: [email or web address]    Discharge Wound Care Instructions  Do NOT apply any ointments, solutions or lotions to pin sites or surgical wounds.  These prevent needed drainage and even though solutions like hydrogen peroxide kill bacteria, they also damage cells lining the pin sites that help fight infection.  Applying lotions or ointments can keep the wounds moist and can cause them to breakdown and open up as well. This can increase the risk for infection. When in doubt call the office.  Surgical  incisions should be dressed daily.  If any drainage is noted, use one layer of adaptic, then gauze, Kerlix, and an ace wrap.  Once the incision is completely dry and without drainage, it may be left open to air out.  Showering may begin 36-48 hours later.  Cleaning gently with soap and water.  Traumatic wounds should be dressed daily as well.    One layer of adaptic, gauze, Kerlix, then ace wrap.  The adaptic can be discontinued once the draining has ceased    If you have a wet to dry dressing: wet the gauze with saline the squeeze as much saline out so the gauze is moist (not soaking wet), place moistened gauze over wound, then place a dry gauze over the moist one, followed by Kerlix wrap, then ace wrap.

## 2019-07-18 NOTE — Anesthesia Procedure Notes (Signed)
Procedure Name: LMA Insertion Date/Time: 07/18/2019 7:39 AM Performed by: Colin Benton, CRNA Pre-anesthesia Checklist: Patient identified, Emergency Drugs available, Suction available and Patient being monitored Patient Re-evaluated:Patient Re-evaluated prior to induction Oxygen Delivery Method: Circle system utilized Preoxygenation: Pre-oxygenation with 100% oxygen Induction Type: IV induction Ventilation: Mask ventilation without difficulty LMA: LMA inserted LMA Size: 4.0 Number of attempts: 1 Placement Confirmation: positive ETCO2 and breath sounds checked- equal and bilateral Tube secured with: Tape Dental Injury: Teeth and Oropharynx as per pre-operative assessment

## 2019-07-18 NOTE — Anesthesia Procedure Notes (Signed)
Anesthesia Regional Block: Supraclavicular block   Pre-Anesthetic Checklist: ,, timeout performed, Correct Patient, Correct Site, Correct Laterality, Correct Procedure, Correct Position, site marked, Risks and benefits discussed,  Surgical consent,  Pre-op evaluation,  At surgeon's request and post-op pain management  Laterality: Right  Prep: chloraprep       Needles:  Injection technique: Single-shot  Needle Type: Echogenic Needle     Needle Length: 5cm  Needle Gauge: 21     Additional Needles:   Narrative:  Start time: 07/18/2019 6:54 AM End time: 07/18/2019 6:58 AM Injection made incrementally with aspirations every 5 mL.  Performed by: Personally  Anesthesiologist: Audry Pili, MD  Additional Notes: No pain on injection. No increased resistance to injection. Injection made in 5cc increments. Good needle visualization. Patient tolerated the procedure well.

## 2019-07-19 ENCOUNTER — Encounter: Payer: Self-pay | Admitting: *Deleted

## 2019-07-30 DIAGNOSIS — S52201D Unspecified fracture of shaft of right ulna, subsequent encounter for closed fracture with routine healing: Secondary | ICD-10-CM | POA: Diagnosis not present

## 2019-07-30 DIAGNOSIS — M25511 Pain in right shoulder: Secondary | ICD-10-CM | POA: Diagnosis not present

## 2019-07-30 DIAGNOSIS — S52301D Unspecified fracture of shaft of right radius, subsequent encounter for closed fracture with routine healing: Secondary | ICD-10-CM | POA: Diagnosis not present

## 2019-08-20 DIAGNOSIS — M25511 Pain in right shoulder: Secondary | ICD-10-CM | POA: Diagnosis not present

## 2019-08-20 DIAGNOSIS — S52301D Unspecified fracture of shaft of right radius, subsequent encounter for closed fracture with routine healing: Secondary | ICD-10-CM | POA: Diagnosis not present

## 2019-08-22 DIAGNOSIS — M25631 Stiffness of right wrist, not elsewhere classified: Secondary | ICD-10-CM | POA: Diagnosis not present

## 2019-08-22 DIAGNOSIS — M25611 Stiffness of right shoulder, not elsewhere classified: Secondary | ICD-10-CM | POA: Diagnosis not present

## 2019-08-22 DIAGNOSIS — M6281 Muscle weakness (generalized): Secondary | ICD-10-CM | POA: Diagnosis not present

## 2019-08-27 DIAGNOSIS — M25631 Stiffness of right wrist, not elsewhere classified: Secondary | ICD-10-CM | POA: Diagnosis not present

## 2019-08-27 DIAGNOSIS — M25611 Stiffness of right shoulder, not elsewhere classified: Secondary | ICD-10-CM | POA: Diagnosis not present

## 2019-08-27 DIAGNOSIS — M6281 Muscle weakness (generalized): Secondary | ICD-10-CM | POA: Diagnosis not present

## 2019-09-03 DIAGNOSIS — M6281 Muscle weakness (generalized): Secondary | ICD-10-CM | POA: Diagnosis not present

## 2019-09-03 DIAGNOSIS — M25631 Stiffness of right wrist, not elsewhere classified: Secondary | ICD-10-CM | POA: Diagnosis not present

## 2019-09-03 DIAGNOSIS — M25611 Stiffness of right shoulder, not elsewhere classified: Secondary | ICD-10-CM | POA: Diagnosis not present

## 2019-09-09 ENCOUNTER — Other Ambulatory Visit: Payer: Self-pay | Admitting: Internal Medicine

## 2019-09-10 DIAGNOSIS — M25631 Stiffness of right wrist, not elsewhere classified: Secondary | ICD-10-CM | POA: Diagnosis not present

## 2019-09-10 DIAGNOSIS — M25611 Stiffness of right shoulder, not elsewhere classified: Secondary | ICD-10-CM | POA: Diagnosis not present

## 2019-09-10 DIAGNOSIS — M6281 Muscle weakness (generalized): Secondary | ICD-10-CM | POA: Diagnosis not present

## 2019-09-16 NOTE — Progress Notes (Deleted)
FOLLOW UP  Assessment and Plan:   Hypertension Well controlled with current medications  Monitor blood pressure at home; patient to call if consistently greater than 130/80 Continue DASH diet.   Reminder to go to the ER if any CP, SOB, nausea, dizziness, severe HA, changes vision/speech, left arm numbness and tingling and jaw pain.  Hyperlipidemia associated with T2DM ([place]) Currently above goal on zetia; statin/welchol intolerant - *** Continue low cholesterol diet and exercise.  Check lipid panel.   Diabetes with diabetic chronic kidney disease ([place]) Continue medication: metformin Reduce metformin dose if GFR trending <45 Continue diet and exercise.  Perform daily foot/skin check, notify office of any concerning changes.  Check A1C  CKD III associated with T2DM (HCC) Increase fluids, avoid NSAIDS, monitor sugars, will monitor  Neuropathy, diabetic (HCC)  Gabapentin PRN, discussed feet checks  BMI 24  Long discussion about weight loss, diet, and exercise Recommended diet heavy in fruits and veggies and low in animal meats, cheeses, and dairy products, appropriate calorie intake Discussed ideal weight for height  Patient is currently working on lifestyle and doing well with weight loss Will follow up in 3 months  Vitamin D Def At goal at last visit; continue supplementation Defer Vit D level  Gout Continue allopurinol Diet discussed Check uric acid as needed ***   Continue diet and meds as discussed. Further disposition pending results of labs. Discussed med's effects and SE's.   Over 30 minutes of exam, counseling, chart review, and critical decision making was performed.   Future Appointments  Date Time Provider [place]  09/18/2019  8:45 AM Liane Comber, NP [facility] None  12/24/2019  9:30 AM Unk Pinto, MD [facility] None  07/06/2020 10:00 AM Unk Pinto, MD [facility] None     ----------------------------------------------------------------------------------------------------------------------  HPI 69 y.o. male  presents for 3 month follow up on hypertension, cholesterol, diabetes, weight, gout and vitamin D deficiency.   He recently in 06/2019 underwent ORIF for R radial/ulnar fracture by Dr. Doreatha Martin.   BMI is There is no height or weight on file to calculate BMI., he has been working on diet, doesn't exercise but walks extensively while at work at [employer].  Wt Readings from Last 3 Encounters:  07/18/19 165 lb (74.8 kg)  06/17/19 162 lb 3.2 oz (73.6 kg)  12/17/18 160 lb (72.6 kg)   His blood pressure has been controlled at home, today their BP is    He does not workout. He denies chest pain, shortness of breath, dizziness.   He is on cholesterol medication (zetia 10 mg and lopid 1200 mg *** daily, hx of statin, welchol intolerance) and denies myalgias. His cholesterol is not at goal. The cholesterol last visit was:   Lab Results  Component Value Date   CHOL 201 (H) 06/17/2019   HDL 41 06/17/2019   LDLCALC 130 (H) 06/17/2019   TRIG 167 (H) 06/17/2019   CHOLHDL 4.9 06/17/2019    He has been working on diet and exercise for T2DM with CKD and neuropathy, currently treated by metformin 500 mg x 4 tabs, and denies foot ulcerations, increased appetite, nausea, polydipsia, polyuria, visual disturbances, vomiting and weight loss. He has been checking sugars sporadically and reports values ranging 60-120 checking throughout the day. Denies hypoglycemia symptoms.  He takes gabapentin for neuropathic pains at night.  Last A1C in the office was:  Lab Results  Component Value Date   HGBA1C 6.1 (H) 06/17/2019    He has CKD III associated with T2DM monitored  at this office:  Lab Results  Component Value Date   GFRNONAA 46 (L) 07/18/2019   Patient is on Vitamin D supplement.   Lab Results  Component Value Date   VD25OH 74 06/17/2019     Patient is on  allopurinol for gout and does not report a recent flare.  Lab Results  Component Value Date   LABURIC 9.2 (H) 06/17/2019     Current Medications:  Current Outpatient Medications on File Prior to Visit  Medication Sig  . acetaminophen (TYLENOL) 500 MG tablet Take 1,000 mg by mouth every 6 (six) hours as needed for mild pain.  Marland Kitchen allopurinol (ZYLOPRIM) 300 MG tablet Take 1 tablet Daily to Prevent Gout (Patient taking differently: Take 300 mg by mouth daily. )  . BABY ASPIRIN PO Take 81 mg by mouth daily.  . Cholecalciferol (VITAMIN D PO) Take 5,000 Int'l Units by mouth daily.  . colchicine 0.6 MG tablet Take 1 to 2 tablets daily for gout attack and PRN. (Patient taking differently: Take 0.6 mg by mouth daily as needed (Gout). )  . desoximetasone (TOPICORT) 0.25 % cream Apply 1 application topically daily as needed (Skin rash).   . enalapril (VASOTEC) 20 MG tablet Take 1 tablet Daily for BP  . ezetimibe (ZETIA) 10 MG tablet Take 1 tablet Daily for Cholesterol (Patient taking differently: Take 10 mg by mouth daily. for Cholesterol)  . gabapentin (NEURONTIN) 100 MG capsule Take 1 to 2 capsules 2 x /day at Suppertime & Bedtime for Neuropathy Pain (Patient taking differently: Take 100-200 mg by mouth at bedtime. for Neuropathy Pain)  . gemfibrozil (LOPID) 600 MG tablet Take 1 tablet 2 x  /day with Meals for Cholesterol & Triglycerides (Patient not taking: Reported on 07/17/2019)  . glucose blood (FREESTYLE TEST STRIPS) test strip CHECK BLOOD SUGAR 1 TIME DAILY-DX-E11.22  . glucose monitoring kit (FREESTYLE) monitoring kit Check blood sugar 1 time daily-DX-E11.22.  . HYDROcodone-acetaminophen (NORCO) 7.5-325 MG tablet Take 1 tablet by mouth every 6 (six) hours as needed for severe pain.  . Lancets (FREESTYLE) lancets CHECK BLOOD SUGAR 1 TIME DAILY-DX-E11.22  . metFORMIN (GLUCOPHAGE-XR) 500 MG 24 hr tablet Take 2 tablets  2 x /day with Meals for Diabetes (Patient taking differently: Take 500 mg by mouth  2 (two) times daily. with Meals for Diabetes)  . promethazine (PHENERGAN) 12.5 MG tablet Take 1-2 tablets (12.5-25 mg total) by mouth every 6 (six) hours as needed for nausea or vomiting.   No current facility-administered medications on file prior to visit.     Allergies:  Allergies  Allergen Reactions  . Crestor [Rosuvastatin] Diarrhea  . Welchol [Colesevelam Hcl] Diarrhea  . Zoloft [Sertraline Hcl] Other (See Comments)    unknown  . Penicillins Other (See Comments)    Did it involve swelling of the face/tongue/throat, SOB, or low BP? Unknown Did it involve sudden or severe rash/hives, skin peeling, or any reaction on the inside of your mouth or nose? Unknown Did you need to seek medical attention at a hospital or doctor's office? Unknown When did it last happen?Childhood If all above answers are "NO", may proceed with cephalosporin use.     Medical History:  Past Medical History:  Diagnosis Date  . Allergy   . Bone spur    Left Shoulder  . 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 ([place]) 07/18/2017  . Fracture    right forearm  . Gout   . Hyperlipidemia   . Hypertension   .  Other testicular hypofunction   . PONV (postoperative nausea and vomiting)   . Type II or unspecified type diabetes mellitus without mention of complication, not stated as uncontrolled    Family history- Reviewed and unchanged Social history- Reviewed and unchanged   Review of Systems:  Review of Systems  Constitutional: Negative for malaise/fatigue and weight loss.  HENT: Negative for hearing loss and tinnitus.   Eyes: Negative for blurred vision and double vision.  Respiratory: Negative for cough, shortness of breath and wheezing.   Cardiovascular: Negative for chest pain, palpitations, orthopnea, claudication and leg swelling.  Gastrointestinal: Negative for abdominal pain, blood in stool, constipation, diarrhea, heartburn, melena, nausea and vomiting.  Genitourinary: Negative.    Musculoskeletal: Negative for joint pain and myalgias.  Skin: Negative for rash.  Neurological: Negative for dizziness, tingling, sensory change, weakness and headaches.  Endo/Heme/Allergies: Negative for polydipsia.  Psychiatric/Behavioral: Negative.   All other systems reviewed and are negative.    Physical Exam: There were no vitals taken for this visit. Wt Readings from Last 3 Encounters:  07/18/19 165 lb (74.8 kg)  06/17/19 162 lb 3.2 oz (73.6 kg)  12/17/18 160 lb (72.6 kg)   General Appearance: Well nourished, in no apparent distress. Eyes: PERRLA, EOMs, conjunctiva no swelling or erythema Sinuses: No Frontal/maxillary tenderness ENT/Mouth: Ext aud canals clear, TMs without erythema, bulging. No erythema, swelling, or exudate on post pharynx.  Tonsils not swollen or erythematous. Hearing normal.  Neck: Supple, thyroid normal.  Respiratory: Respiratory effort normal, BS equal bilaterally without rales, rhonchi, wheezing or stridor.  Cardio: RRR with no MRGs. Brisk peripheral pulses without edema.  Abdomen: Soft, + BS.  Non tender, no guarding, rebound, hernias, masses. Lymphatics: Non tender without lymphadenopathy.  Musculoskeletal: Full ROM, 5/5 strength, Normal gait Skin: Warm, dry without rashes, lesions, ecchymosis.  Neuro: Cranial nerves intact. No cerebellar symptoms.  Psych: Awake and oriented X 3, normal affect, Insight and Judgment appropriate.    Izora Ribas, NP 7:37 AM [facility]

## 2019-09-17 DIAGNOSIS — M25631 Stiffness of right wrist, not elsewhere classified: Secondary | ICD-10-CM | POA: Diagnosis not present

## 2019-09-17 DIAGNOSIS — M25611 Stiffness of right shoulder, not elsewhere classified: Secondary | ICD-10-CM | POA: Diagnosis not present

## 2019-09-17 DIAGNOSIS — S52201D Unspecified fracture of shaft of right ulna, subsequent encounter for closed fracture with routine healing: Secondary | ICD-10-CM | POA: Diagnosis not present

## 2019-09-17 DIAGNOSIS — M6281 Muscle weakness (generalized): Secondary | ICD-10-CM | POA: Diagnosis not present

## 2019-09-17 DIAGNOSIS — S52301D Unspecified fracture of shaft of right radius, subsequent encounter for closed fracture with routine healing: Secondary | ICD-10-CM | POA: Diagnosis not present

## 2019-09-18 ENCOUNTER — Ambulatory Visit: Payer: PPO | Admitting: Adult Health

## 2019-09-24 DIAGNOSIS — M25631 Stiffness of right wrist, not elsewhere classified: Secondary | ICD-10-CM | POA: Diagnosis not present

## 2019-09-24 DIAGNOSIS — M25611 Stiffness of right shoulder, not elsewhere classified: Secondary | ICD-10-CM | POA: Diagnosis not present

## 2019-09-24 DIAGNOSIS — M6281 Muscle weakness (generalized): Secondary | ICD-10-CM | POA: Diagnosis not present

## 2019-10-01 DIAGNOSIS — M6281 Muscle weakness (generalized): Secondary | ICD-10-CM | POA: Diagnosis not present

## 2019-10-01 DIAGNOSIS — M25611 Stiffness of right shoulder, not elsewhere classified: Secondary | ICD-10-CM | POA: Diagnosis not present

## 2019-10-01 DIAGNOSIS — M25631 Stiffness of right wrist, not elsewhere classified: Secondary | ICD-10-CM | POA: Diagnosis not present

## 2019-10-02 DIAGNOSIS — M25511 Pain in right shoulder: Secondary | ICD-10-CM | POA: Diagnosis not present

## 2019-10-08 DIAGNOSIS — M6281 Muscle weakness (generalized): Secondary | ICD-10-CM | POA: Diagnosis not present

## 2019-10-08 DIAGNOSIS — M25611 Stiffness of right shoulder, not elsewhere classified: Secondary | ICD-10-CM | POA: Diagnosis not present

## 2019-10-08 DIAGNOSIS — M25631 Stiffness of right wrist, not elsewhere classified: Secondary | ICD-10-CM | POA: Diagnosis not present

## 2019-10-14 ENCOUNTER — Telehealth: Payer: Self-pay | Admitting: *Deleted

## 2019-10-14 ENCOUNTER — Other Ambulatory Visit: Payer: Self-pay | Admitting: Internal Medicine

## 2019-10-14 DIAGNOSIS — E1140 Type 2 diabetes mellitus with diabetic neuropathy, unspecified: Secondary | ICD-10-CM

## 2019-10-14 MED ORDER — GABAPENTIN 300 MG PO CAPS: ORAL_CAPSULE | ORAL | 1 refills | Status: DC

## 2019-10-14 NOTE — Telephone Encounter (Signed)
Patient called in regard to his medication. He states he reduced his Metformin 500 mg ER dose to 1 tablet twice a day, due to weight loss. He states he checks his blood sugar and the level has been 100 to 130 range. He is taking Gabapentin 100 gm 1 tablet in the morning and 2 tablets in the evening, but still has burning his his feet.  Per Dr Melford Aase, the patient was advised he will send a new RX for a high dose of the Gabapentin.

## 2019-10-15 ENCOUNTER — Telehealth: Payer: Self-pay | Admitting: *Deleted

## 2019-10-15 DIAGNOSIS — M25611 Stiffness of right shoulder, not elsewhere classified: Secondary | ICD-10-CM | POA: Diagnosis not present

## 2019-10-15 DIAGNOSIS — M6281 Muscle weakness (generalized): Secondary | ICD-10-CM | POA: Diagnosis not present

## 2019-10-15 DIAGNOSIS — M25631 Stiffness of right wrist, not elsewhere classified: Secondary | ICD-10-CM | POA: Diagnosis not present

## 2019-10-15 NOTE — Telephone Encounter (Signed)
Returned a call to [place] from [facility], in regard to the patient's Metformin dose. Informed Vicente Males that Dr Melford Aase is aware the patient has reduced his Metformin 500 mg to 1 tablet twice a day, due to weight loss.

## 2019-10-29 DIAGNOSIS — S5291XD Unspecified fracture of right forearm, subsequent encounter for closed fracture with routine healing: Secondary | ICD-10-CM | POA: Diagnosis not present

## 2019-10-29 DIAGNOSIS — S52301D Unspecified fracture of shaft of right radius, subsequent encounter for closed fracture with routine healing: Secondary | ICD-10-CM | POA: Diagnosis not present

## 2019-11-02 DIAGNOSIS — M25511 Pain in right shoulder: Secondary | ICD-10-CM | POA: Diagnosis not present

## 2019-12-12 ENCOUNTER — Other Ambulatory Visit: Payer: Self-pay | Admitting: Internal Medicine

## 2019-12-19 DIAGNOSIS — G720 Drug-induced myopathy: Secondary | ICD-10-CM | POA: Insufficient documentation

## 2019-12-19 NOTE — Progress Notes (Deleted)
MEDICARE ANNUAL WELLNESS VISIT AND FOLLOW UP Assessment:    Encounter for Medicare annual wellness exam  Essential hypertension At goal; continue medications Monitor blood pressure at home; call if consistently over 130/80 Continue DASH diet.   Reminder to go to the ER if any CP, SOB, nausea, dizziness, severe HA, changes vision/speech, left arm numbness and tingling and jaw pain.  Type 2 diabetes mellitus with stage 3 chronic kidney disease, without long-term current use of insulin (HCC) Type 2 Diabetes Mellitus Education: Reviewed 'ABCs' of diabetes management (respective goals in parentheses):  A1C (<7), blood pressure (<130/80), and cholesterol (LDL <70) Eye Exam yearly and Dental Exam every 6 months- he states he will schedule eye exam- will get from Dr. Delman Stephenson Dietary recommendations Physical Activity recommendations --     BASIC METABOLIC PANEL WITH GFR -     Hemoglobin A1c  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 (HCC) Increase fluids, avoid NSAIDS, monitor sugars, will monitor -     BASIC METABOLIC PANEL WITH GFR  Mixed hyperlipidemia Fairly controlled on zetia; has been intolerant of several statins Continue low cholesterol diet and exercise.  -     Lipid panel -     TSH  Testosterone deficiency Has opted against supplementation Recommended weight loss, zinc 50 mg daily supplement, high-intensity interval exercises  Idiopathic gout, unspecified chronicity, unspecified site Continue allopurinol Diet discussed Check uric acid as needed - at goal at recent check  Vitamin D deficiency Below goal at last check; he has not  increased dose; goal is 70-100 He will increase dose this visit -  Defer vitamin D check until next visit  Medication management -     CBC with Differential/Platelet -     BASIC METABOLIC PANEL WITH GFR -     Hepatic function panel  Diabetic polyneuropathy associated with type 2 diabetes mellitus ([place]) Continue gabapentin, will send in  358m for him when he runs out of 1063mContinue weight loss  Statin intolerance Continue zetia and weight loss  Over 30 minutes of exam, counseling, chart review, and critical decision making was performed  Future Appointments  Date Time Provider De[place]7/11/2019  9:30 AM CoVicie MuttersPA-C [facility] None  07/06/2020 10:00 AM McUnk PintoMD [facility] None     Plan:   During the course of the visit the patient was educated and counseled about appropriate screening and preventive services including:    Pneumococcal vaccine   Influenza vaccine  Prevnar 13  Td vaccine  Screening electrocardiogram  Colorectal cancer screening  Diabetes screening  Glaucoma screening  Nutrition counseling    Subjective:  DoGarritt Stephenson a [age].o. male who presents for Medicare Annual Wellness Visit and 3 month follow up for HTN, hyperlipidemia, T2DM with CKD 3, and vitamin D Def.   Had recent positive cologuard, had colonoscopy 10/2017, recall 3 years.  Right handed, had left shoulder surgery with Dr. DaRhona Raiderstates ROM is better but still with tingling in neck. Has not had Xray of his neck.   BMI is There is no height or weight on file to calculate BMI., he has been working on diet- avoiding sweets and soda. Increased water, continues to exercise.  Wt Readings from Last 3 Encounters:  07/18/19 165 lb (74.8 kg)  06/17/19 162 lb 3.2 oz (73.6 kg)  12/17/18 160 lb (72.6 kg)   His blood pressure has been controlled at home, today their BP is  . BP is better with 1/2  of enalapril.  He does not workout - but walks extensively at home and fishes on his days off.  He denies chest pain, shortness of breath, dizziness.   He is on cholesterol medication (zetia 10 mg daily - intolerant of several statins) and denies myalgias. His cholesterol is not at goal. The cholesterol last visit was:   Lab Results  Component Value Date   CHOL 201 (H) 06/17/2019   HDL 41 06/17/2019    LDLCALC 130 (H) 06/17/2019   TRIG 167 (H) 06/17/2019   CHOLHDL 4.9 06/17/2019   He has been working on diet and exercise for T2 diabetes With hyperlipdiemia- has statin myopathy With CKD With neuropathy on gabapentin  he is on 4 a day of metformin and denies increased appetite, nausea, paresthesia of the feet, polydipsia, polyuria, visual disturbances, vomiting and weight loss. Last A1C in the office was:  Lab Results  Component Value Date   HGBA1C 6.1 (H) 06/17/2019   Last GFR Lab Results  Component Value Date   GFRNONAA 46 (L) 07/18/2019    Patient is on Vitamin D supplement but remained below goal at most recent check:    Lab Results  Component Value Date   VD25OH 74 06/17/2019     Patient is on allopurinol for gout and does not report a recent flare.  Lab Results  Component Value Date   LABURIC 9.2 (H) 06/17/2019     Medication Review:  Current Outpatient Medications (Endocrine & Metabolic):  .  metFORMIN (GLUCOPHAGE-XR) 500 MG 24 hr tablet, Take 2 tablets  2 x /day with Meals for Diabetes (Patient taking differently: Take 500 mg by mouth 2 (two) times daily. with Meals for Diabetes)  Current Outpatient Medications (Cardiovascular):  .  enalapril (VASOTEC) 20 MG tablet, Take 1 tablet Daily for  BP .  ezetimibe (ZETIA) 10 MG tablet, Take 1 tablet Daily for Cholesterol (Patient taking differently: Take 10 mg by mouth daily. for Cholesterol) .  gemfibrozil (LOPID) 600 MG tablet, Take 1 tablet 2 x  /day with Meals for Cholesterol & Triglycerides (Patient not taking: Reported on 07/17/2019)  Current Outpatient Medications (Respiratory):  .  promethazine (PHENERGAN) 12.5 MG tablet, Take 1-2 tablets (12.5-25 mg total) by mouth every 6 (six) hours as needed for nausea or vomiting.  Current Outpatient Medications (Analgesics):  .  acetaminophen (TYLENOL) 500 MG tablet, Take 1,000 mg by mouth every 6 (six) hours as needed for mild pain. Marland Kitchen  allopurinol (ZYLOPRIM) 300 MG tablet,  Take 1 tablet Daily to Prevent Gout (Patient taking differently: Take 300 mg by mouth daily. ) .  BABY ASPIRIN PO, Take 81 mg by mouth daily. .  colchicine 0.6 MG tablet, Take 1 to 2 tablets daily for gout attack and PRN. (Patient taking differently: Take 0.6 mg by mouth daily as needed (Gout). ) .  HYDROcodone-acetaminophen (NORCO) 7.5-325 MG tablet, Take 1 tablet by mouth every 6 (six) hours as needed for severe pain.   Current Outpatient Medications (Other):  Marland Kitchen  Cholecalciferol (VITAMIN D PO), Take 5,000 Int'l Units by mouth daily. Marland Kitchen  desoximetasone (TOPICORT) 0.25 % cream, Apply 1 application topically daily as needed (Skin rash).  .  gabapentin (NEURONTIN) 300 MG capsule, Take 1 capsule 3 x /day for Diabetic Neuropathy .  glucose blood (FREESTYLE TEST STRIPS) test strip, CHECK BLOOD SUGAR 1 TIME DAILY-DX-E11.22 .  glucose monitoring kit (FREESTYLE) monitoring kit, Check blood sugar 1 time daily-DX-E11.22. Marland Kitchen  Lancets (FREESTYLE) lancets, CHECK BLOOD SUGAR 1 TIME DAILY-DX-E11.22  Allergies: Allergies  Allergen Reactions  . Crestor [Rosuvastatin] Diarrhea  . Welchol [Colesevelam Hcl] Diarrhea  . Zoloft [Sertraline Hcl] Other (See Comments)    unknown  . Penicillins Other (See Comments)    Did it involve swelling of the face/tongue/throat, SOB, or low BP? Unknown Did it involve sudden or severe rash/hives, skin peeling, or any reaction on the inside of your mouth or nose? Unknown Did you need to seek medical attention at a hospital or doctor's office? Unknown When did it last happen?Childhood If all above answers are "NO", may proceed with cephalosporin use.    Current Problems (verified) has Hyperlipidemia associated with type 2 diabetes mellitus ([place]); Hypertension; Type 2 diabetes mellitus with stage 3 chronic kidney disease ([place]); Testosterone deficiency; Gout; Vitamin D deficiency; Medication management; 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 ([place]); Diabetes,  polyneuropathy ([place]); Statin intolerance; and Closed fracture of middle of right radius and ulna on their problem list.  Screening Tests Immunization History  Administered Date(s) Administered  . PPD Test 02/07/2014, 02/16/2015  . Pneumococcal Conjugate-13 04/12/2017  . Pneumococcal Polysaccharide-23 11/01/2010, 05/29/2018  . Td 12/25/2012  . Zoster 12/25/2012   Preventative care: Last colonoscopy: 10/2017, due 3 years + adenoma  cologuard + sent for colonoscopy   Prior vaccinations: TD or Tdap: 2014   Influenza: Refuses -  Pneumococcal: 2012 Prevnar13: 2018 Shingles/Zostavax: 2014  Names of Other Physician/Practitioners you currently use: 1. [facility] here for primary care 2. Dr. Herbert Deaner, eye doctor, last visit 2019 3. Dr. Donnal Debar, dentist, last visit 2018  Patient Care Team: Unk Pinto, MD as PCP - General (Internal Medicine)  Surgical: He  has a past surgical history that includes Wrist surgery (Right, 1998); Prostate biopsy (2010); Cyst removal hand; and ORIF radial fracture (Right, 07/18/2019). Family His family history includes Heart disease in his father; Heart disease (age of onset: 77) in his mother; Heart failure in his father; Kidney disease in his father. Social history  He reports that he has never smoked. He has never used smokeless tobacco. He reports previous alcohol use. He reports that he does not use drugs.  MEDICARE WELLNESS OBJECTIVES: Physical activity:   Cardiac risk factors:   Depression/mood screen:   Depression screen [facility] 2/9 06/16/2019  Decreased Interest 0  Down, Depressed, Hopeless 0  PHQ - 2 Score 0    ADLs:  In your present state of health, do you have any difficulty performing the following activities: 07/18/2019 06/16/2019  Hearing? N N  Vision? N N  Difficulty concentrating or making decisions? N N  Walking or climbing stairs? N N  Dressing or bathing? N N  Doing errands, shopping? - N   Some recent data might be hidden     Cognitive Testing  Alert? Yes  Normal Appearance?Yes  Oriented to person? Yes  Place? Yes   Time? Yes  Recall of three objects?  Yes  Can perform simple calculations? Yes  Displays appropriate judgment?Yes  Can read the correct time from a watch face?Yes  EOL planning:    Review of Systems  Constitutional: Negative for malaise/fatigue and weight loss.  HENT: Negative for hearing loss and tinnitus.   Eyes: Negative for blurred vision and double vision.  Respiratory: Negative for cough, shortness of breath and wheezing.   Cardiovascular: Negative for chest pain, palpitations, orthopnea, claudication and leg swelling.  Gastrointestinal: Negative for abdominal pain, blood in stool, constipation, diarrhea, heartburn, melena, nausea and vomiting.  Genitourinary: Negative.  Musculoskeletal: Negative for joint pain and myalgias.  Skin: Negative for rash.  Neurological: Negative for dizziness, tingling, sensory change, weakness and headaches.  Endo/Heme/Allergies: Negative for polydipsia.  Psychiatric/Behavioral: Negative.   All other systems reviewed and are negative.    Objective:   There were no vitals filed for this visit. There is no height or weight on file to calculate BMI.  General appearance: alert, no distress, WD/WN, male HEENT: normocephalic, sclerae anicteric, TMs pearly, nares patent, no discharge or erythema, pharynx normal Oral cavity: MMM, no lesions Neck: supple, no lymphadenopathy, no thyromegaly, no masses Heart: RRR, normal S1, S2, no murmurs Lungs: CTA bilaterally, no wheezes, rhonchi, or rales Abdomen: +bs, soft, non tender, non distended, no masses, no hepatomegaly, no splenomegaly Musculoskeletal: nontender, no swelling, no obvious deformity Extremities: no edema, no cyanosis, no clubbing Pulses: 2+ symmetric, upper and lower extremities, normal cap refill Neurological: alert, oriented x 3, CN2-12 intact, strength  normal upper extremities and lower extremities, sensation normal throughout, DTRs 2+ throughout, no cerebellar signs, gait normal Psychiatric: normal affect, behavior normal, pleasant   Medicare Attestation I have personally reviewed: The patient's medical and social history Their use of alcohol, tobacco or illicit drugs Their current medications and supplements The patient's functional ability including ADLs,fall risks, home safety risks, cognitive, and hearing and visual impairment Diet and physical activities Evidence for depression or mood disorders  The patient's weight, height, BMI, and visual acuity have been recorded in the chart.  I have made referrals, counseling, and provided education to the patient based on review of the above and I have provided the patient with a written personalized care plan for preventive services.     Vicie Mutters, PA-C   12/19/2019

## 2019-12-24 ENCOUNTER — Ambulatory Visit: Payer: PPO | Admitting: Physician Assistant

## 2020-01-07 NOTE — Progress Notes (Signed)
MEDICARE ANNUAL WELLNESS VISIT AND FOLLOW UP Assessment:    Encounter for Medicare annual wellness exam Dicussed COVID vaccines  Essential hypertension At goal; CUT ENALAPRIL IN HALF DUE TO HYPOTENSION- MONITOR BP AT HOME Monitor blood pressure at home; call if consistently over 130/80 Continue DASH diet.   Reminder to go to the ER if any CP, SOB, nausea, dizziness, severe HA, changes vision/speech, left arm numbness and tingling and jaw pain.  Type 2 diabetes mellitus with stage 3 chronic kidney disease, without long-term current use of insulin (HCC) Type 2 Diabetes Mellitus Education: Reviewed 'ABCs' of diabetes management (respective goals in parentheses):  A1C (<7), blood pressure (<130/80), and cholesterol (LDL <70) Eye Exam yearly and Dental Exam every 6 months Dietary recommendations PATIENT ONLY ON 2 METFORMIN A DAY WITH WEIGHT LOSS Physical Activity recommendations --     BASIC METABOLIC PANEL WITH GFR -     Hemoglobin A1c  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 (HCC) Increase fluids, avoid NSAIDS, monitor sugars, will monitor -     BASIC METABOLIC PANEL WITH GFR  Mixed hyperlipidemia Fairly controlled on zetia; has been intolerant of several statins May benefit from lipid clinic referral in the future Continue low cholesterol diet and exercise.  -     Lipid panel -     TSH  Testosterone deficiency Has opted against supplementation Recommended weight loss, zinc 50 mg daily supplement, high-intensity interval exercises  Idiopathic gout, unspecified chronicity, unspecified site Continue allopurinol Diet discussed Check uric acid as needed - at goal at recent check  Vitamin D deficiency Below goal at last check; he has not  increased dose; goal is 70-100 He will increase dose this visit -  Defer vitamin D check until next visit  Medication management -     CBC with Differential/Platelet -     BASIC METABOLIC PANEL WITH GFR -     Hepatic function  panel  Diabetic polyneuropathy associated with type 2 diabetes mellitus ([place]) Continue gabapentin Continue weight loss  Statin MYOPATHY Continue zetia and weight loss  Over 30 minutes of exam, counseling, chart review, and critical decision making was performed  Future Appointments  Date Time Provider [place]  07/06/2020 10:00 AM Unk Pinto, MD [facility] None     Plan:   During the course of the visit the patient was educated and counseled about appropriate screening and preventive services including:    Pneumococcal vaccine   Influenza vaccine  Prevnar 13  Td vaccine  Screening electrocardiogram  Colorectal cancer screening  Diabetes screening  Glaucoma screening  Nutrition counseling    Subjective:  [name] is a 69 y.o. male who presents for Medicare Annual Wellness Visit and 3 month follow up for HTN, hyperlipidemia, T2DM with CKD 3, and vitamin D Def.   Had recent positive cologuard, had colonoscopy 10/2017, recall 3 years.   Right handed, had left shoulder surgery with Dr. Rhona Raider, states ROM is better but still with tingling in neck. Has not had Xray of his neck. Had a fall in Jan resulting in open reduction internal fixation of radius and ulna on right side. He also had right shoulder pain, had rotator cuff tear, had injection and he is doing better.   BMI is Body mass index is 24.51 kg/m., he has been working on diet- avoiding sweets and soda. Increased water, continues to exercise.  Wt Readings from Last 3 Encounters:  01/08/20 166 lb (75.3 kg)  07/18/19 165 lb (74.8 kg)  06/17/19 162 lb 3.2 oz (73.6 kg)   His blood pressure has been controlled at home, today their BP is BP: 110/80. BUT HE STATES IT HAS BEEN AS LOW AS 80 AT HOME. WILL EAT DILL PICKLE AND DRINK MORE FLUIDS AND WILL INCREASE. HE IS ON WHOLE ENALAPRIL.    He does not workout - but walks extensively at home and fishes on his days off.  He denies chest pain, shortness  of breath, dizziness.   He is on cholesterol medication (zetia 10 mg daily - intolerant of several statins) and denies myalgias. His cholesterol is not at goal. The cholesterol last visit was:   Lab Results  Component Value Date   CHOL 208 (H) 01/08/2020   HDL 37 (L) 01/08/2020   LDLCALC 120 (H) 01/08/2020   TRIG 355 (H) 01/08/2020   CHOLHDL 5.6 (H) 01/08/2020   He has been working on diet and exercise for T2 diabetes With hyperlipdiemia- has statin myopathy With CKD With neuropathy on gabapentin HE DECREASED TO 1 IN THE AM AND 1 NIGHT and denies increased appetite, nausea, paresthesia of the feet, polydipsia, polyuria, visual disturbances, vomiting and weight loss Last A1C in the office was:  Lab Results  Component Value Date   HGBA1C 6.2 (H) 01/08/2020   Last GFR Lab Results  Component Value Date   GFRNONAA 38 (L) 01/08/2020    Patient is on Vitamin D supplement but remained below goal at most recent check:    Lab Results  Component Value Date   VD25OH 74 06/17/2019     Patient is on allopurinol for gout and does not report a recent flare.  Lab Results  Component Value Date   LABURIC 9.2 (H) 06/17/2019     Medication Review:  Current Outpatient Medications (Endocrine & Metabolic):  .  metFORMIN (GLUCOPHAGE-XR) 500 MG 24 hr tablet, Take 1 tablet (500 mg total) by mouth 2 (two) times daily. with Meals for Diabetes  Current Outpatient Medications (Cardiovascular):  .  enalapril (VASOTEC) 20 MG tablet, Take 1/2 tablet Daily for  BP .  ezetimibe (ZETIA) 10 MG tablet, Take 1 tablet Daily for Cholesterol (Patient taking differently: Take 10 mg by mouth daily. for Cholesterol) .  gemfibrozil (LOPID) 600 MG tablet, Take 1 tablet 2 x  /day with Meals for Cholesterol & Triglycerides  Current Outpatient Medications (Respiratory):  .  promethazine (PHENERGAN) 12.5 MG tablet, Take 1-2 tablets (12.5-25 mg total) by mouth every 6 (six) hours as needed for nausea or  vomiting.  Current Outpatient Medications (Analgesics):  .  acetaminophen (TYLENOL) 500 MG tablet, Take 1,000 mg by mouth every 6 (six) hours as needed for mild pain. Marland Kitchen  allopurinol (ZYLOPRIM) 300 MG tablet, Take 1 tablet Daily to Prevent Gout (Patient taking differently: Take 300 mg by mouth daily. ) .  BABY ASPIRIN PO, Take 81 mg by mouth daily. .  colchicine 0.6 MG tablet, Take 1 to 2 tablets daily for gout attack and PRN. (Patient taking differently: Take 0.6 mg by mouth daily as needed (Gout). )   Current Outpatient Medications (Other):  Marland Kitchen  Cholecalciferol (VITAMIN D PO), Take 5,000 Int'l Units by mouth daily. Marland Kitchen  desoximetasone (TOPICORT) 0.25 % cream, Apply 1 application topically daily as needed (Skin rash).  .  gabapentin (NEURONTIN) 300 MG capsule, Take 1 capsule 3 x /day for Diabetic Neuropathy .  glucose blood (FREESTYLE TEST STRIPS) test strip, CHECK BLOOD SUGAR 1 TIME DAILY-DX-E11.22 .  glucose monitoring kit (FREESTYLE) monitoring kit, Check blood  sugar 1 time daily-DX-E11.22. Marland Kitchen  Lancets (FREESTYLE) lancets, CHECK BLOOD SUGAR 1 TIME DAILY-DX-E11.22  Allergies: Allergies  Allergen Reactions  . Crestor [Rosuvastatin] Diarrhea  . Welchol [Colesevelam Hcl] Diarrhea  . Zoloft [Sertraline Hcl] Other (See Comments)    unknown  . Penicillins Other (See Comments)    Did it involve swelling of the face/tongue/throat, SOB, or low BP? Unknown Did it involve sudden or severe rash/hives, skin peeling, or any reaction on the inside of your mouth or nose? Unknown Did you need to seek medical attention at a hospital or doctor's office? Unknown When did it last happen?Childhood If all above answers are "NO", may proceed with cephalosporin use.    Current Problems (verified) has Hyperlipidemia associated with type 2 diabetes mellitus ([place]); Hypertension; Type 2 diabetes mellitus with stage 3 chronic kidney disease ([place]); Testosterone deficiency; Gout; Vitamin D deficiency;  Medication management; 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 ([place]); Diabetes, polyneuropathy ([place]); Statin intolerance; Closed fracture of middle of right radius and ulna; Statin myopathy; and Senile purpura ([place]) on their problem list.  Screening Tests Immunization History  Administered Date(s) Administered  . PPD Test 02/07/2014, 02/16/2015  . Pneumococcal Conjugate-13 04/12/2017  . Pneumococcal Polysaccharide-23 11/01/2010, 05/29/2018  . Td 12/25/2012  . Zoster 12/25/2012   Health Maintenance  Topic Date Due  . COVID-19 Vaccine (1) Never done  . OPHTHALMOLOGY EXAM  07/21/2018  . INFLUENZA VACCINE  01/19/2020  . HEMOGLOBIN A1C  07/10/2020  . COLONOSCOPY  11/07/2020  . FOOT EXAM  01/07/2021  . TETANUS/TDAP  12/26/2022  . Hepatitis C Screening  Completed  . PNA vac Low Risk Adult  Completed    Preventative care: Last colonoscopy: 10/2017, due 3 years + adenoma  cologuard + sent for colonoscopy  Names of Other Physician/Practitioners you currently use: 1. [facility] here for primary care 2. Dr. Herbert Deaner, eye doctor, last visit 2019- OVERDUE 3. Dr. Donnal Debar, dentist, last visit 2018  Patient Care Team: Unk Pinto, MD as PCP - General (Internal Medicine)  Surgical: He  has a past surgical history that includes Wrist surgery (Right, 1998); Prostate biopsy (2010); Cyst removal hand; and ORIF radial fracture (Right, 07/18/2019). Family His family history includes Heart disease in his father; Heart disease (age of onset: 10) in his mother; Heart failure in his father; Kidney disease in his father. Social history  He reports that he has never smoked. He has never used smokeless tobacco. He reports previous alcohol use. He reports that he does not use drugs.  MEDICARE WELLNESS OBJECTIVES: Physical activity: Current Exercise Habits: Home exercise routine, Type of exercise: walking;Other - see comments, Intensity: Moderate Cardiac risk  factors: Cardiac Risk Factors include: advanced age (>68mn, >>38women);diabetes mellitus;hypertension;male gender;sedentary lifestyle;dyslipidemia Depression/mood screen:   [facility]  Decreased Interest 0  Down, Depressed, Hopeless 0  PHQ - 2 Score 0    ADLs:  In your present state of health, do you have any difficulty performing the following activities: 01/08/2020 07/18/2019  Hearing? N N  Vision? N N  Difficulty concentrating or making decisions? Y N  Comment SHORT TERM PER PATIENT -  Walking or climbing stairs? N N  Dressing or bathing? N N  Doing errands, shopping? N -  Some recent data might be hidden     Cognitive Testing  Alert? Yes  Normal Appearance?Yes  Oriented to person? Yes  Place? Yes   Time? Yes  Recall of three objects?  Yes  Can perform simple calculations? Yes  Displays appropriate judgment?Yes  Can read the correct time from a watch face?Yes  EOL planning: Does Patient Have a Medical Advance Directive?: No Would patient like information on creating a medical advance directive?: No - Patient declined (WANTS TO GO TO LAWYER AND GET IT)  Review of Systems  Constitutional: Negative for malaise/fatigue and weight loss.  HENT: Negative for hearing loss and tinnitus.   Eyes: Negative for blurred vision and double vision.  Respiratory: Negative for cough, shortness of breath and wheezing.   Cardiovascular: Negative for chest pain, palpitations, orthopnea, claudication and leg swelling.  Gastrointestinal: Negative for abdominal pain, blood in stool, constipation, diarrhea, heartburn, melena, nausea and vomiting.  Genitourinary: Negative.   Musculoskeletal: Negative for joint pain and myalgias.  Skin: Negative for rash.  Neurological: Negative for dizziness, tingling, sensory change, weakness and headaches.  Endo/Heme/Allergies: Negative for polydipsia.  Psychiatric/Behavioral: Negative.   All other systems reviewed and are  negative.    Objective:   Today's Vitals   01/08/20 1502  BP: 110/80  Pulse: 66  Temp: 98.3 F (36.8 C)  SpO2: 97%  Weight: 166 lb (75.3 kg)  Height: '5\' 9"'  (1.753 m)   Body mass index is 24.51 kg/m.  General appearance: alert, no distress, WD/WN, male HEENT: normocephalic, sclerae anicteric, TMs pearly, nares patent, no discharge or erythema, pharynx normal Oral cavity: MMM, no lesions Neck: supple, no lymphadenopathy, no thyromegaly, no masses Heart: RRR, normal S1, S2, no murmurs Lungs: CTA bilaterally, no wheezes, rhonchi, or rales Abdomen: +bs, soft, non tender, non distended, no masses, no hepatomegaly, no splenomegaly Musculoskeletal: nontender, no swelling, no obvious deformity Extremities: no edema, no cyanosis, no clubbing Pulses: 2+ symmetric, upper and lower extremities, normal cap refill Neurological: alert, oriented x 3, CN2-12 intact, strength normal upper extremities and lower extremities, sensation normal throughout, DTRs 2+ throughout, no cerebellar signs, gait normal Psychiatric: normal affect, behavior normal, pleasant   Medicare Attestation I have personally reviewed: The patient's medical and social history Their use of alcohol, tobacco or illicit drugs Their current medications and supplements The patient's functional ability including ADLs,fall risks, home safety risks, cognitive, and hearing and visual impairment Diet and physical activities Evidence for depression or mood disorders  The patient's weight, height, BMI, and visual acuity have been recorded in the chart.  I have made referrals, counseling, and provided education to the patient based on review of the above and I have provided the patient with a written personalized care plan for preventive services.     Vicie Mutters, PA-C   01/12/2020

## 2020-01-08 ENCOUNTER — Other Ambulatory Visit: Payer: Self-pay

## 2020-01-08 ENCOUNTER — Ambulatory Visit (INDEPENDENT_AMBULATORY_CARE_PROVIDER_SITE_OTHER): Payer: PPO | Admitting: Physician Assistant

## 2020-01-08 ENCOUNTER — Encounter: Payer: Self-pay | Admitting: Physician Assistant

## 2020-01-08 VITALS — BP 110/80 | HR 66 | Temp 98.3°F | Ht 69.0 in | Wt 166.0 lb

## 2020-01-08 DIAGNOSIS — D692 Other nonthrombocytopenic purpura: Secondary | ICD-10-CM | POA: Insufficient documentation

## 2020-01-08 DIAGNOSIS — E1122 Type 2 diabetes mellitus with diabetic chronic kidney disease: Secondary | ICD-10-CM

## 2020-01-08 DIAGNOSIS — E1121 Type 2 diabetes mellitus with diabetic nephropathy: Secondary | ICD-10-CM | POA: Diagnosis not present

## 2020-01-08 DIAGNOSIS — I10 Essential (primary) hypertension: Secondary | ICD-10-CM

## 2020-01-08 DIAGNOSIS — Z0001 Encounter for general adult medical examination with abnormal findings: Secondary | ICD-10-CM | POA: Diagnosis not present

## 2020-01-08 DIAGNOSIS — R6889 Other general symptoms and signs: Secondary | ICD-10-CM

## 2020-01-08 DIAGNOSIS — E1169 Type 2 diabetes mellitus with other specified complication: Secondary | ICD-10-CM | POA: Diagnosis not present

## 2020-01-08 DIAGNOSIS — G720 Drug-induced myopathy: Secondary | ICD-10-CM | POA: Diagnosis not present

## 2020-01-08 DIAGNOSIS — E785 Hyperlipidemia, unspecified: Secondary | ICD-10-CM | POA: Diagnosis not present

## 2020-01-08 DIAGNOSIS — Z79899 Other long term (current) drug therapy: Secondary | ICD-10-CM

## 2020-01-08 DIAGNOSIS — Z0000 Encounter for general adult medical examination without abnormal findings: Secondary | ICD-10-CM

## 2020-01-08 DIAGNOSIS — E1142 Type 2 diabetes mellitus with diabetic polyneuropathy: Secondary | ICD-10-CM | POA: Diagnosis not present

## 2020-01-08 DIAGNOSIS — E349 Endocrine disorder, unspecified: Secondary | ICD-10-CM

## 2020-01-08 DIAGNOSIS — N183 Chronic kidney disease, stage 3 unspecified: Secondary | ICD-10-CM | POA: Diagnosis not present

## 2020-01-08 DIAGNOSIS — M1000 Idiopathic gout, unspecified site: Secondary | ICD-10-CM | POA: Diagnosis not present

## 2020-01-08 DIAGNOSIS — E559 Vitamin D deficiency, unspecified: Secondary | ICD-10-CM

## 2020-01-08 DIAGNOSIS — N1831 Chronic kidney disease, stage 3a: Secondary | ICD-10-CM | POA: Diagnosis not present

## 2020-01-08 MED ORDER — ENALAPRIL MALEATE 20 MG PO TABS: ORAL_TABLET | ORAL | 0 refills | Status: DC

## 2020-01-08 MED ORDER — METFORMIN HCL ER 500 MG PO TB24: 500.0000 mg | ORAL_TABLET | Freq: Two times a day (BID) | ORAL | 3 refills | Status: DC

## 2020-01-08 NOTE — Patient Instructions (Addendum)
TAKE THE ENALAPRIL 1/2 DAILY OR 10MG  DAILY AND MONITOR BP  GO SEE YOUR EYE DOCTOR- SEND [place] THE REPORT  Can call DENTIST Dr. Toy Cookey [phone] Dr. Clearence Ped  [phone]  Here is some information about the vaccines. The data is very good and this information hopefully answers a lot of your questions and give you a confidence boost.   The Pfizer and Moderna vaccines are messenger RNA vaccines. That technology is not new, it has been studied for 20 years at least, used for cancer and MS treatment. They had started using the vaccine for MERS AND SARS (both a different coronavirus) 10 years ago but never finished so we had a good backbone for this vaccine.   There were no short cuts with the techniques for these clinical  trials, just lots of willing participants quickly and lots of up front money helped speed up the process.   The mRNA is very fragile which is why it needs to be kept so cold and thawed a certain way, think of it as a message in a glass bottle. NO PART of the virus is in this vaccine, it is a clip of the genetic sequence. This mRNA is injected in your arm, connects with a ribosome, delivers the message and the degrades.  That is part of the cause of the sore arm, the mRNA never leaves your arm. It degrades there. The mRNA does not go into our nucleotide where our DNA is, and we would need a DNA reverse transcriptase to take RNA to DNA, we do not have this, it can not change our DNA. The ribosome that got the message creates a protein and that protein circulates in our body and we have an immune reaction to that creating antibodies. Any time our immune system is triggered, inflammation is triggered too so you can have a temp, muscle aches, etc. Normal reaction.  I have seen so many patients that just had mild COVID in the office weeks later still have issues. We are still learning about post COVID syndrome, the CDC should be coming out for guidelines for practioners soon. There  is too much unknown about COVID. We have been using vaccines for over 100 years or more, i Personnel officer. Ask your parents or any older friends about polio and that vaccine, that was a disease shutting down schools,had kids in iron lung, devastating young kids and families. People lined up for that vaccine and technology has only improved.   Please get the vaccine. If you have any further questions please make an appointment in the office to discuss further.  [name]  General eating tips  What to Avoid . Avoid added sugars o Often added sugar can be found in processed foods such as many condiments, dry cereals, cakes, cookies, chips, crisps, crackers, candies, sweetened drinks, etc.  o Read labels and AVOID/DECREASE use of foods with the following in their ingredient list: Sugar, fructose, high fructose corn syrup, sucrose, glucose, maltose, dextrose, molasses, cane sugar, brown sugar, any type of syrup, agave nectar, etc.   . Avoid snacking in between meals- drink water or if you feel you need a snack, pick a high water content snack such as cucumbers, watermelon, or any veggie.  Marland Kitchen Avoid foods made with flour o If you are going to eat food made with flour, choose those made with whole-grains; and, minimize your consumption as much as is tolerable . Avoid processed foods o These foods are generally stocked  in the middle of the grocery store.  o Focus on shopping on the perimeter of the grocery.  What to Include . Vegetables o GREEN LEAFY VEGETABLES: Kale, spinach, mustard greens, collard greens, cabbage, broccoli, etc. o OTHER: Asparagus, cauliflower, eggplant, carrots, peas, Brussel sprouts, tomatoes, bell peppers, zucchini, beets, cucumbers, etc. . Grains, seeds, and legumes o Beans: kidney beans, black eyed peas, garbanzo beans, black beans, pinto beans, etc. o Whole, unrefined grains: brown rice, barley, bulgur, oatmeal, etc. . Healthy fats  o Avoid highly processed fats such as vegetable  oil o Examples of healthy fats: avocado, olives, virgin olive oil, dark chocolate (?72% Cocoa), nuts (peanuts, almonds, walnuts, cashews, pecans, etc.) o Please still do small amount of these healthy fats, they are dense in calories.  . Low - Moderate Intake of Animal Sources of Protein o Meat sources: chicken, [place], salmon, tuna. Limit to 4 ounces of meat at one time or the size of your palm. o Consider limiting dairy sources, but when choosing dairy focus on: PLAIN [place] yogurt, cottage cheese, high-protein milk . Fruit o Choose berries

## 2020-01-09 LAB — CBC WITH DIFFERENTIAL/PLATELET
Absolute Monocytes: 570 cells/uL (ref 200–950)
Basophils Absolute: 94 cells/uL (ref 0–200)
Basophils Relative: 1.1 %
Eosinophils Absolute: 289 cells/uL (ref 15–500)
Eosinophils Relative: 3.4 %
HCT: 45.5 % (ref 38.5–50.0)
Hemoglobin: 15.2 g/dL (ref 13.2–17.1)
Lymphs Abs: 1921 cells/uL (ref 850–3900)
MCH: 28.7 pg (ref 27.0–33.0)
MCHC: 33.4 g/dL (ref 32.0–36.0)
MCV: 86 fL (ref 80.0–100.0)
MPV: 9.6 fL (ref 7.5–12.5)
Monocytes Relative: 6.7 %
Neutro Abs: 5627 cells/uL (ref 1500–7800)
Neutrophils Relative %: 66.2 %
Platelets: 277 10*3/uL (ref 140–400)
RBC: 5.29 10*6/uL (ref 4.20–5.80)
RDW: 13.3 % (ref 11.0–15.0)
Total Lymphocyte: 22.6 %
WBC: 8.5 10*3/uL (ref 3.8–10.8)

## 2020-01-09 LAB — COMPLETE METABOLIC PANEL WITH GFR
AG Ratio: 2.1 (calc) (ref 1.0–2.5)
ALT: 22 U/L (ref 9–46)
AST: 24 U/L (ref 10–35)
Albumin: 4.8 g/dL (ref 3.6–5.1)
Alkaline phosphatase (APISO): 71 U/L (ref 35–144)
BUN/Creatinine Ratio: 13 (calc) (ref 6–22)
BUN: 24 mg/dL (ref 7–25)
CO2: 28 mmol/L (ref 20–32)
Calcium: 10 mg/dL (ref 8.6–10.3)
Chloride: 101 mmol/L (ref 98–110)
Creat: 1.81 mg/dL — ABNORMAL HIGH (ref 0.70–1.25)
GFR, Est African American: 44 mL/min/{1.73_m2} — ABNORMAL LOW (ref >=60)
GFR, Est Non African American: 38 mL/min/{1.73_m2} — ABNORMAL LOW (ref >=60)
Globulin: 2.3 g/dL (calc) (ref 1.9–3.7)
Glucose, Bld: 101 mg/dL — ABNORMAL HIGH (ref 65–99)
Potassium: 4.9 mmol/L (ref 3.5–5.3)
Sodium: 139 mmol/L (ref 135–146)
Total Bilirubin: 0.6 mg/dL (ref 0.2–1.2)
Total Protein: 7.1 g/dL (ref 6.1–8.1)

## 2020-01-09 LAB — LIPID PANEL
Cholesterol: 208 mg/dL — ABNORMAL HIGH (ref <200)
HDL: 37 mg/dL — ABNORMAL LOW (ref >=40)
LDL Cholesterol (Calc): 120 mg/dL (calc) — ABNORMAL HIGH
Non-HDL Cholesterol (Calc): 171 mg/dL (calc) — ABNORMAL HIGH (ref <130)
Total CHOL/HDL Ratio: 5.6 (calc) — ABNORMAL HIGH (ref <5.0)
Triglycerides: 355 mg/dL — ABNORMAL HIGH (ref <150)

## 2020-01-09 LAB — HEMOGLOBIN A1C
Hgb A1c MFr Bld: 6.2 % of total Hgb — ABNORMAL HIGH (ref <5.7)
Mean Plasma Glucose: 131 (calc)
eAG (mmol/L): 7.3 (calc)

## 2020-01-09 LAB — TSH: TSH: 2.5 mIU/L (ref 0.40–4.50)

## 2020-02-27 ENCOUNTER — Other Ambulatory Visit: Payer: Self-pay | Admitting: Internal Medicine

## 2020-04-23 ENCOUNTER — Other Ambulatory Visit: Payer: Self-pay | Admitting: Internal Medicine

## 2020-04-23 DIAGNOSIS — E1140 Type 2 diabetes mellitus with diabetic neuropathy, unspecified: Secondary | ICD-10-CM

## 2020-06-02 ENCOUNTER — Other Ambulatory Visit: Payer: Self-pay | Admitting: Internal Medicine

## 2020-06-24 IMAGING — CR DG FOREARM 2V*R*
2 series · 2 of 2 positions shown · non-contrast
Comparison: None.

CLINICAL DATA: Crush injury to right forearm, pain

EXAM:
RIGHT FOREARM - 2 VIEW

[forearm ap]
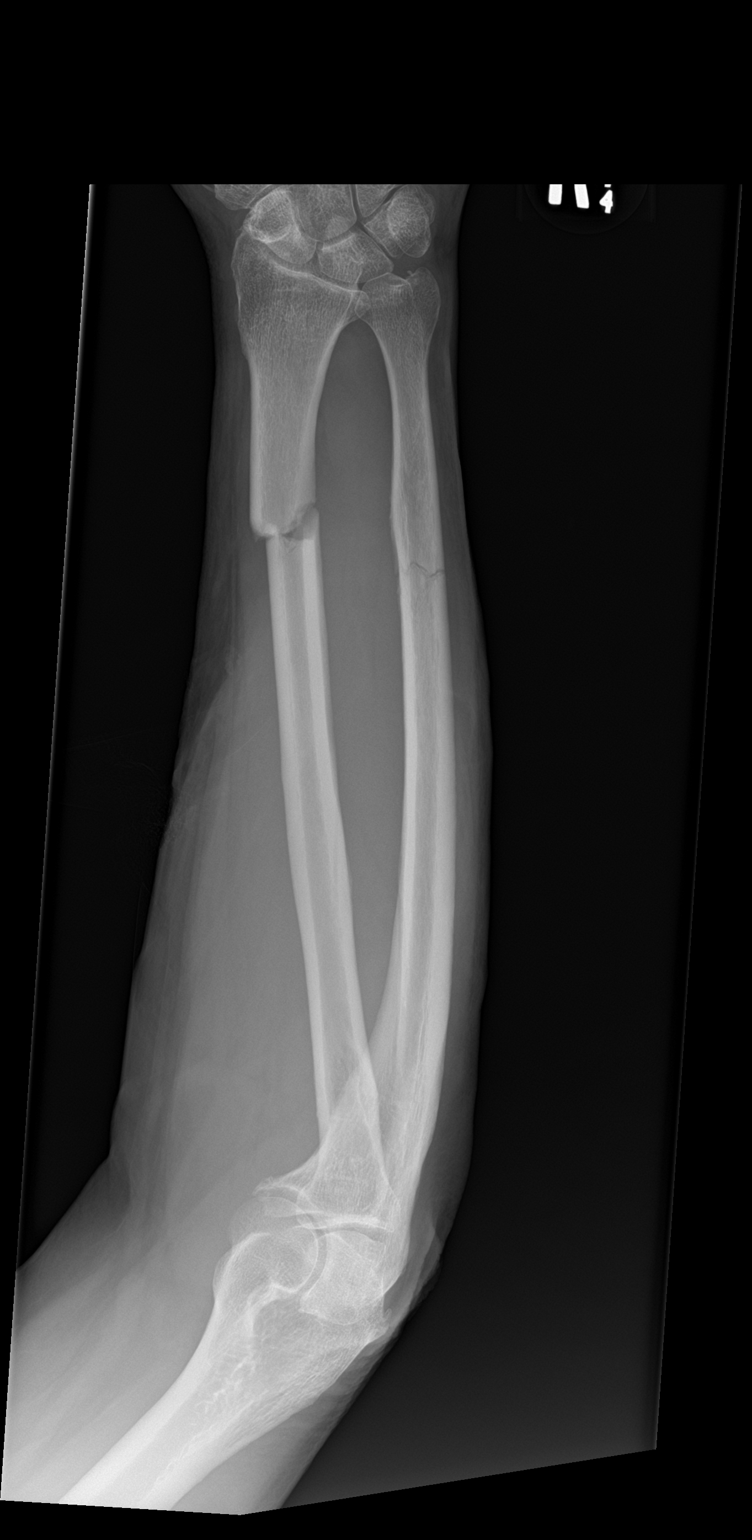

[forearm lat]
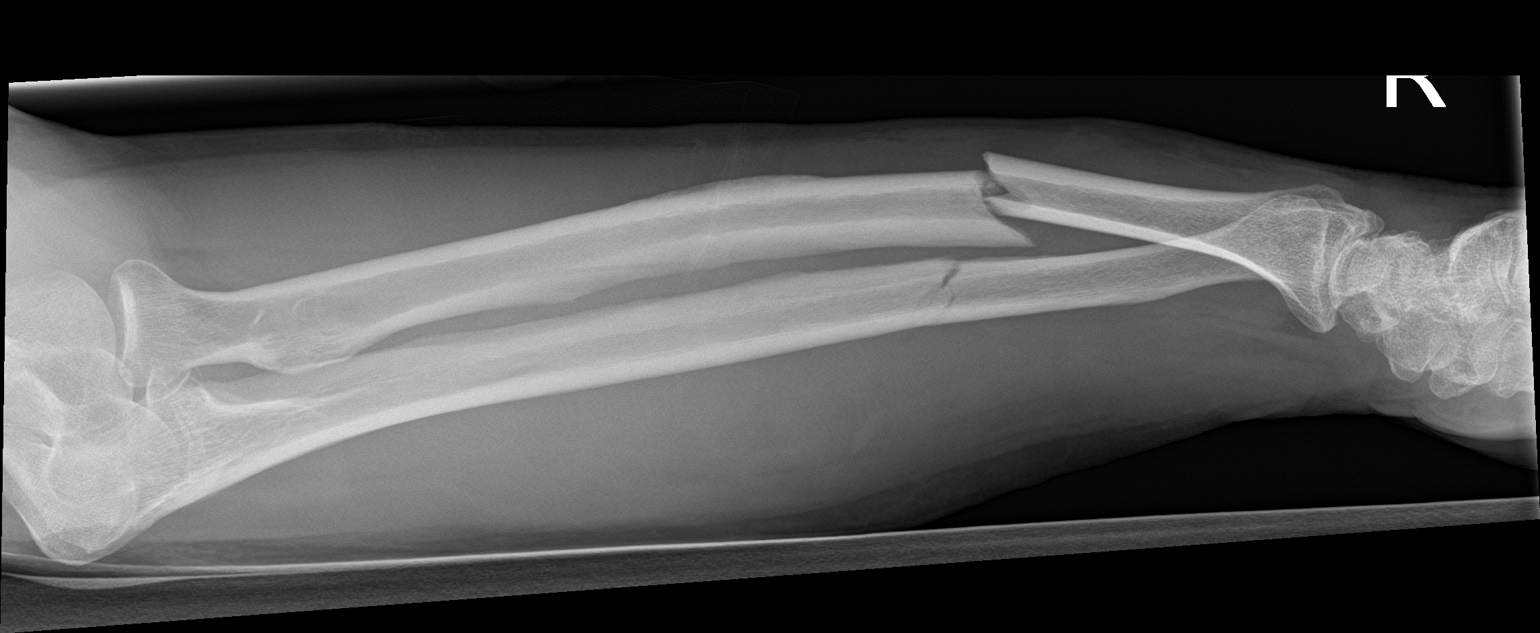

[2 of 2 positions shown; findings below may reference images not displayed]

FINDINGS: Acute transversely oriented fracture of the distal radial diaphysis
with 4 mm of anterior displacement and mild anterior apex
angulation. Nondisplaced transversely oriented fracture of the
distal ulnar diaphysis. Alignment at the elbow and wrist appear
anatomic. No additional fractures are identified. There is soft
tissue swelling of the forearm.
IMPRESSION: 1. Acute mildly displaced and angulated fracture of the distal
radial diaphysis.
2. Nondisplaced transversely oriented fracture of the distal ulnar
diaphysis.

## 2020-06-30 IMAGING — RF DG WRIST COMPLETE 3+V*R*
1 series · 5 of 5 positions shown · non-contrast
Comparison: 07/12/2019 right forearm radiographs

CLINICAL DATA: ORIF right radius and right ulna fractures

EXAM:
DG C-ARM 1-60 MIN; RIGHT WRIST - COMPLETE 3+ VIEW

[Series 1: run · 5 of 5 slices shown]
[im 1/5]
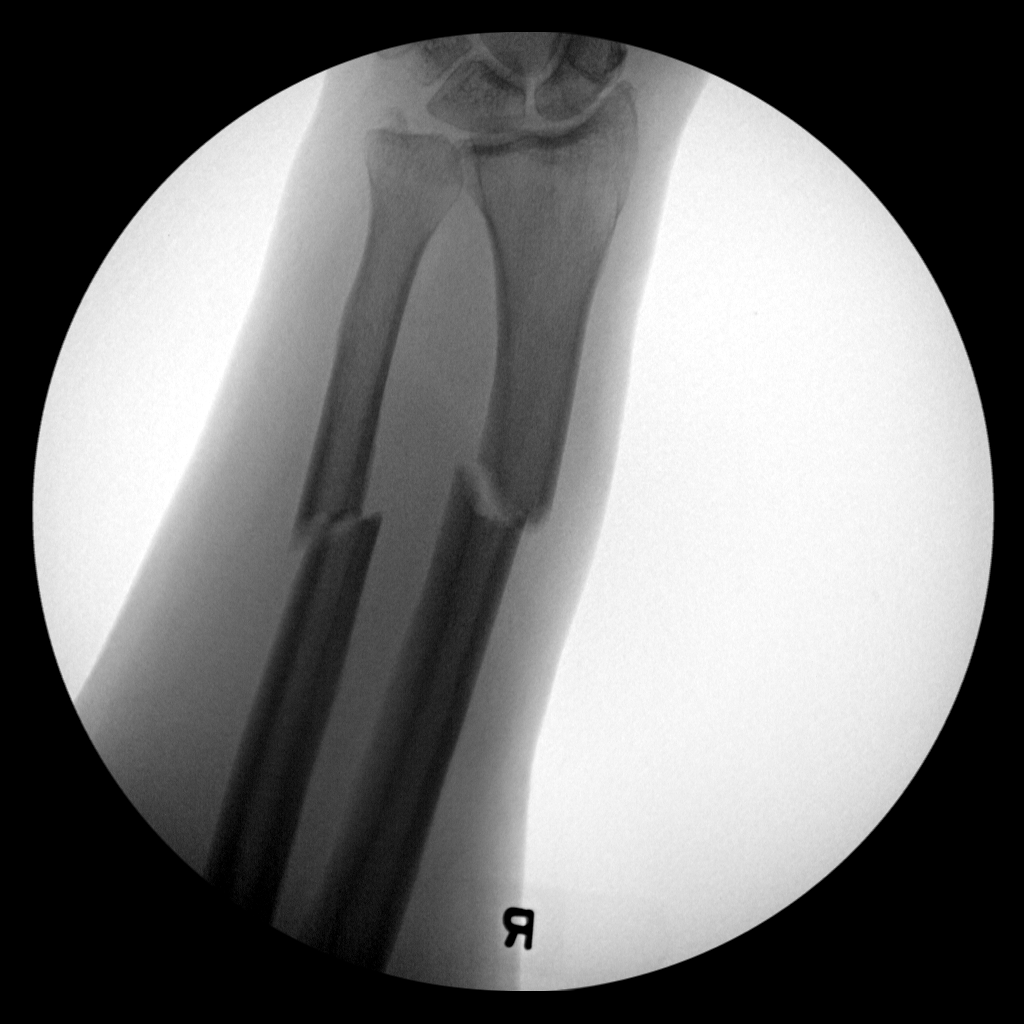
[im 2/5]
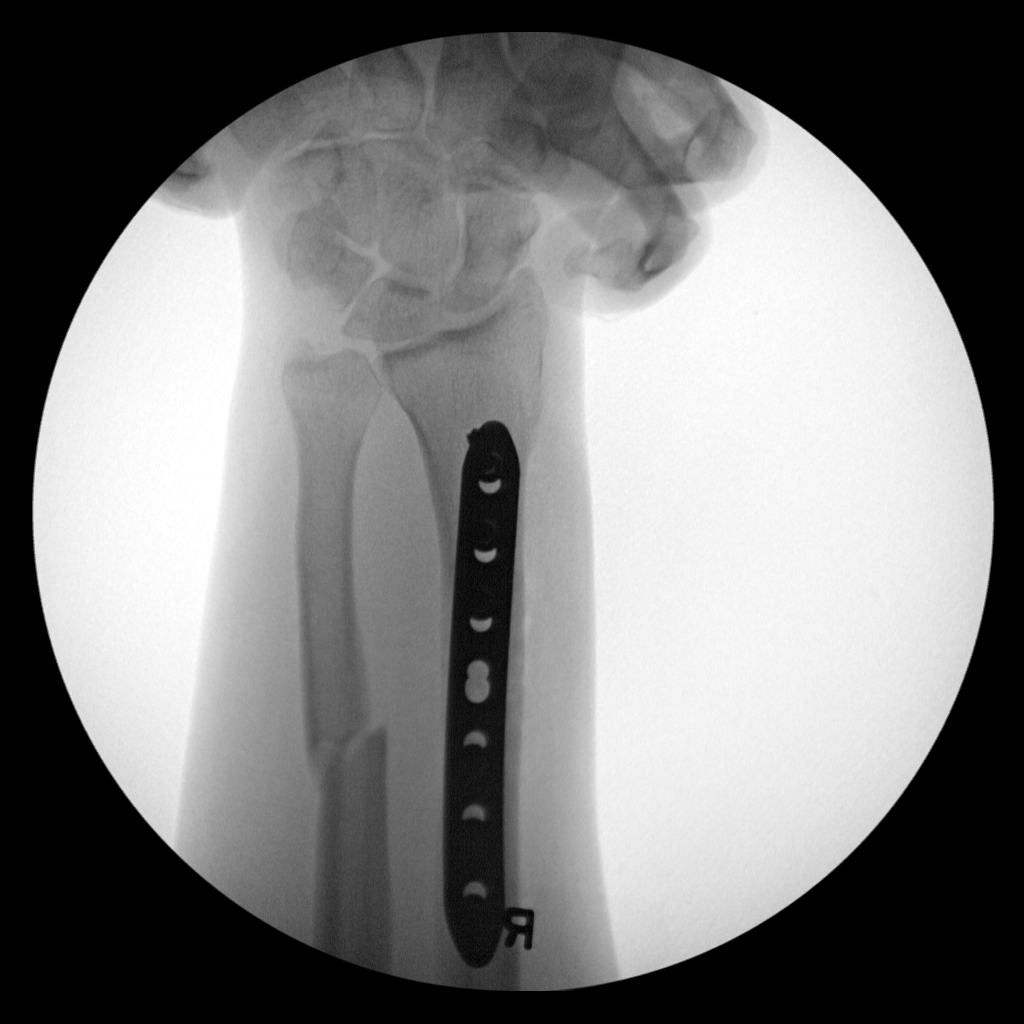
[im 3/5]
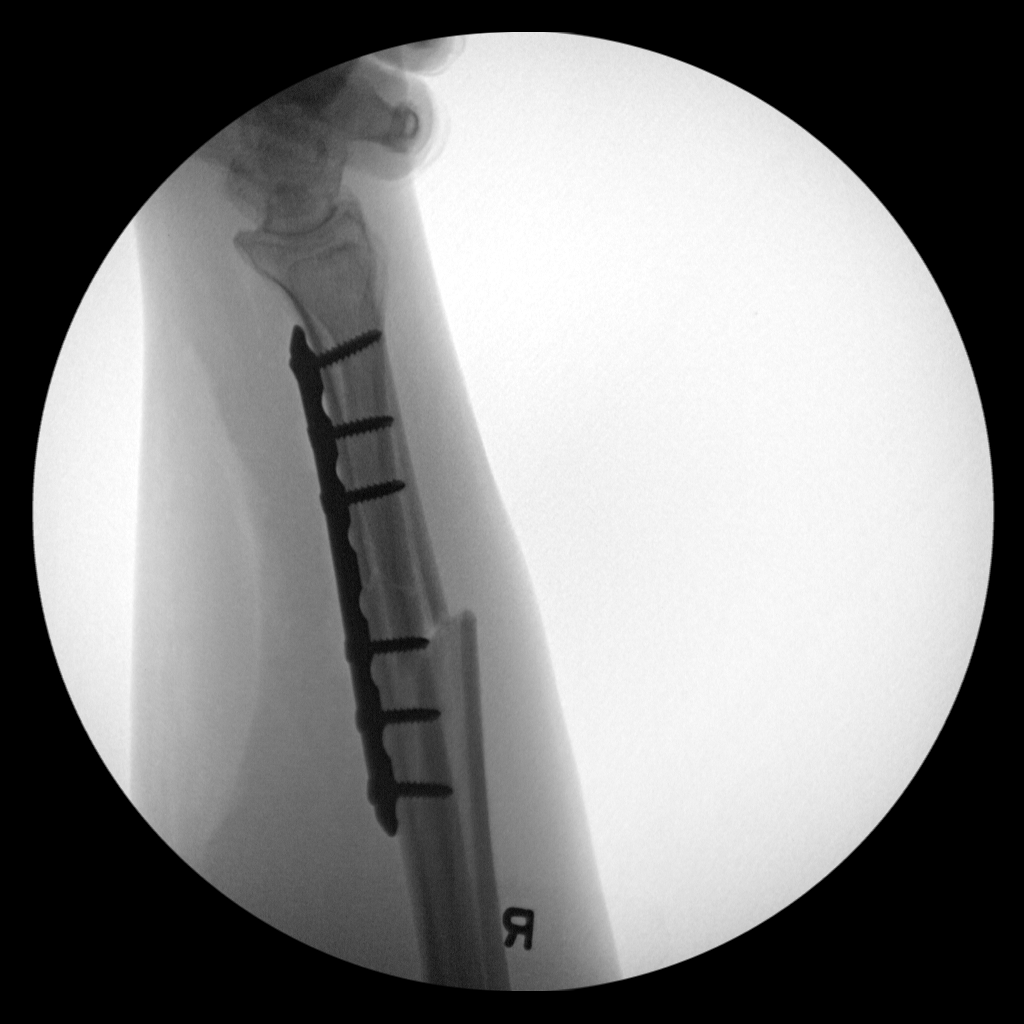
[im 4/5]
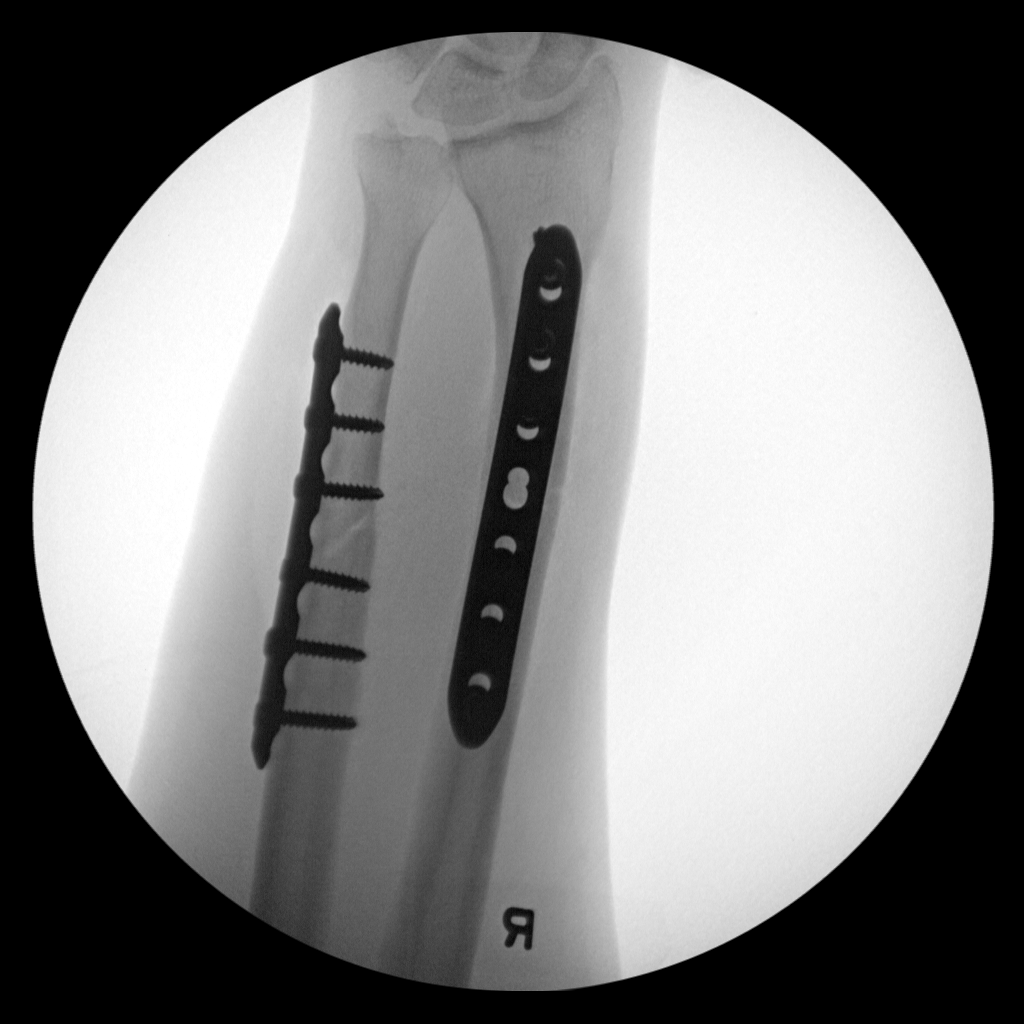
[im 5/5]
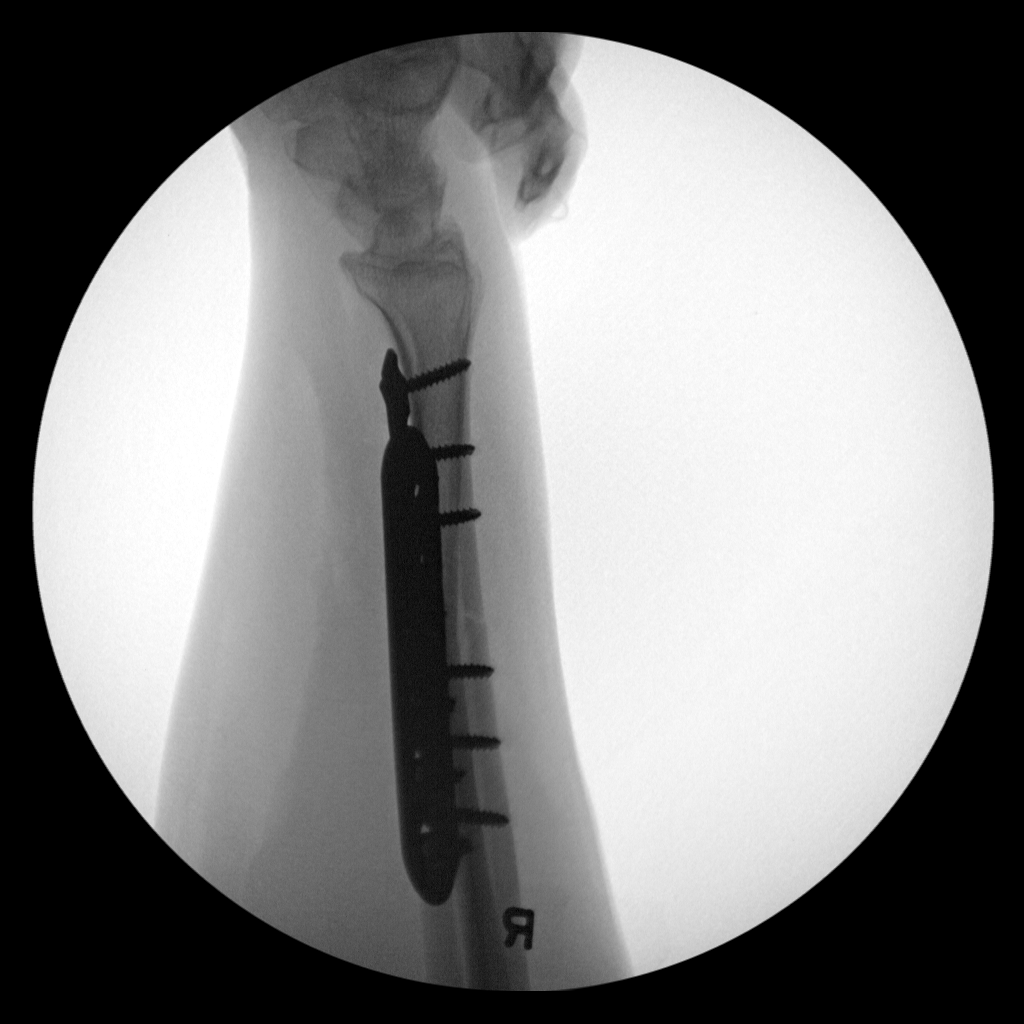

[5 of 5 positions shown; findings below may reference images not displayed]

FLUOROSCOPY TIME:  Fluoroscopy Time:  0 minutes 25 seconds

Number of Acquired Spot Images: 5
FINDINGS: Multiple nondiagnostic spot fluoroscopic intraoperative right
forearm radiographs demonstrate transfixation right distal radius
and right distal ulna fractures in near anatomic alignment by
surgical plates with interlocking screws.
IMPRESSION: Intraoperative fluoroscopic guidance for ORIF right radius and right
ulna shaft fractures.

## 2020-06-30 IMAGING — RF DG C-ARM 1-60 MIN
1 series · 5 of 5 positions shown · non-contrast
Comparison: 07/12/2019 right forearm radiographs

CLINICAL DATA: ORIF right radius and right ulna fractures

EXAM:
DG C-ARM 1-60 MIN; RIGHT WRIST - COMPLETE 3+ VIEW

[Series 1: run · 5 of 5 slices shown]
[im 1/5]
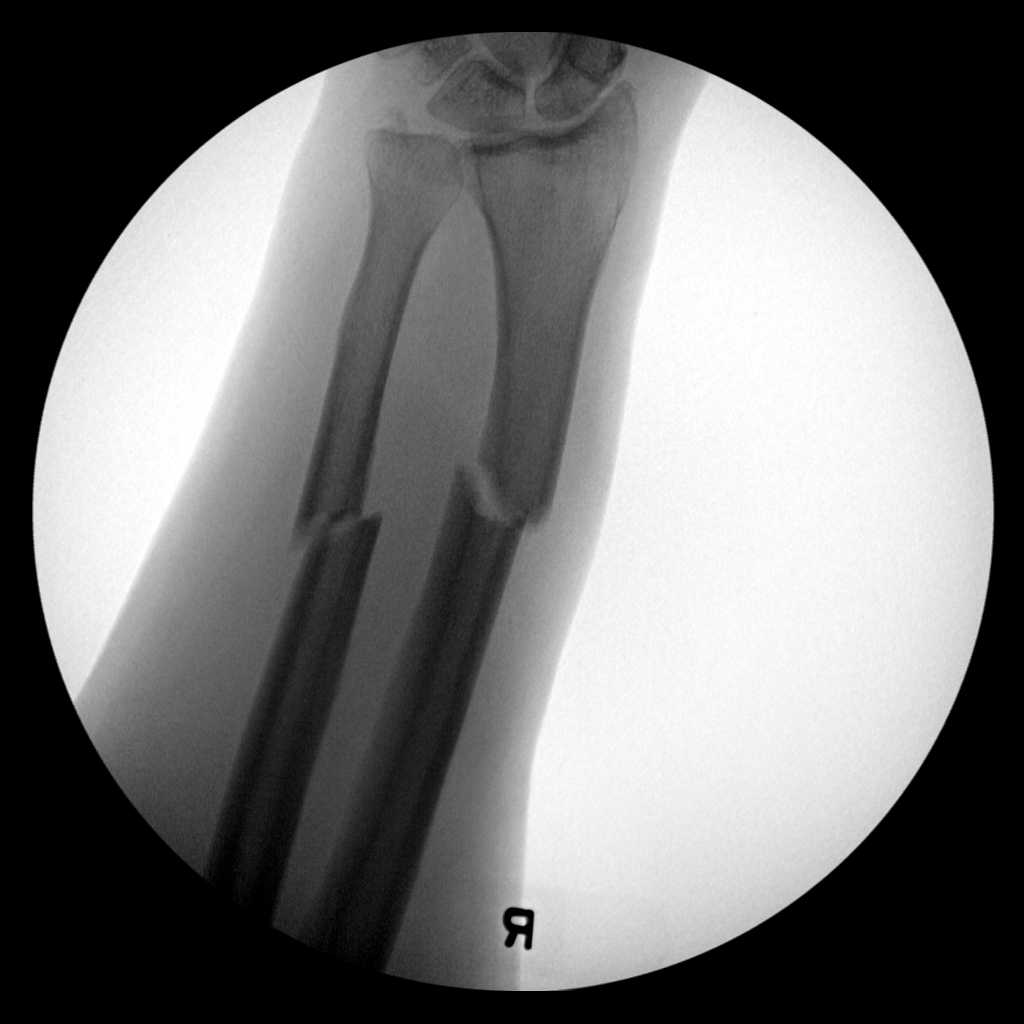
[im 2/5]
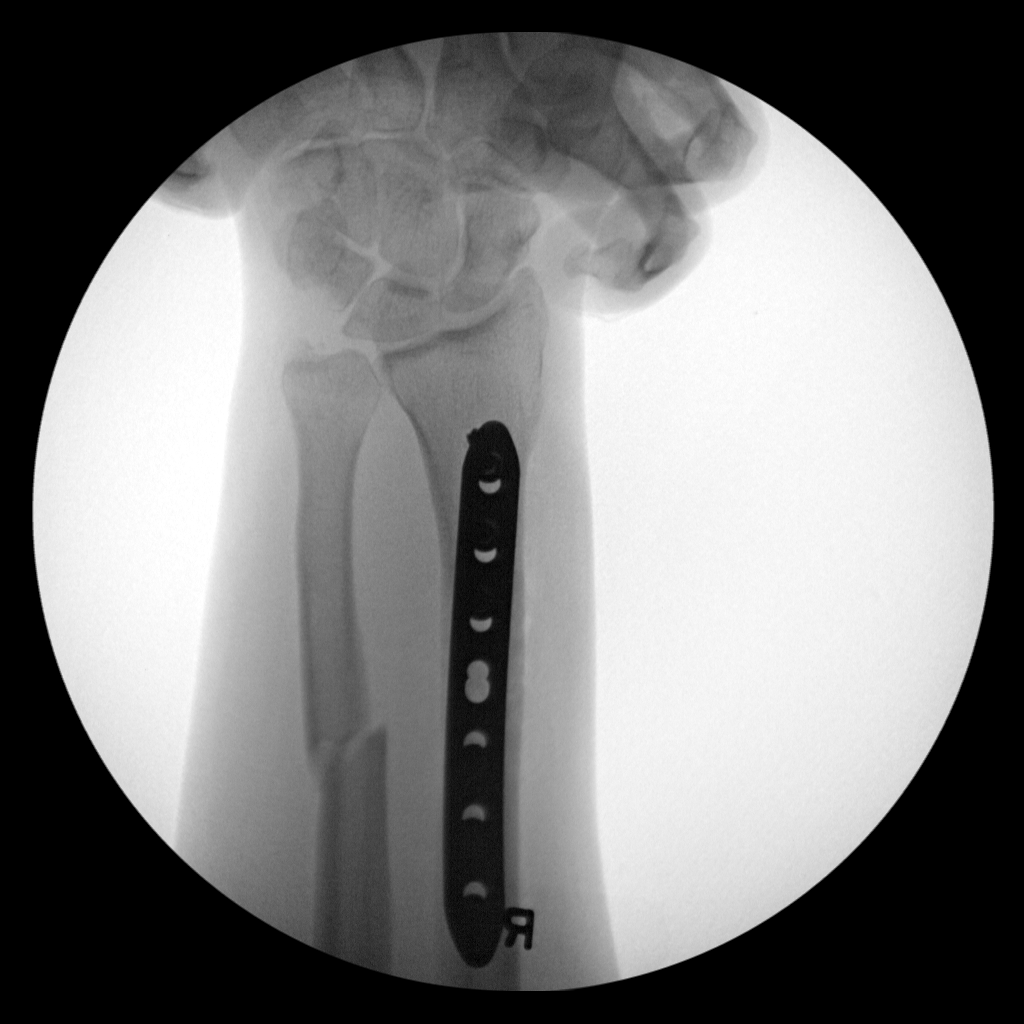
[im 3/5]
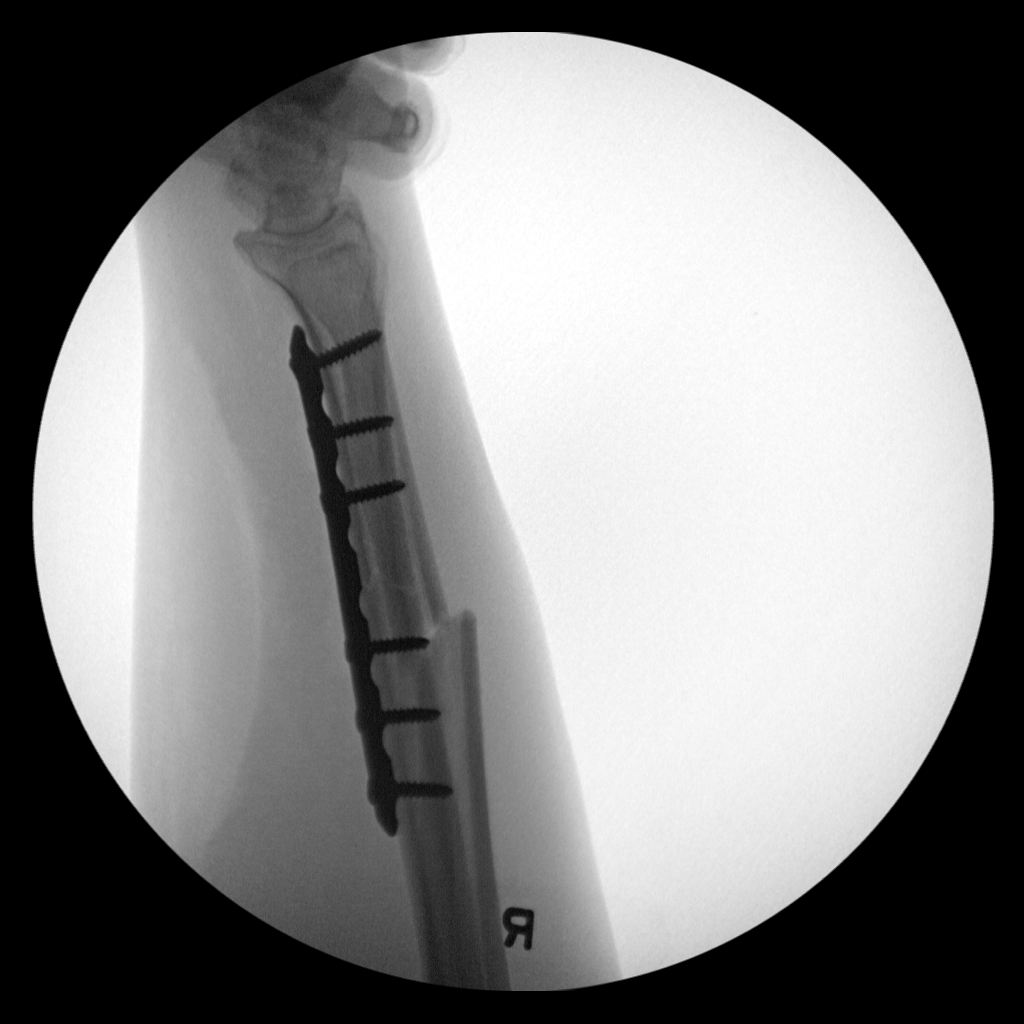
[im 4/5]
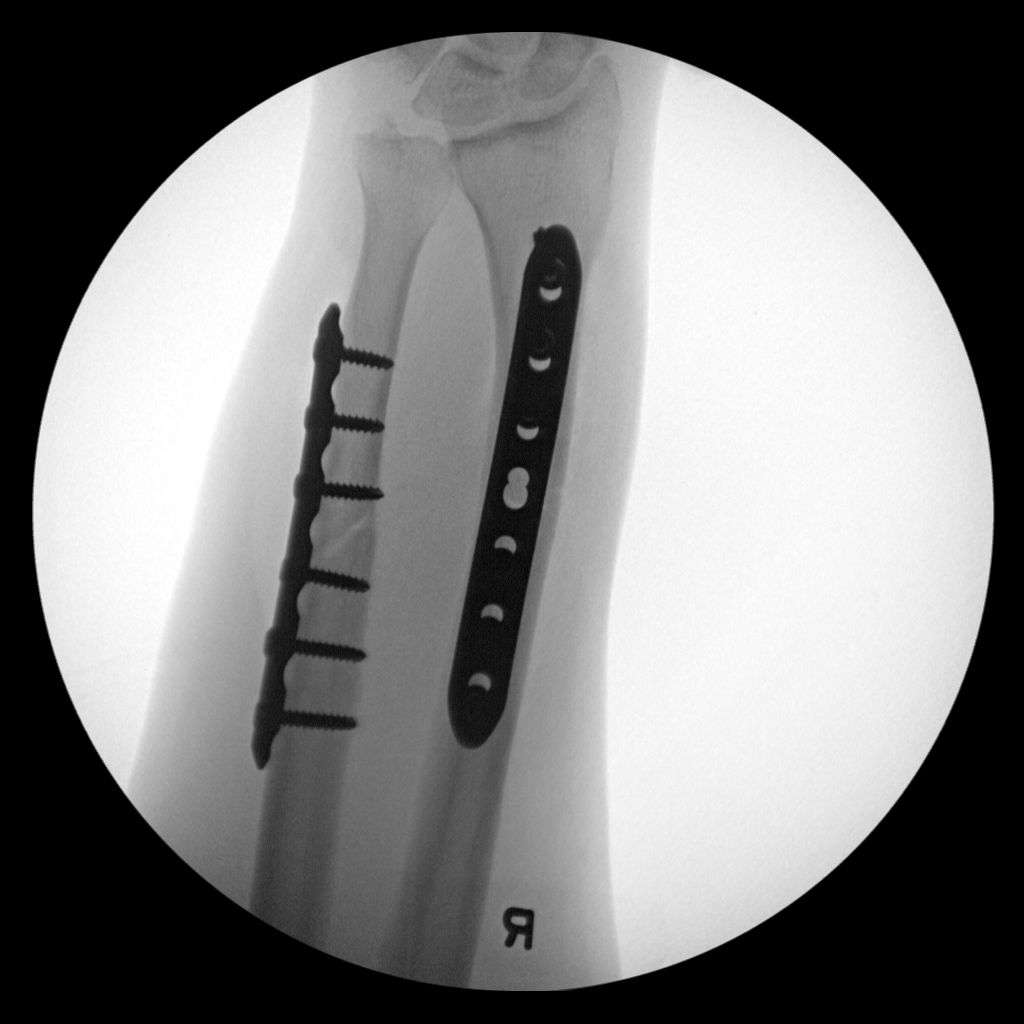
[im 5/5]
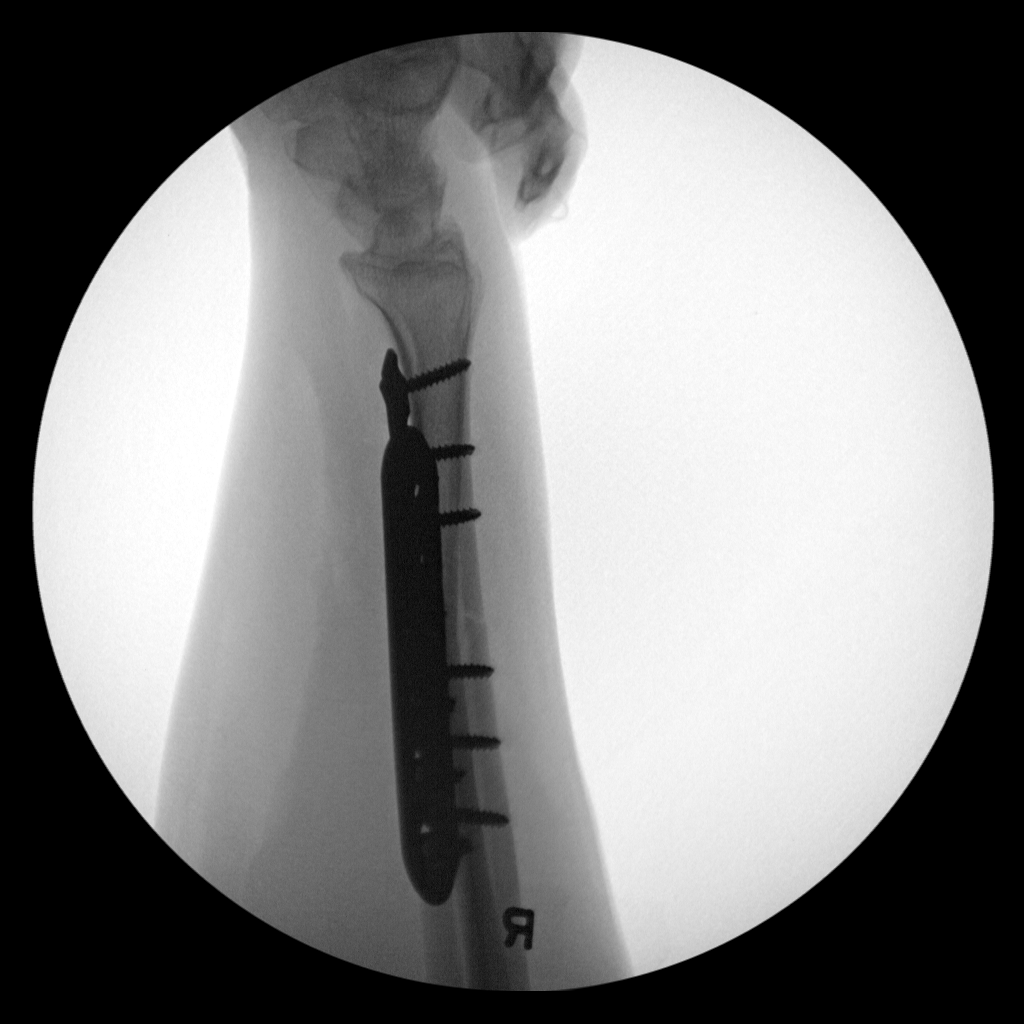

[5 of 5 positions shown; findings below may reference images not displayed]

FLUOROSCOPY TIME:  Fluoroscopy Time:  0 minutes 25 seconds

Number of Acquired Spot Images: 5
FINDINGS: Multiple nondiagnostic spot fluoroscopic intraoperative right
forearm radiographs demonstrate transfixation right distal radius
and right distal ulna fractures in near anatomic alignment by
surgical plates with interlocking screws.
IMPRESSION: Intraoperative fluoroscopic guidance for ORIF right radius and right
ulna shaft fractures.

## 2020-06-30 IMAGING — DX DG FOREARM 2V*R*
1 series · 2 of 2 positions shown · non-contrast
Comparison: 07/12/2019

CLINICAL DATA: Postop

EXAM:
RIGHT FOREARM - 2 VIEW

[Series 1: forearmbone · 0.14mm/px · 2 of 2 slices shown]
[im 1/2]
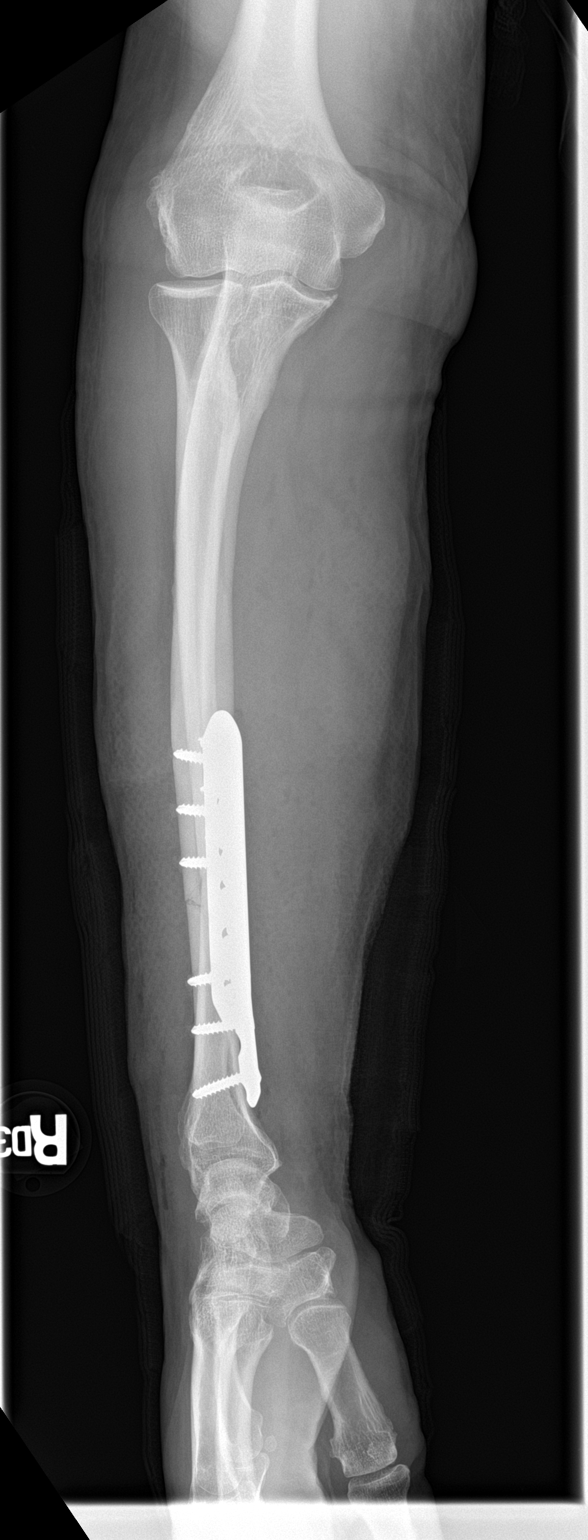
[im 2/2]
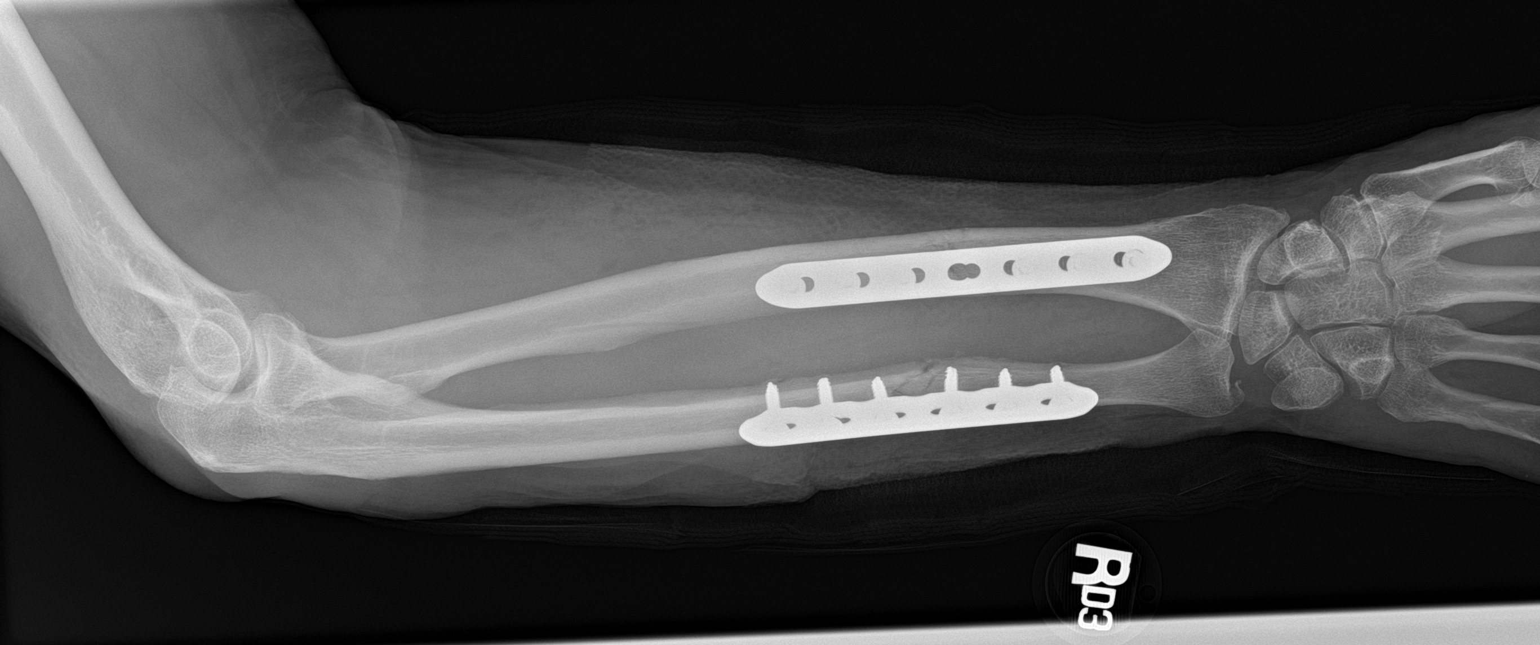

[2 of 2 positions shown; findings below may reference images not displayed]

FINDINGS: Plate and screw fixation right radius and ulna diaphysis fractures.
Alignment is near anatomic.
IMPRESSION: Post fixation of right radius and ulna diaphysis fractures. Near
anatomic alignment.

## 2020-07-04 ENCOUNTER — Other Ambulatory Visit: Payer: Self-pay | Admitting: Internal Medicine

## 2020-07-04 MED ORDER — METFORMIN HCL ER 500 MG PO TB24
ORAL_TABLET | ORAL | 0 refills | Status: DC
Start: 2020-07-04 — End: 2020-10-26

## 2020-07-06 ENCOUNTER — Encounter: Payer: PPO | Admitting: Internal Medicine

## 2020-09-14 ENCOUNTER — Other Ambulatory Visit: Payer: Self-pay | Admitting: Internal Medicine

## 2020-09-14 MED ORDER — ENALAPRIL MALEATE 20 MG PO TABS
ORAL_TABLET | ORAL | 0 refills | Status: DC
Start: 2020-09-14 — End: 2021-01-21

## 2020-09-16 ENCOUNTER — Encounter: Payer: PPO | Admitting: Internal Medicine

## 2020-09-28 ENCOUNTER — Other Ambulatory Visit: Payer: Self-pay | Admitting: Internal Medicine

## 2020-09-28 MED ORDER — DEXAMETHASONE 4 MG PO TABS: ORAL_TABLET | ORAL | 0 refills | Status: DC

## 2020-09-28 MED ORDER — PROMETHAZINE-PHENYLEPHRINE 6.25-5 MG/5ML PO SYRP: ORAL_SOLUTION | ORAL | 1 refills | Status: DC

## 2020-09-28 MED ORDER — AZITHROMYCIN 250 MG PO TABS: ORAL_TABLET | ORAL | 0 refills | Status: DC

## 2020-10-26 ENCOUNTER — Other Ambulatory Visit: Payer: Self-pay | Admitting: Internal Medicine

## 2020-11-05 ENCOUNTER — Other Ambulatory Visit: Payer: Self-pay | Admitting: Internal Medicine

## 2020-11-05 DIAGNOSIS — M1000 Idiopathic gout, unspecified site: Secondary | ICD-10-CM

## 2020-11-05 DIAGNOSIS — E782 Mixed hyperlipidemia: Secondary | ICD-10-CM

## 2020-11-05 DIAGNOSIS — E1140 Type 2 diabetes mellitus with diabetic neuropathy, unspecified: Secondary | ICD-10-CM

## 2020-11-05 MED ORDER — EZETIMIBE 10 MG PO TABS: ORAL_TABLET | ORAL | 3 refills | Status: DC

## 2020-11-05 MED ORDER — GABAPENTIN 600 MG PO TABS: ORAL_TABLET | ORAL | 0 refills | Status: DC

## 2020-11-05 MED ORDER — COLCHICINE 0.6 MG PO TABS: ORAL_TABLET | ORAL | 1 refills | Status: DC

## 2020-12-02 ENCOUNTER — Ambulatory Visit (INDEPENDENT_AMBULATORY_CARE_PROVIDER_SITE_OTHER): Payer: PPO | Admitting: Internal Medicine

## 2020-12-02 ENCOUNTER — Other Ambulatory Visit: Payer: Self-pay

## 2020-12-02 ENCOUNTER — Encounter: Payer: Self-pay | Admitting: Internal Medicine

## 2020-12-02 VITALS — BP 136/70 | HR 67 | Temp 97.5°F | Resp 16 | Ht 69.0 in | Wt 159.2 lb

## 2020-12-02 DIAGNOSIS — N401 Enlarged prostate with lower urinary tract symptoms: Secondary | ICD-10-CM

## 2020-12-02 DIAGNOSIS — Z136 Encounter for screening for cardiovascular disorders: Secondary | ICD-10-CM

## 2020-12-02 DIAGNOSIS — Z0000 Encounter for general adult medical examination without abnormal findings: Secondary | ICD-10-CM

## 2020-12-02 DIAGNOSIS — N138 Other obstructive and reflux uropathy: Secondary | ICD-10-CM | POA: Diagnosis not present

## 2020-12-02 DIAGNOSIS — E1169 Type 2 diabetes mellitus with other specified complication: Secondary | ICD-10-CM

## 2020-12-02 DIAGNOSIS — Z8249 Family history of ischemic heart disease and other diseases of the circulatory system: Secondary | ICD-10-CM

## 2020-12-02 DIAGNOSIS — R972 Elevated prostate specific antigen [PSA]: Secondary | ICD-10-CM | POA: Diagnosis not present

## 2020-12-02 DIAGNOSIS — E785 Hyperlipidemia, unspecified: Secondary | ICD-10-CM | POA: Diagnosis not present

## 2020-12-02 DIAGNOSIS — E1122 Type 2 diabetes mellitus with diabetic chronic kidney disease: Secondary | ICD-10-CM

## 2020-12-02 DIAGNOSIS — T466X5A Adverse effect of antihyperlipidemic and antiarteriosclerotic drugs, initial encounter: Secondary | ICD-10-CM

## 2020-12-02 DIAGNOSIS — Z0001 Encounter for general adult medical examination with abnormal findings: Secondary | ICD-10-CM

## 2020-12-02 DIAGNOSIS — Z79899 Other long term (current) drug therapy: Secondary | ICD-10-CM

## 2020-12-02 DIAGNOSIS — I10 Essential (primary) hypertension: Secondary | ICD-10-CM

## 2020-12-02 DIAGNOSIS — E559 Vitamin D deficiency, unspecified: Secondary | ICD-10-CM | POA: Diagnosis not present

## 2020-12-02 DIAGNOSIS — Z1211 Encounter for screening for malignant neoplasm of colon: Secondary | ICD-10-CM

## 2020-12-02 DIAGNOSIS — M1000 Idiopathic gout, unspecified site: Secondary | ICD-10-CM

## 2020-12-02 DIAGNOSIS — E1140 Type 2 diabetes mellitus with diabetic neuropathy, unspecified: Secondary | ICD-10-CM

## 2020-12-02 DIAGNOSIS — N1831 Chronic kidney disease, stage 3a: Secondary | ICD-10-CM

## 2020-12-02 DIAGNOSIS — D692 Other nonthrombocytopenic purpura: Secondary | ICD-10-CM

## 2020-12-02 NOTE — Progress Notes (Signed)
Annual  Screening/Preventative Visit  & Comprehensive Evaluation & Examination  Future Appointments  Date Time Provider [place]  03/10/2021  9:30 AM Magda Bernheim, NP [facility] None  06/09/2021  9:30 AM Unk Pinto, MD [facility] None  12/02/2021 11:00 AM Unk Pinto, MD [facility] None            This very nice 70 y.o. WWM presents for a Screening /Preventative Visit & comprehensive evaluation and management of multiple medical co-morbidities.  Patient has been followed for HTN, HLD, T2_NIDDM  and Vitamin D Deficiency. Patient has hx/o Gout & is not currently on meds.       HTN predates circa 2000. Patient's BP has been controlled at home.  Today's BP is at goal - 136/70. Patient denies any cardiac symptoms as chest pain, palpitations, shortness of breath, dizziness or ankle swelling.       Patient is Statin Intolerant & his lipids are not controlled with diet and  he has stopped taking his Gemfibrozil & Ezetimibe.  Last lipids were not at goal:  Lab Results  Component Value Date   CHOL 208 (H) 01/08/2020   HDL 37 (L) 01/08/2020   LDLCALC 120 (H) 01/08/2020   TRIG 355 (H) 01/08/2020   CHOLHDL 5.6 (H) 01/08/2020         Patient has hx/o T2_NIDDM  (2002) w/CKD3 (GFR 49)  and patient denies reactive hypoglycemic symptoms, visual blurring, diabetic polys, but does have paresthesias of his toes. Last A1c was not at goal:   Lab Results  Component Value Date   HGBA1C 6.0 (H) 12/02/2020         Finally, patient has history of Vitamin D Deficiency ("14" /2010) and last vitamin D was at goal:   Lab Results  Component Value Date   VD25OH 74 12/02/2020     Current Outpatient Medications on File Prior to Visit  Medication Sig   BABY ASPIRIN PO Take  daily.   VITAMIN D  5,000 Units  Take by mouth daily.   TOPICORT  0.25 % cream Apply  daily as needed (Skin rash).    enalapril 20 MG tablet Take  1 tablet  Daily    gabapentin 600 MG tablet Take  1/2 to 1  tablet 3 x /day   metFORMIN -XR 500 MG  TAKE TWO TABLETS TWICE A DAY     Allergies  Allergen Reactions   Crestor [Rosuvastatin] Diarrhea   Welchol [Colesevelam Hcl] Diarrhea   Zoloft [Sertraline Hcl]     unknown   Penicillins     Past Medical History:  Diagnosis Date   Allergy    Bone spur    Left Shoulder   CKD stage 3a due to type 2 diabetes mellitus ([place]) 07/18/2017   Fracture    right forearm   Gout    Hyperlipidemia    Hypertension    Other testicular hypofunction    PONV (postoperative nausea and vomiting)    Type II  diabetes mellitus       Health Maintenance  Topic Date Due   COVID-19 Vaccine (1) Never done   Zoster Vaccines- Shingrix (1 of 2) Never done   OPHTHALMOLOGY EXAM  07/21/2018   HEMOGLOBIN A1C  07/10/2020   COLONOSCOPY  11/07/2020   INFLUENZA VACCINE  01/18/2021   FOOT EXAM  12/02/2021   TETANUS/TDAP  12/26/2022   Hepatitis C Screening  Completed   PNA vac Low Risk Adult  Completed   HPV VACCINES  Aged Out  Immunization History  Administered Date(s) Administered   PPD Test 02/07/2014, 02/16/2015   Pneumococcal -13 04/12/2017   Pneumococcal -23 11/01/2010, 05/29/2018   Td 12/25/2012   Zoster, Live 12/25/2012    Last Colon - 11/07/2017 - Dr Loletha Carrow  - recc f/u Colon 3 years due may 2022 - (Overdue)   Past Surgical History:  Procedure Laterality Date   CYST REMOVAL HAND     ORIF RADIAL FRACTURE Right 07/18/2019   Procedure: OPEN REDUCTION INTERNAL FIXATION (ORIF) RADIUS AND ULNA FRACTURE;  Surgeon: Shona Needles, MD;  Location: [facility];  Service: Orthopedics;  Laterality: Right;   PROSTATE BIOPSY  2010   WRIST SURGERY Right 1998     Family History  Problem Relation Age of Onset   Heart disease Father    Kidney disease Father    Heart failure Father    Heart disease Mother 47       stent   Colon cancer Neg Hx    Esophageal cancer Neg Hx    Liver cancer Neg Hx    Pancreatic cancer Neg Hx    Rectal cancer Neg Hx    Stomach cancer  Neg Hx     Social History   Socioeconomic History   Marital status: Widowed   Number of children: 1 daughter  Occupational History   Works Interior and spatial designer  Tobacco Use   Smoking status: Never   Smokeless tobacco: Never  Vaping Use   Vaping Use: Never used  Substance and Sexual Activity   Alcohol use: Not Currently   Drug use: No   Sexual activity: Not on file      ROS Constitutional: Denies fever, chills, weight loss/gain, headaches, insomnia,  night sweats or change in appetite. Does c/o fatigue. Eyes: Denies redness, blurred vision, diplopia, discharge, itchy or watery eyes.  ENT: Denies discharge, congestion, post nasal drip, epistaxis, sore throat, earache, hearing loss, dental pain, Tinnitus, Vertigo, Sinus pain or snoring.  Cardio: Denies chest pain, palpitations, irregular heartbeat, syncope, dyspnea, diaphoresis, orthopnea, PND, claudication or edema Respiratory: denies cough, dyspnea, DOE, pleurisy, hoarseness, laryngitis or wheezing.  Gastrointestinal: Denies dysphagia, heartburn, reflux, water brash, pain, cramps, nausea, vomiting, bloating, diarrhea, constipation, hematemesis, melena, hematochezia, jaundice or hemorrhoids Genitourinary: Denies dysuria, frequency, urgency, nocturia, hesitancy, discharge, hematuria or flank pain Musculoskeletal: Denies arthralgia, myalgia, stiffness, Jt. Swelling, pain, limp or strain/sprain. Denies Falls. Skin: Denies puritis, rash, hives, warts, acne, eczema or change in skin lesion Neuro: No weakness, tremor, incoordination, spasms, paresthesia or pain Psychiatric: Denies confusion, memory loss or sensory loss. Denies Depression. Endocrine: Denies change in weight, skin, hair change, nocturia, and paresthesia, diabetic polys, visual blurring or hyper / hypo glycemic episodes.  Heme/Lymph: No excessive bleeding, bruising or enlarged lymph nodes.   Physical Exam  BP 136/70   Pulse 67   Temp (!) 97.5 F (36.4 C)   Resp 16    Ht 5\' 9"  (1.753 m)   Wt 159 lb 3.2 oz (72.2 kg)   SpO2 99%   BMI 23.51 kg/m   General Appearance: Well nourished and well groomed and in no apparent distress.  Eyes: PERRLA, EOMs, conjunctiva no swelling or erythema, normal fundi and vessels. Sinuses: No frontal/maxillary tenderness ENT/Mouth: EACs patent / TMs  nl. Nares clear without erythema, swelling, mucoid exudates. Oral hygiene is good. No erythema, swelling, or exudate. Tongue normal, non-obstructing. Tonsils not swollen or erythematous. Hearing normal.  Neck: Supple, thyroid not palpable. No bruits, nodes or JVD. Respiratory: Respiratory effort normal.  BS  equal and clear bilateral without rales, rhonci, wheezing or stridor. Cardio: Heart sounds are normal with regular rate and rhythm and no murmurs, rubs or gallops. Peripheral pulses are normal and equal bilaterally without edema. No aortic or femoral bruits. Chest: symmetric with normal excursions and percussion.  Abdomen: Soft, with Nl bowel sounds. Nontender, no guarding, rebound, hernias, masses, or organomegaly.  Lymphatics: Non tender without lymphadenopathy.  Musculoskeletal: Full ROM all peripheral extremities, joint stability, 5/5 strength, and normal gait. Skin: Warm and dry without rashes, lesions, cyanosis, clubbing or  ecchymosis.  Neuro: Cranial nerves intact, reflexes equal bilaterally. Normal muscle tone, no cerebellar symptoms. Sensation intact.  Pysch: Alert and oriented X 3 with normal affect, insight and judgment appropriate.   Assessment and Plan  1. Annual Preventative/Screening Exam    2. Essential hypertension  - EKG 12-Lead - [place], RETROPERITNL ABD,  LTD - Urinalysis, Routine w reflex microscopic - Microalbumin / creatinine urine ratio - CBC with Differential/Platelet - COMPLETE METABOLIC PANEL WITH GFR - Magnesium - TSH  3. Hyperlipidemia associated with type 2 diabetes mellitus (HCC)  - Lipid panel - TSH  4. Type 2 diabetes mellitus with  stage 3a chronic kidney  disease, unspecified whether long term insulin use (HCC)  - Urinalysis, Routine w reflex microscopic - Microalbumin / creatinine urine ratio - HM DIABETES FOOT EXAM - LOW EXTREMITY NEUR EXAM DOCUM - PTH, intact and calcium - Hemoglobin A1c - Insulin, random  5. Vitamin D deficiency  - VITAMIN D 25 Hydroxy   6. Idiopathic gout   7. Diabetic neuropathy, painful ([place])  - HM DIABETES FOOT EXAM - LOW EXTREMITY NEUR EXAM DOCUM  8. Senile purpura (HCC)  - LOW EXTREMITY NEUR EXAM DOCUM  9. Statin myopathy   10. BPH with obstruction/lower urinary tract symptoms  - PSA  11. Elevated PSA  - PSA  12. Screening for colorectal cancer  - POC Hemoccult Bld/Stl   13. Screening for ischemic heart disease  - [place], RETROPERITNL ABD,  LTD  14. FHx: heart disease  - EKG 12-Lead - [place], RETROPERITNL ABD,  LTD - Urinalysis, Routine w reflex microscopic  15. Screening for AAA (aortic abdominal aneurysm)  - [place], RETROPERITNL ABD,  LTD  16. Medication management  - [place], RETROPERITNL ABD,  LTD - Urinalysis, Routine w reflex microscopic - CBC with Differential/Platelet - COMPLETE METABOLIC PANEL WITH GFR - Magnesium - Lipid panel - TSH - Hemoglobin A1c - Insulin, random - VITAMIN D 25 Hydroxy           Patient was counseled in prudent diet, weight control to achieve/maintain BMI less than 25, BP monitoring, regular exercise and medications as discussed.  Discussed med effects and SE's. Routine screening labs and tests as requested with regular follow-up as recommended. Over 40 minutes of exam, counseling, chart review and high complex critical decision making was performed   Kirtland Bouchard, MD

## 2020-12-02 NOTE — Patient Instructions (Addendum)

## 2020-12-02 NOTE — Progress Notes (Signed)
Annual  Screening/Preventative Visit  & Comprehensive Evaluation & Examination  Future Appointments  Date Time Provider [place]  12/02/2020 11:00 AM Unk Pinto, MD [facility] None  12/02/2021 11:00 AM Unk Pinto, MD [facility] None            This very nice 70 y.o.male presents for a Screening /Preventative Visit & comprehensive evaluation and management of multiple medical co-morbidities.  Patient has been followed for HTN, HLD, T2_NIDDM  Prediabetes and Vitamin D Deficiency.       HTN predates since     . Patient's BP has been controlled at home.  Today's  . Patient denies any cardiac symptoms as chest pain, palpitations, shortness of breath, dizziness or ankle swelling.       Patient's hyperlipidemia is controlled with diet and medications. Patient denies myalgias or other medication SE's. Last lipids were  Lab Results  Component Value Date   CHOL 208 (H) 01/08/2020   HDL 37 (L) 01/08/2020   LDLCALC 120 (H) 01/08/2020   TRIG 355 (H) 01/08/2020   CHOLHDL 5.6 (H) 01/08/2020         Patient has hx/o T2_NIDDM prediabetes since    and patient denies reactive hypoglycemic symptoms, visual blurring, diabetic polys or paresthesias. Last A1c was   Lab Results  Component Value Date   HGBA1C 6.2 (H) 01/08/2020          Finally, patient has history of Vitamin D Deficiency of    and last vitamin D was   Lab Results  Component Value Date   VD25OH 74 06/17/2019     Current Outpatient Medications on File Prior to Visit  Medication Sig   acetaminophen (TYLENOL) 500 MG tablet Take 1,000 mg by mouth every 6 (six) hours as needed for mild pain.   allopurinol (ZYLOPRIM) 300 MG tablet Take 1 tablet Daily to Prevent Gout (Patient taking differently: Take 300 mg by mouth daily. )   azithromycin (ZITHROMAX) 250 MG tablet Take 2 tablets with Food on  Day 1, then 1 tablet Daily with Food for Sinusitis / Bronchitis   BABY ASPIRIN PO Take 81 mg by mouth daily.    Cholecalciferol (VITAMIN D PO) Take 5,000 Int'l Units by mouth daily.   colchicine 0.6 MG tablet Take 1 to 2 tablets daily for gout attack and PRN.   desoximetasone (TOPICORT) 0.25 % cream Apply 1 application topically daily as needed (Skin rash).    dexamethasone (DECADRON) 4 MG tablet Take 1 tab 3 x /day for 2 days,      then 2 x /day for 2  Days,     then 1 tab daily   enalapril (VASOTEC) 20 MG tablet Take  1 tablet  Daily  for BP & Diabetic Kidney Protection   ezetimibe (ZETIA) 10 MG tablet Take 1 tablet Daily for Cholesterol   gabapentin (NEURONTIN) 600 MG tablet Take  1/2 to 1 tablet 3 x /day  for Neuropathy pain   gemfibrozil (LOPID) 600 MG tablet Take 1 tablet 2 x  /day with Meals for Cholesterol & Triglycerides   glucose blood (FREESTYLE TEST STRIPS) test strip CHECK BLOOD SUGAR 1 TIME DAILY-DX-E11.22   glucose monitoring kit (FREESTYLE) monitoring kit Check blood sugar 1 time daily-DX-E11.22.   Lancets (FREESTYLE) lancets CHECK BLOOD SUGAR 1 TIME DAILY-DX-E11.22   metFORMIN (GLUCOPHAGE-XR) 500 MG 24 hr tablet TAKE TWO TABLETS BY MOUTH TWICE A DAY WITH MEALS FOR DIABETES   promethazine (PHENERGAN) 12.5 MG tablet Take 1-2 tablets (12.5-25 mg  total) by mouth every 6 (six) hours as needed for nausea or vomiting.   promethazine-phenylephrine (PROMETHAZINE VC) 6.25-5 MG/5ML SYRP Take  1 teaspoonful (5 ml)  every 4 hours  as needed for Congestion   No current facility-administered medications on file prior to visit.     Allergies  Allergen Reactions   Crestor [Rosuvastatin] Diarrhea   Welchol [Colesevelam Hcl] Diarrhea   Zoloft [Sertraline Hcl] Other (See Comments)    unknown   Penicillins Other (See Comments)    Did it involve swelling of the face/tongue/throat, SOB, or low BP? Unknown Did it involve sudden or severe rash/hives, skin peeling, or any reaction on the inside of your mouth or nose? Unknown Did you need to seek medical attention at a hospital or doctor's office?  Unknown When did it last happen?      Childhood If all above answers are "NO", may proceed with cephalosporin use.     Past Medical History:  Diagnosis Date   Allergy    Bone spur    Left Shoulder   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 ([place]) 07/18/2017   Fracture    right forearm   Gout    Hyperlipidemia    Hypertension    Other testicular hypofunction    PONV (postoperative nausea and vomiting)    Type II or unspecified type diabetes mellitus without mention of complication, not stated as uncontrolled      Health Maintenance  Topic Date Due   COVID-19 Vaccine (1) Never done   Zoster Vaccines- Shingrix (1 of 2) Never done   OPHTHALMOLOGY EXAM  07/21/2018   HEMOGLOBIN A1C  07/10/2020   COLONOSCOPY (Pts 45-57yr Insurance coverage will need to be confirmed)  11/07/2020   FOOT EXAM  01/07/2021   INFLUENZA VACCINE  01/18/2021   TETANUS/TDAP  12/26/2022   Hepatitis C Screening  Completed   PNA vac Low Risk Adult  Completed   HPV VACCINES  Aged Out     Immunization History  Administered Date(s) Administered   PPD Test 02/07/2014, 02/16/2015   Pneumococcal Conjugate-13 04/12/2017   Pneumococcal Polysaccharide-23 11/01/2010, 05/29/2018   Td 12/25/2012   Zoster, Live 12/25/2012    Last Colon -   Past Surgical History:  Procedure Laterality Date   CYST REMOVAL HAND     ORIF RADIAL FRACTURE Right 07/18/2019   Procedure: OPEN REDUCTION INTERNAL FIXATION (ORIF) RADIUS AND ULNA FRACTURE;  Surgeon: HShona Needles MD;  Location[place] [facility]  Service: Orthopedics;  Laterality: Right;   PROSTATE BIOPSY  2010   WRIST SURGERY Right 1998     Family History  Problem Relation Age of Onset   Heart disease Father    Kidney disease Father    Heart failure Father    Heart disease Mother 879      stent   Colon cancer Neg Hx    Esophageal cancer Neg Hx    Liver cancer Neg Hx    Pancreatic cancer Neg Hx    Rectal cancer Neg Hx    Stomach cancer Neg Hx     Social History    Socioeconomic History   Marital status: Single    Spouse name: Not on file   Number of children: Not on file   Years of education: Not on file   Highest education level: Not on file  Occupational History   Not on file  Tobacco Use   Smoking status: Never   Smokeless tobacco: Never  Vaping Use  Vaping Use: Never used  Substance and Sexual Activity   Alcohol use: Not Currently   Drug use: No   Sexual activity: Not on file  Other Topics Concern   Not on file  Social History Narrative   Not on file   Social Determinants of Health   Financial Resource Strain: Not on file  Food Insecurity: Not on file  Transportation Needs: Not on file  Physical Activity: Not on file  Stress: Not on file  Social Connections: Not on file  Intimate Partner Violence: Not on file     ROS Constitutional: Denies fever, chills, weight loss/gain, headaches, insomnia,  night sweats or change in appetite. Does c/o fatigue. Eyes: Denies redness, blurred vision, diplopia, discharge, itchy or watery eyes.  ENT: Denies discharge, congestion, post nasal drip, epistaxis, sore throat, earache, hearing loss, dental pain, Tinnitus, Vertigo, Sinus pain or snoring.  Cardio: Denies chest pain, palpitations, irregular heartbeat, syncope, dyspnea, diaphoresis, orthopnea, PND, claudication or edema Respiratory: denies cough, dyspnea, DOE, pleurisy, hoarseness, laryngitis or wheezing.  Gastrointestinal: Denies dysphagia, heartburn, reflux, water brash, pain, cramps, nausea, vomiting, bloating, diarrhea, constipation, hematemesis, melena, hematochezia, jaundice or hemorrhoids Genitourinary: Denies dysuria, frequency, urgency, nocturia, hesitancy, discharge, hematuria or flank pain Musculoskeletal: Denies arthralgia, myalgia, stiffness, Jt. Swelling, pain, limp or strain/sprain. Denies Falls. Skin: Denies puritis, rash, hives, warts, acne, eczema or change in skin lesion Neuro: No weakness, tremor, incoordination,  spasms, paresthesia or pain Psychiatric: Denies confusion, memory loss or sensory loss. Denies Depression. Endocrine: Denies change in weight, skin, hair change, nocturia, and paresthesia, diabetic polys, visual blurring or hyper / hypo glycemic episodes.  Heme/Lymph: No excessive bleeding, bruising or enlarged lymph nodes.   Physical Exam  There were no vitals taken for this visit.  General Appearance: Well nourished and well groomed and in no apparent distress.  Eyes: PERRLA, EOMs, conjunctiva no swelling or erythema, normal fundi and vessels. Sinuses: No frontal/maxillary tenderness ENT/Mouth: EACs patent / TMs  nl. Nares clear without erythema, swelling, mucoid exudates. Oral hygiene is good. No erythema, swelling, or exudate. Tongue normal, non-obstructing. Tonsils not swollen or erythematous. Hearing normal.  Neck: Supple, thyroid not palpable. No bruits, nodes or JVD. Respiratory: Respiratory effort normal.  BS equal and clear bilateral without rales, rhonci, wheezing or stridor. Cardio: Heart sounds are normal with regular rate and rhythm and no murmurs, rubs or gallops. Peripheral pulses are normal and equal bilaterally without edema. No aortic or femoral bruits. Chest: symmetric with normal excursions and percussion.  Abdomen: Soft, with Nl bowel sounds. Nontender, no guarding, rebound, hernias, masses, or organomegaly.  Lymphatics: Non tender without lymphadenopathy.  Musculoskeletal: Full ROM all peripheral extremities, joint stability, 5/5 strength, and normal gait. Skin: Warm and dry without rashes, lesions, cyanosis, clubbing or  ecchymosis.  Neuro: Cranial nerves intact, reflexes equal bilaterally. Normal muscle tone, no cerebellar symptoms. Sensation intact.  Pysch: Alert and oriented X 3 with normal affect, insight and judgment appropriate.   Assessment and Plan  1. Annual Preventative/Screening Exam            Patient was counseled in prudent diet, weight control to  achieve/maintain BMI less than 25, BP monitoring, regular exercise and medications as discussed.  Discussed med effects and SE's. Routine screening labs and tests as requested with regular follow-up as recommended. Over 40 minutes of exam, counseling, chart review and high complex critical decision making was performed   Kirtland Bouchard, MD

## 2020-12-03 ENCOUNTER — Other Ambulatory Visit: Payer: Self-pay | Admitting: Internal Medicine

## 2020-12-03 LAB — CBC WITH DIFFERENTIAL/PLATELET
Absolute Monocytes: 389 cells/uL (ref 200–950)
Basophils Absolute: 71 cells/uL (ref 0–200)
Basophils Relative: 1.2 %
Eosinophils Absolute: 218 cells/uL (ref 15–500)
Eosinophils Relative: 3.7 %
HCT: 44.6 % (ref 38.5–50.0)
Hemoglobin: 14.7 g/dL (ref 13.2–17.1)
Lymphs Abs: 1322 cells/uL (ref 850–3900)
MCH: 27.8 pg (ref 27.0–33.0)
MCHC: 33 g/dL (ref 32.0–36.0)
MCV: 84.3 fL (ref 80.0–100.0)
MPV: 9.2 fL (ref 7.5–12.5)
Monocytes Relative: 6.6 %
Neutro Abs: 3900 cells/uL (ref 1500–7800)
Neutrophils Relative %: 66.1 %
Platelets: 291 10*3/uL (ref 140–400)
RBC: 5.29 10*6/uL (ref 4.20–5.80)
RDW: 13.3 % (ref 11.0–15.0)
Total Lymphocyte: 22.4 %
WBC: 5.9 10*3/uL (ref 3.8–10.8)

## 2020-12-03 LAB — COMPLETE METABOLIC PANEL WITH GFR
AG Ratio: 1.9 (calc) (ref 1.0–2.5)
ALT: 20 U/L (ref 9–46)
AST: 22 U/L (ref 10–35)
Albumin: 4.7 g/dL (ref 3.6–5.1)
Alkaline phosphatase (APISO): 60 U/L (ref 35–144)
BUN/Creatinine Ratio: 16 (calc) (ref 6–22)
BUN: 26 mg/dL — ABNORMAL HIGH (ref 7–25)
CO2: 25 mmol/L (ref 20–32)
Calcium: 9.6 mg/dL (ref 8.6–10.3)
Chloride: 104 mmol/L (ref 98–110)
Creat: 1.63 mg/dL — ABNORMAL HIGH (ref 0.70–1.25)
GFR, Est African American: 49 mL/min/{1.73_m2} — ABNORMAL LOW (ref >=60)
GFR, Est Non African American: 42 mL/min/{1.73_m2} — ABNORMAL LOW (ref >=60)
Globulin: 2.5 g/dL (calc) (ref 1.9–3.7)
Glucose, Bld: 140 mg/dL — ABNORMAL HIGH (ref 65–99)
Potassium: 5 mmol/L (ref 3.5–5.3)
Sodium: 139 mmol/L (ref 135–146)
Total Bilirubin: 0.8 mg/dL (ref 0.2–1.2)
Total Protein: 7.2 g/dL (ref 6.1–8.1)

## 2020-12-03 LAB — URINALYSIS, ROUTINE W REFLEX MICROSCOPIC
Bilirubin Urine: NEGATIVE
Glucose, UA: NEGATIVE
Hgb urine dipstick: NEGATIVE
Ketones, ur: NEGATIVE
Leukocytes,Ua: NEGATIVE
Nitrite: NEGATIVE
Protein, ur: NEGATIVE
Specific Gravity, Urine: 1.013 (ref 1.001–1.035)
pH: 6 (ref 5.0–8.0)

## 2020-12-03 LAB — TSH: TSH: 1.56 mIU/L (ref 0.40–4.50)

## 2020-12-03 LAB — LIPID PANEL
Cholesterol: 196 mg/dL (ref <200)
HDL: 37 mg/dL — ABNORMAL LOW (ref >=40)
LDL Cholesterol (Calc): 133 mg/dL (calc) — ABNORMAL HIGH
Non-HDL Cholesterol (Calc): 159 mg/dL (calc) — ABNORMAL HIGH (ref <130)
Total CHOL/HDL Ratio: 5.3 (calc) — ABNORMAL HIGH (ref <5.0)
Triglycerides: 150 mg/dL — ABNORMAL HIGH (ref <150)

## 2020-12-03 LAB — PTH, INTACT AND CALCIUM
Calcium: 9.6 mg/dL (ref 8.6–10.3)
PTH: 48 pg/mL (ref 16–77)

## 2020-12-03 LAB — VITAMIN D 25 HYDROXY (VIT D DEFICIENCY, FRACTURES): Vit D, 25-Hydroxy: 74 ng/mL (ref 30–100)

## 2020-12-03 LAB — INSULIN, RANDOM: Insulin: 30.4 u[IU]/mL — ABNORMAL HIGH

## 2020-12-03 LAB — MICROALBUMIN / CREATININE URINE RATIO
Creatinine, Urine: 70 mg/dL (ref 20–320)
Microalb Creat Ratio: 3 mcg/mg creat (ref <30)
Microalb, Ur: 0.2 mg/dL

## 2020-12-03 LAB — PSA: PSA: 2.3 ng/mL (ref <=4.00)

## 2020-12-03 LAB — HEMOGLOBIN A1C
Hgb A1c MFr Bld: 6 % of total Hgb — ABNORMAL HIGH (ref <5.7)
Mean Plasma Glucose: 126 mg/dL
eAG (mmol/L): 7 mmol/L

## 2020-12-03 LAB — MAGNESIUM: Magnesium: 1.8 mg/dL (ref 1.5–2.5)

## 2020-12-03 MED ORDER — LIVALO 2 MG PO TABS: ORAL_TABLET | ORAL | 1 refills | Status: DC

## 2020-12-04 NOTE — Progress Notes (Signed)
Pt was called to discuss his lab results. Pt stated he understood and will try to follow the doctor instructions. 12/14/20 DG/[facility]

## 2021-01-21 ENCOUNTER — Other Ambulatory Visit: Payer: Self-pay

## 2021-01-21 MED ORDER — ENALAPRIL MALEATE 20 MG PO TABS: ORAL_TABLET | ORAL | 0 refills | Status: DC

## 2021-02-14 ENCOUNTER — Encounter: Payer: Self-pay | Admitting: Gastroenterology

## 2021-02-16 ENCOUNTER — Other Ambulatory Visit: Payer: Self-pay | Admitting: Internal Medicine

## 2021-02-16 DIAGNOSIS — E1140 Type 2 diabetes mellitus with diabetic neuropathy, unspecified: Secondary | ICD-10-CM

## 2021-02-16 MED ORDER — GABAPENTIN 600 MG PO TABS: ORAL_TABLET | ORAL | 3 refills | Status: DC

## 2021-03-10 ENCOUNTER — Ambulatory Visit: Payer: PPO | Admitting: Nurse Practitioner

## 2021-04-28 ENCOUNTER — Ambulatory Visit: Payer: PPO | Admitting: Nurse Practitioner

## 2021-05-19 ENCOUNTER — Telehealth: Payer: Self-pay | Admitting: Internal Medicine

## 2021-05-19 NOTE — Chronic Care Management (AMB) (Signed)
  Chronic Care Management   Note  05/19/2021 Name: [name] MRN: [id number] DOB: 06-09-1951  [name] is a 70 y.o. year old male who is a primary care patient of Unk Pinto, MD. I reached out to Artis Flock by phone today in response to a referral sent by [name]'s PCP, Unk Pinto, MD.   [name] was given information about Chronic Care Management services today including:  CCM service includes personalized support from designated clinical staff supervised by his physician, including individualized plan of care and coordination with other care providers 24/7 contact phone numbers for assistance for urgent and routine care needs. Service will only be billed when office clinical staff spend 20 minutes or more in a month to coordinate care. Only one practitioner may furnish and bill the service in a calendar month. The patient may stop CCM services at any time (effective at the end of the month) by phone call to the office staff.   Patient agreed to services and verbal consent obtained.   Follow up plan:   [name]

## 2021-05-27 ENCOUNTER — Other Ambulatory Visit: Payer: Self-pay | Admitting: Internal Medicine

## 2021-05-27 ENCOUNTER — Telehealth: Payer: Self-pay

## 2021-05-27 DIAGNOSIS — M1000 Idiopathic gout, unspecified site: Secondary | ICD-10-CM

## 2021-05-27 NOTE — Telephone Encounter (Signed)
The patient reports that he has not taken his Allopurinol in a long time due to it upsetting his stomach and causing diarrhea. He reports he has not had a gout attack in ten years. He also reports that he has not taken any Allopurinol during this suspected attack of gout. It was explained to the patient that had he been taking his Allopurinol then it would be less likely for him to have a recurrent gout attack and easier to rule out gout vs neuropathy vs arthritis. The patient voiced verbal understanding and reports he will be in this office for lab work tomorrow.

## 2021-05-28 ENCOUNTER — Other Ambulatory Visit: Payer: Self-pay

## 2021-05-28 ENCOUNTER — Other Ambulatory Visit: Payer: PPO

## 2021-05-28 DIAGNOSIS — M1000 Idiopathic gout, unspecified site: Secondary | ICD-10-CM | POA: Diagnosis not present

## 2021-05-29 ENCOUNTER — Other Ambulatory Visit: Payer: Self-pay | Admitting: Internal Medicine

## 2021-05-29 LAB — URIC ACID: Uric Acid, Serum: 9.9 mg/dL — ABNORMAL HIGH (ref 4.0–8.0)

## 2021-05-29 MED ORDER — ALLOPURINOL 300 MG PO TABS: ORAL_TABLET | ORAL | 3 refills | Status: DC

## 2021-05-29 NOTE — Progress Notes (Signed)
============================================================ -   Test results slightly outside the reference range are not unusual. If there is anything important, I will review this with you,  otherwise it is considered normal test values.  If you have further questions,  please do not hesitate to contact me at the office or via My Chart.  ============================================================ ============================================================  -  Uric Acid / Gout crystal test is very elevated like it was a year ago when   you were prescribed  (but never got) the medicine to lower your uric acid level.   - So re-sent Rx for Allopurinol to your drugstore

## 2021-05-31 ENCOUNTER — Other Ambulatory Visit: Payer: Self-pay | Admitting: Internal Medicine

## 2021-06-08 NOTE — Progress Notes (Signed)
MEDICARE ANNUAL WELLNESS VISIT AND FOLLOW UP Assessment:    Encounter for Medicare annual wellness exam Refuses covid vaccine, shingles vaccine, influenza vaccine  Due Annually  Health Maintenance Reviewed  Healthy lifestyle reviewed and goals set   Essential hypertension Enalapril 67m daily- checks blood pressure and takes half if running less than 100/60 Monitor blood pressure at home; call if consistently over 130/80 Continue DASH diet.   Reminder to go to the ER if any CP, SOB, nausea, dizziness, severe HA, changes vision/speech, left arm numbness and tingling and jaw pain. - CBC  Type 2 diabetes mellitus with stage 3 chronic kidney disease, without long-term current use of insulin ([place] Type 2 Diabetes Mellitus Education: Reviewed ABCs of diabetes management (respective goals in parentheses):  A1C (<7), blood pressure (<130/80), and cholesterol (LDL <70) Eye Exam yearly and Dental Exam every 6 months Dietary recommendations PATIENT ONLY ON 2 METFORMIN A DAY WITH WEIGHT LOSS Physical Activity recommendations - CMP -     Hemoglobin A1c  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 (HCC) Increase fluids, avoid NSAIDS, monitor sugars, will monitor -     CMP WITH GFR  Mixed hyperlipidemia Fairly controlled on zetia; has been intolerant of several statins May benefit from lipid clinic referral in the future Continue low cholesterol diet and exercise.  -     Lipid panel -     TSH  Testosterone deficiency Has opted against supplementation Zinc 50 mg daily supplement, high-intensity interval exercises  Idiopathic gout, unspecified chronicity, unspecified site Continue allopurinol Diet discussed Had recent flare at beginning of December, has resolved  Vitamin D deficiency Below goal at last check; he has not  increased dose; goal is 70-100 He will increase dose this visit -  Defer vitamin D check until next visit  Medication management -     CBC with  Differential/Platelet -     CMP  Diabetic polyneuropathy associated with type 2 diabetes mellitus ([place] Continue gabapentin Continue weight loss  Statin MYOPATHY Refuses cholesterol medication  Acute right knee pain Monitor and if pain worsens will refer to orthopedics Declines imaging today  Over 30 minutes of exam, counseling, chart review, and critical decision making was performed  Future Appointments  Date Time Provider [place] 06/30/2021  9:00 AM [place] Pigg [place] None  12/02/2021 11:00 AM MUnk Pinto MD [facility] None  06/10/2022  9:00 AM MMagda Bernheim NP [facility] None     Plan:   During the course of the visit the patient was educated and counseled about appropriate screening and preventive services including:   Pneumococcal vaccine  Influenza vaccine Prevnar 13 Td vaccine Screening electrocardiogram Colorectal cancer screening Diabetes screening Glaucoma screening Nutrition counseling    Subjective:  Wayne Trawickis a 70y.o. male who presents for Medicare Annual Wellness Visit and 3 month follow up for HTN, hyperlipidemia, T2DM with CKD 3, and vitamin D Def.   Had recent positive cologuard, had colonoscopy 10/2017, recall 3 years. Due now . Knows he is to schedule but has been having a lot of issues with his mom who is 90.  Right handed, had left shoulder surgery with Dr. DRhona Raider states ROM is better but still with tingling in neck. Has not had Xray of his neck. Had a fall in Jan resulting in open reduction internal fixation of radius and ulna on right side. He also had right shoulder pain, had rotator cuff tear, had injection and he is doing better. States it  is not bothering him currently  [place] down on both knees onto the concrete 6 weeks ago. Right knee is having some numbness and pain in the medial aspect.  Does not want to do any further intervention currently.   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 He is going to 2 days a week in the new year.  He continues to do a lot of fishing.   BMI is Body mass index is 24.16 kg/m., he has been working on diet- avoiding sweets and soda. Increased water, continues to exercise.  Wt Readings from Last 3 Encounters:  06/10/21 163 lb 9.6 oz (74.2 kg)  12/02/20 159 lb 3.2 oz (72.2 kg)  01/08/20 166 lb (75.3 kg)   His blood pressure has been controlled at home, today their BP is BP: 124/68. He is checking his blood pressure and running normal , taking enalapril 20 mg daily, will cut in half if it gets too low  BP Readings from Last 3 Encounters:  06/10/21 124/68  12/02/20 136/70  01/08/20 110/80    He does not workout - but walks extensively at home and fishes on his days off.  He denies chest pain, shortness of breath, dizziness.   He is not on cholesterol medication (intolerant of several statins) and denies myalgias. His cholesterol is not at goal. The cholesterol last visit was:   Lab Results  Component Value Date   CHOL 196 12/02/2020   HDL 37 (L) 12/02/2020   LDLCALC 133 (H) 12/02/2020   TRIG 150 (H) 12/02/2020   CHOLHDL 5.3 (H) 12/02/2020   He has been working on diet and exercise for T2 diabetes With hyperlipdiemia- has statin myopathy With CKD With neuropathy on gabapentin HE DECREASED TO 1 IN THE AM AND 1 NIGHT and denies increased appetite, nausea, paresthesia of the feet, polydipsia, polyuria, visual disturbances, vomiting and weight loss Fasting blood sugars are running 90-120 Last A1C in the office was:  Lab Results  Component Value Date   HGBA1C 6.0 (H) 12/02/2020   Last GFR Lab Results  Component Value Date   GFRNONAA 42 (L) 12/02/2020    Patient is on Vitamin D supplement but remained below goal at most recent check:    Lab Results  Component Value Date   VD25OH 74 12/02/2020     Patient is on allopurinol for gout and does not report a recent flare.  Lab Results  Component Value Date   LABURIC 9.9 (H)  05/28/2021     Medication Review:  Current Outpatient Medications (Endocrine & Metabolic):    metFORMIN (GLUCOPHAGE-XR) 500 MG 24 hr tablet, TAKE TWO TABLETS BY MOUTH TWICE A DAY WITH MEALS FOR DIABETES  Current Outpatient Medications (Cardiovascular):    enalapril (VASOTEC) 20 MG tablet, TAKE ONE TABLET BY MOUTH DAILY FOR BLOOD PRESSURE AND DIABETIC KIDNEY PROTECTION   Pitavastatin Calcium (LIVALO) 2 MG TABS, Take 1 tablet  Daily  for Cholesterol   Current Outpatient Medications (Analgesics):    allopurinol (ZYLOPRIM) 300 MG tablet, Take 1 tablet Daily to Prevent Gout   BABY ASPIRIN PO, Take 81 mg by mouth daily.   Current Outpatient Medications (Other):    Cholecalciferol (VITAMIN D PO), Take 5,000 Int'l Units by mouth daily.   gabapentin (NEURONTIN) 600 MG tablet, Take  1/2 to 1 tablet 3 x /day  for Neuropathy pain / Patient knows to take by mouth   glucose blood (FREESTYLE TEST STRIPS) test strip, CHECK BLOOD SUGAR 1 TIME DAILY-DX-E11.22  glucose monitoring kit (FREESTYLE) monitoring kit, Check blood sugar 1 time daily-DX-E11.22.   Lancets (FREESTYLE) lancets, CHECK BLOOD SUGAR 1 TIME DAILY-DX-E11.22  Allergies: Allergies  Allergen Reactions   Crestor [Rosuvastatin] Diarrhea   Welchol [Colesevelam Hcl] Diarrhea   Zoloft [Sertraline Hcl] Other (See Comments)    unknown   Penicillins Other (See Comments)    Current Problems (verified) has Hyperlipidemia associated with type 2 diabetes mellitus ([place]); Hypertension; Type 2 diabetes mellitus with stage 3 chronic kidney disease ([place]); Testosterone deficiency; Gout; Vitamin D deficiency; Medication management; 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 ([place]); Diabetes, polyneuropathy ([place]); Statin intolerance; Closed fracture of middle of right radius and ulna; Statin myopathy; and Senile purpura ([place]) on their problem list.  Screening Tests Immunization History  Administered Date(s) Administered   PPD Test 02/07/2014,  02/16/2015   Pneumococcal Conjugate-13 04/12/2017   Pneumococcal Polysaccharide-23 11/01/2010, 05/29/2018   Td 12/25/2012   Zoster, Live 12/25/2012   Health Maintenance  Topic Date Due   COVID-19 Vaccine (1) Never done   Zoster Vaccines- Shingrix (1 of 2) Never done   OPHTHALMOLOGY EXAM  07/21/2018   COLONOSCOPY (Pts 45-55yr Insurance coverage will need to be confirmed)  11/07/2020   INFLUENZA VACCINE  Never done   HEMOGLOBIN A1C  06/03/2021   FOOT EXAM  12/02/2021   TETANUS/TDAP  12/26/2022   Pneumonia Vaccine 70 Years old  Completed   Hepatitis C Screening  Completed   HPV VACCINES  Aged Out    Preventative care: Last colonoscopy: 10/2017, due 3 years + adenoma  cologuard + sent for colonoscopy Due Now will schedule  Names of Other Physician/Practitioners you currently use: 1. [facility] here for primary care 2. Dr. HHerbert Deaner eye doctor, last visit 2019- OVERDUE 3. Dr. BGloriann Loan dentist, last visit 2022  Patient Care Team: MUnk Pinto MD as PCP - General (Internal Medicine) [facility] (Pharmacist)  Surgical: He  has a past surgical history that includes Wrist surgery (Right, 1998); Prostate biopsy (2010); Cyst removal hand; and ORIF radial fracture (Right, 07/18/2019). Family His family history includes Heart disease in his father; Heart disease (age of onset: 833 in his mother; Heart failure in his father; Kidney disease in his father. Social history  He reports that he has never smoked. He has never used smokeless tobacco. He reports that he does not currently use alcohol. He reports that he does not use drugs.  MEDICARE WELLNESS OBJECTIVES: Physical activity: Current Exercise Habits: Home exercise routine, Type of exercise: walking, Time (Minutes): 45, Frequency (Times/Week): 3, Weekly Exercise (Minutes/Week): 135, Intensity: Mild, Exercise limited by: None identified Cardiac risk factors: Cardiac Risk  Factors include: advanced age (>546m, >6>25omen);diabetes mellitus;dyslipidemia;hypertension;male gender Depression/mood screen:   [facility]/10[facility]  Decreased Interest 0  Down, Depressed, Hopeless 0  PHQ - 2 Score 0    ADLs:  In your present state of health, do you have any difficulty performing the following activities: 06/10/2021 12/13/2020  Hearing? N N  Vision? N N  Difficulty concentrating or making decisions? N N  Walking or climbing stairs? N N  Dressing or bathing? N N  Doing errands, shopping? N N  Some recent data might be hidden     Cognitive Testing  Alert? Yes  Normal Appearance?Yes  Oriented to person? Yes  Place? Yes   Time? Yes  Recall of three objects?  Yes  Can perform simple calculations? Yes  Displays appropriate judgment?Yes  Can  read the correct time from a watch face?Yes  EOL planning: Does Patient Have a Medical Advance Directive?: No Would patient like information on creating a medical advance directive?: No - Patient declined  Review of Systems  Constitutional:  Negative for malaise/fatigue and weight loss.  HENT:  Negative for hearing loss and tinnitus.   Eyes:  Negative for blurred vision and double vision.  Respiratory:  Negative for cough, shortness of breath and wheezing.   Cardiovascular:  Negative for chest pain, palpitations, orthopnea, claudication and leg swelling.  Gastrointestinal:  Negative for abdominal pain, blood in stool, constipation, diarrhea, heartburn, melena, nausea and vomiting.  Genitourinary: Negative.   Musculoskeletal:  Negative for joint pain and myalgias.  Skin:  Negative for rash.  Neurological:  Negative for dizziness, tingling, sensory change, weakness and headaches.  Endo/Heme/Allergies:  Negative for polydipsia.  Psychiatric/Behavioral: Negative.    All other systems reviewed and are negative.   Objective:   Today's Vitals   06/10/21 0856  BP: 124/68  Pulse: 75  Temp: 97.7 F (36.5 C)   SpO2: 99%  Weight: 163 lb 9.6 oz (74.2 kg)   Body mass index is 24.16 kg/m.  General appearance: alert, no distress, WD/WN, male HEENT: normocephalic, sclerae anicteric, TMs pearly, nares patent, no discharge or erythema, pharynx normal Oral cavity: MMM, no lesions Neck: supple, no lymphadenopathy, no thyromegaly, no masses Heart: RRR, normal S1, S2, no murmurs Lungs: CTA bilaterally, no wheezes, rhonchi, or rales Abdomen: +bs, soft, non tender, non distended, no masses, no hepatomegaly, no splenomegaly Musculoskeletal: nontender, no swelling, no obvious deformity Extremities: no edema, no cyanosis, no clubbing Pulses: 2+ symmetric, upper and lower extremities, normal cap refill Neurological: alert, oriented x 3, CN2-12 intact, strength normal upper extremities and lower extremities, sensation normal throughout, DTRs 2+ throughout, no cerebellar signs, gait normal Psychiatric: normal affect, behavior normal, pleasant   Medicare Attestation I have personally reviewed: The patient's medical and social history Their use of alcohol, tobacco or illicit drugs Their current medications and supplements The patient's functional ability including ADLs,fall risks, home safety risks, cognitive, and hearing and visual impairment Diet and physical activities Evidence for depression or mood disorders  The patient's weight, height, BMI, and visual acuity have been recorded in the chart.  I have made referrals, counseling, and provided education to the patient based on review of the above and I have provided the patient with a written personalized care plan for preventive services.     Magda Bernheim, NP   06/10/2021

## 2021-06-09 ENCOUNTER — Ambulatory Visit: Payer: PPO | Admitting: Internal Medicine

## 2021-06-10 ENCOUNTER — Encounter: Payer: Self-pay | Admitting: Nurse Practitioner

## 2021-06-10 ENCOUNTER — Other Ambulatory Visit: Payer: Self-pay

## 2021-06-10 ENCOUNTER — Ambulatory Visit (INDEPENDENT_AMBULATORY_CARE_PROVIDER_SITE_OTHER): Payer: PPO | Admitting: Nurse Practitioner

## 2021-06-10 VITALS — BP 124/68 | HR 75 | Temp 97.7°F | Wt 163.6 lb

## 2021-06-10 DIAGNOSIS — Z79899 Other long term (current) drug therapy: Secondary | ICD-10-CM

## 2021-06-10 DIAGNOSIS — M25561 Pain in right knee: Secondary | ICD-10-CM

## 2021-06-10 DIAGNOSIS — E1140 Type 2 diabetes mellitus with diabetic neuropathy, unspecified: Secondary | ICD-10-CM

## 2021-06-10 DIAGNOSIS — E559 Vitamin D deficiency, unspecified: Secondary | ICD-10-CM

## 2021-06-10 DIAGNOSIS — I10 Essential (primary) hypertension: Secondary | ICD-10-CM | POA: Diagnosis not present

## 2021-06-10 DIAGNOSIS — E785 Hyperlipidemia, unspecified: Secondary | ICD-10-CM | POA: Diagnosis not present

## 2021-06-10 DIAGNOSIS — R6889 Other general symptoms and signs: Secondary | ICD-10-CM | POA: Diagnosis not present

## 2021-06-10 DIAGNOSIS — Z0001 Encounter for general adult medical examination with abnormal findings: Secondary | ICD-10-CM

## 2021-06-10 DIAGNOSIS — N1831 Chronic kidney disease, stage 3a: Secondary | ICD-10-CM | POA: Diagnosis not present

## 2021-06-10 DIAGNOSIS — E1122 Type 2 diabetes mellitus with diabetic chronic kidney disease: Secondary | ICD-10-CM | POA: Diagnosis not present

## 2021-06-10 DIAGNOSIS — E1169 Type 2 diabetes mellitus with other specified complication: Secondary | ICD-10-CM

## 2021-06-10 DIAGNOSIS — E349 Endocrine disorder, unspecified: Secondary | ICD-10-CM

## 2021-06-10 DIAGNOSIS — M1000 Idiopathic gout, unspecified site: Secondary | ICD-10-CM | POA: Diagnosis not present

## 2021-06-10 DIAGNOSIS — Z0000 Encounter for general adult medical examination without abnormal findings: Secondary | ICD-10-CM

## 2021-06-10 DIAGNOSIS — G720 Drug-induced myopathy: Secondary | ICD-10-CM | POA: Diagnosis not present

## 2021-06-10 DIAGNOSIS — N183 Chronic kidney disease, stage 3 unspecified: Secondary | ICD-10-CM

## 2021-06-10 NOTE — Patient Instructions (Signed)

## 2021-06-11 LAB — LIPID PANEL
Cholesterol: 220 mg/dL — ABNORMAL HIGH (ref <200)
HDL: 40 mg/dL (ref >=40)
LDL Cholesterol (Calc): 145 mg/dL (calc) — ABNORMAL HIGH
Non-HDL Cholesterol (Calc): 180 mg/dL (calc) — ABNORMAL HIGH (ref <130)
Total CHOL/HDL Ratio: 5.5 (calc) — ABNORMAL HIGH (ref <5.0)
Triglycerides: 213 mg/dL — ABNORMAL HIGH (ref <150)

## 2021-06-11 LAB — CBC WITH DIFFERENTIAL/PLATELET
Absolute Monocytes: 619 cells/uL (ref 200–950)
Basophils Absolute: 68 cells/uL (ref 0–200)
Basophils Relative: 1 %
Eosinophils Absolute: 333 cells/uL (ref 15–500)
Eosinophils Relative: 4.9 %
HCT: 44.3 % (ref 38.5–50.0)
Hemoglobin: 14.8 g/dL (ref 13.2–17.1)
Lymphs Abs: 1510 cells/uL (ref 850–3900)
MCH: 27.9 pg (ref 27.0–33.0)
MCHC: 33.4 g/dL (ref 32.0–36.0)
MCV: 83.4 fL (ref 80.0–100.0)
MPV: 9.3 fL (ref 7.5–12.5)
Monocytes Relative: 9.1 %
Neutro Abs: 4270 cells/uL (ref 1500–7800)
Neutrophils Relative %: 62.8 %
Platelets: 272 10*3/uL (ref 140–400)
RBC: 5.31 10*6/uL (ref 4.20–5.80)
RDW: 13.1 % (ref 11.0–15.0)
Total Lymphocyte: 22.2 %
WBC: 6.8 10*3/uL (ref 3.8–10.8)

## 2021-06-11 LAB — COMPLETE METABOLIC PANEL WITH GFR
AG Ratio: 2.3 (calc) (ref 1.0–2.5)
ALT: 26 U/L (ref 9–46)
AST: 32 U/L (ref 10–35)
Albumin: 5 g/dL (ref 3.6–5.1)
Alkaline phosphatase (APISO): 72 U/L (ref 35–144)
BUN/Creatinine Ratio: 13 (calc) (ref 6–22)
BUN: 20 mg/dL (ref 7–25)
CO2: 28 mmol/L (ref 20–32)
Calcium: 9.9 mg/dL (ref 8.6–10.3)
Chloride: 103 mmol/L (ref 98–110)
Creat: 1.56 mg/dL — ABNORMAL HIGH (ref 0.70–1.28)
Globulin: 2.2 g/dL (calc) (ref 1.9–3.7)
Glucose, Bld: 76 mg/dL (ref 65–99)
Potassium: 4.5 mmol/L (ref 3.5–5.3)
Sodium: 141 mmol/L (ref 135–146)
Total Bilirubin: 0.6 mg/dL (ref 0.2–1.2)
Total Protein: 7.2 g/dL (ref 6.1–8.1)
eGFR: 47 mL/min/{1.73_m2} — ABNORMAL LOW (ref >=60)

## 2021-06-11 LAB — HEMOGLOBIN A1C
Hgb A1c MFr Bld: 6.2 % of total Hgb — ABNORMAL HIGH (ref <5.7)
Mean Plasma Glucose: 131 mg/dL
eAG (mmol/L): 7.3 mmol/L

## 2021-06-11 LAB — TSH: TSH: 2.09 mIU/L (ref 0.40–4.50)

## 2021-06-15 ENCOUNTER — Other Ambulatory Visit: Payer: Self-pay | Admitting: Nurse Practitioner

## 2021-06-15 DIAGNOSIS — E785 Hyperlipidemia, unspecified: Secondary | ICD-10-CM

## 2021-06-15 MED ORDER — EZETIMIBE 10 MG PO TABS: 10.0000 mg | ORAL_TABLET | Freq: Every day | ORAL | 11 refills | Status: DC

## 2021-06-15 NOTE — Progress Notes (Signed)
zet

## 2021-06-30 ENCOUNTER — Telehealth: Payer: PPO | Admitting: Pharmacist

## 2021-08-27 ENCOUNTER — Other Ambulatory Visit: Payer: Self-pay | Admitting: Internal Medicine

## 2021-08-27 MED ORDER — ENALAPRIL MALEATE 20 MG PO TABS: ORAL_TABLET | ORAL | 3 refills | Status: DC

## 2021-08-28 ENCOUNTER — Other Ambulatory Visit: Payer: Self-pay | Admitting: Internal Medicine

## 2021-09-10 ENCOUNTER — Telehealth: Payer: Self-pay

## 2021-09-10 NOTE — Telephone Encounter (Signed)
LM-09/10/21-Called patient at [phone] and r/s inital cpp visit for 8/1 at 9:00AM *Phone visit* call [phone]  ? ?Total time spent: 5 min ?

## 2021-09-14 ENCOUNTER — Telehealth: Payer: PPO | Admitting: Pharmacist

## 2021-10-03 ENCOUNTER — Other Ambulatory Visit: Payer: Self-pay | Admitting: Internal Medicine

## 2021-10-03 MED ORDER — METFORMIN HCL ER 500 MG PO TB24
ORAL_TABLET | ORAL | 3 refills | Status: DC
Start: 2021-10-03 — End: 2022-03-16

## 2021-12-01 ENCOUNTER — Encounter: Payer: Self-pay | Admitting: Internal Medicine

## 2021-12-01 NOTE — Progress Notes (Signed)
Annual  Screening/Preventative Visit  & Comprehensive Evaluation & Examination  Future Appointments  Date Time Provider Department  12/02/2021                  CPE 11:00 AM Unk Pinto, MD [facility]  01/18/2022  9:00 AM Carleene Mains, [facility]  06/10/2022               Wellness  9:00 AM Alycia Rossetti, NP [facility]  12/07/2022                  CPE 11:00 AM Unk Pinto, MD [facility]            This very nice 71 y.o. WWM presents for a Screening /Preventative Visit & comprehensive evaluation and management of multiple medical co-morbidities.  Patient has been followed for HTN, HLD, T2_NIDDM  and Vitamin D Deficiency. Patient has hx/o Gout & is not currently on meds.       HTN predates circa 2000. Patient's BP has been controlled at home.  Today's BP is   138/86. Patient denies any cardiac symptoms as chest pain, palpitations, shortness of breath, dizziness or ankle swelling.       Patient is Statin Intolerant & his lipids are not controlled with diet and  since last visit he has stopped taking his Gemfibrozil & Ezetimibe.  Last lipids were not at goal:  Lab Results  Component Value Date   CHOL 213 (H) 12/02/2021   HDL 43 12/02/2021   LDLCALC 136 (H) 12/02/2021   TRIG 202 (H) 12/02/2021   CHOLHDL 5.0 (H) 12/02/2021         Patient has hx/o T2_NIDDM  (2002) w/CKD3a  (GFR 49)  and patient denies reactive hypoglycemic symptoms, visual blurring, diabetic polys, but does have paresthesias of his toes. Last A1c was not at goal:   Lab Results  Component Value Date   HGBA1C 6.2 (H) 12/02/2021         Finally, patient has history of Vitamin D Deficiency ("14" /2010) and last vitamin D was at goal:   Lab Results  Component Value Date   VD25OH 74 12/02/2020     Current Outpatient Medications on File Prior to Visit  Medication Sig   BABY ASPIRIN PO Take  daily.   VITAMIN D  5,000 Units  Take by mouth daily.   TOPICORT  0.25 % cream Apply  daily as needed  (Skin rash).    enalapril 20 MG tablet Take  1 tablet  Daily    gabapentin 600 MG tablet Take  1/2 to 1 tablet 3 x /day   metFORMIN -XR 500 MG  TAKE TWO TABLETS TWICE A DAY     Allergies  Allergen Reactions   Crestor [Rosuvastatin] Diarrhea   Welchol [Colesevelam Hcl] Diarrhea   Zoloft [Sertraline Hcl]     unknown   Penicillins     Past Medical History:  Diagnosis Date   Allergy    Bone spur    Left Shoulder   CKD stage 3a due to type 2 diabetes mellitus ([place]) 07/18/2017   Fracture    right forearm   Gout    Hyperlipidemia    Hypertension    Other testicular hypofunction    PONV (postoperative nausea and vomiting)    Type II  diabetes mellitus       Health Maintenance  Topic Date Due   COVID-19 Vaccine (1) Never done   Zoster Vaccines- Shingrix (1 of 2) Never  done   OPHTHALMOLOGY EXAM  07/21/2018   HEMOGLOBIN A1C  07/10/2020   COLONOSCOPY  11/07/2020   INFLUENZA VACCINE  01/18/2021   FOOT EXAM  12/02/2021   TETANUS/TDAP  12/26/2022   Hepatitis C Screening  Completed   PNA vac Low Risk Adult  Completed   HPV VACCINES  Aged Out    Immunization History  Administered Date(s) Administered   PPD Test 02/07/2014, 02/16/2015   Pneumococcal -13 04/12/2017   Pneumococcal -23 11/01/2010, 05/29/2018   Td 12/25/2012   Zoster, Live 12/25/2012    Last Colon - 11/07/2017 - Dr Loletha Carrow  - recc f/u Colon 3 years due may 2022 - (Overdue)                                                 - 8/22/222022 , Dr Loletha Carrow sent Reminder letter to schedule .  Past Surgical History:  Procedure Laterality Date   CYST REMOVAL HAND     ORIF RADIAL FRACTURE Right 07/18/2019   Procedure: OPEN REDUCTION INTERNAL FIXATION (ORIF) RADIUS AND ULNA FRACTURE;  Surgeon: Shona Needles, MD;  Location: [place];  Service: Orthopedics;  Laterality: Right;   PROSTATE BIOPSY  2010   WRIST SURGERY Right 1998     Family History  Problem Relation Age of Onset   Heart disease Father    Kidney disease Father     Heart failure Father    Heart disease Mother 29       stent   Colon cancer Neg Hx    Esophageal cancer Neg Hx    Liver cancer Neg Hx    Pancreatic cancer Neg Hx    Rectal cancer Neg Hx    Stomach cancer Neg Hx     Social History   Socioeconomic History   Marital status: Widowed   Number of children: 1 daughter  Occupational History   Works Interior and spatial designer  Tobacco Use   Smoking status: Never   Smokeless tobacco: Never  Vaping Use   Vaping Use: Never used  Substance and Sexual Activity   Alcohol use: Not Currently   Drug use: No   Sexual activity: Not on file      ROS Constitutional: Denies fever, chills, weight loss/gain, headaches, insomnia,  night sweats or change in appetite. Does c/o fatigue. Eyes: Denies redness, blurred vision, diplopia, discharge, itchy or watery eyes.  ENT: Denies discharge, congestion, post nasal drip, epistaxis, sore throat, earache, hearing loss, dental pain, Tinnitus, Vertigo, Sinus pain or snoring.  Cardio: Denies chest pain, palpitations, irregular heartbeat, syncope, dyspnea, diaphoresis, orthopnea, PND, claudication or edema Respiratory: denies cough, dyspnea, DOE, pleurisy, hoarseness, laryngitis or wheezing.  Gastrointestinal: Denies dysphagia, heartburn, reflux, water brash, pain, cramps, nausea, vomiting, bloating, diarrhea, constipation, hematemesis, melena, hematochezia, jaundice or hemorrhoids Genitourinary: Denies dysuria, frequency, urgency, nocturia, hesitancy, discharge, hematuria or flank pain Musculoskeletal: Denies arthralgia, myalgia, stiffness, Jt. Swelling, pain, limp or strain/sprain. Denies Falls. Skin: Denies puritis, rash, hives, warts, acne, eczema or change in skin lesion Neuro: No weakness, tremor, incoordination, spasms, paresthesia or pain Psychiatric: Denies confusion, memory loss or sensory loss. Denies Depression. Endocrine: Denies change in weight, skin, hair change, nocturia, and paresthesia, diabetic  polys, visual blurring or hyper / hypo glycemic episodes.  Heme/Lymph: No excessive bleeding, bruising or enlarged lymph nodes.   Physical Exam  BP 138/86   Pulse Marland Kitchen)  59   Temp (!) 96.9 F (36.1 C)   Resp 16   Ht 5' 8.5" (1.74 m)   Wt 160 lb 6.4 oz (72.8 kg)   SpO2 99%   BMI 24.03 kg/m   General Appearance: Well nourished and well groomed and in no apparent distress.  Eyes: PERRLA, EOMs, conjunctiva no swelling or erythema, normal fundi and vessels. Sinuses: No frontal/maxillary tenderness ENT/Mouth: EACs patent / TMs  nl. Nares clear without erythema, swelling, mucoid exudates. Oral hygiene is good. No erythema, swelling, or exudate. Tongue normal, non-obstructing. Tonsils not swollen or erythematous. Hearing normal.  Neck: Supple, thyroid not palpable. No bruits, nodes or JVD. Respiratory: Respiratory effort normal.  BS equal and clear bilateral without rales, rhonci, wheezing or stridor. Cardio: Heart sounds are normal with regular rate and rhythm and no murmurs, rubs or gallops. Peripheral pulses are normal and equal bilaterally without edema. No aortic or femoral bruits. Chest: symmetric with normal excursions and percussion.  Abdomen: Soft, with Nl bowel sounds. Nontender, no guarding, rebound, hernias, masses, or organomegaly.  Lymphatics: Non tender without lymphadenopathy.  Musculoskeletal: Full ROM all peripheral extremities, joint stability, 5/5 strength, and normal gait. Skin: Warm and dry without rashes, lesions, cyanosis, clubbing or  ecchymosis.  Neuro: Cranial nerves intact, reflexes equal bilaterally. Normal muscle tone, no cerebellar symptoms. Sensation intact.  Pysch: Alert and oriented X 3 with normal affect, insight and judgment appropriate.   Assessment and Plan  1. Annual Preventative/Screening Exam    2. Essential hypertension  - EKG 12-Lead - [place], RETROPERITNL ABD,  LTD - Urinalysis, Routine w reflex microscopic - Microalbumin / creatinine urine  ratio - CBC with Differential/Platelet - COMPLETE METABOLIC PANEL WITH GFR - Magnesium - TSH  3. Hyperlipidemia associated with type 2 diabetes mellitus (HCC)  - Lipid panel - TSH  4. Type 2 diabetes mellitus with stage 3a chronic kidney  disease, unspecified whether long term insulin use (HCC)  - Urinalysis, Routine w reflex microscopic - Microalbumin / creatinine urine ratio - HM DIABETES FOOT EXAM - LOW EXTREMITY NEUR EXAM DOCUM - PTH, intact and calcium - Hemoglobin A1c - Insulin, random  5. Vitamin D deficiency  - VITAMIN D 25 Hydroxy   6. Idiopathic gout   7. Diabetic neuropathy, painful ([place])  - HM DIABETES FOOT EXAM - LOW EXTREMITY NEUR EXAM DOCUM  8. Senile purpura (HCC)  - LOW EXTREMITY NEUR EXAM DOCUM  9. Statin myopathy   10. BPH with obstruction/lower urinary tract symptoms  - PSA  11. Elevated PSA  - PSA  12. Screening for colorectal cancer  - POC Hemoccult Bld/Stl   13. Screening for ischemic heart disease  - [place], RETROPERITNL ABD,  LTD  14. FHx: heart disease  - EKG 12-Lead - [place], RETROPERITNL ABD,  LTD - Urinalysis, Routine w reflex microscopic  15. Screening for AAA (aortic abdominal aneurysm)  - [place], RETROPERITNL ABD,  LTD  16. Medication management  - [place], RETROPERITNL ABD,  LTD - Urinalysis, Routine w reflex microscopic - CBC with Differential/Platelet - COMPLETE METABOLIC PANEL WITH GFR - Magnesium - Lipid panel - TSH - Hemoglobin A1c - Insulin, random - VITAMIN D 25 Hydroxy           Patient was counseled in prudent diet, weight control to achieve/maintain BMI less than 25, BP monitoring, regular exercise and medications as discussed.  Discussed med effects and SE's. Routine screening labs and tests as requested with regular follow-up as recommended. Over 40 minutes of exam, counseling,  chart review and high complex critical decision making was performed   [place] Bouchard, MD

## 2021-12-01 NOTE — Patient Instructions (Signed)

## 2021-12-02 ENCOUNTER — Encounter: Payer: Self-pay | Admitting: Internal Medicine

## 2021-12-02 ENCOUNTER — Ambulatory Visit (INDEPENDENT_AMBULATORY_CARE_PROVIDER_SITE_OTHER): Payer: PPO | Admitting: Internal Medicine

## 2021-12-02 VITALS — BP 138/86 | HR 59 | Temp 96.9°F | Resp 16 | Ht 68.5 in | Wt 160.4 lb

## 2021-12-02 DIAGNOSIS — E559 Vitamin D deficiency, unspecified: Secondary | ICD-10-CM

## 2021-12-02 DIAGNOSIS — Z0000 Encounter for general adult medical examination without abnormal findings: Secondary | ICD-10-CM

## 2021-12-02 DIAGNOSIS — Z0001 Encounter for general adult medical examination with abnormal findings: Secondary | ICD-10-CM

## 2021-12-02 DIAGNOSIS — N1831 Chronic kidney disease, stage 3a: Secondary | ICD-10-CM | POA: Diagnosis not present

## 2021-12-02 DIAGNOSIS — Z136 Encounter for screening for cardiovascular disorders: Secondary | ICD-10-CM

## 2021-12-02 DIAGNOSIS — E1169 Type 2 diabetes mellitus with other specified complication: Secondary | ICD-10-CM

## 2021-12-02 DIAGNOSIS — E1122 Type 2 diabetes mellitus with diabetic chronic kidney disease: Secondary | ICD-10-CM

## 2021-12-02 DIAGNOSIS — Z125 Encounter for screening for malignant neoplasm of prostate: Secondary | ICD-10-CM | POA: Diagnosis not present

## 2021-12-02 DIAGNOSIS — I10 Essential (primary) hypertension: Secondary | ICD-10-CM | POA: Diagnosis not present

## 2021-12-02 DIAGNOSIS — N401 Enlarged prostate with lower urinary tract symptoms: Secondary | ICD-10-CM

## 2021-12-02 DIAGNOSIS — M1000 Idiopathic gout, unspecified site: Secondary | ICD-10-CM | POA: Diagnosis not present

## 2021-12-02 DIAGNOSIS — E1140 Type 2 diabetes mellitus with diabetic neuropathy, unspecified: Secondary | ICD-10-CM

## 2021-12-02 DIAGNOSIS — Z8249 Family history of ischemic heart disease and other diseases of the circulatory system: Secondary | ICD-10-CM

## 2021-12-02 DIAGNOSIS — Z79899 Other long term (current) drug therapy: Secondary | ICD-10-CM | POA: Diagnosis not present

## 2021-12-02 DIAGNOSIS — G720 Drug-induced myopathy: Secondary | ICD-10-CM

## 2021-12-02 DIAGNOSIS — E785 Hyperlipidemia, unspecified: Secondary | ICD-10-CM | POA: Diagnosis not present

## 2021-12-02 DIAGNOSIS — Z1211 Encounter for screening for malignant neoplasm of colon: Secondary | ICD-10-CM

## 2021-12-02 DIAGNOSIS — N138 Other obstructive and reflux uropathy: Secondary | ICD-10-CM | POA: Diagnosis not present

## 2021-12-03 LAB — COMPLETE METABOLIC PANEL WITH GFR
AG Ratio: 2 (calc) (ref 1.0–2.5)
ALT: 36 U/L (ref 9–46)
AST: 34 U/L (ref 10–35)
Albumin: 5 g/dL (ref 3.6–5.1)
Alkaline phosphatase (APISO): 60 U/L (ref 35–144)
BUN/Creatinine Ratio: 19 (calc) (ref 6–22)
BUN: 33 mg/dL — ABNORMAL HIGH (ref 7–25)
CO2: 25 mmol/L (ref 20–32)
Calcium: 10.1 mg/dL (ref 8.6–10.3)
Chloride: 105 mmol/L (ref 98–110)
Creat: 1.78 mg/dL — ABNORMAL HIGH (ref 0.70–1.28)
Globulin: 2.5 g/dL (calc) (ref 1.9–3.7)
Glucose, Bld: 103 mg/dL — ABNORMAL HIGH (ref 65–99)
Potassium: 5.4 mmol/L — ABNORMAL HIGH (ref 3.5–5.3)
Sodium: 139 mmol/L (ref 135–146)
Total Bilirubin: 0.6 mg/dL (ref 0.2–1.2)
Total Protein: 7.5 g/dL (ref 6.1–8.1)
eGFR: 41 mL/min/{1.73_m2} — ABNORMAL LOW (ref >=60)

## 2021-12-03 LAB — CBC WITH DIFFERENTIAL/PLATELET
Absolute Monocytes: 490 cells/uL (ref 200–950)
Basophils Absolute: 72 cells/uL (ref 0–200)
Basophils Relative: 1 %
Eosinophils Absolute: 331 cells/uL (ref 15–500)
Eosinophils Relative: 4.6 %
HCT: 44.5 % (ref 38.5–50.0)
Hemoglobin: 15.2 g/dL (ref 13.2–17.1)
Lymphs Abs: 1541 cells/uL (ref 850–3900)
MCH: 28.3 pg (ref 27.0–33.0)
MCHC: 34.2 g/dL (ref 32.0–36.0)
MCV: 82.9 fL (ref 80.0–100.0)
MPV: 9.3 fL (ref 7.5–12.5)
Monocytes Relative: 6.8 %
Neutro Abs: 4766 cells/uL (ref 1500–7800)
Neutrophils Relative %: 66.2 %
Platelets: 287 10*3/uL (ref 140–400)
RBC: 5.37 10*6/uL (ref 4.20–5.80)
RDW: 13.1 % (ref 11.0–15.0)
Total Lymphocyte: 21.4 %
WBC: 7.2 10*3/uL (ref 3.8–10.8)

## 2021-12-03 LAB — URINALYSIS, ROUTINE W REFLEX MICROSCOPIC
Bilirubin Urine: NEGATIVE
Glucose, UA: NEGATIVE
Hgb urine dipstick: NEGATIVE
Ketones, ur: NEGATIVE
Leukocytes,Ua: NEGATIVE
Nitrite: NEGATIVE
Protein, ur: NEGATIVE
Specific Gravity, Urine: 1.012 (ref 1.001–1.035)
pH: 6.5 (ref 5.0–8.0)

## 2021-12-03 LAB — LIPID PANEL
Cholesterol: 213 mg/dL — ABNORMAL HIGH (ref <200)
HDL: 43 mg/dL (ref >=40)
LDL Cholesterol (Calc): 136 mg/dL (calc) — ABNORMAL HIGH
Non-HDL Cholesterol (Calc): 170 mg/dL (calc) — ABNORMAL HIGH (ref <130)
Total CHOL/HDL Ratio: 5 (calc) — ABNORMAL HIGH (ref <5.0)
Triglycerides: 202 mg/dL — ABNORMAL HIGH (ref <150)

## 2021-12-03 LAB — TSH: TSH: 1.42 mIU/L (ref 0.40–4.50)

## 2021-12-03 LAB — PSA: PSA: 2.26 ng/mL (ref <=4.00)

## 2021-12-03 LAB — HEMOGLOBIN A1C
Hgb A1c MFr Bld: 6.2 % of total Hgb — ABNORMAL HIGH (ref <5.7)
Mean Plasma Glucose: 131 mg/dL
eAG (mmol/L): 7.3 mmol/L

## 2021-12-03 LAB — VITAMIN D 25 HYDROXY (VIT D DEFICIENCY, FRACTURES): Vit D, 25-Hydroxy: 77 ng/mL (ref 30–100)

## 2021-12-03 LAB — MICROALBUMIN / CREATININE URINE RATIO
Creatinine, Urine: 59 mg/dL (ref 20–320)
Microalb Creat Ratio: 24 mcg/mg creat (ref <30)
Microalb, Ur: 1.4 mg/dL

## 2021-12-03 LAB — MAGNESIUM: Magnesium: 2.2 mg/dL (ref 1.5–2.5)

## 2021-12-03 LAB — INSULIN, RANDOM: Insulin: 11.4 u[IU]/mL

## 2021-12-03 LAB — URIC ACID: Uric Acid, Serum: 7.4 mg/dL (ref 4.0–8.0)

## 2021-12-04 NOTE — Progress Notes (Signed)
<><><><><><><><><><><><><><><><><><><><><><><><><><><><><><><><><> <><><><><><><><><><><><><><><><><><><><><><><><><><><><><><><><><> - Test results slightly outside the reference range are not unusual. If there is anything important, I will review this with you,  otherwise it is considered normal test values.  If you have further questions,  please do not hesitate to contact me at the office or via My Chart.  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  <><><><><><><><><><><><><><><><><><><><><><><><><><><><><><><><><> <><><><><><><><><><><><><><><><><><><><><><><><><><><><><><><><><>   -  Total  Chol =   213     - Elevated             (  Ideal  or  Goal is less than 180  !  )  & -  Bad / Dangerous LDL  Chol = 136   - also Elevated              (  Ideal  or  Goal is less than 70  !  )  <><><><><><><><><><><><><><><><><><><><><><><><><><><><><><><><><>  - Cholesterol is too high - Recommend a  STRICTER  low cholesterol diet   - Cholesterol only comes from animal sources  - ie. meat, dairy, egg yolks  - Eat all the vegetables you want.  - Avoid Meat,          Avoid Meat,                 Avoid Meat -                                                                              especially Red Meat - Beef AND Pork .  - Avoid cheese & dairy -  milk & ice cream.     - Cheese is the most concentrated form of trans-fats which  is the worst thing to clog up our arteries.   - Veggie cheese is OK which can be found in the fresh  produce section at [employer] or [employer] or [employer]  <><><><><><><><><><><><><><><><><><><><><><><><><><><><><><><><><> <><><><><><><><><><><><><><><><><><><><><><><><><><><><><><><><><>  - Also Triglycerides (   202   ) or fats in blood are too high                 (  Ideal or Goal is less than 150  !   )    - Recommend avoid fried & greasy foods,  sweets / candy,   - Avoid white rice  (  brown or wild rice or Quinoa is OK),   - Avoid white potatoes  (sweet potatoes are OK)   - Avoid anything made from white flour   - bagels, doughnuts, rolls, buns, biscuits, white and   wheat breads, pizza crust and traditional pasta made of white flour & egg white  - (vegetarian pasta or spinach or wheat pasta is OK).    - Multi-grain bread is OK - like multi-grain flat bread or  sandwich thins.   - Avoid alcohol in excess.   - Exercise is also important. <><><><><><><><><><><><><><><><><><><><><><><><><><><><><><><><><> <><><><><><><><><><><><><><><><><><><><><><><><><><><><><><><><><>  - A1c = 6.2% - Still too high   ( Ideal or Goal is less than 5.8%  )  <><><><><><><><><><><><><><><><><><><><><><><><><><><><><><><><><>  -  PSA  is low  - Great   !  <><><><><><><><><><><><><><><><><><><><><><><><><><><><><><><><><>  -  Uric Acid  / Gout test is OK - Please continue Allopurinol  <><><><><><><><><><><><><><><><><><><><><><><><><><><><><><><><><>  -  Vitamin DF= 77  -Excellent - Please  continue dose same  <><><><><><><><><><><><><><><><><><><><><><><><><><><><><><><><><>  -  All Else - CBC - Kidneys - Electrolytes - Liver - Magnesium & Thyroid    - all  Normal /  OK <><><><><><><><><><><><><><><><><><><><><><><><><><><><><><><><><> <><><><><><><><><><><><><><><><><><><><><><><><><><><><><><><><><>

## 2021-12-05 ENCOUNTER — Encounter: Payer: Self-pay | Admitting: Internal Medicine

## 2022-01-12 ENCOUNTER — Telehealth: Payer: Self-pay

## 2022-01-12 NOTE — Telephone Encounter (Signed)
LM-01/12/22-Chart Prep started, Reviewing OV, Consults, Hospital visits, Labs and medication changes.  Chart prep completed.  Total time spent: 37 min    LM-01/12/22-Care plan started. Care plan completed.  Total time spent: 10 min.

## 2022-01-12 NOTE — Telephone Encounter (Signed)
LM-01/12/22-Called pt. To confirm CP visit for 8/1 and ask if pt. Is okay with doing an office visit face to face since it is his initial visit for 8/1 at 9:00AM. Unable to reach pt. VM box full, unable to LVM.  Total time spent: 2 min.

## 2022-01-17 NOTE — Telephone Encounter (Signed)
LM-01/17/22-Called pt. To confirm CP initial visit for 8/2. Pt. Stated he needed to r/s but was at work and requested to call me back to r/s.   Total time spent: 4 min.

## 2022-01-18 ENCOUNTER — Telehealth: Payer: PPO | Admitting: Pharmacy Technician

## 2022-01-20 ENCOUNTER — Telehealth: Payer: Self-pay

## 2022-01-20 NOTE — Telephone Encounter (Signed)
LM-02/09/22-Called pt. And confirmed CP phone call visit for 8/29 at 4:00PM. Precall questions completed. Pt. expressed concern regarding his Metformin causing weight loss. Pt. is prescribed Metformin '500mg'$  BID since 2020, but pt. informed me he only takes 1 tablet a day due to weight loss. Since 2021, per pt. he was 198 pounds and is now 159 pounds. Pt. is concerned that he has lost too much weight and is trying to gain weight. Pt. denied nausea/vomiting/diarrhea from Metformin. Informed pt. weight loss is common w/ Metformin. Pt. also wanted to discuss his BP. Pt. stated his BP gets low at times.  Total time spent: 10 min.    LM-01/20/22-Called pt. To r/s CP visit. Patient preferred visit to be done on a Wednesday. Pt. Works and is not available on Tuesday's until 4pm or after. Spoke w/ pt. And scheduled him for 8/19 at 4pm, and after getting off the phone w/ pt, I realized he is not a f/u visit, but an initial. Message sent to CP to inquire where I can put him on the schedule. Will call patient back after speaking w/ CP.  Total time spent: 6 min.

## 2022-02-09 NOTE — Telephone Encounter (Signed)
LM-02/09/22-Chart Prep started, Reviewing OV, Consults, Hospital visits, Labs and medication changes.  Chart reviewed and updated in chart prep. Medicare cost analysis completed.  Total time spent: 22 min.

## 2022-02-15 ENCOUNTER — Ambulatory Visit: Payer: PPO | Admitting: Pharmacy Technician

## 2022-02-15 DIAGNOSIS — N1832 Chronic kidney disease, stage 3b: Secondary | ICD-10-CM

## 2022-02-15 DIAGNOSIS — I10 Essential (primary) hypertension: Secondary | ICD-10-CM

## 2022-02-15 DIAGNOSIS — E1169 Type 2 diabetes mellitus with other specified complication: Secondary | ICD-10-CM

## 2022-02-17 ENCOUNTER — Telehealth: Payer: Self-pay | Admitting: Gastroenterology

## 2022-02-17 ENCOUNTER — Encounter: Payer: Self-pay | Admitting: Gastroenterology

## 2022-02-17 DIAGNOSIS — N1832 Chronic kidney disease, stage 3b: Secondary | ICD-10-CM | POA: Diagnosis not present

## 2022-02-17 DIAGNOSIS — E1122 Type 2 diabetes mellitus with diabetic chronic kidney disease: Secondary | ICD-10-CM | POA: Diagnosis not present

## 2022-02-17 DIAGNOSIS — E1169 Type 2 diabetes mellitus with other specified complication: Secondary | ICD-10-CM | POA: Diagnosis not present

## 2022-02-17 DIAGNOSIS — E785 Hyperlipidemia, unspecified: Secondary | ICD-10-CM | POA: Diagnosis not present

## 2022-02-17 NOTE — Telephone Encounter (Signed)
This patient is scheduled for a PV on 03/16/22-he will get prep instructions at that time.

## 2022-02-17 NOTE — Telephone Encounter (Signed)
PT rescheduled both PV and colonoscopy. Can you please resend prep instructions with the proper dates and times. Please advise. Thank you

## 2022-02-23 ENCOUNTER — Telehealth: Payer: Self-pay | Admitting: Internal Medicine

## 2022-02-23 ENCOUNTER — Other Ambulatory Visit: Payer: Self-pay

## 2022-02-23 ENCOUNTER — Telehealth: Payer: Self-pay

## 2022-02-23 MED ORDER — LIVALO 2 MG PO TABS: ORAL_TABLET | ORAL | 1 refills | Status: DC

## 2022-02-23 NOTE — Telephone Encounter (Signed)
Requesting a refill on Pitavastatin Calcium to go to [facility] on file

## 2022-02-23 NOTE — Telephone Encounter (Signed)
Refill request ordered

## 2022-02-23 NOTE — Telephone Encounter (Signed)
LM-02/23/22-Called [facility] to request a refill for patients Pitavastatin. Spoke to [place] with [facility] and was informed the prescriber would need to call in a new RX for it because the pt. Has not had the medication filled in over a year. Message sent to [facility] staff member Estill Bamberg to request refill to be sent in.  Total time spent: 10 min.

## 2022-02-23 NOTE — Progress Notes (Signed)
Initial Pharmacist Visit (CCM)  [name],[name]  68 years, Male  DOB: 1950/11/21  M:   __________________________________________________  Visit Details Patient scheduled for CCM visit with the clinical pharmacist.  Patient is referred for CCM by their PCP and Clinical Pharmacist is under general PCP supervision.: At least 2 of these conditions are expected to last 12 months or longer and patient is at significant risk for acute exacerbations and/or functional decline.  Patient has consented to participation in [place] program. Visit Type: Clinic visit Date of Upcoming Visit: 02/15/2022  Chronic Conditions Patient's Chronic Conditions: Chronic Kidney Disease (CKD), Diabetes (DM), Gout, Hyperlipidemia/Dyslipidemia (HLD), Hypertension (HTN)  Summary for PCP:  1. Patient self-decreased Metformin to 573m BID. Patient has had about a 35 pound weight loss over a year. 2. Patient needs eye exam. 3. Has not refilled Pitavastatin but had no issues. Will call pharmacy to refill. 4. May need to consider Repatha to get LDL down to goal. 5. Increase fluids to at least 64oz/day.  Doctor and Hospital Visits Were there PCP Visits in last 6 months?: Yes PCP Visits details: 12/02/21-Dr. MMelford Aase Pt. presented for AWV no medication changes noted. Were there Specialist Visits in last 6 months?: No Was there a Hospital Visit in last 30 days?: No Were there other Hospital Visits in last 6 months?: No  Engagement Notes HC Chart/ CP prep: 22 min.  Subjective Information Visit Completed on: 02/15/2022 Did patient bring medications to appointment?: No What source (s) was used to reconcile medications this visit?: EMR list, Verbal recall from patient/caregiver Subjective: Patient presenting via phone for CCM initial visit. Patient is still working so could not make it into the office for visit. He states he has not been as compliant with his medications as usual because people have told him "bad things" about  Metformin. He also has not been taking the cholesterol medicine DHinton Dyerprescribed because he threw away the bottle and forgot the name of it so never had it refilled. Recently he has been struggling with the repercussions of having to put his mother in a nursing home and feels guilty because his mom blames him every day and resents him for it. He otherwise is doing okay. Lifestyle habits: Diet: Eats what he wants Exercise: Walks a mile or so for work, states he is active Tobacco: None Alcohol: None Caffeine: 1 SF soda per day, 2-3 cups decaf coffee Recreational drugs: None What is the patient's sleep pattern?: No sleep issues How many hours per night does patient typically sleep?: 8+  SDOH: Accountable Health Communities Health-Related Social Needs Screening Tool ([email or web address] SDOH questions were documented and reviewed (EMR or [employer]) within the past 12 months or since hospitalization?: No What is your living situation today? (ref #1): I have a steady place to live Think about the place you live. Do you have problems with any of the following? (ref #2): None of the above Within the past 12 months, you worried that your food would run out before you got money to buy more (ref #3): Never true Within the past 12 months, the food you bought just didn't last and you didn't have money to get more (ref #4): Never true In the past 12 months, has lack of reliable transportation kept you from medical appointments, meetings, work or from getting things needed for daily living? (ref #5): No In the past 12 months, has the electric, gas, oil, or water company threatened to shut off services in your home? (ref #6): No How often does  anyone, including family and friends, physically hurt you? (ref #7): Never (1) How often does anyone, including family and friends, insult or talk down to you? (ref #8): Never (1) How often does anyone, including friends  and family, threaten you with harm? (ref #9): Never (1) How often does anyone, including family and friends, scream or curse at you? (ref #10): Never (1)  Medication Adherence Does the [facility] have access to medication refill history?: Yes Medication adherence rates for STAR metric medications:  Enalapril 67m-  08/29/21 (90DS), 11/29/21 (90DS) Next FD should be 02/27/22 Ezetimibe 145m 10/09/21 (90DS), 10/27/21 (90DS) Next FD should be 01/25/22 Metformin 50038m4/21/23 (90DS), 01/03/22 (90DS) Next FD should be 04/03/22  Medication adherence rates for non-STAR metric medications: Allopurinol 300m46m0/20/20 (90DS), 05/29/21 (90DS) Refill date should have been on 08/27/21  Factors that may affect medication adherence?: Pill burden, Prior treatment failures, Perceived lack of benefit of therapy [facility] Are meds synced by current pharmacy?: No Are meds delivered by current pharmacy?: No - delivery not available Would patient benefit from direct intervention of clinical lead in dispensing process to optimize clinical outcomes?: No Are [facility] pharmacy services available where patient lives?: Yes Is patient disadvantaged to use [facility]?: No [facility] services reviewed with patient and patient wishes to change pharmacy?: No Select reason patient declined to change pharmacies: Ability to purchase non-pharmacy items at the same time Does patient experience delays in picking up medications due to transportation concerns (getting to pharmacy)?: No Medication organization: Uses the bottles Additional Info: Not compliant with Metformin and Pitavastatin We discussed: Use of adherence aids (pillbox, alarms), Contacting pharmacy a week before running out of medications to request refill Assessment:: Non-adherent Plan/Follow up: Patient to discuss metformin use and weight loss [facility]astatin  Hypertension (HTN) Most Recent BP: 138/86 Most Recent HR: 80 taken on: 12/02/2021 Care Gap: Need BP documented or last BP 140/90 or higher: Addressed Assessed today?: Yes BP today is: 120/68 Goal: <130/80 mmHG Is Patient checking BP at home?: Yes Patient home BP readings are ranging: 110s-120s/70s Has patient experienced hypotension, dizziness, falls or bradycardia?: No We discussed: DASH diet:  following a diet emphasizing fruits and vegetables and low-fat dairy products along with whole grains, fish, poultry, and nuts. Reducing red meats and sugars., Reducing the amount of salt intake to <1500mg21m day., Getting enough potassium in your diet equaling 3500-5000mg/35m  This helps to regulate BP by balancing out the effects of salt., Proper Home BP Measurement, Hypertension pathophysiology, complications, treatment goals, and management with patient for 10-15 minutes, Increasing movement, Increasing exercise (walking, biking, swimming) to a goal of 30 minutes per day, as able based on current activity level and health or as directed by your healthcare provider., Contacting PCP office for signs and symptoms of high or low blood pressure (hypotension, dizziness, falls, headaches, edema) Assessment:: Controlled Drug: Enalapril 20mg d57m Pharmacist Assessment: Appropriate, Effective, Safe, Accessible Plan/Follow up: Counseled on hydration at minimum 64oz daily Monitor salt intake and continuing being active.  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 Increasing soluble fiber intake.  Avoiding sugary foods and trans fat, limiting carbohydrates, and reducing portion sizes. Recommended increasing intake of healthy fats into their diet, Weight reduction- We discussed losing 5-10% of body weight Assessment:: Uncontrolled Drug: Ezetimibe 67m- 1 tab QD Pharmacist Assessment: Appropriate, Query Effectiveness Drug: Pitavastatin 21m 1 tab QD Pharmacist Assessment: Appropriate, Query Effectiveness Plan/Follow up: Restart Pitavastatin- Will [facility] Counseled on diet and lifestyle modifications PCP consider Repatha or Nexlizet  Diabetes (DM) Most recent A1C: 6.2 taken on: 12/02/2021 Previous A1C: 6.2 taken on: 06/10/2021 Most Recent GFR: 41 taken on: 12/02/2021 Type: 2 Most recent microalbumin ratio: 24 tested on: 12/02/2021 Care Gap: Statin therapy needed: Addressed Care Gap: Need A1c documented or last A1c > 9 %: Addressed Care Gap: Need eye exam documented in EMR or by claim: Needs to be addressed Care Gap: Need eGFR and uACR for kidney health evaluation: Addressed Assessed today?: Yes Goal A1C: < 6.5 % Type: 2 Is Patient taking statin medication?: Yes Is patient taking ACEi / ARB?: Yes Blood glucose monitoring: Not monitoring Has patient experienced any hypoglycemic episodes?: None Diet Recall: "Whatever his wife cooks" Additional Info: Patient has only been taking Metformin 50018mID due to ~ 35 pound weight loss in 1 year. We discussed: How to recognize and treat signs of hypoglycemia., Low carbohydrate eating plan with an emphasis on whole grains,  legumes, nuts, fruits, and vegetables and minimal refined and processed foods., Proper diabetic foot care including daily foot exams and wearing closed toe shoes., Healthy eating habits for DM with patient for 15-32-99nutes, Complications of Type 2 DM and preventative measures with patient for 5-10 minutes, Avoiding both sugar-sweetened and artificially (aspartame) sweetened beverages and increase water intake., Increasing exercise (walking, biking, swimming) to a goal of 30 minutes per day, as able based on current activity level and health, Fasting and post-prandial blood glucose goals, Scheduling a yearly eye exam, Scheduling a dental appointment twice yearly Assessment:: Controlled Drug: Metformin XR 500m50m tab BID w/ meals Pharmacist Assessment: Appropriate, Effective, Query Safety Plan/Follow up: Patient to discuss Metformin at AWV with PCP  Gout Most recent uric acid: 7.4 taken on: 12/02/2021 Assessed today?: No Drug: Allopurinol 300mg20mtab QD Pharmacist Assessment: Appropriate, Effective, Safe, Accessible  Chronic kidney disease (CKD) Most Recent GFR: 41 taken on: 12/02/2021 Previous GFR: 47 taken on: 06/10/2021 Most recent microalbumin ratio: 24 tested on: 12/02/2021 Assessed today?: No Plan/Follow up: Counseled to increase water intake to at least 64oz/day  Preventative Health Care Gap: Colorectal cancer screening: Addressed Care Gap: Annual Wellness Visit (AWV): Addressed Additional exercise counseling points. We discussed: aiming to lose 5 to 10% of body weight through lifestyle modifications, targeting at least 150 minutes per week of moderate-intensity aerobic exercise., incorporating flexibility, balance, and strength training exercises, encouraging participation in insurance-covered exercise program through local gym (i.e. [employer]), decreasing sedentary behavior Additional diet counseling points. We discussed: key components of the DASH diet, key components of a  low-carb eating plan, aiming to consume at least 8 cups of water day Plan/Follow up: Schedule Yearly eye exam  CPP Televisit: 37min74m  Doc: 42mn CPP Care Plan: 660m  Clinical Summary Next CCM Follow Up: 6 months Next AWV: 03/16/22 at 2:30PM Next PCP Visit: 06/10/22 at 10:30AM   Attestation Statement:: CCM Services:  This encounter meets complex CCM services and moderate to high medical decision making.  Prior to outreach and patient consent for Chronic Care Management, I referred this patient for services after reviewing the nominated patient list or from a personal encounter with the patient.  I have personally reviewed this encounter including the documentation in this note and have collaborated with the care management provider regarding care management and care coordination activities to include development and update of the comprehensive care plan I am certifying that I agree with the content of this note and encounter as supervising physician. Pharmacy Interventions Intervention Details Pharmacist Interventions discussed: Yes Modified Therapy: Dose related undesirable effect Education: Lifestyle modifications, Medication administration  AvMarda StalkerPharmD Clinical Pharmacist AvNaida [email or web address] [phone]

## 2022-02-23 NOTE — Telephone Encounter (Signed)
LM-02/23/22-CP visit notes uploaded to pts. Documents. [facility] and requested a refill on pts. Pitavastatin.  Total time spent: 11 min.

## 2022-02-24 ENCOUNTER — Telehealth: Payer: Self-pay

## 2022-02-24 NOTE — Telephone Encounter (Addendum)
[employer] prior auth completed and submitted.     Prior auth approved through 06/19/22

## 2022-03-16 ENCOUNTER — Ambulatory Visit: Payer: PPO | Admitting: Nurse Practitioner

## 2022-03-16 ENCOUNTER — Ambulatory Visit (AMBULATORY_SURGERY_CENTER): Payer: Self-pay

## 2022-03-16 VITALS — Ht 68.0 in | Wt 159.0 lb

## 2022-03-16 DIAGNOSIS — Z86010 Personal history of colonic polyps: Secondary | ICD-10-CM

## 2022-03-16 MED ORDER — NA SULFATE-K SULFATE-MG SULF 17.5-3.13-1.6 GM/177ML PO SOLN: 1.0000 | Freq: Once | ORAL | 0 refills | Status: AC

## 2022-03-16 NOTE — Progress Notes (Signed)
No egg or soy allergy known to patient  No issues known to pt with past sedation with any surgeries or procedures Patient denies ever being told they had issues or difficulty with intubation  No FH of Malignant Hyperthermia Pt is not on diet pills Pt is not on  home 02  Pt is not on blood thinners  Pt denies issues with constipation  No A fib or A flutter Have any cardiac testing pending--no Pt instructed to use [email or web address] or GoodRx for a price reduction on prep   

## 2022-03-19 DIAGNOSIS — E1122 Type 2 diabetes mellitus with diabetic chronic kidney disease: Secondary | ICD-10-CM

## 2022-03-19 DIAGNOSIS — E785 Hyperlipidemia, unspecified: Secondary | ICD-10-CM

## 2022-03-19 DIAGNOSIS — E1169 Type 2 diabetes mellitus with other specified complication: Secondary | ICD-10-CM

## 2022-03-19 DIAGNOSIS — N1832 Chronic kidney disease, stage 3b: Secondary | ICD-10-CM

## 2022-03-23 ENCOUNTER — Encounter: Payer: PPO | Admitting: Gastroenterology

## 2022-03-23 NOTE — Progress Notes (Unsigned)
MEDICARE ANNUAL WELLNESS VISIT AND FOLLOW UP Assessment:    Encounter for Medicare annual wellness exam Refuses covid vaccine, shingles vaccine, influenza vaccine  Due Annually  Health Maintenance Reviewed  Healthy lifestyle reviewed and goals set  Encouraged to set up Eye exam  Essential hypertension Enalapril 1m daily- checks blood pressure and takes half if running less than 100/60 Monitor blood pressure at home; call if consistently over 130/80 Continue DASH diet.   Reminder to go to the ER if any CP, SOB, nausea, dizziness, severe HA, changes vision/speech, left arm numbness and tingling and jaw pain. - CBC  Type 2 diabetes mellitus with stage 3 chronic kidney disease, without long-term current use of insulin (HCC) Type 2 Diabetes Mellitus Education: Reviewed 'ABCs' of diabetes management (respective goals in parentheses):  A1C (<7), blood pressure (<130/80), and cholesterol (LDL <70) Eye Exam yearly and Dental Exam every 6 months Dietary recommendations PATIENT ONLY ON 2 METFORMIN A DAY WITH WEIGHT LOSS Physical Activity recommendations - CMP -     Hemoglobin A1c  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 (HCC) Increase fluids, avoid NSAIDS, monitor sugars, will monitor -     CMP WITH GFR  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  -     Lipid panel   Testosterone deficiency Has opted against supplementation Zinc 50 mg daily supplement, high-intensity interval exercises  Idiopathic gout, unspecified chronicity, unspecified site Continue allopurinol Diet discussed Had recent flare at beginning of December, has resolved  Vitamin D deficiency At goal at check on 12/02/21 Defer vitamin D check until next visit  Medication management -     CBC with Differential/Platelet -      CMP  Diabetic polyneuropathy associated with type 2 diabetes mellitus (HCC) Continue gabapentin Continue weight loss  Statin MYOPATHY Refuses cholesterol medication  Acute right knee pain Monitor and if pain worsens will refer to orthopedics Declines imaging today  Unspecified skin changes Several seborrheic keratoses, would like removed Referred to skin surgery center.   Acute right sided low back pain without sciatica Does not wish further evaluation at this time Monitor and if worsens will refer to ortho  Over 30 minutes of exam, counseling, chart review, and critical decision making was performed  Future Appointments  Date Time Provider [place] 04/15/2022  9:30 AM [name], [name] [facility]  06/10/2022 10:30 AM [name] [facility] None  12/07/2022 11:00 AM [name] [facility] None  03/09/2023  4:00 PM WAlycia Rossetti Stephenson [facility] None     Plan:   During the course of the visit the patient was educated and counseled about appropriate screening and preventive services including:   Pneumococcal vaccine  Influenza vaccine Prevnar 13 Td vaccine Screening electrocardiogram Colorectal cancer screening Diabetes screening Glaucoma screening Nutrition counseling    Subjective:  Wayne Calabrettais a 71y.o. male who presents for Medicare Annual Wellness Visit and 3 month follow up for HTN, hyperlipidemia, T2DM with CKD 3, and vitamin D Def.   He has an aching sensation in his right lower back that is worse when lying flat x past 2 weeks.  Ibuprofen does help relieve the pain. Denies pain that radiates down his leg.   He was outside working out in the yard in hot weather and was bending over making  a walkway.  He felt overly fatigued and bp was 90/49.   His mom has gone to an skilled nursing facility in [place] and this is very stressful for him.  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  He continues to do a lot of fishing.   BMI is Body mass index is 23.46 kg/m., he has been working on diet- avoiding sweets and soda. Increased water, continues to exercise.  Wt Readings from Last 3 Encounters:  03/24/22 156 lb 9.6 oz (71 kg)  03/16/22 159 lb (72.1 kg)  12/02/21 160 lb 6.4 oz (72.8 kg)   His blood pressure has been controlled at home usually 110-130/60-70, today their BP is BP: 130/70. He is checking his blood pressure and running normal , taking enalapril 20 mg daily, will cut in half if it gets too low  BP Readings from Last 3 Encounters:  03/24/22 130/70  12/02/21 138/86  06/10/21 124/68    He does not workout - but walks extensively at home and fishes on his days off.  He denies chest pain, shortness of breath, dizziness.   He is not on cholesterol medication (intolerant of several statins)  His cholesterol is not at goal. The cholesterol last visit was:   Lab Results  Component Value Date   CHOL 213 (H) 12/02/2021   HDL 43 12/02/2021   LDLCALC 136 (H) 12/02/2021   TRIG 202 (H) 12/02/2021   CHOLHDL 5.0 (H) 12/02/2021   He has been working on diet and exercise for T2 diabetes With hyperlipdiemia- has statin myopathy With CKD With neuropathy on gabapentin HE DECREASED TO 1 IN THE AM AND 1 NIGHT and denies increased appetite, nausea, paresthesia of the feet, polydipsia, polyuria, visual disturbances, vomiting and weight loss Fasting blood sugars are running 90-120 Last A1C in the office was:  Lab Results  Component Value Date   HGBA1C 6.2 (H) 12/02/2021   Last GFR Lab Results  Component Value Date   EGFR 41 (L) 12/02/2021     Patient is on Vitamin D supplement but remained below goal at most recent check:    Lab Results  Component Value Date   VD25OH 77 12/02/2021     Patient is on allopurinol for gout and does not report a recent flare.  Lab Results  Component Value Date   LABURIC 7.4 12/02/2021     Medication  Review:  Current Outpatient Medications (Endocrine & Metabolic):    metFORMIN (GLUCOPHAGE-XR) 500 MG 24 hr tablet, Take 500 mg by mouth daily with breakfast.  Current Outpatient Medications (Cardiovascular):    enalapril (VASOTEC) 20 MG tablet, Take 20 mg by mouth daily.   Current Outpatient Medications (Analgesics):    aspirin EC 81 MG tablet, Take 81 mg by mouth daily. Swallow whole.  Current Outpatient Medications (Hematological):    Cyanocobalamin (VITAMIN B-12 PO), Take 1,000 mg by mouth daily.  Current Outpatient Medications (Other):    Cholecalciferol (VITAMIN D3 PO), Take 5,000 Units by mouth daily.   gabapentin (NEURONTIN) 800 MG tablet, Take 800 mg by mouth 2 (two) times daily.  Allergies: Allergies  Allergen Reactions   Crestor [Rosuvastatin] Diarrhea   Welchol [Colesevelam Hcl] Diarrhea   Zoloft [Sertraline Hcl] Other (See Comments)    unknown   Penicillins Other (See Comments)    Current Problems (verified) has Hyperlipidemia associated with type 2 diabetes mellitus ([place]); Hypertension; Type 2 diabetes mellitus with stage 3 chronic kidney disease ([place]); Testosterone  deficiency; Gout; Vitamin D deficiency; Medication management; 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 ([place]); Diabetes, polyneuropathy ([place]); Statin intolerance; Closed fracture of middle of right radius and ulna; Statin myopathy; and Senile purpura ([place]) on their problem list.  Screening Tests Immunization History  Administered Date(s) Administered   PPD Test 02/07/2014, 02/16/2015   Pneumococcal Conjugate-13 04/12/2017   Pneumococcal Polysaccharide-23 11/01/2010, 05/29/2018   Td 12/25/2012   Zoster, Live 12/25/2012   Health Maintenance  Topic Date Due   INFLUENZA VACCINE  Never done   OPHTHALMOLOGY EXAM  03/24/2022 (Originally 07/21/2018)   COVID-19 Vaccine (1) 04/09/2022 (Originally 03/01/1956)   COLONOSCOPY (Pts 45-14yr Insurance coverage will need to be confirmed)  06/10/2022 (Originally  11/07/2020)   Zoster Vaccines- Shingrix (1 of 2) 06/24/2022 (Originally 03/01/1970)   HEMOGLOBIN A1C  06/03/2022   FOOT EXAM  12/02/2022   Diabetic kidney evaluation - GFR measurement  12/03/2022   Diabetic kidney evaluation - Urine ACR  12/03/2022   TETANUS/TDAP  12/26/2022   Pneumonia Vaccine 71 Years old  Completed   Hepatitis C Screening  Completed   HPV VACCINES  Aged Out    Preventative care: Last colonoscopy: 10/2017, scheduled 04/15/22 cologuard + sent for colonoscopy Due Now will schedule  Names of Other Physician/Practitioners you currently use: 1. [facility] here for primary care 2. Dr. HHerbert Deaner eye doctor, last visit 2019- Needs to schedule 3. Dr. BGloriann Loan dentist, last visit 2022  Patient Care Team: [name] as PCP - General (Internal Medicine) [facility] (Pharmacist)  Surgical: He  has a past surgical history that includes Wrist surgery (Right, 1998); Prostate biopsy (2010); Cyst removal hand; and ORIF radial fracture (Right, 07/18/2019). Family His family history includes Heart disease in his father; Heart disease (age of onset: 823 in his mother; Heart failure in his father; Kidney disease in his father. Social history  He reports that he has never smoked. He has never used smokeless tobacco. He reports that he does not currently use alcohol. He reports that he does not use drugs.  MEDICARE WELLNESS OBJECTIVES: Physical activity: Current Exercise Habits: Home exercise routine, Type of exercise: walking, Time (Minutes): 20, Frequency (Times/Week): 2, Weekly Exercise (Minutes/Week): 40, Intensity: Mild, Exercise limited by: None identified Cardiac risk factors: Cardiac Risk Factors include: advanced age (>560m, >6>49omen);dyslipidemia;diabetes mellitus;hypertension;male gender Depression/mood screen:      03/24/2022    4:41 PM  Depression screen PHQ 2/9  Decreased Interest 1  Down, Depressed,  Hopeless 1  PHQ - 2 Score 2  Altered sleeping 0  Tired, decreased energy 1  Change in appetite 0  Feeling bad or failure about yourself  0  Trouble concentrating 0  Moving slowly or fidgety/restless 0  Suicidal thoughts 0  PHQ-9 Score 3  Difficult doing work/chores Somewhat difficult    ADLs:     03/24/2022    4:42 PM 12/01/2021   10:11 PM  In your present state of health, do you have any difficulty performing the following activities:  Hearing? 0 0  Vision? 0 0  Difficulty concentrating or making decisions? 0 0  Walking or climbing stairs? 0 0  Dressing or bathing? 0 0  Doing errands, shopping? 0 0     Cognitive Testing  Alert? Yes  Normal Appearance?Yes  Oriented to person? Yes  Place? Yes   Time? Yes  Recall of three objects?  Yes  Can perform simple calculations? Yes  Displays appropriate judgment?Yes  Can  read the correct time from a watch face?Yes  EOL planning: Does Patient Have a Medical Advance Directive?: No Would patient like information on creating a medical advance directive?: No - Patient declined  Review of Systems  Constitutional:  Negative for malaise/fatigue and weight loss.  HENT:  Negative for hearing loss and tinnitus.   Eyes:  Negative for blurred vision and double vision.  Respiratory:  Negative for cough, shortness of breath and wheezing.   Cardiovascular:  Negative for chest pain, palpitations, orthopnea, claudication and leg swelling.  Gastrointestinal:  Negative for abdominal pain, blood in stool, constipation, diarrhea, heartburn, melena, nausea and vomiting.  Genitourinary: Negative.   Musculoskeletal:  Positive for back pain (RLQ) and joint pain (Right knee). Negative for myalgias.  Skin:  Negative for rash.       Scaly lesions of head and hands  Neurological:  Negative for dizziness, tingling, sensory change, weakness and headaches.  Endo/Heme/Allergies:  Negative for polydipsia.  Psychiatric/Behavioral: Negative.    All other systems  reviewed and are negative.    Objective:   Today's Vitals   03/24/22 1607  BP: 130/70  Pulse: 64  Temp: 97.9 F (36.6 C)  SpO2: 99%  Weight: 156 lb 9.6 oz (71 kg)  Height: 5' 8.5" (1.74 m)   Body mass index is 23.46 kg/m.  General appearance: alert, no distress, WD/WN, male HEENT: normocephalic, sclerae anicteric, TMs pearly, nares patent, no discharge or erythema, pharynx normal Oral cavity: MMM, no lesions Neck: supple, no lymphadenopathy, no thyromegaly, no masses Heart: RRR, normal S1, S2, no murmurs Lungs: CTA bilaterally, no wheezes, rhonchi, or rales Abdomen: +bs, soft, non tender, non distended, no masses, no hepatomegaly, no splenomegaly Skin: warm, dry. Seborrheic keratosis of right temple Musculoskeletal: nontender, no swelling, no obvious deformity Extremities: no edema, no cyanosis, no clubbing Pulses: 2+ symmetric, upper and lower extremities, normal cap refill Neurological: alert, oriented x 3, CN2-12 intact, strength normal upper extremities and lower extremities, sensation normal throughout, DTRs 2+ throughout, no cerebellar signs, gait normal Psychiatric: normal affect, behavior normal, pleasant   Medicare Attestation I have personally reviewed: The patient's medical and social history Their use of alcohol, tobacco or illicit drugs Their current medications and supplements The patient's functional ability including ADLs,fall risks, home safety risks, cognitive, and hearing and visual impairment Diet and physical activities Evidence for depression or mood disorders  The patient's weight, height, BMI, and visual acuity have been recorded in the chart.  I have made referrals, counseling, and provided education to the patient based on review of the above and I have provided the patient with a written personalized care plan for preventive services.     Wayne Rossetti, Stephenson   03/24/2022

## 2022-03-24 ENCOUNTER — Encounter: Payer: Self-pay | Admitting: Nurse Practitioner

## 2022-03-24 ENCOUNTER — Ambulatory Visit (INDEPENDENT_AMBULATORY_CARE_PROVIDER_SITE_OTHER): Payer: PPO | Admitting: Nurse Practitioner

## 2022-03-24 VITALS — BP 130/70 | HR 64 | Temp 97.9°F | Ht 68.5 in | Wt 156.6 lb

## 2022-03-24 DIAGNOSIS — R6889 Other general symptoms and signs: Secondary | ICD-10-CM

## 2022-03-24 DIAGNOSIS — M545 Low back pain, unspecified: Secondary | ICD-10-CM | POA: Diagnosis not present

## 2022-03-24 DIAGNOSIS — E1140 Type 2 diabetes mellitus with diabetic neuropathy, unspecified: Secondary | ICD-10-CM

## 2022-03-24 DIAGNOSIS — E559 Vitamin D deficiency, unspecified: Secondary | ICD-10-CM | POA: Diagnosis not present

## 2022-03-24 DIAGNOSIS — D692 Other nonthrombocytopenic purpura: Secondary | ICD-10-CM | POA: Diagnosis not present

## 2022-03-24 DIAGNOSIS — Z79899 Other long term (current) drug therapy: Secondary | ICD-10-CM

## 2022-03-24 DIAGNOSIS — E785 Hyperlipidemia, unspecified: Secondary | ICD-10-CM | POA: Diagnosis not present

## 2022-03-24 DIAGNOSIS — E349 Endocrine disorder, unspecified: Secondary | ICD-10-CM | POA: Diagnosis not present

## 2022-03-24 DIAGNOSIS — M1000 Idiopathic gout, unspecified site: Secondary | ICD-10-CM | POA: Diagnosis not present

## 2022-03-24 DIAGNOSIS — Z0000 Encounter for general adult medical examination without abnormal findings: Secondary | ICD-10-CM

## 2022-03-24 DIAGNOSIS — N183 Chronic kidney disease, stage 3 unspecified: Secondary | ICD-10-CM | POA: Diagnosis not present

## 2022-03-24 DIAGNOSIS — R239 Unspecified skin changes: Secondary | ICD-10-CM | POA: Diagnosis not present

## 2022-03-24 DIAGNOSIS — E1169 Type 2 diabetes mellitus with other specified complication: Secondary | ICD-10-CM

## 2022-03-24 DIAGNOSIS — I10 Essential (primary) hypertension: Secondary | ICD-10-CM | POA: Diagnosis not present

## 2022-03-24 DIAGNOSIS — N1832 Chronic kidney disease, stage 3b: Secondary | ICD-10-CM | POA: Diagnosis not present

## 2022-03-24 DIAGNOSIS — Z0001 Encounter for general adult medical examination with abnormal findings: Secondary | ICD-10-CM

## 2022-03-24 DIAGNOSIS — E1122 Type 2 diabetes mellitus with diabetic chronic kidney disease: Secondary | ICD-10-CM | POA: Diagnosis not present

## 2022-03-24 DIAGNOSIS — G720 Drug-induced myopathy: Secondary | ICD-10-CM | POA: Diagnosis not present

## 2022-03-25 LAB — CBC WITH DIFFERENTIAL/PLATELET
Absolute Monocytes: 511 cells/uL (ref 200–950)
Basophils Absolute: 70 cells/uL (ref 0–200)
Basophils Relative: 1 %
Eosinophils Absolute: 329 cells/uL (ref 15–500)
Eosinophils Relative: 4.7 %
HCT: 44.9 % (ref 38.5–50.0)
Hemoglobin: 15.2 g/dL (ref 13.2–17.1)
Lymphs Abs: 1715 cells/uL (ref 850–3900)
MCH: 28.8 pg (ref 27.0–33.0)
MCHC: 33.9 g/dL (ref 32.0–36.0)
MCV: 85 fL (ref 80.0–100.0)
MPV: 9.9 fL (ref 7.5–12.5)
Monocytes Relative: 7.3 %
Neutro Abs: 4375 cells/uL (ref 1500–7800)
Neutrophils Relative %: 62.5 %
Platelets: 257 10*3/uL (ref 140–400)
RBC: 5.28 10*6/uL (ref 4.20–5.80)
RDW: 12.4 % (ref 11.0–15.0)
Total Lymphocyte: 24.5 %
WBC: 7 10*3/uL (ref 3.8–10.8)

## 2022-03-25 LAB — COMPLETE METABOLIC PANEL WITH GFR
AG Ratio: 1.8 (calc) (ref 1.0–2.5)
ALT: 45 U/L (ref 9–46)
AST: 36 U/L — ABNORMAL HIGH (ref 10–35)
Albumin: 4.9 g/dL (ref 3.6–5.1)
Alkaline phosphatase (APISO): 59 U/L (ref 35–144)
BUN/Creatinine Ratio: 15 (calc) (ref 6–22)
BUN: 25 mg/dL (ref 7–25)
CO2: 24 mmol/L (ref 20–32)
Calcium: 9.9 mg/dL (ref 8.6–10.3)
Chloride: 105 mmol/L (ref 98–110)
Creat: 1.63 mg/dL — ABNORMAL HIGH (ref 0.70–1.28)
Globulin: 2.7 g/dL (calc) (ref 1.9–3.7)
Glucose, Bld: 90 mg/dL (ref 65–99)
Potassium: 4.5 mmol/L (ref 3.5–5.3)
Sodium: 140 mmol/L (ref 135–146)
Total Bilirubin: 0.6 mg/dL (ref 0.2–1.2)
Total Protein: 7.6 g/dL (ref 6.1–8.1)
eGFR: 45 mL/min/{1.73_m2} — ABNORMAL LOW (ref >=60)

## 2022-03-25 LAB — LIPID PANEL
Cholesterol: 213 mg/dL — ABNORMAL HIGH (ref <200)
HDL: 40 mg/dL (ref >=40)
LDL Cholesterol (Calc): 134 mg/dL (calc) — ABNORMAL HIGH
Non-HDL Cholesterol (Calc): 173 mg/dL (calc) — ABNORMAL HIGH (ref <130)
Total CHOL/HDL Ratio: 5.3 (calc) — ABNORMAL HIGH (ref <5.0)
Triglycerides: 244 mg/dL — ABNORMAL HIGH (ref <150)

## 2022-03-25 LAB — HEMOGLOBIN A1C
Hgb A1c MFr Bld: 5.9 % of total Hgb — ABNORMAL HIGH (ref <5.7)
Mean Plasma Glucose: 123 mg/dL
eAG (mmol/L): 6.8 mmol/L

## 2022-04-06 ENCOUNTER — Telehealth: Payer: Self-pay

## 2022-04-06 NOTE — Telephone Encounter (Signed)
LM-04/06/22-Pt. Returned call to call coordinator dustin but would not give his name or dob. Was able to pull pts phone number up and returned call. Pt. Answered call but upon introducing myself, pt. Disconnected call. Informed pt. Assistance line Dustin to let pt. Know I reached out to complete and assessment if pt. Calls back. Will try calling pt. At another time.   Total time spent: 2 min.    LM-04/06/22-Chart review started. Reviewing OV, Consults, Hospital visits, Labs and medication changes.  Chart review complete. Called pt. To complete HLD review call. Unable to reach pt. VM box is full.  Total time spent:12 min.

## 2022-04-07 ENCOUNTER — Encounter: Payer: Self-pay | Admitting: Gastroenterology

## 2022-04-07 NOTE — Telephone Encounter (Signed)
LM-04/07/22-Received call from pt around 11:33AM and the call was disconnected. Returned pts. Call to complete HLD review call and was unable to reach. Pts. VM box is full, unable to LVM. Will try calling pt. At another time.  Total time spent: 2 min.

## 2022-04-09 ENCOUNTER — Encounter: Payer: Self-pay | Admitting: Certified Registered Nurse Anesthetist

## 2022-04-13 NOTE — Telephone Encounter (Signed)
LM-04/13/22-Quickly reviewed patients chart and called to complete HLD review call. Spoke to pt. And he was very adamant about stopping all of his medications. Asked pt. If he noted any noticeable side effects from any medications, and pt. Informed me he has had weight loss (30-35 pounds) over the last year and at times some GI upset. Informed pt. That Metformin is sometimes prescribed for weight loss and that it can cause unpleasant GI side effects-but told pt. I could not tell him if that is what it is coming from, because a lot of medications have different side effects and other things could be causing his symptoms.  Pt. Stated he has been reading online all of his medications and the side effects. Pt. Is concerned because he has been taking medications for so long, he feels he should no longer be taking anything. Advised pt. To take medications as prescribed or talk to PCP first before stopping any medications. Pt. Stated he discussed w/ Lorenda Peck, NP at his [facility] of him quitting all of his medications, but I did not see any documentation of that. Offered to speak to CP and come up with a plan to discuss stopping his medications or get any recommendations regarding his medications, but pt. Declined. Pt. Stated he will discuss his medications at his next visit w/ Dr. Melford Aase in December. Asked pt. If he would like me to connect him w/ PCP, pt declined. Advised pt. To call if he has any questions/concerns. Pt. Verbalized understanding and agreed. Educated pt. On lifestyle changes he could make to help lower his cholesterol including- increasing his fiber intake, limiting processed foods, eating more fruits and vegetables, and exercising preferably 30 min. Daily.Informed pt. Walking is great exercise for the cardiovascular system. Pt. has been trying to walk daily, and avoiding sweets, soda, and increasing his water intake. Informed pt. I will send report to CP for review. Pt. Verbalized  understanding.  Total time spent: 33 min.

## 2022-04-15 ENCOUNTER — Encounter: Payer: Self-pay | Admitting: Gastroenterology

## 2022-04-15 ENCOUNTER — Ambulatory Visit (AMBULATORY_SURGERY_CENTER): Payer: PPO | Admitting: Gastroenterology

## 2022-04-15 VITALS — BP 150/74 | HR 61 | Temp 98.4°F | Resp 9 | Ht 68.5 in | Wt 159.0 lb

## 2022-04-15 DIAGNOSIS — D120 Benign neoplasm of cecum: Secondary | ICD-10-CM

## 2022-04-15 DIAGNOSIS — Z86010 Personal history of colonic polyps: Secondary | ICD-10-CM

## 2022-04-15 DIAGNOSIS — Z09 Encounter for follow-up examination after completed treatment for conditions other than malignant neoplasm: Secondary | ICD-10-CM

## 2022-04-15 DIAGNOSIS — E119 Type 2 diabetes mellitus without complications: Secondary | ICD-10-CM | POA: Diagnosis not present

## 2022-04-15 DIAGNOSIS — N183 Chronic kidney disease, stage 3 unspecified: Secondary | ICD-10-CM | POA: Diagnosis not present

## 2022-04-15 MED ORDER — SODIUM CHLORIDE 0.9 % IV SOLN: 500.0000 mL | INTRAVENOUS | Status: DC

## 2022-04-15 NOTE — Progress Notes (Signed)
Report given to PACU, vss 

## 2022-04-15 NOTE — Progress Notes (Signed)
Pt's states no medical or surgical changes since previsit or office visit. 

## 2022-04-15 NOTE — Progress Notes (Signed)
Called to room to assist during endoscopic procedure.  Patient ID and intended procedure confirmed with present staff. Received instructions for my participation in the procedure from the performing physician.  

## 2022-04-15 NOTE — Patient Instructions (Signed)
Read all of the handouts given to you by your recovery room nurse.  Resume all of your current medications.  YOU HAD AN ENDOSCOPIC PROCEDURE TODAY AT [facility]:   Refer to the procedure report that was given to you for any specific questions about what was found during the examination.  If the procedure report does not answer your questions, please call your gastroenterologist to clarify.  If you requested that your care partner not be given the details of your procedure findings, then the procedure report has been included in a sealed envelope for you to review at your convenience later.  YOU SHOULD EXPECT: Some feelings of bloating in the abdomen. Passage of more gas than usual.  Walking can help get rid of the air that was put into your GI tract during the procedure and reduce the bloating. If you had a lower endoscopy (such as a colonoscopy or flexible sigmoidoscopy) you may notice spotting of blood in your stool or on the toilet paper. If you underwent a bowel prep for your procedure, you may not have a normal bowel movement for a few days.  Please Note:  You might notice some irritation and congestion in your nose or some drainage.  This is from the oxygen used during your procedure.  There is no need for concern and it should clear up in a day or so.  SYMPTOMS TO REPORT IMMEDIATELY:  Following lower endoscopy (colonoscopy or flexible sigmoidoscopy):  Excessive amounts of blood in the stool  Significant tenderness or worsening of abdominal pains  Swelling of the abdomen that is new, acute  Fever of 100F or higher  For urgent or emergent issues, a gastroenterologist can be reached at any hour by calling [phone]. Do not use MyChart messaging for urgent concerns.    DIET:  We do recommend a small meal at first, but then you may proceed to your regular diet.  Drink plenty of fluids but you should avoid alcoholic beverages for 24 hours.  ACTIVITY:  You should plan to  take it easy for the rest of today and you should NOT DRIVE or use heavy machinery until tomorrow (because of the sedation medicines used during the test).    FOLLOW UP: Our staff will call the number listed on your records the next business day following your procedure.  We will call around 7:15- 8:00 am to check on you and address any questions or concerns that you may have regarding the information given to you following your procedure. If we do not reach you, we will leave a message.     If any biopsies were taken you will be contacted by phone or by letter within the next 1-3 weeks.  Please call us at [phone] if you have not heard about the biopsies in 3 weeks.    SIGNATURES/CONFIDENTIALITY: You and/or your care partner have signed paperwork which will be entered into your electronic medical record.  These signatures attest to the fact that that the information above on your After Visit Summary has been reviewed and is understood.  Full responsibility of the confidentiality of this discharge information lies with you and/or your care-partner.

## 2022-04-15 NOTE — Op Note (Signed)
[place] Patient Name: [name] Procedure Date: 04/15/2022 9:36 AM MRN: [id number] Endoscopist: Mallie Mussel L. Loletha Carrow , MD, [id number] Age: 71 Referring MD:  Date of Birth: [birth year] Gender: Male Account #: [id number] Procedure:                Colonoscopy Indications:              Surveillance: Personal history of adenomatous                            polyps on last colonoscopy > 3 years ago                           10/2017 - TA x 2 and SSP (all < 44m) - exam done                            for positive cologuard Medicines:                Monitored Anesthesia Care Procedure:                Pre-Anesthesia Assessment:                           - Prior to the procedure, a History and Physical                            was performed, and patient medications and                            allergies were reviewed. The patient's tolerance of                            previous anesthesia was also reviewed. The risks                            and benefits of the procedure and the sedation                            options and risks were discussed with the patient.                            All questions were answered, and informed consent                            was obtained. Prior Anticoagulants: The patient has                            taken no anticoagulant or antiplatelet agents. ASA                            Grade Assessment: II - A patient with mild systemic                            disease. After reviewing the risks and benefits,  the patient was deemed in satisfactory condition to                            undergo the procedure.                           After obtaining informed consent, the colonoscope                            was passed under direct vision. Throughout the                            procedure, the patient's blood pressure, pulse, and                            oxygen saturations were monitored continuously. The                             Olympus CF-HQ190L [id number]) Colonoscope was                            introduced through the anus and advanced to the the                            cecum, identified by appendiceal orifice and                            ileocecal valve. The colonoscopy was performed                            without difficulty. The patient tolerated the                            procedure well. The quality of the bowel                            preparation was good. The ileocecal valve,                            appendiceal orifice, and rectum were photographed.                            The bowel preparation used was GoLYTELY. Scope In: 9:46:13 AM Scope Out: 9:57:43 AM Scope Withdrawal Time: 0 hours 8 minutes 50 seconds  Total Procedure Duration: 0 hours 11 minutes 30 seconds  Findings:                 The perianal and digital rectal examinations were                            normal.                           A 6 mm polyp was found in the cecum. The polyp was  semi-sessile. The polyp was removed with a cold                            snare. Resection and retrieval were complete.                           Repeat examination of right colon under NBI                            performed.                           Internal hemorrhoids were found.                           The exam was otherwise without abnormality on                            direct and retroflexion views. Complications:            No immediate complications. Estimated Blood Loss:     Estimated blood loss was minimal. Impression:               - One 6 mm polyp in the cecum, removed with a cold                            snare. Resected and retrieved.                           - Internal hemorrhoids.                           - The examination was otherwise normal on direct                            and retroflexion views. Recommendation:           - Patient has a contact number  available for                            emergencies. The signs and symptoms of potential                            delayed complications were discussed with the                            patient. Return to normal activities tomorrow.                            Written discharge instructions were provided to the                            patient.                           - Resume previous diet.                           -  Continue present medications.                           - Await pathology results.                           - Repeat colonoscopy is recommended for                            surveillance. The colonoscopy date will be                            determined after pathology results from today's                            exam become available for review. Caylie Sandquist L. Loletha Carrow, MD 04/15/2022 10:01:14 AM This report has been signed electronically.

## 2022-04-15 NOTE — Progress Notes (Signed)
History and Physical:  This patient presents for endoscopic testing for: Encounter Diagnosis  Name Primary?   Personal history of colonic polyps Yes    TA x 2 and SSP May 2019  (that exam done for positive cologuard) Patient denies chronic abdominal pain, rectal bleeding, constipation or diarrhea.  Patient is otherwise without complaints or active issues today.   Past Medical History: Past Medical History:  Diagnosis Date   Allergy    Bone spur    Left Shoulder   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 ([place]) 07/18/2017   Fracture    right forearm   Gout    Hyperlipidemia    Hypertension    Other testicular hypofunction    PONV (postoperative nausea and vomiting)    Type II or unspecified type diabetes mellitus without mention of complication, not stated as uncontrolled      Past Surgical History: Past Surgical History:  Procedure Laterality Date   CYST REMOVAL HAND     ORIF RADIAL FRACTURE Right 07/18/2019   Procedure: OPEN REDUCTION INTERNAL FIXATION (ORIF) RADIUS AND ULNA FRACTURE;  Surgeon: Shona Needles, MD;  Location: [facility];  Service: Orthopedics;  Laterality: Right;   PROSTATE BIOPSY  2010   WRIST SURGERY Right 1998    Allergies: Allergies  Allergen Reactions   Crestor [Rosuvastatin] Diarrhea   Welchol [Colesevelam Hcl] Diarrhea   Zoloft [Sertraline Hcl] Other (See Comments)    unknown   Penicillins Other (See Comments)    Outpatient Meds: Current Outpatient Medications  Medication Sig Dispense Refill   Cholecalciferol (VITAMIN D3 PO) Take 5,000 Units by mouth daily.     Cyanocobalamin (VITAMIN B-12 PO) Take 1,000 mg by mouth daily.     enalapril (VASOTEC) 20 MG tablet Take 20 mg by mouth daily.     aspirin EC 81 MG tablet Take 81 mg by mouth daily. Swallow whole.     gabapentin (NEURONTIN) 800 MG tablet Take 800 mg by mouth 2 (two) times daily.     metFORMIN (GLUCOPHAGE-XR) 500 MG 24 hr tablet Take 500 mg by mouth daily with breakfast.      Current Facility-Administered Medications  Medication Dose Route Frequency Provider Last Rate Last Admin   0.9 %  sodium chloride infusion  500 mL Intravenous Continuous Danis, Estill Cotta III, MD          ___________________________________________________________________ Objective   Exam:  BP (!) 182/92   Pulse 70   Temp 98.4 F (36.9 C)   Ht 5' 8.5" (1.74 m)   Wt 159 lb (72.1 kg)   SpO2 100%   BMI 23.82 kg/m   CV: regular , S1/S2 Resp: clear to auscultation bilaterally, normal RR and effort noted GI: soft, no tenderness, with active bowel sounds.   Assessment: Encounter Diagnosis  Name Primary?   Personal history of colonic polyps Yes     Plan: Colonoscopy  The benefits and risks of the planned procedure were described in detail with the patient or (when appropriate) their health care proxy.  Risks were outlined as including, but not limited to, bleeding, infection, perforation, adverse medication reaction leading to cardiac or pulmonary decompensation, pancreatitis (if ERCP).  The limitation of incomplete mucosal visualization was also discussed.  No guarantees or warranties were given.    The patient is appropriate for an endoscopic procedure in the ambulatory setting.   - Wilfrid Lund, MD

## 2022-04-18 ENCOUNTER — Telehealth: Payer: Self-pay

## 2022-04-18 NOTE — Telephone Encounter (Signed)
  Follow up Call-     04/15/2022    9:04 AM  Call back number  Post procedure Call Back phone  # [phone]  Permission to leave phone message Yes     Patient questions:  Do you have a fever, pain , or abdominal swelling? No. Pain Score  0 *  Have you tolerated food without any problems? Yes.    Have you been able to return to your normal activities? Yes.    Do you have any questions about your discharge instructions: Diet   No. Medications  No. Follow up visit  No.  Do you have questions or concerns about your Care? No.  Actions: * If pain score is 4 or above: No action needed, pain <4.

## 2022-04-22 ENCOUNTER — Encounter: Payer: Self-pay | Admitting: Gastroenterology

## 2022-06-10 ENCOUNTER — Ambulatory Visit: Payer: PPO | Admitting: Internal Medicine

## 2022-06-10 ENCOUNTER — Ambulatory Visit: Payer: PPO | Admitting: Nurse Practitioner

## 2022-06-22 ENCOUNTER — Encounter: Payer: Self-pay | Admitting: Internal Medicine

## 2022-06-22 ENCOUNTER — Ambulatory Visit (INDEPENDENT_AMBULATORY_CARE_PROVIDER_SITE_OTHER): Payer: PPO | Admitting: Internal Medicine

## 2022-06-22 VITALS — BP 136/74 | HR 64 | Temp 97.9°F | Resp 16 | Ht 68.5 in | Wt 153.4 lb

## 2022-06-22 DIAGNOSIS — E1122 Type 2 diabetes mellitus with diabetic chronic kidney disease: Secondary | ICD-10-CM

## 2022-06-22 DIAGNOSIS — E785 Hyperlipidemia, unspecified: Secondary | ICD-10-CM | POA: Diagnosis not present

## 2022-06-22 DIAGNOSIS — R1031 Right lower quadrant pain: Secondary | ICD-10-CM | POA: Diagnosis not present

## 2022-06-22 DIAGNOSIS — R4189 Other symptoms and signs involving cognitive functions and awareness: Secondary | ICD-10-CM | POA: Diagnosis not present

## 2022-06-22 DIAGNOSIS — E559 Vitamin D deficiency, unspecified: Secondary | ICD-10-CM | POA: Diagnosis not present

## 2022-06-22 DIAGNOSIS — Z79899 Other long term (current) drug therapy: Secondary | ICD-10-CM

## 2022-06-22 DIAGNOSIS — E1140 Type 2 diabetes mellitus with diabetic neuropathy, unspecified: Secondary | ICD-10-CM

## 2022-06-22 DIAGNOSIS — I10 Essential (primary) hypertension: Secondary | ICD-10-CM | POA: Diagnosis not present

## 2022-06-22 DIAGNOSIS — E1169 Type 2 diabetes mellitus with other specified complication: Secondary | ICD-10-CM

## 2022-06-22 DIAGNOSIS — G720 Drug-induced myopathy: Secondary | ICD-10-CM | POA: Diagnosis not present

## 2022-06-22 DIAGNOSIS — N1832 Chronic kidney disease, stage 3b: Secondary | ICD-10-CM | POA: Diagnosis not present

## 2022-06-22 DIAGNOSIS — M1000 Idiopathic gout, unspecified site: Secondary | ICD-10-CM | POA: Diagnosis not present

## 2022-06-22 DIAGNOSIS — T466X5A Adverse effect of antihyperlipidemic and antiarteriosclerotic drugs, initial encounter: Secondary | ICD-10-CM | POA: Diagnosis not present

## 2022-06-22 NOTE — Patient Instructions (Signed)

## 2022-06-22 NOTE — Progress Notes (Addendum)
Future Appointments  Date Time Provider Department  06/22/2022  3:30 PM Unk Pinto, MD [facility]  12/07/2022 11:00 AM Unk Pinto, MD [facility]  03/09/2023  4:00 PM Alycia Rossetti, NP [facility]    History of Present Illness:       This very nice 72 y.o.  WWM presents for 6 month follow up with HTN, HLD, Pre-Diabetes and Vitamin D Deficiency.        Today patient also relates that he is having difficulty with short term memory recall and relates that his wife requests neurology  referral  for evaluation.  Patient also relates several week prodrome of RLQ aching discomfort. He denies any associated N/V, D/C, blood or mucus in BM's.   Wt Readings from Last 3 Encounters:  06/22/22 153 lb 6.4 oz  04/15/22 159 lb   03/24/22 156 lb 9.6 oz          Patient is treated for HTN  since  & BP has been controlled at home. Today's BP is at goal -  136/74. Patient has had no complaints of any cardiac type chest pain, palpitations, dyspnea / orthopnea / PND, dizziness, claudication, or dependent edema.       Hyperlipidemia is not controlled with diet & patient is intolerant to Statins . Patient denies myalgias or other med SE's. Last Lipids were not at goal :  Lab Results  Component Value Date   CHOL 213 (H) 03/24/2022   HDL 40 03/24/2022   LDLCALC 134 (H) 03/24/2022   TRIG 244 (H) 03/24/2022   CHOLHDL 5.3 (H) 03/24/2022     Also, the patient has history of T2_NIDDM w/ PSN and has had no symptoms of reactive hypoglycemia, diabetic polys, paresthesias or visual blurring.  Patient relates that he has tapered off of his Metformin with better diet & weight loss.  Last A1c was near goal :  Lab Results  Component Value Date   HGBA1C 5.9 (H) 03/24/2022                                                          Further, the patient also has history of Vitamin D Deficiency and supplements vitamin D. Last vitamin D was at goal :   Lab Results  Component Value Date   VD25OH 77  12/02/2021     Current Outpatient Medications on File Prior to Visit  Medication Sig   aspirin EC 81 MG tablet Take n daily.   VITAMIN D3  Take 5,000 Units daily.   VITAMIN B-12  Take 1,000 mg daily.   enalapril 20 MG tablet Take  daily.   gabapentin  800 MG tablet Take 800 mg b (two) times daily.   metFORMIN-XR  500 MG  Take 500 mg by mouth daily with breakfast.     Medications  .  off     enalapril  20 MG tablet, Take daily.    aspirin EC 81 MG tablet, Take   daily. Swallow whole.    VITAMIN B-12 , Take 1,000 mg daily.    VITAMIN D3 5,000 Units, Take  daily.    gabapentin  800 MG tablet, Take 2 (two) times daily.   Allergies  Allergen Reactions   Crestor [Rosuvastatin] Diarrhea   Welchol [Colesevelam Hcl] Diarrhea   Zoloft [Sertraline Hcl] Other (  See Comments)    unknown   Penicillins Other (See Comments)     PMHx:   Past Medical History:  Diagnosis Date   Allergy    Bone spur    Left Shoulder   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 ([place]) 07/18/2017   Fracture    right forearm   Gout    Hyperlipidemia    Hypertension    Other testicular hypofunction    PONV (postoperative nausea and vomiting)    Type II or unspecified type diabetes mellitus       Immunization History  Administered Date(s) Administered   PPD Test 02/07/2014, 02/16/2015   Pneumococcal -13 04/12/2017   Pneumococcal -23 11/01/2010, 05/29/2018   Td 12/25/2012   Zoster, Live 12/25/2012     Past Surgical History:  Procedure Laterality Date   CYST REMOVAL HAND     ORIF RADIAL FRACTURE Right 07/18/2019   Procedure: OPEN REDUCTION INTERNAL FIXATION (ORIF) RADIUS AND ULNA FRACTURE;  Surgeon: Shona Needles, MD;  Location: [facility];  Service: Orthopedics;  Laterality: Right;   PROSTATE BIOPSY  2010   WRIST SURGERY Right 1998    FHx:    Reviewed / unchanged  SHx:    Reviewed / unchanged   Systems Review:  Constitutional: Denies fever, chills, wt changes, headaches, insomnia,  fatigue, night sweats, change in appetite. Eyes: Denies redness, blurred vision, diplopia, discharge, itchy, watery eyes.  ENT: Denies discharge, congestion, post nasal drip, epistaxis, sore throat, earache, hearing loss, dental pain, tinnitus, vertigo, sinus pain, snoring.  CV: Denies chest pain, palpitations, irregular heartbeat, syncope, dyspnea, diaphoresis, orthopnea, PND, claudication or edema. Respiratory: denies cough, dyspnea, DOE, pleurisy, hoarseness, laryngitis, wheezing.  Gastrointestinal: Denies dysphagia, odynophagia, heartburn, reflux, water brash, abdominal pain or cramps, nausea, vomiting, bloating, diarrhea, constipation, hematemesis, melena, hematochezia  or hemorrhoids. Genitourinary: Denies dysuria, frequency, urgency, nocturia, hesitancy, discharge, hematuria or flank pain. Musculoskeletal: Denies arthralgias, myalgias, stiffness, jt. swelling, pain, limping or strain/sprain.  Skin: Denies pruritus, rash, hives, warts, acne, eczema or change in skin lesion(s). Neuro: No weakness, tremor, incoordination, spasms, paresthesia or pain. Psychiatric: Denies confusion, memory loss or sensory loss. Endo: Denies change in weight, skin or hair change.  Heme/Lymph: No excessive bleeding, bruising or enlarged lymph nodes.  Physical Exam  BP 136/74   Pulse 64   Temp 97.9 F (36.6 C)   Resp 16   Ht 5' 8.5" (1.74 m)   Wt 153 lb 6.4 oz (69.6 kg)   SpO2 99%   BMI 22.99 kg/m   Appears  well nourished, well groomed  and in no distress.  Eyes: PERRLA, EOMs, conjunctiva no swelling or erythema. Sinuses: No frontal/maxillary tenderness ENT/Mouth: EAC's clear, TM's nl w/o erythema, bulging. Nares clear w/o erythema, swelling, exudates. Oropharynx clear without erythema or exudates. Oral hygiene is good. Tongue normal, non obstructing. Hearing intact.  Neck: Supple. Thyroid not palpable. Car 2+/2+ without bruits, nodes or JVD. Chest: Respirations nl with BS clear & equal w/o rales,  rhonchi, wheezing or stridor.  Cor: Heart sounds normal w/ regular rate and rhythm without sig. murmurs, gallops, clicks or rubs. Peripheral pulses normal and equal  without edema.  Abdomen: Soft & bowel sounds normal.  Point tenderness and guarding w/o rebound in the RLQ. No masses , hernias,  or organomegaly is evident  Lymphatics: Unremarkable.  Musculoskeletal: Full ROM all peripheral extremities, joint stability, 5/5 strength and normal gait.  Skin: Warm, dry without exposed rashes, lesions or ecchymosis apparent.  Neuro: Cranial  nerves intact, reflexes equal bilaterally. Sensory-motor testing grossly intact. Tendon reflexes grossly intact. No obvious focal neuro signs.  Pysch: Alert & oriented x 3.  Flat affect. Insight and judgement nl & appropriate. No ideations.  Assessment and Plan:   1. Essential hypertension  - Continue medication, monitor blood pressure at home.  - Continue DASH diet.  Reminder to go to the ER if any CP,  SOB, nausea, dizziness, severe HA, changes vision/speech.   - CBC with Differential/Platelet - COMPLETE METABOLIC PANEL WITH GFR - Magnesium - TSH   2. Hyperlipidemia associated with type 2 diabetes mellitus ([place])  - Continue diet/meds, exercise & lifestyle modifications.  - Continue monitor periodic cholesterol/liver & renal functions    - Lipid panel - TSH   3. Type 2 diabetes mellitus with stage 3b chronic kidney  disease, without long-term current use of insulin ([facility])  Continue diet, exercise  - Lifestyle modifications.  - Monitor appropriate labs   - Hemoglobin A1c - Insulin, random   4. Cognitive impairment  - Ambulatory referral to Neurology  - Patient also advised to try taper & leave off his Gabapentin 800 mg bid  speculating that it may be contributory to his dulled mentation    5. Right lower quadrant abdominal pain  - CT Abdomen Pelvis Wo Contrast; Future   6. Vitamin D deficiency  - Continue supplementation   -  VITAMIN D 25 Hydroxy   7. Statin myopathy  - Lipid panel   8. Diabetic neuropathy, painful ([place])   9. Idiopathic gout  - Uric acid   10. Medication management  - CBC with Differential/Platelet - COMPLETE METABOLIC PANEL WITH GFR - Magnesium - Lipid panel - TSH - Hemoglobin A1c - Insulin, random - VITAMIN D 25 Hydroxy  - Uric acid           Discussed  regular exercise, BP monitoring, weight control to achieve/maintain BMI less than 25 and discussed med and SE's. Recommended labs to assess /monitor clinical status .  I discussed the assessment and treatment plan with the patient. The patient was provided an opportunity to ask questions and all were answered. The patient agreed with the plan and demonstrated an understanding of the instructions.  I provided over 30 minutes of exam, counseling, chart review and  complex critical decision making.        The patient was advised to call back or seek an in-person evaluation if the symptoms worsen or if the condition fails to improve as anticipated.   Kirtland Bouchard, MD

## 2022-06-23 LAB — CBC WITH DIFFERENTIAL/PLATELET
Absolute Monocytes: 563 cells/uL (ref 200–950)
Basophils Absolute: 70 cells/uL (ref 0–200)
Basophils Relative: 0.8 %
Eosinophils Absolute: 194 cells/uL (ref 15–500)
Eosinophils Relative: 2.2 %
HCT: 39.4 % (ref 38.5–50.0)
Hemoglobin: 13.4 g/dL (ref 13.2–17.1)
Lymphs Abs: 1443 cells/uL (ref 850–3900)
MCH: 27.7 pg (ref 27.0–33.0)
MCHC: 34 g/dL (ref 32.0–36.0)
MCV: 81.6 fL (ref 80.0–100.0)
MPV: 9.7 fL (ref 7.5–12.5)
Monocytes Relative: 6.4 %
Neutro Abs: 6530 cells/uL (ref 1500–7800)
Neutrophils Relative %: 74.2 %
Platelets: 269 10*3/uL (ref 140–400)
RBC: 4.83 10*6/uL (ref 4.20–5.80)
RDW: 13.2 % (ref 11.0–15.0)
Total Lymphocyte: 16.4 %
WBC: 8.8 10*3/uL (ref 3.8–10.8)

## 2022-06-23 LAB — COMPLETE METABOLIC PANEL WITH GFR
AG Ratio: 1.9 (calc) (ref 1.0–2.5)
ALT: 19 U/L (ref 9–46)
AST: 22 U/L (ref 10–35)
Albumin: 4.4 g/dL (ref 3.6–5.1)
Alkaline phosphatase (APISO): 57 U/L (ref 35–144)
BUN/Creatinine Ratio: 16 (calc) (ref 6–22)
BUN: 26 mg/dL — ABNORMAL HIGH (ref 7–25)
CO2: 24 mmol/L (ref 20–32)
Calcium: 9.1 mg/dL (ref 8.6–10.3)
Chloride: 106 mmol/L (ref 98–110)
Creat: 1.66 mg/dL — ABNORMAL HIGH (ref 0.70–1.28)
Globulin: 2.3 g/dL (calc) (ref 1.9–3.7)
Glucose, Bld: 86 mg/dL (ref 65–99)
Potassium: 4.4 mmol/L (ref 3.5–5.3)
Sodium: 141 mmol/L (ref 135–146)
Total Bilirubin: 0.5 mg/dL (ref 0.2–1.2)
Total Protein: 6.7 g/dL (ref 6.1–8.1)
eGFR: 44 mL/min/{1.73_m2} — ABNORMAL LOW (ref >=60)

## 2022-06-23 LAB — VITAMIN D 25 HYDROXY (VIT D DEFICIENCY, FRACTURES): Vit D, 25-Hydroxy: 71 ng/mL (ref 30–100)

## 2022-06-23 LAB — HEMOGLOBIN A1C
Hgb A1c MFr Bld: 6.6 % of total Hgb — ABNORMAL HIGH (ref <5.7)
Mean Plasma Glucose: 143 mg/dL
eAG (mmol/L): 7.9 mmol/L

## 2022-06-23 LAB — LIPID PANEL
Cholesterol: 191 mg/dL (ref <200)
HDL: 43 mg/dL (ref >=40)
LDL Cholesterol (Calc): 122 mg/dL (calc) — ABNORMAL HIGH
Non-HDL Cholesterol (Calc): 148 mg/dL (calc) — ABNORMAL HIGH (ref <130)
Total CHOL/HDL Ratio: 4.4 (calc) (ref <5.0)
Triglycerides: 147 mg/dL (ref <150)

## 2022-06-23 LAB — TSH: TSH: 1.16 mIU/L (ref 0.40–4.50)

## 2022-06-23 LAB — INSULIN, RANDOM: Insulin: 6.1 u[IU]/mL

## 2022-06-23 LAB — MAGNESIUM: Magnesium: 1.8 mg/dL (ref 1.5–2.5)

## 2022-06-23 LAB — URIC ACID: Uric Acid, Serum: 7.6 mg/dL (ref 4.0–8.0)

## 2022-06-23 NOTE — Progress Notes (Signed)
<><><><><><><><><><><><><><><><><><><><><><><><><><><><><><><><><> <><><><><><><><><><><><><><><><><><><><><><><><><><><><><><><><><>  - Kidney functions still sl depressed in [place] 3b -   - Kidney functions still look a little dehydrated  - It Is EXTREMELY IMPORTANT    to drink adequate amounts of fluids to prevent permanent damage    - Recommend drink at least 6 bottles (16 ounces) of fluids /water /day =                                                                                                                96 Oz ~100 oz  - 100 oz = 3,000 cc or 3 liters / day  - >>                                                          That's 1 &1/2 bottles of a 2 liter soda bottle /day !   <><><><><><><><><><><><><><><><><><><><><><><><><><><><><><><><><>  - Total Chol = 191   is increased  risk for Heart Attack /Stroke /Vascular Dementia     ( Ideal or Goal is less than 180 ! )  & - Bad /Dangerous LDL Chol =   122 - way way too high  - - >> Sitting on a time Bomb !     ( Ideal or Goal is less than 70 ! )   - Treating with meds to lower Cholesterol is treating the result                                          & NOT treating the cause  - The cause is Bad Diet !  - But need to go ahead & start meds until get on a better diet to try &                                                          reverse some of the Damage already done   - Read or listen to   Dr Wyman Songster 's book    " How Not to Die ! "    - Recommend a stricter plant based low cholesterol diet   - Cholesterol only comes from animal sources                                                                 - ie. meat, dairy, egg yolks  -  Eat all the vegetables you want.  - Avoid Meat, Avoid Meat , Avoid Meat  ! ! !                                                  -especially red meat - Beef AND Pork  - Avoid cheese & dairy - milk & ice cream.    - Cheese is the most concentrated form of trans-fats which                                                    is the worst thing to clog up our arteries.   - Veggie cheese is OK which can be found in                                              the fresh produce section at                                                          [employer] or [employer] or [employer] <><><><><><><><><><><><><><><><><><><><><><><><><><><><><><><><><>  -  Strongly recommend that you restart yor Metformin 2 x/day which                                                                                                will help your Cholesterol  <><><><><><><><><><><><><><><><><><><><><><><><><><><><><><><><><>  -  Since A1c has gone back up from 5.9% to now 6.6%  !  <><><><><><><><><><><><><><><><><><><><><><><><><><><><><><><><><> <><><><><><><><><><><><><><><><><><><><><><><><><><><><><><><><><>  -   Magnesium = 1.8  is  very  low - goal is betw 2.0 - 2.5,   - So........Marland Kitchen  Recommend that you take a Magnesium 500 mg table 2 x / day with meals    - also important to eat lots of  leafy green vegetables   - spinach - Kale - collards - greens - okra - asparagus - broccoli                                                                                 - quinoa - squash - almonds   - black, red, white beans -  peas - green beans <><><><><><><><><><><><><><><><><><><><><><><><><><><><><><><><><> <><><><><><><><><><><><><><><><><><><><><><><><><><><><><><><><><>  -  Vitamin D = 71 - Excellent - Please  keep dose same  <><><><><><><><><><><><><><><><><><><><><><><><><><><><><><><><><>  -  Uric acid / Gout level is barely Normal - So please continue Allopurinol  7   Avoid animal proteins , ie meat !  <><><><><><><><><><><><><><><><><><><><><><><><><><><><><><><><><>  -  All Else - CBC  - Electrolytes - Liver  & Thyroid    - all  Normal /  OK <><><><><><><><><><><><><><><><><><><><><><><><><><><><><><><><><>              -                   <><><><><><><><><><><><><><><><><><><><><><><><><><><><><><><><><>  -

## 2022-06-28 ENCOUNTER — Telehealth: Payer: Self-pay

## 2022-06-28 NOTE — Telephone Encounter (Signed)
LM-06/28/22-Chart review started. Reviewing OV, Consults, Hospital visits, Labs and medication changes.  Chart review complete. Called pt. To complete HTN review call. Pt. Did not have any BP readings written down. Advised pt. To check BP at least 3 times this week and to keep a log of readings and we will review them next week. Pt. Verbalized understanding and agreed.  (20 min.)

## 2022-06-29 ENCOUNTER — Ambulatory Visit
Admission: RE | Admit: 2022-06-29 | Discharge: 2022-06-29 | Disposition: A | Discharge status: Home / Self Care | Payer: PPO | Source: Ambulatory Visit | Attending: Internal Medicine | Admitting: Internal Medicine

## 2022-06-29 ENCOUNTER — Other Ambulatory Visit: Payer: Self-pay | Admitting: Internal Medicine

## 2022-06-29 DIAGNOSIS — K5900 Constipation, unspecified: Secondary | ICD-10-CM | POA: Diagnosis not present

## 2022-06-29 DIAGNOSIS — R109 Unspecified abdominal pain: Secondary | ICD-10-CM | POA: Diagnosis not present

## 2022-06-29 DIAGNOSIS — N200 Calculus of kidney: Secondary | ICD-10-CM | POA: Diagnosis not present

## 2022-06-29 DIAGNOSIS — R1031 Right lower quadrant pain: Secondary | ICD-10-CM

## 2022-06-29 DIAGNOSIS — E1169 Type 2 diabetes mellitus with other specified complication: Secondary | ICD-10-CM

## 2022-06-29 MED ORDER — EZETIMIBE 10 MG PO TABS: ORAL_TABLET | ORAL | 3 refills | Status: DC

## 2022-06-29 NOTE — Progress Notes (Signed)
<><><><><><><><><><><><><><><><><><><><><><><><><><><><><><><><><> <><><><><><><><><><><><><><><><><><><><><><><><><><><><><><><><><>  -   Abd CT scan returned OK   - No sign of cancers or internal organ abnormalities.   - is a good report   <><><><><><><><><><><><><><><><><><><><><><><><><><><><><><><><><> <><><><><><><><><><><><><><><><><><><><><><><><><><><><><><><><><>

## 2022-07-20 DIAGNOSIS — E785 Hyperlipidemia, unspecified: Secondary | ICD-10-CM

## 2022-07-20 DIAGNOSIS — N1832 Chronic kidney disease, stage 3b: Secondary | ICD-10-CM

## 2022-07-20 DIAGNOSIS — E1169 Type 2 diabetes mellitus with other specified complication: Secondary | ICD-10-CM

## 2022-07-20 DIAGNOSIS — E1122 Type 2 diabetes mellitus with diabetic chronic kidney disease: Secondary | ICD-10-CM

## 2022-08-04 ENCOUNTER — Telehealth: Payer: Self-pay

## 2022-08-04 NOTE — Progress Notes (Deleted)
Date: 2/15/240    Patient Name:[name], [name]   DOB:2050-12-14   Review office visits, consults, Medication changes and Chronic conditions for Focus Pharmacist Outreach with [facility] on 08/09/22    Confirm visit date: Have you seen any providers since your last visit or any hospitalizations? Have there been any changes to your medications since your last visit? (Med. Adherence, care gaps) Have you had any problems with getting your medications from your pharmacy? (Copay barriers) What is your top health concern you'd like to discuss for your upcoming visit? HC: Last Care Plan date

## 2022-08-04 NOTE — Telephone Encounter (Signed)
error 

## 2022-08-10 ENCOUNTER — Ambulatory Visit: Payer: PPO | Admitting: Neurology

## 2022-08-10 ENCOUNTER — Encounter: Payer: Self-pay | Admitting: Neurology

## 2022-08-10 VITALS — BP 157/71 | HR 70 | Ht 70.0 in | Wt 149.0 lb

## 2022-08-10 DIAGNOSIS — F0280 Dementia in other diseases classified elsewhere without behavioral disturbance: Secondary | ICD-10-CM

## 2022-08-10 DIAGNOSIS — G309 Alzheimer's disease, unspecified: Secondary | ICD-10-CM

## 2022-08-10 DIAGNOSIS — E519 Thiamine deficiency, unspecified: Secondary | ICD-10-CM

## 2022-08-10 DIAGNOSIS — R413 Other amnesia: Secondary | ICD-10-CM

## 2022-08-10 DIAGNOSIS — G4719 Other hypersomnia: Secondary | ICD-10-CM | POA: Diagnosis not present

## 2022-08-10 DIAGNOSIS — R5383 Other fatigue: Secondary | ICD-10-CM | POA: Diagnosis not present

## 2022-08-10 DIAGNOSIS — E538 Deficiency of other specified B group vitamins: Secondary | ICD-10-CM

## 2022-08-10 DIAGNOSIS — R0683 Snoring: Secondary | ICD-10-CM | POA: Diagnosis not present

## 2022-08-10 MED ORDER — DONEPEZIL HCL 5 MG PO TABS: 5.0000 mg | ORAL_TABLET | Freq: Every day | ORAL | 6 refills | Status: DC

## 2022-08-10 NOTE — Progress Notes (Unsigned)
ESS 9, FSS 26.

## 2022-08-10 NOTE — Patient Instructions (Addendum)
- Snores significantly, nods off, memory loss, frequent awakenings; Sleep study - MRI brain w/wo contrast: for reversible causes of dementia - Bloodwork: including genetic testing and blood markers for alzheimer - If bloodwork is concenring for alzheimer's we will do further testing - Pet scan for amyloid in the brain, the major proteins that "clog" brain - [facility] neurocognitive testing - Start aricept/Donepezil at 67m and then if no side effects and increase to 157m- for memory loss - Next medication I Namenda/Memantine - NEW medication Lecanemab  Sleep Apnea Sleep apnea is a condition in which breathing pauses or becomes shallow during sleep. People with sleep apnea usually snore loudly. They may have times when they gasp and stop breathing for 10 seconds or more during sleep. This may happen many times during the night. Sleep apnea disrupts your sleep and keeps your body from getting the rest that it needs. This condition can increase your risk of certain health problems, including: Heart attack. Stroke. Obesity. Type 2 diabetes. Heart failure. Irregular heartbeat. High blood pressure. The goal of treatment is to help you breathe normally again. What are the causes?  The most common cause of sleep apnea is a collapsed or blocked airway. There are three kinds of sleep apnea: Obstructive sleep apnea. This kind is caused by a blocked or collapsed airway. Central sleep apnea. This kind happens when the part of the brain that controls breathing does not send the correct signals to the muscles that control breathing. Mixed sleep apnea. This is a combination of obstructive and central sleep apnea. What increases the risk? You are more likely to develop this condition if you: Are overweight. Smoke. Have a smaller than normal airway. Are older. Are male. Drink alcohol. Take sedatives or tranquilizers. Have a family history of sleep apnea. Have a tongue or tonsils that are larger than  normal. What are the signs or symptoms? Symptoms of this condition include: Trouble staying asleep. Loud snoring. Morning headaches. Waking up gasping. Dry mouth or sore throat in the morning. Daytime sleepiness and tiredness. If you have daytime fatigue because of sleep apnea, you may be more likely to have: Trouble concentrating. Forgetfulness. Irritability or mood swings. Personality changes. Feelings of depression. Sexual dysfunction. This may include loss of interest if you are male, or erectile dysfunction if you are male. How is this diagnosed? This condition may be diagnosed with: A medical history. A physical exam. A series of tests that are done while you are sleeping (sleep study). These tests are usually done in a sleep lab, but they may also be done at home. How is this treated? Treatment for this condition aims to restore normal breathing and to ease symptoms during sleep. It may involve managing health issues that can affect breathing, such as high blood pressure or obesity. Treatment may include: Sleeping on your side. Using a decongestant if you have nasal congestion. Avoiding the use of depressants, including alcohol, sedatives, and narcotics. Losing weight if you are overweight. Making changes to your diet. Quitting smoking. Using a device to open your airway while you sleep, such as: An oral appliance. This is a custom-made mouthpiece that shifts your lower jaw forward. A continuous positive airway pressure (CPAP) device. This device blows air through a mask when you breathe out (exhale). A nasal expiratory positive airway pressure (EPAP) device. This device has valves that you put into each nostril. A bi-level positive airway pressure (BIPAP) device. This device blows air through a mask when you breathe in (  inhale) and breathe out (exhale). Having surgery if other treatments do not work. During surgery, excess tissue is removed to create a wider  airway. Follow these instructions at home: Lifestyle Make any lifestyle changes that your health care provider recommends. Eat a healthy, well-balanced diet. Take steps to lose weight if you are overweight. Avoid using depressants, including alcohol, sedatives, and narcotics. Do not use any products that contain nicotine or tobacco. These products include cigarettes, chewing tobacco, and vaping devices, such as e-cigarettes. If you need help quitting, ask your health care provider. General instructions Take over-the-counter and prescription medicines only as told by your health care provider. If you were given a device to open your airway while you sleep, use it only as told by your health care provider. If you are having surgery, make sure to tell your health care provider you have sleep apnea. You may need to bring your device with you. Keep all follow-up visits. This is important. Contact a health care provider if: The device that you received to open your airway during sleep is uncomfortable or does not seem to be working. Your symptoms do not improve. Your symptoms get worse. Get help right away if: You develop: Chest pain. Shortness of breath. Discomfort in your back, arms, or stomach. You have: Trouble speaking. Weakness on one side of your body. Drooping in your face. These symptoms may represent a serious problem that is an emergency. Do not wait to see if the symptoms will go away. Get medical help right away. Call your local emergency services (911 in the [place] Do not drive yourself to the hospital. Summary Sleep apnea is a condition in which breathing pauses or becomes shallow during sleep. The most common cause is a collapsed or blocked airway. The goal of treatment is to restore normal breathing and to ease symptoms during sleep. This information is not intended to replace advice given to you by your health care provider. Make sure you discuss any questions you have with  your health care provider. Document Revised: 01/13/2021 Document Reviewed: 05/15/2020 Elsevier Patient Education  [place]  Dementia Dementia is a condition that affects the way the brain functions. It often affects memory and thinking. Usually, dementia gets worse with time and cannot be reversed (progressive dementia). There are many types of dementia, including: Alzheimer's disease. This type is the most common. Vascular dementia. This type may happen as the result of a stroke. Lewy body dementia. This type may happen to people who have Parkinson's disease. Frontotemporal dementia. This type is caused by damage to nerve cells (neurons) in certain parts of the brain. Some people may be affected by more than one type of dementia. This is called mixed dementia. What are the causes? Dementia is caused by damage to cells in the brain. The area of the brain and the types of cells damaged determine the type of dementia. Usually, this damage is irreversible or cannot be undone. Some examples of irreversible causes include: Conditions that affect the blood vessels of the brain, such as diabetes, heart disease, or blood vessel disease. Genetic mutations. In some cases, changes in the brain may be caused by another condition and can be reversed or slowed. Some examples of reversible causes include: Injury to the brain. Certain medicines. Infection, such as meningitis. Metabolic problems, such as vitamin B12 deficiency or thyroid disease. Pressure on the brain, such as from a tumor, blood clot, or too much fluid in the brain (hydrocephalus). Autoimmune diseases that affect  the brain or arteries, such as limbic encephalitis or vasculitis. What are the signs or symptoms? Symptoms of dementia depend on the type of dementia. Common signs of dementia include problems with remembering, thinking, problem solving, decision making, and communicating. These signs develop slowly or get worse with time.  This may include: Problems remembering events or people. Having trouble taking a bath or putting clothes on. Forgetting appointments or forgetting to pay bills. Difficulty planning and preparing meals. Having trouble speaking. Getting lost easily. Changes in behavior or mood. How is this diagnosed? This condition is diagnosed by a specialist (neurologist). It is diagnosed based on the history of your symptoms, your medical history, a physical exam, and tests. Tests may include: Tests to evaluate brain function, such as memory tests, cognitive tests, and other tests. Lab tests, such as blood or urine tests. Imaging tests, such as a CT scan, a PET scan, or an MRI. Genetic testing. This may be done if other family members have a diagnosis of certain types of dementia. Your health care provider will talk with you and your family, friends, or caregivers about your history and symptoms. How is this treated? Treatment for this condition depends on the cause of the dementia. Progressive dementias, such as Alzheimer's disease, cannot be cured, but there may be treatments that help to manage symptoms. Treatment might involve taking medicines that may help to: Control the dementia. Slow down the progression of the dementia. Manage symptoms. In some cases, treating the cause of your dementia can improve symptoms, reverse symptoms, or slow down how quickly your dementia becomes worse. Your health care provider can direct you to support groups, organizations, and other health care providers who can help with decisions about your care. Follow these instructions at home: Medicines Take over-the-counter and prescription medicines only as told by your health care provider. Use a pill organizer or pill reminder to help you manage your medicines. Avoid taking medicines that can affect thinking, such as pain medicines or sleeping medicines. Lifestyle Make healthy lifestyle choices. Be physically active as  told by your health care provider. Do not use any products that contain nicotine or tobacco, such as cigarettes, e-cigarettes, and chewing tobacco. If you need help quitting, ask your health care provider. Do not drink alcohol. Practice stress-management techniques when you get stressed. Spend time with other people. Make sure to get quality sleep. These tips can help you get a good night's rest: Avoid napping during the day. Keep your sleeping area dark and cool. Avoid exercising during the few hours before you go to bed. Avoid caffeine products in the evening. Eating and drinking Drink enough fluid to keep your urine pale yellow. Eat a healthy diet. General instructions  Work with your health care provider to determine what you need help with and what your safety needs are. Talk with your health care provider about whether it is safe for you to drive. If you were given a bracelet that identifies you as a person with memory loss or tracks your location, make sure to wear it at all times. Work with your family to make important decisions, such as advance directives, medical power of attorney, or a living will. Keep all follow-up visits. This is important. Where to find more information Alzheimer's Association: [employer] National Institute on Aging: DVDEnthusiasts.nl World Health Organization: [employer] Contact a health care provider if: You have any new or worsening symptoms. You have problems with choking or swallowing. Get help right away if: You feel depressed or  sad, or feel that you want to harm yourself. Your family members become concerned for your safety. If you ever feel like you may hurt yourself or others, or have thoughts about taking your own life, get help right away. Go to your nearest emergency department or: Call your local emergency services (911 in the [place] Call a suicide crisis helpline, such as the [place] at  501 383 7529 or 988 in the [place]. This is open 24 hours a day in the [place] Text the Crisis Text Line at 289-513-4601 (in the [place] Summary Dementia is a condition that affects the way the brain functions. Dementia often affects memory and thinking. Usually, dementia gets worse with time and cannot be reversed (progressive dementia). Treatment for this condition depends on the cause of the dementia. Work with your health care provider to determine what you need help with and what your safety needs are. Your health care provider can direct you to support groups, organizations, and other health care providers who can help with decisions about your care. This information is not intended to replace advice given to you by your health care provider. Make sure you discuss any questions you have with your health care provider. Document Revised: 12/30/2020 Document Reviewed: 10/21/2019 Elsevier Patient Education  [place].  Donepezil Tablets What is this medication? DONEPEZIL (doe NEP e zil) treats memory loss and confusion (dementia) in people who have Alzheimer disease. It works by improving attention, memory, and the ability to engage in daily activities. It is not a cure for dementia or Alzheimer disease. This medicine may be used for other purposes; ask your health care provider or pharmacist if you have questions. COMMON BRAND NAME(S): Aricept What should I tell my care team before I take this medication? They need to know if you have any of these conditions: Asthma or other lung disease Difficulty passing urine Head injury Heart disease History of irregular heartbeat Liver disease Seizures (convulsions) Stomach or intestinal disease, ulcers or stomach bleeding An unusual or allergic reaction to donepezil, other medications, foods, dyes, or preservatives Pregnant or trying to get pregnant Breast-feeding How should I use this medication? Take this medication by mouth with a glass of  water. Follow the directions on the prescription label. You may take this medication with or without food. Take this medication at regular intervals. This medication is usually taken before bedtime. Do not take it more often than directed. Continue to take your medication even if you feel better. Do not stop taking except on your care team's advice. If you are taking the 23 mg donepezil tablet, swallow it whole; do not cut, crush, or chew it. Talk to your care team about the use of this medication in children. Special care may be needed. Overdosage: If you think you have taken too much of this medicine contact a poison control center or emergency room at once. NOTE: This medicine is only for you. Do not share this medicine with others. What if I miss a dose? If you miss a dose, take it as soon as you can. If it is almost time for your next dose, take only that dose, do not take double or extra doses. What may interact with this medication? Do not take this medication with any of the following: Certain medications for fungal infections like itraconazole, fluconazole, posaconazole, and voriconazole Cisapride Dextromethorphan; quinidine Dronedarone Pimozide Quinidine Thioridazine This medication may also interact with the following: Antihistamines for allergy, cough and cold Atropine Bethanechol Carbamazepine  Certain medications for bladder problems like oxybutynin, tolterodine Certain medications for Parkinson's disease like benztropine, trihexyphenidyl Certain medications for stomach problems like dicyclomine, hyoscyamine Certain medications for travel sickness like scopolamine Dexamethasone Dofetilide Ipratropium NSAIDs, medications for pain and inflammation, like ibuprofen or naproxen Other medications for Alzheimer's disease Other medications that prolong the QT interval (cause an abnormal heart rhythm) Phenobarbital Phenytoin Rifampin, rifabutin or rifapentine Ziprasidone This  list may not describe all possible interactions. Give your health care provider a list of all the medicines, herbs, non-prescription drugs, or dietary supplements you use. Also tell them if you smoke, drink alcohol, or use illegal drugs. Some items may interact with your medicine. What should I watch for while using this medication? Visit your care team for regular checks on your progress. Check with your care team if your symptoms do not get better or if they get worse. You may get drowsy or dizzy. Do not drive, use machinery, or do anything that needs mental alertness until you know how this medication affects you. What side effects may I notice from receiving this medication? Side effects that you should report to your care team as soon as possible: Allergic reactions--skin rash, itching, hives, swelling of the face, lips, tongue, or throat Peptic ulcer--burning stomach pain, loss of appetite, bloating, burping, heartburn, nausea, vomiting Seizures Slow heartbeat--dizziness, feeling faint or lightheaded, confusion, trouble breathing, unusual weakness or fatigue Stomach bleeding--bloody or black, tar-like stools, vomiting blood or brown material that looks like coffee grounds Trouble passing urine Side effects that usually do not require medical attention (report these to your care team if they continue or are bothersome): Diarrhea Fatigue Loss of appetite Muscle pain or cramps Nausea Trouble sleeping This list may not describe all possible side effects. Call your doctor for medical advice about side effects. You may report side effects to FDA at 1-800-FDA-1088. Where should I keep my medication? Keep out of reach of children. Store at room temperature between 15 and 30 degrees C (59 and 86 degrees F). Throw away any unused medication after the expiration date. NOTE: This sheet is a summary. It may not cover all possible information. If you have questions about this medicine, talk to your  doctor, pharmacist, or health care provider.  2023 Elsevier/Gold Standard (2020-12-24 00:00:00)

## 2022-08-10 NOTE — Progress Notes (Unsigned)
[facility]    Provider:  Dr Jaynee Eagles Requesting Provider: Unk Pinto, MD Primary Care Provider:  Unk Pinto, MD  CC:  memory conserns  HPI:  [name] is a 72 y.o. male here as requested by Wayne Pinto, MD for memory problems. Here with his wife who also provides mch information. He has short-term memory problems. More communicating he having difficulty finding words. More short-term, can remember 25 years ago. Still driving, no accidents in the hone, still takes own medications, not missing any bills, wife is here and she reports much information and she says some people say he hasn;t been the same. Grandfather had dementia, dad passed away at 66, mom is 58 and no memory problems, one brother 64 and no dementia. Snores significantly, nods off, morning headaches, stress. No other focal neurologic deficits, associated symptoms, inciting events or modifiable factors.   Reviewed notes, labs and imaging from outside physicians, which showed:  Hgba1c 6.6  Tsh nml   Review of Systems: Patient complains of symptoms per HPI as well as the following symptoms neuropathy in feet. Pertinent negatives and positives per HPI. All others negative.   Social History   Socioeconomic History   Marital status: Married    Spouse name: Not on file   Number of children: Not on file   Years of education: Not on file   Highest education level: Not on file  Occupational History   Not on file  Tobacco Use   Smoking status: Never   Smokeless tobacco: Never  Vaping Use   Vaping Use: Never used  Substance and Sexual Activity   Alcohol use: Not Currently   Drug use: No   Sexual activity: Not on file  Other Topics Concern   Not on file  Social History Narrative   Not on file   Social Determinants of Health   Financial Resource Strain: Not on file  Food Insecurity: Not on file  Transportation Needs: Not on file  Physical Activity: Not on file  Stress: Not on  file  Social Connections: Not on file  Intimate Partner Violence: Not on file    Family History  Problem Relation Age of Onset   Heart disease Mother 7       stent   Heart disease Father    Kidney disease Father    Heart failure Father    Alzheimer's disease Paternal Grandfather    Colon cancer Neg Hx    Esophageal cancer Neg Hx    Liver cancer Neg Hx    Pancreatic cancer Neg Hx    Rectal cancer Neg Hx    Stomach cancer Neg Hx     Past Medical History:  Diagnosis Date   Allergy    Bone spur    Left Shoulder   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 ([place]) 07/18/2017   Fracture    right forearm   Gout    Hyperlipidemia    Hypertension    Other testicular hypofunction    PONV (postoperative nausea and vomiting)    Type II or unspecified type diabetes mellitus without mention of complication, not stated as uncontrolled     Patient Active Problem List   Diagnosis Date Noted   Senile purpura ([place]) 01/08/2020   Statin myopathy 12/19/2019   Closed fracture of middle of right radius and ulna 07/16/2019   Diabetes, polyneuropathy ([place]) 12/17/2018   Statin intolerance 12/17/2018   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 ([place]) 07/18/2017  Vitamin D deficiency 08/29/2013   Medication management 08/29/2013   Hyperlipidemia associated with type 2 diabetes mellitus ([place])    Hypertension    Type 2 diabetes mellitus with stage 3 chronic kidney disease (HCC)    Testosterone deficiency    Gout     Past Surgical History:  Procedure Laterality Date   CYST REMOVAL HAND     ORIF RADIAL FRACTURE Right 07/18/2019   Procedure: OPEN REDUCTION INTERNAL FIXATION (ORIF) RADIUS AND ULNA FRACTURE;  Surgeon: Shona Needles, MD;  Location: [facility];  Service: Orthopedics;  Laterality: Right;   PROSTATE BIOPSY  2010   WRIST SURGERY Right 1998    Current Outpatient Medications  Medication Sig Dispense Refill   ALPHA LIPOIC ACID PO Take by mouth.     aspirin EC 81 MG tablet Take 81  mg by mouth daily. Swallow whole.     Cholecalciferol (VITAMIN D3 PO) Take 5,000 Units by mouth daily.     Cyanocobalamin (VITAMIN B-12 PO) Take 1,000 mg by mouth daily.     donepezil (ARICEPT) 5 MG tablet Take 1 tablet (5 mg total) by mouth at bedtime. 30 tablet 6   enalapril (VASOTEC) 20 MG tablet Take 20 mg by mouth daily.     ezetimibe (ZETIA) 10 MG tablet Take  1 tablet  Daily  for Cholesterol 90 tablet 3   Magnesium 250 MG TABS Take by mouth.     No current facility-administered medications for this visit.    Allergies as of 08/10/2022 - Review Complete 08/10/2022  Allergen Reaction Noted   Crestor [rosuvastatin] Diarrhea 08/04/2013   Welchol [colesevelam hcl] Diarrhea 08/04/2013   Zoloft [sertraline hcl] Other (See Comments) 08/04/2013   Penicillins Other (See Comments) 08/04/2013    Vitals: BP (!) 157/71   Pulse 70   Ht 5' 10"$  (1.778 m)   Wt 149 lb (67.6 kg)   BMI 21.38 kg/m  Last Weight:  Wt Readings from Last 1 Encounters:  08/10/22 149 lb (67.6 kg)   Last Height:   Ht Readings from Last 1 Encounters:  08/10/22 5' 10"$  (1.778 m)     Physical exam: Exam: Gen: NAD, conversant, well nourised, obese, well groomed                     CV: RRR, no MRG. No Carotid Bruits. No peripheral edema, warm, nontender Eyes: Conjunctivae clear without exudates or hemorrhage  Neuro: Detailed Neurologic Exam  Speech:    Speech is normal; fluent and spontaneous with normal comprehension.  Cognition:     08/10/2022   10:48 AM  MMSE - Mini Mental State Exam  Orientation to time 4  Orientation to Place 4  Registration 3  Attention/ Calculation 1  Recall 1  Language- name 2 objects 2  Language- repeat 1  Language- follow 3 step command 3  Language- read & follow direction 1  Write a sentence 1  Copy design 0  Total score 21       The patient is oriented to person, place, and time;     recent and remote memory intact;     language fluent;     normal attention,  concentration,     fund of knowledge Cranial Nerves:    The pupils are equal, round, and reactive to light. Attempted fundoscopy pupils too small to visualize. Visual fields are full to finger confrontation. Extraocular movements are intact. Trigeminal sensation is intact and the muscles of mastication are normal. The face is  symmetric. The palate elevates in the midline. Hearing intact. Voice is normal. Shoulder shrug is normal. The tongue has normal motion without fasciculations.   Coordination:    bradykinetic  Gait: Decreased arm swing and slightly stooped   Motor Observation:    No asymmetry, no atrophy, postural and action tremor.  Tone:    Normal muscle tone.    Posture:    Posture is normal. normal erect    Strength:    Strength is V/V in the upper and lower limbs.      Sensation: normal to pinprick distally in the feet, vibration intact     Reflex Exam:  DTR's:    Deep tendon reflexes in the upper and lower extremities are symmetrical bilaterally.   Toes:    The toes are downgoing bilaterally.   Clonus:    Clonus is absent.    Assessment/Plan:  Lovely patient who is extremely worried about his memory, mmse 21/30, no FHx of dementia,PMHx DM with polyneuropathy, CKD, HLD, HTN.  Snores significantly, nods off, memory loss, frequent awakenings; Sleep study MRI brain w/wo contrast: for reversible causes of dementia Bloodwork: including genetic testing and blood markers for alzheimer If bloodwork is concenring for alzheimer's we will do further testing - Pet scan for amyloid in the brain, the major proteins that "clog" brain, discussed dementia in detail today and answered all questions, discussed medication available including new lecanemab. [place] neurocognitive testing  Orders Placed This Encounter  Procedures   MR BRAIN W WO CONTRAST   B12 and Folate Panel   Methylmalonic acid, serum   Vitamin B1   Homocysteine   TSH Rfx on Abnormal to Free T4   RPR   ATN  PROFILE   APOE Alzheimer's Risk   Ambulatory referral to Sleep Studies   Ambulatory referral to Neuropsychology   Meds ordered this encounter  Medications   donepezil (ARICEPT) 5 MG tablet    Sig: Take 1 tablet (5 mg total) by mouth at bedtime.    Dispense:  30 tablet    Refill:  6    Cc: Wayne Pinto, MD,  Wayne Pinto, MD  Wayne Ill, MD  [facility] [address] [place], [place] [address]  Phone [phone] Fax [phone]  I spent over 60 minutes of face-to-face and non-face-to-face time with patient on the  1. screen for Alzheimer disease ([place])   2. screen for B12 deficiency   3. screen Vitamin B1 deficiency   4. Other fatigue   5. Snores   6. Excessive daytime sleepiness   7. Memory loss   8. screen for Alzheimer's disease, unspecified (CODE) ([place])    diagnosis.  This included previsit chart review, lab review, study review, order entry, electronic health record documentation, patient education on the different diagnostic and therapeutic options, counseling and coordination of care, risks and benefits of management, compliance, or risk factor reduction

## 2022-08-11 ENCOUNTER — Telehealth: Payer: Self-pay | Admitting: Neurology

## 2022-08-11 ENCOUNTER — Encounter: Payer: Self-pay | Admitting: Neurology

## 2022-08-11 NOTE — Telephone Encounter (Signed)
[employer] NPR sent to [facility] [phone]

## 2022-08-12 ENCOUNTER — Other Ambulatory Visit: Payer: Self-pay | Admitting: Neurology

## 2022-08-12 DIAGNOSIS — G309 Alzheimer's disease, unspecified: Secondary | ICD-10-CM

## 2022-08-12 DIAGNOSIS — R413 Other amnesia: Secondary | ICD-10-CM

## 2022-08-13 LAB — ATN PROFILE
A -- Beta-amyloid 42/40 Ratio: 0.085 — ABNORMAL LOW (ref >0.102)
Beta-amyloid 40: 285.09 pg/mL
Beta-amyloid 42: 24.27 pg/mL
N -- NfL, Plasma: 7.4 pg/mL (ref 0.00–7.64)
T -- p-tau181: 4.28 pg/mL — ABNORMAL HIGH (ref 0.00–0.97)

## 2022-08-15 ENCOUNTER — Telehealth: Payer: Self-pay | Admitting: Neurology

## 2022-08-15 DIAGNOSIS — G309 Alzheimer's disease, unspecified: Secondary | ICD-10-CM

## 2022-08-15 DIAGNOSIS — R413 Other amnesia: Secondary | ICD-10-CM

## 2022-08-15 NOTE — Telephone Encounter (Signed)
I would say to send referral to Dr Alphonzo Severance with [facility] instead. Dr Jaynee Eagles, are you ok with that?

## 2022-08-15 NOTE — Telephone Encounter (Signed)
Yes send to [employer] thanks

## 2022-08-15 NOTE — Telephone Encounter (Signed)
[facility] out of network in [employer] plans. Do you have another neuropsychology you would to send referral? Could you enter another referral?

## 2022-08-15 NOTE — Progress Notes (Signed)
Date : 08/15/22         Patient Name: [name], [name]            DOB: 2051/05/19   Review office visits, consults, Medication changes and Chronic conditions for Focus Pharmacist Outreach with CPP on 08/18/22   Confirm visit date: 08/18/22 between 3-5 PM Have you seen any providers since your last visit or any hospitalizations? 03/24/22 (PCP) no med changes. 06/22/22 (PCP) no med changes. Have there been any changes to your medications since your last visit? Unable to obtain Have you had any problems with getting your medications from your pharmacy? (Copay barriers) None What is your top health concern you'd like to discuss for your upcoming visit? Patient has appts on Wed 08/17/22, may have questions afterwards.  HC: Last Care Plan date:  01/2022  Total time spent: 8 min

## 2022-08-16 NOTE — Telephone Encounter (Signed)
I placed another referral specifying Dr Alphonzo Severance.

## 2022-08-16 NOTE — Addendum Note (Signed)
Addended by: Gildardo Griffes on: 08/16/2022 07:52 AM   Modules accepted: Orders

## 2022-08-17 ENCOUNTER — Ambulatory Visit
Admission: RE | Admit: 2022-08-17 | Discharge: 2022-08-17 | Disposition: A | Discharge status: Home / Self Care | Payer: PPO | Source: Ambulatory Visit | Attending: Neurology | Admitting: Neurology

## 2022-08-17 ENCOUNTER — Telehealth: Payer: Self-pay | Admitting: Neurology

## 2022-08-17 ENCOUNTER — Encounter: Payer: Self-pay | Admitting: *Deleted

## 2022-08-17 ENCOUNTER — Telehealth: Payer: Self-pay | Admitting: *Deleted

## 2022-08-17 DIAGNOSIS — Z01818 Encounter for other preprocedural examination: Secondary | ICD-10-CM | POA: Diagnosis not present

## 2022-08-17 DIAGNOSIS — L570 Actinic keratosis: Secondary | ICD-10-CM | POA: Diagnosis not present

## 2022-08-17 DIAGNOSIS — L820 Inflamed seborrheic keratosis: Secondary | ICD-10-CM | POA: Diagnosis not present

## 2022-08-17 DIAGNOSIS — G309 Alzheimer's disease, unspecified: Secondary | ICD-10-CM

## 2022-08-17 DIAGNOSIS — L821 Other seborrheic keratosis: Secondary | ICD-10-CM | POA: Diagnosis not present

## 2022-08-17 DIAGNOSIS — R413 Other amnesia: Secondary | ICD-10-CM | POA: Diagnosis not present

## 2022-08-17 DIAGNOSIS — X32XXXA Exposure to sunlight, initial encounter: Secondary | ICD-10-CM | POA: Diagnosis not present

## 2022-08-17 DIAGNOSIS — Z006 Encounter for examination for normal comparison and control in clinical research program: Secondary | ICD-10-CM

## 2022-08-17 MED ORDER — GADOPICLENOL 0.5 MMOL/ML IV SOLN
7.5000 mL | Freq: Once | INTRAVENOUS | Status: AC | PRN
  Administered 2022-08-17: 7.5 mL via INTRAVENOUS

## 2022-08-17 NOTE — Telephone Encounter (Signed)
-----   Message from Melvenia Beam, MD sent at 08/17/2022  2:19 PM EST ----- No evidence of metallic foreign body within the orbits.

## 2022-08-17 NOTE — Telephone Encounter (Signed)
Spoke to patient gave xray results . Pt asked about MRI test informed patient still waiting on results Pt expressed understanding and thanked me for calling

## 2022-08-17 NOTE — Research (Signed)
Spoke with [name] about V1P research. Scheduled him for March 14 at 0800. He states he will check with his wife and call me back.

## 2022-08-17 NOTE — Telephone Encounter (Signed)
Referral for psychology fax to [facility] to see Dr. Alphonzo Severance. Phone: [phone], Fax: [phone].

## 2022-08-18 NOTE — Telephone Encounter (Signed)
Focused Pharmacist Outreach     1. Surgery yesterday on skin- dermatology for mole removal. Some samples sent off for evaluation.   2. MRI- had scan yesterday, awaiting results. Has follow up appointment with neurology on 14th for results. Experiencing some short term memory loss and being evaluated.   3. Medication updates: Donepezil '5mg'$  - 1 tablet at bedtime. Alpha Lipoic Acid- 1 capsule daily. Stopped GPN per Dr. Melford Aase.    CPP Telemedicine Time: 10 min

## 2022-08-20 LAB — VITAMIN B1: Thiamine: 97.8 nmol/L (ref 66.5–200.0)

## 2022-08-20 LAB — APOE ALZHEIMER'S RISK

## 2022-08-20 LAB — B12 AND FOLATE PANEL
Folate: 10.6 ng/mL (ref >3.0)
Vitamin B-12: 1596 pg/mL — ABNORMAL HIGH (ref 232–1245)

## 2022-08-20 LAB — HOMOCYSTEINE: Homocysteine: 15.1 umol/L (ref 0.0–19.2)

## 2022-08-20 LAB — METHYLMALONIC ACID, SERUM: Methylmalonic Acid: 290 nmol/L (ref 0–378)

## 2022-08-20 LAB — TSH RFX ON ABNORMAL TO FREE T4: TSH: 1.51 u[IU]/mL (ref 0.450–4.500)

## 2022-08-20 LAB — RPR: RPR Ser Ql: NONREACTIVE

## 2022-08-23 ENCOUNTER — Telehealth: Payer: Self-pay | Admitting: *Deleted

## 2022-08-23 NOTE — Telephone Encounter (Signed)
-----   Message from Melvenia Beam, MD sent at 08/22/2022  7:14 PM EST ----- The labs are looking fine. He does no thave an alzheimer's gene. 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 I recommend he keep the formal memory testing which will will be very important, thanks dr Jaynee Eagles

## 2022-08-23 NOTE — Telephone Encounter (Signed)
I called wife of pt.  Gave her the results.  She verbalized understanding.  I gave her the upcoming appt date and time with Dr. Brett Fairy on 09-06-2022 at 1500.  We confirmed # to Dr. Alphonzo Severance for neuropsych eval. She will call and will encourage pt to keep this appt.  She appreciated call back and will call us back if needed.

## 2022-08-23 NOTE — Telephone Encounter (Signed)
I called and spoke to husband.  I gave him the results of the labs as per listed below.  I gave him the # and address of Dr. Alphonzo Severance to return call about setting up the appt for neuropsych eval.  It is in [place] not in [place].  [facility] [id number] [place], [place] [address] [phone].  He is concerned that his wife would be needing to be off work.   He will relay this information to wife and if she has question will call back.  I tried her after and could not LMVM as full.

## 2022-08-23 NOTE — Telephone Encounter (Signed)
Pt wife called stated she wants nurse to go over results with her. She is requesting a call back from nurse

## 2022-09-01 ENCOUNTER — Telehealth: Payer: Self-pay | Admitting: Neurology

## 2022-09-01 NOTE — Telephone Encounter (Addendum)
Spoke to patient and wife on speaker phone Pt and wife states there was no appointment today for patient at Dr. Nicole Kindred [facility] Pt and wife was very upset stating they drove a long way for appointment . Wife states  a Therapist, sports from our office told her the appointment was today . No note indicates that statement  Pt was not sure if he wanted to continue with neuropsych testing  Informed patient Dr Jaynee Eagles wants him to continue testing so he can get a complete diagnosis and able to treat properly Wife states has to give  two week notice to be off at work Explained to wife when Dr Nicole Kindred office calls to make appointment they will  work appointment around her work schedule Wife states fine Informed wife will call Dr Nicole Kindred' office to see what happened with scheduling conflict today.Called Dr. Les Pou office  spoke with Danae Chen .   Danae Chen states wife and patient were very upset in office today  Danae Chen states went to lobby to schedule appointment and  speak with patient and wife they already left  Danae Chen will call patient and wife today to get the patient scheduled Informed Danae Chen wife needs up to two weeks to ask off at her job. Danae Chen states ok

## 2022-09-01 NOTE — Telephone Encounter (Signed)
Pt called and stated he had an appointment with Dr Alphonzo Severance for today. Stated when he arrived they wasn't aware of an appointment been made for pt at this office for today. Pt is requesting a call back to discuss

## 2022-09-02 ENCOUNTER — Telehealth: Payer: Self-pay

## 2022-09-02 NOTE — Progress Notes (Signed)
Patient: [name] DOB: Nov 02, 2050   HC calling pt to schedule FPO with CPP. OK per CPP request to schedule pt for FPO in June. HC called pt and scheduled him on 11/22/22 for FPO with CPP.  Total time spent: 64min

## 2022-09-06 ENCOUNTER — Ambulatory Visit: Payer: PPO | Admitting: Neurology

## 2022-09-06 ENCOUNTER — Encounter: Payer: Self-pay | Admitting: Neurology

## 2022-09-06 VITALS — BP 143/62 | HR 63 | Ht 69.0 in | Wt 147.2 lb

## 2022-09-06 DIAGNOSIS — G2581 Restless legs syndrome: Secondary | ICD-10-CM | POA: Diagnosis not present

## 2022-09-06 DIAGNOSIS — R29818 Other symptoms and signs involving the nervous system: Secondary | ICD-10-CM | POA: Diagnosis not present

## 2022-09-06 DIAGNOSIS — D692 Other nonthrombocytopenic purpura: Secondary | ICD-10-CM

## 2022-09-06 DIAGNOSIS — G3184 Mild cognitive impairment, so stated: Secondary | ICD-10-CM | POA: Diagnosis not present

## 2022-09-06 DIAGNOSIS — R0683 Snoring: Secondary | ICD-10-CM | POA: Diagnosis not present

## 2022-09-06 NOTE — Progress Notes (Signed)
SLEEP MEDICINE CLINIC    Provider:  Larey Seat, MD  Primary Care Physician:  Unk Pinto, MD [address] [place] [address]     Referring Provider: Melvenia Beam, [place],  [place] [address]          Chief Complaint according to patient   Patient presents with:     New Patient (Initial Visit)           HISTORY OF PRESENT ILLNESS:  [name] is a 72 y.o. male patient who is seen upon referral on 09/06/2022 from Dr Jaynee Eagles for a sleep evaluation in the setting of memory loss.   09-06-2022:  Chief concern according to patient :  " I am snoring , I go to bed early at 9 Pm and do wake up at midnight to urinate, and then I will struggle to resume sleeping. " He denies acting out dreams, sleep choking, coughing and he has sometimes neuropathy pain in his feet and  leg cramping. He feels rested in AM. Every morning he rises at 6 AM. He is feeling well on 7 hours of sleep, sometimes more.    I have the pleasure of seeing Carrell Gentilcore 09/06/22 a right-handed male with a possible sleep disorder.     Sleep relevant medical history: Nocturia 1-2 times , wisdom teeth extraction, no braces.     Family medical /sleep history: No  other family member on CPAP with OSA    Social history:  Patient is retired from a career as a Statistician , took early retirement and went to [employer] ,  still works 2 days a week. He tinkers with boats and cars. He lives in a household with spouse , and cat-  one adult daughter is on her own.   The patient currently works 2 days a week.  Tobacco use;never.  ETOH use ; one beer a month or less.  Caffeine intake in form of Coffee( decaff 1 cup in AM ) Soda( /) Tea ( rarely) or energy drinks.     Sleep habits are as follows:  The patient's dinner time is between 6 PM. The patient goes to bed at 8.30-9 PM and continues to sleep for 7 hours, wakes for 2 bathroom breaks   The  preferred sleep position is laterally, with the support of 3 pillows.  Dreams are reportedly frequent/vivid.  The patient wakes up before his alarm. 6.30  AM is the usual rise time. He reports not feeling fully refreshed or restored in AM, with symptoms such as dry mouth, no morning headaches, and some  residual fatigue. Naps are not taken, only when he is bored, lasting from 30 to 60 minutes and are refreshing .    Dr Cathren Laine note - 08-17-2022:  Kyrillos Kalafut is a 72 y.o. male here as requested by Unk Pinto, MD for memory problems. Here with his wife who also provides mch information. He has short-term memory problems. More communicating he having difficulty finding words. More short-term, can remember 25 years ago. Still driving, no accidents in the hone, still takes own medications, not missing any bills, wife is here and she reports much information and she says some people say he hasn;t been the same. Grandfather had dementia, dad passed away at 90, mom is 34 and no memory problems, one brother 1 and no dementia. Snores significantly, nods off, morning headaches, stress. No other focal neurologic deficits,  associated symptoms, inciting events or modifiable factors.     Reviewed notes, labs and imaging from outside physicians, which showed:   Hgba1c 6.6  Tsh nml     Review of Systems: Out of a complete 14 system review, the patient complains of only the following symptoms, and all other reviewed systems are negative.:  Fatigue, sleepiness , snoring, fragmented sleep, Insomnia,  RLS- cramping , Nocturia 2 times    How likely are you to doze in the following situations: 0 = not likely, 1 = slight chance, 2 = moderate chance, 3 = high chance   Sitting and Reading? Watching Television? Sitting inactive in a public place (theater or meeting)? As a passenger in a car for an hour without a break? Lying down in the afternoon when circumstances permit? Sitting and talking to  someone? Sitting quietly after lunch without alcohol? In a car, while stopped for a few minutes in traffic?   Total = 6/ 24 points   FSS endorsed at 26/ 63 points.   GDS 5-6 / 15 points.   Social History   Socioeconomic History   Marital status: Married    Spouse name: Not on file   Number of children: Not on file   Years of education: Not on file   Highest education level: Not on file  Occupational History   Not on file  Tobacco Use   Smoking status: Never   Smokeless tobacco: Never  Vaping Use   Vaping Use: Never used  Substance and Sexual Activity   Alcohol use: Not Currently   Drug use: No   Sexual activity: Not on file  Other Topics Concern   Not on file  Social History Narrative   Not on file   Social Determinants of Health   Financial Resource Strain: Not on file  Food Insecurity: Not on file  Transportation Needs: Not on file  Physical Activity: Not on file  Stress: Not on file  Social Connections: Not on file    Family History  Problem Relation Age of Onset   Heart disease Mother 46       stent   Heart disease Father    Kidney disease Father    Heart failure Father    Alzheimer's disease Paternal Grandfather    Colon cancer Neg Hx    Esophageal cancer Neg Hx    Liver cancer Neg Hx    Pancreatic cancer Neg Hx    Rectal cancer Neg Hx    Stomach cancer Neg Hx     Past Medical History:  Diagnosis Date   Allergy    Bone spur    Left Shoulder   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 ([place]) 07/18/2017   Fracture    right forearm   Gout    Hyperlipidemia    Hypertension    Other testicular hypofunction    PONV (postoperative nausea and vomiting)    Type II or unspecified type diabetes mellitus without mention of complication, not stated as uncontrolled     Past Surgical History:  Procedure Laterality Date   CYST REMOVAL HAND     ORIF RADIAL FRACTURE Right 07/18/2019   Procedure: OPEN REDUCTION INTERNAL FIXATION (ORIF) RADIUS AND ULNA  FRACTURE;  Surgeon: Shona Needles, MD;  Location: [facility];  Service: Orthopedics;  Laterality: Right;   PROSTATE BIOPSY  2010   WRIST SURGERY Right 1998     Current Outpatient Medications on File Prior to Visit  Medication Sig Dispense  Refill   ALPHA LIPOIC ACID PO Take by mouth.     aspirin EC 81 MG tablet Take 81 mg by mouth daily. Swallow whole.     Cholecalciferol (VITAMIN D3 PO) Take 5,000 Units by mouth daily.     Cyanocobalamin (VITAMIN B-12 PO) Take 1,000 mg by mouth daily.     donepezil (ARICEPT) 5 MG tablet Take 1 tablet (5 mg total) by mouth at bedtime. 30 tablet 6   enalapril (VASOTEC) 20 MG tablet Take 20 mg by mouth daily.     ezetimibe (ZETIA) 10 MG tablet Take  1 tablet  Daily  for Cholesterol 90 tablet 3   Magnesium 250 MG TABS Take by mouth.     No current facility-administered medications on file prior to visit.    Allergies  Allergen Reactions   Crestor [Rosuvastatin] Diarrhea   Welchol [Colesevelam Hcl] Diarrhea   Zoloft [Sertraline Hcl] Other (See Comments)    unknown   Penicillins Other (See Comments)     DIAGNOSTIC DATA (LABS, IMAGING, TESTING) - I reviewed patient records, labs, notes, testing and imaging myself where available.  Lab Results  Component Value Date   WBC 8.8 06/22/2022   HGB 13.4 06/22/2022   HCT 39.4 06/22/2022   MCV 81.6 06/22/2022   PLT 269 06/22/2022      Component Value Date/Time   NA 141 06/22/2022 1537   K 4.4 06/22/2022 1537   CL 106 06/22/2022 1537   CO2 24 06/22/2022 1537   GLUCOSE 86 06/22/2022 1537   BUN 26 (H) 06/22/2022 1537   CREATININE 1.66 (H) 06/22/2022 1537   CALCIUM 9.1 06/22/2022 1537   PROT 6.7 06/22/2022 1537   ALBUMIN 4.5 12/13/2016 0948   AST 22 06/22/2022 1537   ALT 19 06/22/2022 1537   ALKPHOS 50 12/13/2016 0948   BILITOT 0.5 06/22/2022 1537   GFRNONAA 42 (L) 12/02/2020 1042   GFRAA 49 (L) 12/02/2020 1042   Lab Results  Component Value Date   CHOL 191 06/22/2022   HDL 43 06/22/2022    LDLCALC 122 (H) 06/22/2022   TRIG 147 06/22/2022   CHOLHDL 4.4 06/22/2022   Lab Results  Component Value Date   HGBA1C 6.6 (H) 06/22/2022   Lab Results  Component Value Date   VITAMINB12 1,596 (H) 08/10/2022   Lab Results  Component Value Date   TSH 1.510 08/10/2022    PHYSICAL EXAM:  Today's Vitals   09/06/22 1535  BP: (!) 143/62  Pulse: 63  Weight: 147 lb 3.2 oz (66.8 kg)  Height: 5\' 9"  (1.753 m)   Body mass index is 21.74 kg/m.   Wt Readings from Last 3 Encounters:  09/06/22 147 lb 3.2 oz (66.8 kg)  08/10/22 149 lb (67.6 kg)  06/22/22 153 lb 6.4 oz (69.6 kg)     Ht Readings from Last 3 Encounters:  09/06/22 5\' 9"  (1.753 m)  08/10/22 5\' 10"  (1.778 m)  06/22/22 5' 8.5" (1.74 m)     Lost weight on metformin , lost  30 pounds.  General: The patient is awake, alert and appears not in acute distress. The patient is well groomed. Head: Normocephalic, atraumatic. Neck is supple.  Mallampati 3,  neck circumference:15 inches . Nasal airflow  patent.  Retrognathia is mild . Dental status: biological  Cardiovascular:  Regular rate and cardiac rhythm by pulse,  without distended neck veins. Respiratory: Lungs are clear to auscultation.  Skin:  Without evidence of ankle edema, or rash. Trunk: The patient's posture is erect.  NEUROLOGIC EXAM: The patient is awake and alert, oriented to place and time.   Memory subjective described as impaired, but no testing is done today.     No data to display             08/10/2022   10:48 AM  MMSE - Mini Mental State Exam  Orientation to time 4  Orientation to Place 4  Registration 3  Attention/ Calculation 1  Recall 1  Language- name 2 objects 2  Language- repeat 1  Language- follow 3 step command 3  Language- read & follow direction 1  Write a sentence 1  Copy design 0  Total score 21    Attention span & concentration ability appears normal.  Speech is fluent,  without  dysarthria, dysphonia or aphasia.  Mood  and affect are appropriate.   Cranial nerves: no loss of smell or taste reported  Pupils are equal and briskly reactive to light. Funduscopic exam deferred. .  Extraocular movements in vertical and horizontal planes were intact and without nystagmus. No Diplopia. Visual fields by finger perimetry are intact. Hearing was intact to soft voice and finger rubbing.    Facial sensation intact to fine touch.  Facial motor strength is symmetric and tongue and uvula move midline.  Masked face!!! Neck ROM : rotation, tilt and flexion extension were normal for age and shoulder shrug was symmetrical.    Motor exam:  Symmetric bulk, tone and ROM.   Normal tone with rigor and bilateral biceps and wrist cog wheeling, symmetric grip strength .   Sensory:  Fine touch  and vibration were tested  and  normal.  Proprioception tested in the upper extremities was normal.   Coordination: Rapid alternating movements in the fingers/hands were of normal speed.  The Finger-to-nose maneuver was intact without evidence of ataxia, dysmetria but tremor.    Gait and station: Patient could rise unassisted from a seated position, he walked with with a shuffle .  Stance is of normal width/ base   Toe and heel walk were deferred.  Deep tendon reflexes: in the  upper and lower extremities are symmetric and intact.  Babinski response was deferred .    ASSESSMENT AND PLAN 72 y.o. year old male  here with:    1) Snoring: unclear objective, he snores but he came alone and I get no real concern about his sleep from his  story. Will evaluate for OSA due to snoring history.   2) He has parkinsonian features - and tremor, rigor. Denies enactment of dreams. He sleeps in another room than his wife.   3) some RLS .   I will order an attended sleep study to evaluate for PLMs and REM BD , as well as OSA.    I plan to follow up either personally or through our NP within 4-6 months.   I would like to thank Unk Pinto,  MD and Melvenia Beam, [place],  [place] [address] for allowing me to meet with and to take care of this pleasant patient.   CC: I will share my notes with Dr Jaynee Eagles  After spending a total time of  45  minutes face to face and additional time for physical and neurologic examination, review of laboratory studies,  personal review of imaging studies, reports and results of other testing and review of referral information / records as far as provided in visit,   Electronically signed by: Larey Seat, MD 09/06/2022 3:55  PM  [facility] and [employer] certified by [employer] of Sleep Medicine and Diplomate of the [employer] of Sleep Medicine. Board certified In Neurology through the [employer], Fellow of the [employer] of Neurology. Medical Director of [employer].

## 2022-09-06 NOTE — Patient Instructions (Signed)
Healthy Living: Sleep In this video, you will learn why sleep is an important part of a healthy lifestyle. To view the content, go to this web address: [email or web address]  This video will expire on: 06/01/2024. If you need access to this video following this date, please reach out to the healthcare provider who assigned it to you. This information is not intended to replace advice given to you by your health care provider. Make sure you discuss any questions you have with your health care provider. Elsevier Patient Education  2023 [employer] Inc.  

## 2022-09-20 ENCOUNTER — Telehealth: Payer: Self-pay | Admitting: Neurology

## 2022-09-20 NOTE — Telephone Encounter (Signed)
HTA pending faxed notes 

## 2022-09-27 NOTE — Progress Notes (Unsigned)
3 MONTH FOLLOW UP Assessment:   Essential hypertension Enalapril 20mg  daily- checks blood pressure and takes half if running less than 100/60 Monitor blood pressure at home; call if consistently over 130/80 Continue DASH diet.   Reminder to go to the ER if any CP, SOB, nausea, dizziness, severe HA, changes vision/speech, left arm numbness and tingling and jaw pain. - CBC  Type 2 diabetes mellitus with stage 3 chronic kidney disease, without long-term current use of insulin (HCC) Type 2 Diabetes Mellitus Education: Reviewed 'ABCs' of diabetes management (respective goals in parentheses):  A1C (<7), blood pressure (<130/80), and cholesterol (LDL <70) Eye Exam yearly and Dental Exam every 6 months Dietary recommendations PATIENT ONLY ON 2 METFORMIN A DAY WITH WEIGHT LOSS Physical Activity recommendations - CMP -     Hemoglobin A1c  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 (HCC) Increase fluids, avoid NSAIDS, monitor sugars, will monitor -     CMP WITH GFR  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  -     Lipid panel   Testosterone deficiency Has opted against supplementation Zinc 50 mg daily supplement, high-intensity interval exercises  Idiopathic gout, unspecified chronicity, unspecified site Continue allopurinol Diet discussed Had recent flare at beginning of December, has resolved  Vitamin D deficiency At goal at check on 12/02/21 Defer vitamin D check until next visit  Medication management -     CBC with Differential/Platelet -     CMP  Diabetic polyneuropathy associated with type 2 diabetes mellitus (HCC) Continue gabapentin Continue weight loss       Over 30 minutes of exam, counseling, chart review, and critical decision making was performed  Future Appointments   Date Time Provider Department Center  09/28/2022 11:00 AM Raynelle Dick, NP [facility] None  12/14/2022  2:00 PM Anson Fret, MD [facility] None  01/12/2023 10:00 AM Lucky Cowboy, MD [facility] None  03/09/2023  4:00 PM Raynelle Dick, NP [facility] None     Plan:   During the course of the visit the patient was educated and counseled about appropriate screening and preventive services including:   Pneumococcal vaccine  Influenza vaccine Prevnar 13 Td vaccine Screening electrocardiogram Colorectal cancer screening Diabetes screening Glaucoma screening Nutrition counseling    Subjective:  [name] is a 72 y.o. male who presents for Medicare Annual Wellness Visit and 3 month follow up for HTN, hyperlipidemia, T2DM with CKD 3, and vitamin D Def.   He has an aching sensation in his right lower back that is worse when lying flat x past 2 weeks.  Ibuprofen does help relieve the pain. Denies pain that radiates down his leg.   He was outside working out in the yard in hot weather and was bending over making a walkway.  He felt overly fatigued and bp was 90/49.   His mom has gone to an skilled nursing facility in [place] and this is very stressful for him.  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  He continues to do a lot of fishing.   BMI is There is no height or weight on file to calculate BMI., he has been working on diet- avoiding sweets and soda. Increased water, continues to exercise.  Wt Readings from Last 3 Encounters:  09/06/22 147 lb  3.2 oz (66.8 kg)  08/10/22 149 lb (67.6 kg)  06/22/22 153 lb 6.4 oz (69.6 kg)   His blood pressure has been controlled at home usually 110-130/60-70, today their BP is  . He is checking his blood pressure and running normal , taking enalapril 20 mg daily, will cut in half if it gets too low  BP Readings from Last 3 Encounters:  09/06/22 (!) 143/62  08/10/22 (!) 157/71  06/22/22  136/74    He does not workout - but walks extensively at home and fishes on his days off.  He denies chest pain, shortness of breath, dizziness.   He is not on cholesterol medication (intolerant of several statins)  His cholesterol is not at goal. The cholesterol last visit was:   Lab Results  Component Value Date   CHOL 191 06/22/2022   HDL 43 06/22/2022   LDLCALC 122 (H) 06/22/2022   TRIG 147 06/22/2022   CHOLHDL 4.4 06/22/2022   He has been working on diet and exercise for T2 diabetes With hyperlipdiemia- has statin myopathy With CKD With neuropathy on gabapentin HE DECREASED TO 1 IN THE AM AND 1 NIGHT and denies increased appetite, nausea, paresthesia of the feet, polydipsia, polyuria, visual disturbances, vomiting and weight loss Fasting blood sugars are running 90-120 Last A1C in the office was:  Lab Results  Component Value Date   HGBA1C 6.6 (H) 06/22/2022   Last GFR Lab Results  Component Value Date   EGFR 44 (L) 06/22/2022     Patient is on Vitamin D supplement but remained below goal at most recent check:    Lab Results  Component Value Date   VD25OH 71 06/22/2022     Patient is on allopurinol for gout and does not report a recent flare.  Lab Results  Component Value Date   LABURIC 7.6 06/22/2022     Medication Review:   Current Outpatient Medications (Cardiovascular):    enalapril (VASOTEC) 20 MG tablet, Take 20 mg by mouth daily.   ezetimibe (ZETIA) 10 MG tablet, Take  1 tablet  Daily  for Cholesterol   Current Outpatient Medications (Analgesics):    aspirin EC 81 MG tablet, Take 81 mg by mouth daily. Swallow whole.  Current Outpatient Medications (Hematological):    Cyanocobalamin (VITAMIN B-12 PO), Take 1,000 mg by mouth daily.  Current Outpatient Medications (Other):    ALPHA LIPOIC ACID PO, Take by mouth.   Cholecalciferol (VITAMIN D3 PO), Take 5,000 Units by mouth daily.   donepezil (ARICEPT) 5 MG tablet, Take 1 tablet (5 mg total) by mouth  at bedtime.   Magnesium 250 MG TABS, Take by mouth.  Allergies: Allergies  Allergen Reactions   Crestor [Rosuvastatin] Diarrhea   Welchol [Colesevelam Hcl] Diarrhea   Zoloft [Sertraline Hcl] Other (See Comments)    unknown   Penicillins Other (See Comments)    Current Problems (verified) has Hyperlipidemia associated with type 2 diabetes mellitus; Hypertension; Type 2 diabetes mellitus with stage 3 chronic kidney disease; Testosterone deficiency; Gout; Vitamin D deficiency; Medication management; 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; Diabetes, polyneuropathy; Statin intolerance; Closed fracture of middle of right radius and ulna; Statin myopathy; Senile purpura; Parkinsonian features; Snoring; RLS (restless legs syndrome); and MCI (mild cognitive impairment) on their problem list.  Screening Tests Immunization History  Administered Date(s) Administered   PPD Test 02/07/2014, 02/16/2015   Pneumococcal Conjugate-13 04/12/2017   Pneumococcal Polysaccharide-23 11/01/2010, 05/29/2018   Td 12/25/2012   Zoster, Live 12/25/2012  Health Maintenance  Topic Date Due   COVID-19 Vaccine (1) Never done   Zoster Vaccines- Shingrix (1 of 2) Never done   OPHTHALMOLOGY EXAM  07/21/2018   FOOT EXAM  12/02/2022   Diabetic kidney evaluation - Urine ACR  12/03/2022   HEMOGLOBIN A1C  12/21/2022   DTaP/Tdap/Td (2 - Tdap) 12/26/2022   INFLUENZA VACCINE  01/19/2023   Medicare Annual Wellness (AWV)  03/25/2023   Diabetic kidney evaluation - eGFR measurement  06/23/2023   COLONOSCOPY (Pts 45-34yrs Insurance coverage will need to be confirmed)  04/16/2027   Pneumonia Vaccine 20+ Years old  Completed   Hepatitis C Screening  Completed   HPV VACCINES  Aged Out    Preventative care: Last colonoscopy: 10/2017, scheduled 04/15/22 cologuard + sent for colonoscopy Due Now will schedule  Names of Other Physician/Practitioners you currently use: 1. [facility]  here for primary care 2. Dr. Elmer Picker, eye doctor, last visit 2019- Needs to schedule 3. Dr. Alvester Morin, dentist, last visit 2022  Patient Care Team: Lucky Cowboy, MD as PCP - General (Internal Medicine) Charlett Nose, [facility] (Inactive) as Pharmacist (Pharmacist)  Surgical: He  has a past surgical history that includes Wrist surgery (Right, 1998); Prostate biopsy (2010); Cyst removal hand; and ORIF radial fracture (Right, 07/18/2019). Family His family history includes Alzheimer's disease in his paternal grandfather; Heart disease in his father; Heart disease (age of onset: 58) in his mother; Heart failure in his father; Kidney disease in his father. Social history  He reports that he has never smoked. He has never used smokeless tobacco. He reports that he does not currently use alcohol. He reports that he does not use drugs.  MEDICARE WELLNESS OBJECTIVES: Physical activity:   Cardiac risk factors:   Depression/mood screen:      03/24/2022    4:41 PM  Depression screen PHQ 2/9  Decreased Interest 1  Down, Depressed, Hopeless 1  PHQ - 2 Score 2  Altered sleeping 0  Tired, decreased energy 1  Change in appetite 0  Feeling bad or failure about yourself  0  Trouble concentrating 0  Moving slowly or fidgety/restless 0  Suicidal thoughts 0  PHQ-9 Score 3  Difficult doing work/chores Somewhat difficult    ADLs:     03/24/2022    4:42 PM 12/01/2021   10:11 PM  In your present state of health, do you have any difficulty performing the following activities:  Hearing? 0 0  Vision? 0 0  Difficulty concentrating or making decisions? 0 0  Walking or climbing stairs? 0 0  Dressing or bathing? 0 0  Doing errands, shopping? 0 0     Cognitive Testing  Alert? Yes  Normal Appearance?Yes  Oriented to person? Yes  Place? Yes   Time? Yes  Recall of three objects?  Yes  Can perform simple calculations? Yes  Displays appropriate judgment?Yes  Can read the correct time from a watch  face?Yes  EOL planning:    Review of Systems  Constitutional:  Negative for malaise/fatigue and weight loss.  HENT:  Negative for hearing loss and tinnitus.   Eyes:  Negative for blurred vision and double vision.  Respiratory:  Negative for cough, shortness of breath and wheezing.   Cardiovascular:  Negative for chest pain, palpitations, orthopnea, claudication and leg swelling.  Gastrointestinal:  Negative for abdominal pain, blood in stool, constipation, diarrhea, heartburn, melena, nausea and vomiting.  Genitourinary: Negative.   Musculoskeletal:  Positive for back pain (RLQ) and joint  pain (Right knee). Negative for myalgias.  Skin:  Negative for rash.       Scaly lesions of head and hands  Neurological:  Negative for dizziness, tingling, sensory change, weakness and headaches.  Endo/Heme/Allergies:  Negative for polydipsia.  Psychiatric/Behavioral: Negative.    All other systems reviewed and are negative.    Objective:   There were no vitals filed for this visit.  There is no height or weight on file to calculate BMI.  General appearance: alert, no distress, WD/WN, male HEENT: normocephalic, sclerae anicteric, TMs pearly, nares patent, no discharge or erythema, pharynx normal Oral cavity: MMM, no lesions Neck: supple, no lymphadenopathy, no thyromegaly, no masses Heart: RRR, normal S1, S2, no murmurs Lungs: CTA bilaterally, no wheezes, rhonchi, or rales Abdomen: +bs, soft, non tender, non distended, no masses, no hepatomegaly, no splenomegaly Skin: warm, dry. Seborrheic keratosis of right temple Musculoskeletal: nontender, no swelling, no obvious deformity Extremities: no edema, no cyanosis, no clubbing Pulses: 2+ symmetric, upper and lower extremities, normal cap refill Neurological: alert, oriented x 3, CN2-12 intact, strength normal upper extremities and lower extremities, sensation normal throughout, DTRs 2+ throughout, no cerebellar signs, gait normal Psychiatric:  normal affect, behavior normal, pleasant   Medicare Attestation I have personally reviewed: The patient's medical and social history Their use of alcohol, tobacco or illicit drugs Their current medications and supplements The patient's functional ability including ADLs,fall risks, home safety risks, cognitive, and hearing and visual impairment Diet and physical activities Evidence for depression or mood disorders  The patient's weight, height, BMI, and visual acuity have been recorded in the chart.  I have made referrals, counseling, and provided education to the patient based on review of the above and I have provided the patient with a written personalized care plan for preventive services.     Raynelle Dick, NP   09/27/2022

## 2022-09-28 ENCOUNTER — Ambulatory Visit (INDEPENDENT_AMBULATORY_CARE_PROVIDER_SITE_OTHER): Payer: PPO | Admitting: Nurse Practitioner

## 2022-09-28 ENCOUNTER — Encounter: Payer: Self-pay | Admitting: Nurse Practitioner

## 2022-09-28 VITALS — BP 162/72 | HR 64 | Temp 97.7°F | Ht 68.5 in | Wt 154.0 lb

## 2022-09-28 DIAGNOSIS — E785 Hyperlipidemia, unspecified: Secondary | ICD-10-CM

## 2022-09-28 DIAGNOSIS — N183 Chronic kidney disease, stage 3 unspecified: Secondary | ICD-10-CM | POA: Diagnosis not present

## 2022-09-28 DIAGNOSIS — Z79899 Other long term (current) drug therapy: Secondary | ICD-10-CM

## 2022-09-28 DIAGNOSIS — E1122 Type 2 diabetes mellitus with diabetic chronic kidney disease: Secondary | ICD-10-CM | POA: Diagnosis not present

## 2022-09-28 DIAGNOSIS — E349 Endocrine disorder, unspecified: Secondary | ICD-10-CM | POA: Diagnosis not present

## 2022-09-28 DIAGNOSIS — E1140 Type 2 diabetes mellitus with diabetic neuropathy, unspecified: Secondary | ICD-10-CM | POA: Diagnosis not present

## 2022-09-28 DIAGNOSIS — I10 Essential (primary) hypertension: Secondary | ICD-10-CM

## 2022-09-28 DIAGNOSIS — E1169 Type 2 diabetes mellitus with other specified complication: Secondary | ICD-10-CM | POA: Diagnosis not present

## 2022-09-28 DIAGNOSIS — E559 Vitamin D deficiency, unspecified: Secondary | ICD-10-CM | POA: Diagnosis not present

## 2022-09-28 DIAGNOSIS — D692 Other nonthrombocytopenic purpura: Secondary | ICD-10-CM | POA: Diagnosis not present

## 2022-09-28 DIAGNOSIS — M1000 Idiopathic gout, unspecified site: Secondary | ICD-10-CM

## 2022-09-28 DIAGNOSIS — N1832 Chronic kidney disease, stage 3b: Secondary | ICD-10-CM | POA: Diagnosis not present

## 2022-09-28 MED ORDER — ENALAPRIL MALEATE 10 MG PO TABS
10.0000 mg | ORAL_TABLET | Freq: Every day | ORAL | 3 refills | Status: DC
Start: 2022-09-28 — End: 2023-12-21

## 2022-09-28 NOTE — Patient Instructions (Signed)
  Restart enalapril 10 mg QD Check BP once a day and keep a log on paper and bring to next appointment  Memory Compensation Strategies  Use "WARM" strategy.  W= write it down  A= associate it  R= repeat it  M= make a mental note  2.   You can keep a Glass blower/designer.  Use a 3-ring notebook with sections for the following: calendar, important names and phone numbers,  medications, doctors' names/phone numbers, lists/reminders, and a section to journal what you did  each day.   3.    Use a calendar to write appointments down.  4.    Write yourself a schedule for the day.  This can be placed on the calendar or in a separate section of the Memory Notebook.  Keeping a  regular schedule can help memory.  5.    Use medication organizer with sections for each day or morning/evening pills.  You may need help loading it  6.    Keep a basket, or pegboard by the door.  Place items that you need to take out with you in the basket or on the pegboard.  You may also want to  include a message board for reminders.  7.    Use sticky notes.  Place sticky notes with reminders in a place where the task is performed.  For example: " turn off the  stove" placed by the stove, "lock the door" placed on the door at eye level, " take your medications" on  the bathroom mirror or by the place where you normally take your medications.  8.    Use alarms/timers.  Use while cooking to remind yourself to check on food or as a reminder to take your medicine, or as a  reminder to make a call, or as a reminder to perform another task, etc.

## 2022-09-29 LAB — CBC WITH DIFFERENTIAL/PLATELET
Absolute Monocytes: 519 cells/uL (ref 200–950)
Basophils Absolute: 61 cells/uL (ref 0–200)
Basophils Relative: 1 %
Eosinophils Absolute: 201 cells/uL (ref 15–500)
Eosinophils Relative: 3.3 %
HCT: 42.1 % (ref 38.5–50.0)
Hemoglobin: 13.8 g/dL (ref 13.2–17.1)
Lymphs Abs: 1312 cells/uL (ref 850–3900)
MCH: 28.2 pg (ref 27.0–33.0)
MCHC: 32.8 g/dL (ref 32.0–36.0)
MCV: 86.1 fL (ref 80.0–100.0)
MPV: 9.5 fL (ref 7.5–12.5)
Monocytes Relative: 8.5 %
Neutro Abs: 4008 cells/uL (ref 1500–7800)
Neutrophils Relative %: 65.7 %
Platelets: 239 10*3/uL (ref 140–400)
RBC: 4.89 10*6/uL (ref 4.20–5.80)
RDW: 13.1 % (ref 11.0–15.0)
Total Lymphocyte: 21.5 %
WBC: 6.1 10*3/uL (ref 3.8–10.8)

## 2022-09-29 LAB — COMPLETE METABOLIC PANEL WITH GFR
AG Ratio: 2 (calc) (ref 1.0–2.5)
ALT: 61 U/L — ABNORMAL HIGH (ref 9–46)
AST: 46 U/L — ABNORMAL HIGH (ref 10–35)
Albumin: 4.5 g/dL (ref 3.6–5.1)
Alkaline phosphatase (APISO): 54 U/L (ref 35–144)
BUN/Creatinine Ratio: 17 (calc) (ref 6–22)
BUN: 26 mg/dL — ABNORMAL HIGH (ref 7–25)
CO2: 26 mmol/L (ref 20–32)
Calcium: 9.5 mg/dL (ref 8.6–10.3)
Chloride: 108 mmol/L (ref 98–110)
Creat: 1.56 mg/dL — ABNORMAL HIGH (ref 0.70–1.28)
Globulin: 2.3 g/dL (calc) (ref 1.9–3.7)
Glucose, Bld: 84 mg/dL (ref 65–99)
Potassium: 5 mmol/L (ref 3.5–5.3)
Sodium: 142 mmol/L (ref 135–146)
Total Bilirubin: 0.6 mg/dL (ref 0.2–1.2)
Total Protein: 6.8 g/dL (ref 6.1–8.1)
eGFR: 47 mL/min/{1.73_m2} — ABNORMAL LOW (ref >=60)

## 2022-09-29 LAB — HEMOGLOBIN A1C
Hgb A1c MFr Bld: 6 % of total Hgb — ABNORMAL HIGH (ref <5.7)
Mean Plasma Glucose: 126 mg/dL
eAG (mmol/L): 7 mmol/L

## 2022-09-29 LAB — LIPID PANEL
Cholesterol: 151 mg/dL (ref <200)
HDL: 47 mg/dL (ref >=40)
LDL Cholesterol (Calc): 83 mg/dL (calc)
Non-HDL Cholesterol (Calc): 104 mg/dL (calc) (ref <130)
Total CHOL/HDL Ratio: 3.2 (calc) (ref <5.0)
Triglycerides: 109 mg/dL (ref <150)

## 2022-10-12 NOTE — Telephone Encounter (Signed)
10/11/22 VM full [place] 09/27/22 HTA auth: [id number] (Exp. 09/20/22 to 12/19/22)

## 2022-10-13 NOTE — Progress Notes (Deleted)
Assessment and Plan:  There are no diagnoses linked to this encounter.    Further disposition pending results of labs. Discussed med's effects and SE's.   Over 30 minutes of exam, counseling, chart review, and critical decision making was performed.   Future Appointments  Date Time Provider Department Center  10/14/2022 10:30 AM Raynelle Dick, NP [facility] None  12/14/2022  2:00 PM Anson Fret, MD [facility] None  01/12/2023 10:00 AM Lucky Cowboy, MD [facility] None  03/09/2023  4:00 PM Raynelle Dick, NP [facility] None    ------------------------------------------------------------------------------------------------------------------   HPI There were no vitals taken for this visit. 71 y.o.male presents for  Past Medical History:  Diagnosis Date   Allergy    Bone spur    Left Shoulder   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 07/18/2017   Fracture    right forearm   Gout    Hyperlipidemia    Hypertension    Other testicular hypofunction    PONV (postoperative nausea and vomiting)    Type II or unspecified type diabetes mellitus without mention of complication, not stated as uncontrolled      Allergies  Allergen Reactions   Crestor [Rosuvastatin] Diarrhea   Welchol [Colesevelam Hcl] Diarrhea   Zoloft [Sertraline Hcl] Other (See Comments)    unknown   Penicillins Other (See Comments)    Current Outpatient Medications on File Prior to Visit  Medication Sig   ALPHA LIPOIC ACID PO Take by mouth.   aspirin EC 81 MG tablet Take 81 mg by mouth daily. Swallow whole.   Cholecalciferol (VITAMIN D3 PO) Take 5,000 Units by mouth daily.   Cyanocobalamin (VITAMIN B-12 PO) Take 1,000 mg by mouth daily.   donepezil (ARICEPT) 5 MG tablet Take 1 tablet (5 mg total) by mouth at bedtime.   enalapril (VASOTEC) 10 MG tablet Take 1 tablet (10 mg total) by mouth daily.   ezetimibe (ZETIA) 10 MG tablet Take  1 tablet  Daily  for Cholesterol   Magnesium 250 MG TABS Take  by mouth.   No current facility-administered medications on file prior to visit.    ROS: all negative except above.   Physical Exam:  There were no vitals taken for this visit.  General Appearance: Well nourished, in no apparent distress. Eyes: PERRLA, EOMs, conjunctiva no swelling or erythema Sinuses: No Frontal/maxillary tenderness ENT/Mouth: Ext aud canals clear, TMs without erythema, bulging. No erythema, swelling, or exudate on post pharynx.  Tonsils not swollen or erythematous. Hearing normal.  Neck: Supple, thyroid normal.  Respiratory: Respiratory effort normal, BS equal bilaterally without rales, rhonchi, wheezing or stridor.  Cardio: RRR with no MRGs. Brisk peripheral pulses without edema.  Abdomen: Soft, + BS.  Non tender, no guarding, rebound, hernias, masses. Lymphatics: Non tender without lymphadenopathy.  Musculoskeletal: Full ROM, 5/5 strength, normal gait.  Skin: Warm, dry without rashes, lesions, ecchymosis.  Neuro: Cranial nerves intact. Normal muscle tone, no cerebellar symptoms. Sensation intact.  Psych: Awake and oriented X 3, normal affect, Insight and Judgment appropriate.     Raynelle Dick, NP 9:00 AM [facility]

## 2022-10-14 ENCOUNTER — Ambulatory Visit: Payer: PPO | Admitting: Nurse Practitioner

## 2022-10-18 NOTE — Telephone Encounter (Signed)
NPSG patient is scheduled at [facility] for 12/27/22 at 8 pm.  Mailed packet to the patient.   Micah Flesher: [id number] (exp. 09/20/22 to 12/19/22)  I will redo the auth when the appt gets closer it will expire before he has the SS.

## 2022-10-21 DIAGNOSIS — G3184 Mild cognitive impairment, so stated: Secondary | ICD-10-CM | POA: Diagnosis not present

## 2022-10-21 DIAGNOSIS — G20C Parkinsonism, unspecified: Secondary | ICD-10-CM | POA: Diagnosis not present

## 2022-10-27 NOTE — Telephone Encounter (Signed)
HTA pending an extension on auth

## 2022-11-04 DIAGNOSIS — G3184 Mild cognitive impairment, so stated: Secondary | ICD-10-CM | POA: Insufficient documentation

## 2022-11-15 NOTE — Telephone Encounter (Signed)
Updated HTA auth:  NPSG- HTA auth: [id number] (exp. 11/27/22 to 01/27/23)   Patient is scheduled at [facility] for 12/27/22 at 8 pm

## 2022-11-22 ENCOUNTER — Telehealth: Payer: Self-pay | Admitting: *Deleted

## 2022-11-22 NOTE — Telephone Encounter (Signed)
Focused Nurse Outreach  [name],[name]  71 years, Male  DOB: 1950-11-12  M:   __________________________________________________ Call Outreach Attempt #1 Date:: 11/22/2022 Time: AM Outcome:: Successful  Engagement Notes [name] on 11/18/2022 11:26 AM Focused nurse outreach prep started. Reviewing office visits, specialists visits, hospital visits, adherence, medications, and labs. ( )  04/10/24Aundria Stephenson (PCP)- decreased Enalapril Maleate from 20mg  daily to 10mg  daily. No recent ED visits or hospitalizations. Patient states he will be working but can talk so anytime is fine.  Patient Visit  Details  Date visit completed: 11/22/2022  Type of visit:: Phone  Visit Details::  1. Short term memory loss: Patient had a MRI scan done in Feb and has been seeing Neuropsychology. Pt says they are unsure of the cause as of yet but he has a sleep study scheduled in July. Currently on Donepezil and feels that he has seen an improvement in his ST memory since taking this. 2. HTN: Pt had been having issues with BPs dropping too low, 80-90s/40-50s which caused him to feel like he was going to pass out. Enalapril was decreased to 10 mg and he says his BPs have improved 120-140s/50-60s. He is still working 2 days a week and with these readings he feels much better. 3. CKD Stage 3b: Reports feeling ok. Working on staying hydrated. Denies any swelling in hands/feet/legs/abdomen.  Next planned Nurse Follow-up in: 3 months  CRN phone visit (20 min)

## 2022-12-07 ENCOUNTER — Encounter: Payer: PPO | Admitting: Internal Medicine

## 2022-12-07 ENCOUNTER — Other Ambulatory Visit: Payer: Self-pay | Admitting: Nurse Practitioner

## 2022-12-07 ENCOUNTER — Telehealth: Payer: Self-pay | Admitting: Nurse Practitioner

## 2022-12-07 DIAGNOSIS — R239 Unspecified skin changes: Secondary | ICD-10-CM

## 2022-12-07 MED ORDER — DESOXIMETASONE 0.25 % EX CREA
1.0000 | TOPICAL_CREAM | Freq: Every day | CUTANEOUS | 1 refills | Status: DC | PRN
Start: 2022-12-07 — End: 2024-04-10

## 2022-12-07 NOTE — Telephone Encounter (Signed)
Pt is requesting desoximetasone cream. Pls send to [facility] on file.

## 2022-12-14 ENCOUNTER — Telehealth: Payer: Self-pay | Admitting: Neurology

## 2022-12-14 ENCOUNTER — Encounter: Payer: Self-pay | Admitting: Neurology

## 2022-12-14 ENCOUNTER — Ambulatory Visit: Payer: PPO | Admitting: Neurology

## 2022-12-14 VITALS — BP 139/64 | HR 67 | Ht 69.0 in | Wt 146.0 lb

## 2022-12-14 DIAGNOSIS — N189 Chronic kidney disease, unspecified: Secondary | ICD-10-CM

## 2022-12-14 DIAGNOSIS — G20C Parkinsonism, unspecified: Secondary | ICD-10-CM

## 2022-12-14 DIAGNOSIS — G3184 Mild cognitive impairment, so stated: Secondary | ICD-10-CM | POA: Diagnosis not present

## 2022-12-14 MED ORDER — DONEPEZIL HCL 10 MG PO TABS: 10.0000 mg | ORAL_TABLET | Freq: Every day | ORAL | 6 refills | Status: DC

## 2022-12-14 NOTE — Patient Instructions (Signed)
check for Patkinson's disease with DAT scan first and then if this test is negative will order...  order CT Amyloid scan for alzheimers.   Diagnosed with mild cognitive impairment (read below on [facility]) unclear if parkinson's disease or precursor to Alzheimer's disease   Brain DaTscan How to prepare and what to expect What is a brain DaTscan? A brain DaTscan is a nuclear medicine scan. It uses radioactive material to diagnose some diseases of the brain, especially those that cause tremor (shakiness). DaTscan is a brand name for a drug called ioflupane I-123. A brain DaTscan is a form of radiology, because radiation is used to take pictures of the body. This radioactive drug is ordered especially for you. Because of this, we need at least 72 hours' notice if you must cancel or reschedule your scan.   How does the scan work? You will be given a small dose of tracer (radioactive material) through an intravenous (IV) line. This tracer will collect in part of your brain and give off gamma rays. A special camera called a gamma camera will use these rays to produce pictures and measurements of your brain. How do I prepare? Some drugs will affect the results of your brain DaTscan. You will need to stop taking these drugs before your scan. The table on page 2 lists the drugs that need to be stopped, and for how many days before your scan. This list is in alphabetical order by the generic name of the drug. The common brand names are listed beneath the generic name. Please confirm these instructions with your doctor who prescribed the drug. Drugs to Stop Taking Before your scan, stop taking these medicines for the length of time shown: Name of Drug Stop Taking  Amoxapine 4 days before  Benztropine  Cogentin 3 days before  Bupropion (Aplenzin, Budeprion, Voxra, Wellbutrin, Zyban) 48 hours before  Buspirone 15 hours before  Citalopram 24 hours before  Cocaine 6 hours before  Escitalopram 24 hours  before  Methamphetamine 24 hours before  Methylphenidate (Concerta, Metadate, Methylin, Ritalin) 20 hours before  Paroxetine 24 hours before  Selegilene 48 hours before  Sertraline 3 days before  If you are breastfeeding, or if there is any chance you are pregnant, please tell the scheduler or technologist (the person who will help you prepare for your scan). How is the scan done? When you first arrive, we will ask you to drink a small cup of water with potassium iodine in it. This water may have a metallic taste.  An hour after you drink the potassium iodine water, the technologist will inject a small amount of tracer into a vein in your arm or hand through your IV.  You must stay in the department for 30 minutes after the injection.  You will then have a break for 3 hours. It is OK to eat and drink during this break.  You must return to the clinic after this 3-hour break to have images of your brain taken.  Then, 4 hours after you receive your tracer injection, the technologist will take images of your brain with the gamma camera. You will lie flat on the exam table while these images are being taken.  You must not move while the camera is taking pictures. If you move, the pictures will be blurry and may have to be taken again.  Taking the images will take 40 to 45 minutes. Your total time in the imaging room will be about 1 hour.  You may  also have a low-dose CT scan of your brain to help confirm any results. A CT scan is another way to take images inside your body.  It will take about 5 hours from the time you drink the potassium iodine water until the scans are complete. What will I feel during the scan? The technologist will help make you as comfortable as possible on the exam table for the scan.  You may feel some minor discomfort from the IV.  Lying still on the exam table may be hard for some patients.  The camera will be close to your head. This may make you feel confined or uneasy  (claustrophobic). Please tell the doctor who referred you for this scan if you know you are claustrophobic. Are there any side effects from the scan? Most of the radioactivity from the tracer will pass out of your body in your urine or stool. The rest simply goes away over time.  Bad reactions to this scan are very rare. Fewer than 1% of patients (fewer than 1 out of 100) have a bad reaction. Reactions may include headache, nausea, vertigo (dizziness), or dry mouth. How do I get the results? When the test is over, the nuclear medicine doctor will review your images, prepare a written report, and talk with your doctor about the results. Your doctor will then talk with you about the results and your treatment options. If you needed to stop taking any medicines on the day of your scan, ask your doctor when to start taking them again.  The potentially interfering drugs consist of: amoxapine, amphetamine, benztropine, bupropion, buspirone, citalopram, cocaine, mazindol, methamphetamine, methylphenidate, norephedrine, phentermine, escitalopram,  phenylpropanolamine, selegiline, paroxetine, and sertraline   Mild Neurocognitive Disorder Mild neurocognitive disorder, formerly known as mild cognitive impairment, is a disorder in which memory does not work as well as it should. This disorder may also cause problems with other mental functions, including thought, communication, behavior, and completion of tasks. These problems can be noticed and measured, but they usually do not interfere with daily activities or the ability to live independently. Mild neurocognitive disorder typically develops after 72 years of age, but it can also develop at younger ages. It is not as serious as major neurocognitive disorder, also known as dementia, but it may be the first sign of it. Generally, symptoms of this condition get worse over time. In rare cases, symptoms can get better. What are the causes? This condition may be  caused by: Brain disorders like Alzheimer's disease, Parkinson's disease, and other conditions that gradually damage nerve cells (neurodegenerative conditions). Diseases that affect blood vessels in the brain and result in small strokes. Certain infections, such as HIV. Traumatic brain injury. Other medical conditions, such as brain tumors, underactive thyroid (hypothyroidism), and vitamin B12 deficiency. Use of certain drugs or prescription medicines. What increases the risk? The following factors may make you more likely to develop this condition: Being older than 65 years. Being male. Low education level. Diabetes, high blood pressure, high cholesterol, and other conditions that increase the risk for blood vessel diseases. Untreated or undertreated sleep apnea. Having a certain type of gene that can be passed from parent to child (inherited). Chronic health problems such as heart disease, lung disease, liver disease, kidney disease, or depression. What are the signs or symptoms? Symptoms of this condition include: Difficulty remembering. You may: Forget names, phone numbers, or details of recent events. Forget social events and appointments. Repeatedly forget where you put your car keys or  other items. Difficulty thinking and solving problems. You may have trouble with complex tasks, such as: Paying bills. Driving in unfamiliar places. Difficulty communicating. You may have trouble: Finding the right word or naming an object. Forming a sentence that makes sense, or understanding what you read or hear. Changes in your behavior or personality. When this happens, you may: Lose interest in the things that you used to enjoy. Withdraw from social situations. Get angry more easily than usual. Act before thinking. How is this diagnosed? This condition is diagnosed based on: Your symptoms. Your health care provider may ask you and the people you spend time with, such as family and  friends, about your symptoms. Evaluation of mental functions (neuropsychological testing). Your health care provider may refer you to a neurologist or mental health specialist to evaluate your mental functions in detail. To identify the cause of your condition, your health care provider may: Get a detailed medical history. Ask about use of alcohol, drugs, and prescription medicines. Do a physical exam. Order blood tests and brain imaging exams. How is this treated? Mild neurocognitive disorder that is caused by medicine use, drug use, infection, or another medical condition may improve when the cause is treated, or when medicines or drugs are stopped. If this disorder has another cause, it generally does not improve and may get worse. In these cases, the goal of treatment is to help you manage the loss of mental function. Treatments in these cases include: Medicine. Medicine mainly helps memory and behavior symptoms. Talk therapy. Talk therapy provides education, emotional support, memory aids, and other ways of making up for problems with mental function. Lifestyle changes, including: Getting regular exercise. Eating a healthy diet that includes omega-3 fatty acids. Challenging your thinking and memory skills. Having more social interaction. Follow these instructions at home: Eating and drinking  Drink enough fluid to keep your urine pale yellow. Eat a healthy diet that includes omega-3 fatty acids. These can be found in: Fish. Nuts. Leafy vegetables. Vegetable oils. If you drink alcohol: Limit how much you use to: 0-1 drink a day for women. 0-2 drinks a day for men. Be aware of how much alcohol is in your drink. In the [place] one drink equals one 12 oz bottle of beer (355 mL), one 5 oz glass of wine (148 mL), or one 1 oz glass of hard liquor (44 mL). Lifestyle  Get regular exercise as told by your health care provider. Do not use any products that contain nicotine or tobacco, such  as cigarettes, e-cigarettes, and chewing tobacco. If you need help quitting, ask your health care provider. Practice ways to manage stress. If you need help managing stress, ask your health care provider. Continue to have social interaction. Keep your mind active with stimulating activities you enjoy, such as reading or playing games. Make sure to get quality sleep. Follow these tips: Avoid napping during the day. Keep your sleeping area dark and cool. Avoid exercising during the few hours before you go to bed. Avoid caffeine products in the evening. General instructions Take over-the-counter and prescription medicines only as told by your health care provider. Your health care provider may recommend that you avoid taking medicines that can affect thinking, such as pain medicines or sleep medicines. Work with your health care provider to find out what you need help with and what your safety needs are. Keep all follow-up visits. This is important. Where to find more information [employer] on Aging: https://walker.com/ Contact a health  care provider if: You have any new symptoms. Get help right away if: You develop new confusion or your confusion gets worse. You act in ways that place you or your family in danger. Summary Mild neurocognitive disorder is a disorder in which memory does not work as well as it should. Mild neurocognitive disorder can have many causes. It may be the first stage of dementia. To manage your condition, get regular exercise, keep your mind active, get quality sleep, and eat a healthy diet. This information is not intended to replace advice given to you by your health care provider. Make sure you discuss any questions you have with your health care provider. Document Revised: 10/21/2019 Document Reviewed: 10/21/2019 Elsevier Patient Education  2024 [employer].

## 2022-12-14 NOTE — Telephone Encounter (Signed)
Please call wife. 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 We decided(patient here and wife on phne) to start with a scan to look for parkinson's disease(DAT Scan) and if negative go to a CT brain amyloid for alzheimer's. Also he has sleep study July 9th. In the meantime I increased his Aricept to 10mg .  We should be able to get this all completed within a few months. I'd like wife to be at te next appointment to discuss results and what we can do to treat him. Probably September sometime. Can you ask wife if she has a time in September maybe mid September to come to an appointment with patient and schedule it please? You can review the above.   I also sent him home with his AVS that describes the DAT scan and MCI if she wants to read it. We will call for the DAT Scan scheduling. Thanks

## 2022-12-14 NOTE — Progress Notes (Addendum)
[id number]    Provider:  Dr Lucia Gaskins Requesting Provider: Lucky Cowboy, MD Primary Care Provider:  Lucky Cowboy, MD  CC:  MCI  Update 03/01/2023: [name], Porfirio Mylar, MD  P Gna-Pod 3 Results Cc: Anson Fret, MD; Lucky Cowboy, MD The PSG revealed very fragmented sleep, surprisingly no snoring, apnea  and only few limb movements. Abnormal EMG tone during REM sleep and bradycardia are the only findings. EMG tone can be related to REM BD / seen in LBD and PD.  12/14/2022: Diagnosed with mild cognitive impairment. Discussed parkinson's disease vs alzheimer's disease. Discussed lecanemab. Dicussed DAT Scan or Amyloid Pet Scan.   No e4 gene:  E3/E3 genetic testing ATN showed: A low beta-amyloid 42/40 and a high pTau181 concentration were observed. A normal NfL concentration was observed at this time. These results are consistent with the presence of Alzheimer's  related pathology. Spoke to wife and patint, decided to check for PD with DAT scan first and then if negative CT Amyloid scan for alzheimers.   Hgba1c 6 Ldl 122 CMP BUN 26 creat 1.56, AST 46 ALT 61 Cbc nml RPR NR TSH nml Scheduled for sleep study July 9th.  MRI brain 08/17/2022: reviewed images: FINDINGS:    No abnormal lesions are seen on diffusion-weighted views to suggest acute ischemia. The cortical sulci, fissures and cisterns are normal in size and appearance. Lateral, third and fourth ventricle are normal in size and appearance. No extra-axial fluid collections are seen. No evidence of mass effect or midline shift.  Mild periventricular and subcortical foci of nonspecific T2 hyperintensities.  No abnormal lesions are seen on post contrast views.     On sagittal views the posterior fossa, pituitary gland and corpus callosum are unremarkable. No evidence of intracranial hemorrhage on SWI views. The orbits and their contents, paranasal sinuses and calvarium are unremarkable.  Intracranial flow voids  are present.     IMPRESSION:    MRI brain (with and without) demonstrating: - Mild periventricular and subcortical foci of nonspecific gliosis. - No acute findings.  Patient complains of symptoms per HPI as well as the following symptoms: none . Pertinent negatives and positives per HPI. All others negative   HPI:  [name] is a 72 y.o. male here as requested by Lucky Cowboy, MD for memory problems. Here with his wife who also provides mch information. He has short-term memory problems. More communicating he having difficulty finding words. More short-term, can remember 25 years ago. Still driving, no accidents in the hone, still takes own medications, not missing any bills, wife is here and she reports much information and she says some people say he hasn;t been the same. Grandfather had dementia, dad passed away at 88, mom is 63 and no memory problems, one brother 43 and no dementia. Snores significantly, nods off, morning headaches, stress. No other focal neurologic deficits, associated symptoms, inciting events or modifiable factors.   Reviewed notes, labs and imaging from outside physicians, which showed:  Hgba1c 6.6  Tsh nml   Review of Systems: Patient complains of symptoms per HPI as well as the following symptoms neuropathy in feet. Pertinent negatives and positives per HPI. All others negative.   Social History   Socioeconomic History   Marital status: Married    Spouse name: Not on file   Number of children: Not on file   Years of education: Not on file   Highest education level: Not on file  Occupational History   Not on file  Tobacco  Use   Smoking status: Never   Smokeless tobacco: Never  Vaping Use   Vaping Use: Never used  Substance and Sexual Activity   Alcohol use: Not Currently    Comment: maybe 2-3 beers in a year   Drug use: No   Sexual activity: Not on file  Other Topics Concern   Not on file  Social History Narrative   Lives at home with  wife   Right handed   Caffeine: 1/2-1 cup a few days a week   Social Determinants of Health   Financial Resource Strain: Not on file  Food Insecurity: Not on file  Transportation Needs: Not on file  Physical Activity: Not on file  Stress: Not on file  Social Connections: Not on file  Intimate Partner Violence: Not on file    Family History  Problem Relation Age of Onset   Heart disease Mother 8       stent   Heart disease Father    Kidney disease Father    Heart failure Father    Alzheimer's disease Paternal Grandfather    Colon cancer Neg Hx    Esophageal cancer Neg Hx    Liver cancer Neg Hx    Pancreatic cancer Neg Hx    Rectal cancer Neg Hx    Stomach cancer Neg Hx     Past Medical History:  Diagnosis Date   Allergy    Bone spur    Left Shoulder   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 (HCC) 07/18/2017   Fracture    right forearm   Gout    Hyperlipidemia    Hypertension    Other testicular hypofunction    PONV (postoperative nausea and vomiting)    Type II or unspecified type diabetes mellitus without mention of complication, not stated as uncontrolled     Patient Active Problem List   Diagnosis Date Noted   Parkinsonian features 09/06/2022   Snoring 09/06/2022   RLS (restless legs syndrome) 09/06/2022   MCI (mild cognitive impairment) 09/06/2022   Senile purpura (HCC) 01/08/2020   Statin myopathy 12/19/2019   Closed fracture of middle of right radius and ulna 07/16/2019   Diabetes, polyneuropathy (HCC) 12/17/2018   Statin intolerance 12/17/2018   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 (HCC) 07/18/2017   Vitamin D deficiency 08/29/2013   Medication management 08/29/2013   Hyperlipidemia associated with type 2 diabetes mellitus (HCC)    Hypertension    Type 2 diabetes mellitus with stage 3 chronic kidney disease (HCC)    Testosterone deficiency    Gout     Past Surgical History:  Procedure Laterality Date   CYST REMOVAL HAND     ORIF  RADIAL FRACTURE Right 07/18/2019   Procedure: OPEN REDUCTION INTERNAL FIXATION (ORIF) RADIUS AND ULNA FRACTURE;  Surgeon: Roby Lofts, MD;  Location: [facility] OR;  Service: Orthopedics;  Laterality: Right;   PROSTATE BIOPSY  2010   WRIST SURGERY Right 1998    Current Outpatient Medications  Medication Sig Dispense Refill   ALPHA LIPOIC ACID PO Take by mouth.     aspirin EC 81 MG tablet Take 81 mg by mouth daily. Swallow whole.     Cholecalciferol (VITAMIN D3 PO) Take 5,000 Units by mouth daily.     Cyanocobalamin (VITAMIN B-12 PO) Take by mouth daily.     desoximetasone (TOPICORT) 0.25 % cream Apply 1 Application topically daily as needed (Skin rash). 30 g 1   enalapril (VASOTEC)  10 MG tablet Take 1 tablet (10 mg total) by mouth daily. 90 tablet 3   ezetimibe (ZETIA) 10 MG tablet Take  1 tablet  Daily  for Cholesterol 90 tablet 3   Magnesium 250 MG TABS Take by mouth 2 (two) times daily.     donepezil (ARICEPT) 10 MG tablet Take 1 tablet (10 mg total) by mouth at bedtime. 30 tablet 6   No current facility-administered medications for this visit.    Allergies as of 12/14/2022 - Review Complete 12/14/2022  Allergen Reaction Noted   Crestor [rosuvastatin] Diarrhea 08/04/2013   Welchol [colesevelam hcl] Diarrhea 08/04/2013   Zoloft [sertraline hcl] Other (See Comments) 08/04/2013   Penicillins Other (See Comments) 08/04/2013    Vitals: BP 139/64 (BP Location: Right Arm, Patient Position: Sitting)   Pulse 67   Ht 5\' 9"  (1.753 m)   Wt 146 lb (66.2 kg)   BMI 21.56 kg/m  Last Weight:  Wt Readings from Last 1 Encounters:  12/14/22 146 lb (66.2 kg)   Last Height:   Ht Readings from Last 1 Encounters:  12/14/22 5\' 9"  (1.753 m)   Exam: NAD, pleasant                  Speech:    Speech is normal; fluent and spontaneous with normal comprehension.  Cognition:     08/10/2022   10:48 AM  MMSE - Mini Mental State Exam  Orientation to time 4  Orientation to Place 4  Registration 3   Attention/ Calculation 1  Recall 1  Language- name 2 objects 2  Language- repeat 1  Language- follow 3 step command 3  Language- read & follow direction 1  Write a sentence 1  Copy design 0  Total score 21       The patient is oriented to person, place, and time;     recent and remote memory intact;     language fluent;    Cranial Nerves:    The pupils are equal, round, and reactive to light.Trigeminal sensation is intact and the muscles of mastication are normal. The face is symmetric. The palate elevates in the midline. Hearing intact. Voice is normal. Shoulder shrug is normal. The tongue has normal motion without fasciculations.   Coordination:  No dysmetria  Motor Observation:    No asymmetry, no atrophy, and no involuntary movements noted. Tone:    Normal muscle tone.     Strength:    Strength is V/V in the upper and lower limbs.      Sensation: intact to LT     Assessment/Plan:  Lovely patient who is extremely worried about his memory, mmse 21/30, unknown FHx of dementia,PMHx DM with polyneuropathy, CKD, HLD, HTN.  Diagnosed with mild cognitive impairment by formal memory testing. Discussed parkinson's disease vs alzheimer's disease. Discussed lecanemab. Dicussed DAT Scan or Amyloid Pet Scan. Spoke to wife and patint, decided to check for PD with DAT scan first and then if negative CT Amyloid scan for alzheimers.   No e4 gene:  E3/E3 genetic testing ATN showed: A low beta-amyloid 42/40 and a high pTau181 concentration were observed. A normal NfL concentration was observed at this time. These results are consistent with the presence of Alzheimer's  related pathology. Spoke to wife and patint, decided to check for PD with DAT scan first and then if negative CT Amyloid scan for alzheimers.   Hgba1c 6 Ldl 122 CMP BUN 26 creat 1.56, AST 46 ALT 61 Cbc nml RPR NR  TSH nml Scheduled for sleep study July 9th.  MRI brain 08/17/2022: reviewed images: IMPRESSION:    MRI  brain (with and without) demonstrating: - Mild periventricular and subcortical foci of nonspecific gliosis. - No acute findings.  check for Patkinson's disease with DAT scan first and then if this test is negative will order...  order CT Amyloid scan for alzheimers.   Diagnosed with mild cognitive impairment (read below on MCI) unclear if parkinson's disease or precursor to Alzheimer's disease. Increase aricept 10mg .   Sent telephone note to my team to call patient's wife: Please call wife. 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 We decided(patient here and wife on phne) to start with a scan to look for parkinson's disease(DAT Scan) and if negative go to a CT brain amyloid for alzheimer's. Also he has sleep study July 9th. In the meantime I increased his Aricept to 10mg .  We should be able to get this all completed within a few months. I'd like wife to be at te next appointment to discuss results and what we can do to treat him. Probably September sometime. Can you ask wife if she has a time in September maybe mid September to come to an appointment with patient and schedule it please? You can review the above.   I also sent him home with his AVS that describes the DAT scan and MCI if she wants to read it. We will call for the DAT Scan scheduling. Thanks  Orders Placed This Encounter  Procedures   NM BRAIN DATSCAN TUMOR LOC INFLAM SPECT 1 DAY   Basic Metabolic Panel   Meds ordered this encounter  Medications   donepezil (ARICEPT) 10 MG tablet    Sig: Take 1 tablet (10 mg total) by mouth at bedtime.    Dispense:  30 tablet    Refill:  6    Cc: Lucky Cowboy, MD,  Lucky Cowboy, MD  Naomie Dean, MD  [facility] [address] [place], [place] [address]  Phone [phone] Fax [phone]  I spent over 30 minutes of face-to-face and non-face-to-face time with patient on the  1.  MCI (mild cognitive impairment)   2. Chronic kidney disease, unspecified CKD stage   3. Parkinsonism, unspecified Parkinsonism type     diagnosis.  This included previsit chart review, lab review, study review, order entry, electronic health record documentation, patient education on the different diagnostic and therapeutic options, counseling and coordination of care, risks and benefits of management, compliance, or risk factor reduction

## 2022-12-15 ENCOUNTER — Telehealth: Payer: Self-pay | Admitting: Neurology

## 2022-12-15 LAB — BASIC METABOLIC PANEL
BUN/Creatinine Ratio: 16 (ref 10–24)
BUN: 26 mg/dL (ref 8–27)
CO2: 22 mmol/L (ref 20–29)
Calcium: 9.3 mg/dL (ref 8.6–10.2)
Chloride: 107 mmol/L — ABNORMAL HIGH (ref 96–106)
Creatinine, Ser: 1.65 mg/dL — ABNORMAL HIGH (ref 0.76–1.27)
Glucose: 88 mg/dL (ref 70–99)
Potassium: 5.2 mmol/L (ref 3.5–5.2)
Sodium: 143 mmol/L (ref 134–144)
eGFR: 44 mL/min/{1.73_m2} — ABNORMAL LOW (ref >59)

## 2022-12-15 NOTE — Telephone Encounter (Signed)
Healthteam advantage NPR sent to [facility] [phone]

## 2022-12-15 NOTE — Telephone Encounter (Signed)
Spoke with the pt's wife Rosalia Hammers (on [place]) and discussed the message below from Dr Lucia Gaskins. The patient's wife verbalized understanding and appreciation. She asked for a Wed or Thurs in the morning. I was able to schedule for September 25th at 7:30 AM. I did schedule this as a 1 hour appt.

## 2022-12-27 ENCOUNTER — Ambulatory Visit (INDEPENDENT_AMBULATORY_CARE_PROVIDER_SITE_OTHER): Payer: PPO | Admitting: Neurology

## 2022-12-27 DIAGNOSIS — R0683 Snoring: Secondary | ICD-10-CM

## 2022-12-27 DIAGNOSIS — G3184 Mild cognitive impairment, so stated: Secondary | ICD-10-CM

## 2022-12-27 DIAGNOSIS — G2581 Restless legs syndrome: Secondary | ICD-10-CM

## 2022-12-27 DIAGNOSIS — D692 Other nonthrombocytopenic purpura: Secondary | ICD-10-CM

## 2022-12-27 DIAGNOSIS — R29818 Other symptoms and signs involving the nervous system: Secondary | ICD-10-CM

## 2023-01-11 ENCOUNTER — Encounter: Payer: Self-pay | Admitting: Internal Medicine

## 2023-01-11 NOTE — Progress Notes (Unsigned)
Annual  Screening/Preventative Visit  & Comprehensive Evaluation & Examination   Future Appointments  Date Time Provider Department  01/12/2023                             cpe 10:00 AM Lucky Cowboy, MD [facility]  03/09/2023                             wellness  4:00 PM Raynelle Dick, NP [facility]  03/15/2023  7:30 AM Anson Fret, MD [facility]  01/26/2024                               cpe  10:00 AM Lucky Cowboy, MD [facility]             This very nice 72 y.o. WWM  with   HTN, HLD, T2_NIDDM  and Vitamin D Deficiency  presents for a Screening /Preventative Visit & comprehensive evaluation and management of multiple medical co-morbidities.  Patient has hx/o Gout & is not currently on meds. Patient has ongoing evaluation with Dr Lucia Gaskins with concern for  Short Term Memory Loss consequent Parkinson's Dz vs Altheimer's Dz.         HTN predates since 2000. Patient's BP has been controlled at home.  Today's BP is at goal - 138/68 . Patient denies any cardiac symptoms as chest pain, palpitations, shortness of breath, dizziness or ankle swelling.       Patient is Statin Intolerant & his lipids are not controlled with diet and  since last visit he has stopped taking his Gemfibrozil & Ezetimibe.  Last lipids were not at goal:  Lab Results  Component Value Date   CHOL 151 09/28/2022   HDL 47 09/28/2022   LDLCALC 83 09/28/2022   TRIG 109 09/28/2022   CHOLHDL 3.2 09/28/2022         Patient has hx/o T2_NIDDM  (2002) w/CKD3b  (GFR 44 )  and patient denies reactive hypoglycemic symptoms, visual blurring, diabetic polys, but does have paresthesias of his toes. Last A1c was not at goal:   Lab Results  Component Value Date   HGBA1C 6.0 (H) 09/28/2022         Finally, patient has history of Vitamin D Deficiency ("14" /2010) and last vitamin D was at goal:   Lab Results  Component Value Date   VD25OH 71 06/22/2022       Current Outpatient Medications  Medication  Instructions   ALPHA LIPOIC ACID Daily   aspirin EC  81 mg  Dail   VITAMIN D3     5,000 Units  Daily   VITAMIN B-12     Daily   TOPICORT 0.25 % crm 1 Application  Daily PRN   donepezil (ARICEPT)  10 mg,  Daily at bedtime   enalapril     10 mg Daily   Ezetimibe   10 MG tablet Take  1 tablet  Daily     Magnesium 250 MG TABS Oral, 2 times daily     Allergies  Allergen Reactions   Crestor [Rosuvastatin] Diarrhea   Welchol [Colesevelam Hcl] Diarrhea   Zoloft [Sertraline Hcl]     unknown   Penicillins     Past Medical History:  Diagnosis Date   Allergy    Bone spur    Left Shoulder   CKD  stage 3a due to type 2 diabetes mellitus (HCC) 07/18/2017   Fracture    right forearm   Gout    Hyperlipidemia    Hypertension    Other testicular hypofunction    PONV (postoperative nausea and vomiting)    Type II  diabetes mellitus       Health Maintenance  Topic Date Due   COVID-19 Vaccine (1) Never done   Zoster Vaccines- Shingrix (1 of 2) Never done   OPHTHALMOLOGY EXAM  07/21/2018   HEMOGLOBIN A1C  07/10/2020   COLONOSCOPY  11/07/2020   INFLUENZA VACCINE  01/18/2021   FOOT EXAM  12/02/2021   TETANUS/TDAP  12/26/2022   Hepatitis C Screening  Completed   PNA vac Low Risk Adult  Completed   HPV VACCINES  Aged Out     Immunization History  Administered Date(s) Administered   PPD Test 02/07/2014, 02/16/2015   Pneumococcal -13 04/12/2017   Pneumococcal -23 11/01/2010, 05/29/2018   Td 12/25/2012   Zoster, Live 12/25/2012    Colon - 11/07/2017 - Dr Myrtie Neither  - recc f/u Colon 3 years due may 2022 - (Overdue)                                                 Last Colon - 04/15/2022 - Dr Myrtie Neither - Recommended 5 year f/u Colon - Nov, 2028   Past Surgical History:  Procedure Laterality Date   CYST REMOVAL HAND     ORIF RADIAL FRACTURE Right 07/18/2019   Procedure: OPEN REDUCTION INTERNAL FIXATION (ORIF) RADIUS AND ULNA FRACTURE;  Surgeon: Roby Lofts, MD;  Location: [facility] OR;   Service: Orthopedics;  Laterality: Right;   PROSTATE BIOPSY  2010   WRIST SURGERY Right 1998     Family History  Problem Relation Age of Onset   Heart disease Father    Kidney disease Father    Heart failure Father    Heart disease Mother 73       stent   Colon cancer Neg Hx    Esophageal cancer Neg Hx    Liver cancer Neg Hx    Pancreatic cancer Neg Hx    Rectal cancer Neg Hx    Stomach cancer Neg Hx     Social History   Socioeconomic History   Marital status: Widowed   Number of children: 1 daughter  Occupational History   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 Does c/o fatigue. Eyes: Denies redness, blurred vision, diplopia, discharge, itchy or watery eyes.  ENT: Denies discharge, congestion, post nasal drip, epistaxis, sore throat, earache, hearing loss, dental pain, Tinnitus, Vertigo, Sinus pain or snoring.  Cardio: Denies chest pain, palpitations, irregular heartbeat, syncope, dyspnea, diaphoresis, orthopnea, PND, claudication or edema Respiratory: denies cough, dyspnea, DOE, pleurisy, hoarseness, laryngitis or wheezing.  Gastrointestinal: Denies dysphagia, heartburn, reflux, water brash, pain, cramps, nausea, vomiting, bloating, diarrhea, constipation, hematemesis, melena, hematochezia, jaundice or hemorrhoids Genitourinary: Denies dysuria, frequency, urgency, nocturia, hesitancy, discharge, hematuria or flank pain Musculoskeletal: Denies  arthralgia, myalgia, stiffness, Jt. Swelling, pain, limp or strain/sprain. Denies Falls. Skin: Denies puritis, rash, hives, warts, acne, eczema or change in skin lesion Neuro: No weakness, tremor, incoordination,  spasms, paresthesia or pain Psychiatric: Denies confusion, memory loss or sensory loss. Denies Depression. Endocrine: Denies change in weight, skin, hair change, nocturia, and paresthesia, diabetic polys, visual blurring or hyper / hypo glycemic episodes.  Heme/Lymph: No excessive bleeding, bruising or enlarged lymph nodes.   Physical Exam  BP 138/68   Pulse 64   Temp 97.7 F (36.5 C)   Resp 16   Ht 5' 6.5" (1.689 m)   Wt 145 lb (65.8 kg)   SpO2 99%   BMI 23.05 kg/m   General Appearance: Well nourished and well groomed and in no apparent distress.  Eyes: PERRLA, EOMs, conjunctiva no swelling or erythema, normal fundi and vessels. Sinuses: No frontal/maxillary tenderness ENT/Mouth: EACs patent / TMs  nl. Nares clear without erythema, swelling, mucoid exudates. Oral hygiene is good. No erythema, swelling, or exudate. Tongue normal, non-obstructing. Tonsils not swollen or erythematous. Hearing normal.  Neck: Supple, thyroid not palpable. No bruits, nodes or JVD. Respiratory: Respiratory effort normal.  BS equal and clear bilateral without rales, rhonci, wheezing or stridor. Cardio: Heart sounds are normal with regular rate and rhythm and no murmurs, rubs or gallops. Peripheral pulses are normal and equal bilaterally without edema. No aortic or femoral bruits. Chest: symmetric with normal excursions and percussion.  Abdomen: Soft, with Nl bowel sounds. Nontender, no guarding, rebound, hernias, masses, or organomegaly.  Lymphatics: Non tender without lymphadenopathy.  Musculoskeletal: Full ROM all peripheral extremities, joint stability, 5/5 strength, and normal gait. ? Sl cog-wheeling tone.  Skin: Warm and dry without rashes, lesions, cyanosis, clubbing or  ecchymosis.  Neuro: Cranial nerves intact, reflexes equal bilaterally. Normal muscle tone, no cerebellar symptoms.  Admits Short term recall is poor. Sensation intact. . Sl pill rolling tremor of thumbs.  Pysch: Alert and oriented X 3  with normal affect, insight and judgment appropriate.  Assessment and Plan  1. Annual Preventative/Screening Exam    2. Essential hypertension  - EKG 12-Lead - [place], RETROPERITNL ABD,  LTD - Urinalysis, Routine w reflex microscopic - Microalbumin / creatinine urine ratio - CBC with Differential/Platelet - COMPLETE METABOLIC PANEL WITH GFR - Magnesium - TSH  3. Hyperlipidemia associated with type 2 diabetes mellitus (HCC)  - EKG 12-Lead - [place], RETROPERITNL ABD,  LTD - Lipid panel - TSH  4. Type 2 diabetes mellitus with stage 3b chronic kidney                                              disease, without long-term current use of insulin (HCC)  - EKG 12-Lead - [place], RETROPERITNL ABD,  LTD - HM DIABETES FOOT EXAM - PR LOW EXTEMITY NEUR EXAM DOCUM - Parathyroid hormone, intact (no Ca) - Hemoglobin A1c - Insulin, random  5. Vitamin D deficiency  - VITAMIN D 25 Hydroxy (  6. Idiopathic gout  - Uric acid  7. Diabetic neuropathy, painful (HCC)   8. Senile purpura (HCC)   9. Statin myopathy   10. BPH with obstruction/lower urinary tract symptoms  - PSA  11. Prostate cancer screening  - PSA  12. Screening for colorectal cancer  - POC Hemoccult Bld/Stl (  13. Screening for heart disease  - EKG 12-Lead  14.  FHx: heart disease  - EKG 12-Lead - [place], RETROPERITNL ABD,  LTD  15. Screening for AAA (aortic abdominal aneurysm)  - [place], RETROPERITNL ABD,  LTD  16. Medication management  - Urinalysis, Routine w reflex microscopic - Microalbumin / creatinine urine ratio - CBC with Differential/Platelet - COMPLETE METABOLIC PANEL WITH GFR - Magnesium - Lipid panel - TSH - Hemoglobin A1c - Insulin, random - VITAMIN D 25 Hydroxy            Patient was counseled in prudent diet, weight control to achieve/maintain BMI less than 25, BP monitoring, regular exercise and medications as discussed.  Discussed med effects and SE's. Routine screening labs and tests as  requested with regular follow-up as recommended. Over 40 minutes of exam, counseling, chart review and high complex critical decision making was performed   Marinus Maw, MD

## 2023-01-11 NOTE — Patient Instructions (Signed)

## 2023-01-12 ENCOUNTER — Encounter: Payer: Self-pay | Admitting: Internal Medicine

## 2023-01-12 ENCOUNTER — Ambulatory Visit (INDEPENDENT_AMBULATORY_CARE_PROVIDER_SITE_OTHER): Payer: PPO | Admitting: Internal Medicine

## 2023-01-12 VITALS — BP 138/68 | HR 64 | Temp 97.7°F | Resp 16 | Ht 66.5 in | Wt 145.0 lb

## 2023-01-12 DIAGNOSIS — N1832 Type 2 diabetes mellitus with diabetic chronic kidney disease: Secondary | ICD-10-CM

## 2023-01-12 DIAGNOSIS — Z125 Encounter for screening for malignant neoplasm of prostate: Secondary | ICD-10-CM

## 2023-01-12 DIAGNOSIS — M1000 Idiopathic gout, unspecified site: Secondary | ICD-10-CM

## 2023-01-12 DIAGNOSIS — Z0001 Encounter for general adult medical examination with abnormal findings: Secondary | ICD-10-CM

## 2023-01-12 DIAGNOSIS — Z136 Encounter for screening for cardiovascular disorders: Secondary | ICD-10-CM

## 2023-01-12 DIAGNOSIS — E1169 Type 2 diabetes mellitus with other specified complication: Secondary | ICD-10-CM

## 2023-01-12 DIAGNOSIS — E559 Vitamin D deficiency, unspecified: Secondary | ICD-10-CM | POA: Diagnosis not present

## 2023-01-12 DIAGNOSIS — Z8249 Family history of ischemic heart disease and other diseases of the circulatory system: Secondary | ICD-10-CM

## 2023-01-12 DIAGNOSIS — Z79899 Other long term (current) drug therapy: Secondary | ICD-10-CM

## 2023-01-12 DIAGNOSIS — E1140 Type 2 diabetes mellitus with diabetic neuropathy, unspecified: Secondary | ICD-10-CM

## 2023-01-12 DIAGNOSIS — N138 Other obstructive and reflux uropathy: Secondary | ICD-10-CM | POA: Diagnosis not present

## 2023-01-12 DIAGNOSIS — N401 Enlarged prostate with lower urinary tract symptoms: Secondary | ICD-10-CM

## 2023-01-12 DIAGNOSIS — G720 Drug-induced myopathy: Secondary | ICD-10-CM

## 2023-01-12 DIAGNOSIS — E1122 Type 2 diabetes mellitus with diabetic chronic kidney disease: Secondary | ICD-10-CM | POA: Diagnosis not present

## 2023-01-12 DIAGNOSIS — D692 Other nonthrombocytopenic purpura: Secondary | ICD-10-CM

## 2023-01-12 DIAGNOSIS — E785 Hyperlipidemia, unspecified: Secondary | ICD-10-CM | POA: Diagnosis not present

## 2023-01-12 DIAGNOSIS — I10 Essential (primary) hypertension: Secondary | ICD-10-CM | POA: Diagnosis not present

## 2023-01-12 DIAGNOSIS — Z0000 Encounter for general adult medical examination without abnormal findings: Secondary | ICD-10-CM | POA: Diagnosis not present

## 2023-01-12 DIAGNOSIS — Z1211 Encounter for screening for malignant neoplasm of colon: Secondary | ICD-10-CM

## 2023-01-12 LAB — CBC WITH DIFFERENTIAL/PLATELET
Absolute Monocytes: 425 cells/uL (ref 200–950)
Basophils Absolute: 77 cells/uL (ref 0–200)
Basophils Relative: 1.3 %
Eosinophils Absolute: 218 cells/uL (ref 15–500)
Eosinophils Relative: 3.7 %
HCT: 44.8 % (ref 38.5–50.0)
Hemoglobin: 15.2 g/dL (ref 13.2–17.1)
Lymphs Abs: 1221 cells/uL (ref 850–3900)
MCV: 83.6 fL (ref 80.0–100.0)
MPV: 9.2 fL (ref 7.5–12.5)
Monocytes Relative: 7.2 %
Neutrophils Relative %: 67.1 %
Platelets: 259 10*3/uL (ref 140–400)
RBC: 5.36 10*6/uL (ref 4.20–5.80)
RDW: 13 % (ref 11.0–15.0)
Total Lymphocyte: 20.7 %

## 2023-01-13 LAB — COMPLETE METABOLIC PANEL WITH GFR: CO2: 29 mmol/L (ref 20–32)

## 2023-01-14 NOTE — Progress Notes (Signed)
^<^<^<^<^<^<^<^<^<^<^<^<^<^<^<^<^<^<^<^<^<^<^<^<^<^<^<^<^<^<^<^<^<^<^<^<^ ^>^>^>^>^>^>^>^>^>^>^>>^>^>^>^>^>^>^>^>^>^>^>^>^>^>^>^>^>^>^>^>^>^>^>^>^>  -    Kidney functions remain in Stage 3b  & are Stable   ^<^<^<^<^<^<^<^<^<^<^<^<^<^<^<^<^<^<^<^<^<^<^<^<^<^<^<^<^<^<^<^<^<^<^<^<^ ^>^>^>^>^>^>^>^>^>^>^>^>^>^>^>^>^>^>^>^>^>^>^>^>^>^>^>^>^>^>^>^>^>^>^>^>^  -  PSA - Low - No Prostate Cancer  - Great    !   ^<^<^<^<^<^<^<^<^<^<^<^<^<^<^<^<^<^<^<^<^<^<^<^<^<^<^<^<^<^<^<^<^<^<^<^<^ ^>^>^>^>^>^>^>^>^>^>^>^>^>^>^>^>^>^>^>^>^>^>^>^>^>^>^>^>^>^>^>^>^>^>^>^>^  -  Uric Acid Gout Test is Normal    ^<^<^<^<^<^<^<^<^<^<^<^<^<^<^<^<^<^<^<^<^<^<^<^<^<^<^<^<^<^<^<^<^<^<^<^<^ ^>^>^>^>^>^>^>^>^>^>^>^>^>^>^>^>^>^>^>^>^>^>^>^>^>^>^>^>^>^>^>^>^>^>^>^>^  -  Chol =  170    &   LDL = 96  - Both Great   - Very low risk for Heart Attack  / Stroke  ^>^>^>^>^>^>^>^>^>^>^>^>^>^>^>^>^>^>^>^>^>^>^>^>^>^>^>^>^>^>^>^>^>^>^>^>^ ^>^>^>^>^>^>^>^>^>^>^>^>^>^>^>^>^>^>^>^>^>^>^>^>^>^>^>^>^>^>^>^>^>^>^>^>^  -  Vitamin D = 68  - Excellent - Please continue dosage  same   ^<^<^<^<^<^<^<^<^<^<^<^<^<^<^<^<^<^<^<^<^<^<^<^<^<^<^<^<^<^<^<^<^<^<^<^<^ ^>^>^>^>^>^>^>^>^>^>^>^>^>^>^>^>^>^>^>^>^>^>^>^>^>^>^>^>^>^>^>^>^>^>^>^>^  -  All Else - CBC - Kidneys - Electrolytes - Liver - Magnesium & Thyroid    - all  Normal / OK  ^<^<^<^<^<^<^<^<^<^<^<^<^<^<^<^<^<^<^<^<^<^<^<^<^<^<^<^<^<^<^<^<^<^<^<^<^ ^>^>^>^>^>^>^>^>^>^>^>^>^>^>^>^>^>^>^>^>^>^>^>^>^>^>^>^>^>^>^>^>^>^>^>^>^

## 2023-01-15 NOTE — Progress Notes (Signed)
The PSG revealed very fragmented sleep, surprisingly no snoring, apnea  and only few limb movements. Abnormal EMG tone during REM sleep and bradycardia are the only findings. EMG tone can be related to REM BD / seen in LBD and PD.

## 2023-01-15 NOTE — Procedures (Signed)
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 Patient referred by Dr Lucia Gaskins., who followed for memory loss.  Parkinsonian symptoms were present. DAT scan is scheduled. Dr Lucia Gaskins :"[name] is a 72 y.o. male here as requested by Lucky Cowboy, MD for memory problems. Here with his wife who also provides mch information. He has short-term memory problems. More communicating he having difficulty finding words. More short-term, can remember 25 years ago. Still driving, no accidents in the hone, still takes own medications, not missing any bills, wife is here and she reports much information and she says some people say he hasn't been the same. Grandfather had dementia, dad passed away at 20, mom is 38 and no memory problems, one brother 40 and no dementia. Snores significantly, nods off, morning headaches, stress. No other focal neurologic deficits, associated symptoms, inciting events or modifiable factors".  ADDITIONAL INFORMATION:  The Epworth Sleepiness Scale endorsed at 6 /24 points (scores above or equal to 10 are suggestive of hypersomnolence). FSS endorsed at  26 /63 points. G Depression Score at 6/ 15 points.   Height: 69 in Weight: 147 lbs (BMI 21) Neck Size: 15 in  MEDICATIONS: Alpha Lipoic Acid, Aspirin, Vitamin D3, Vitamin B 12, Aricept, Vasotec, Zetia, Magnesium   TECHNICAL DESCRIPTION: A registered sleep technologist ( RPSGT)  was in attendance for the duration of the recording.  Data collection, scoring, video monitoring, and reporting were performed in compliance with the AASM Manual for the Scoring of Sleep and Associated  Events; (Hypopnea is scored based on the criteria listed in Section VIII D. 1b in the AASM Manual V2.6 using a 4% oxygen desaturation rule or Hypopnea is scored based on the criteria listed in Section VIII D. 1a in the AASM Manual V2.6 using 3% oxygen desaturation and /or arousal rule).   SLEEP CONTINUITY AND SLEEP ARCHITECTURE:  Lights-out was at 20:49: and lights-on at  05:00:, with  8.2 minutes of recording time . Total sleep time (TST) was 289.5 minutes with a decreased sleep efficiency at 59.0%.   Sleep latency was normal at 12.5 minutes.  REM sleep latency was increased at 114.0 minutes. Of the total sleep time, the percentage of stage N1 sleep was 18.3%, stage N2 sleep was 53%, stage N3 sleep was 15.5%, and REM sleep was 13.5%.  There were 2 Stage R periods observed on this study night, 47 awakenings (i.e. transitions to Stage W from any sleep stage), and 134 total stage transitions. Wake after sleep onset (WASO) time accounted for 189 minutes (!!!).   BODY POSITION:  TST was divided between the following sleep position:  supine 136 minutes (47%), non-supine 154 minutes (53%); right 20 minutes (7%), left 133 minutes (46%), and prone 00 minutes (0%). Total supine REM sleep time was 25 minutes (65% of total REM sleep).  RESPIRATORY MONITORING:  Based on CMS criteria (using a 4% oxygen desaturation rule for scoring hypopneas), there were 0 apneas (0 obstructive; 0 central; 0 mixed), and 1 hypopnea.   The Apnea index was 0.0/h.  Hypopnea index was 0.2/h.  The AHI (  apnea-hypopnea index)  was 0.2/h overall (0.4 supine, 0 non-supine; 1.5 REM, 2.4 supine REM). There were 0 respiratory effort-related arousals (RERAs).   OXIMETRY: Oxyhemoglobin Saturation Nadir during sleep was at  89% from a mean of 98%.  Of the Total sleep time (TST) hypoxemia (<89%) was present for 0.0 minutes, or 0.0% of total sleep time.  LIMB MOVEMENTS: There were 0 periodic limb movements of sleep (0.0/hr), of which 0 (0.0/hr)  were associated with an arousal. There were 0 occurrences of Cheyne Stokes breathing.   EKG:  NSR with PVCs. Hear rate varied between 44 and 70 bpm, average HR was only 51 bpm during sleep. AUDIO and VIDEO: frequent non-periodic movements with both, legs and arms. EMG did not drop out during REM sleep.  EEG:  PSG EEG was of  reduced amplitude but normal frequency, with symmetric manifestation of sleep stages.  AROUSAL: There were 114 arousals in total, for an arousal index of 20 arousals/hour.  Of these, 1 was identified as respiratory-related arousals (0 /h), 0 were PLM-related arousals (0 /h), and 113 were non-specific arousals (23 /h).  IMPRESSION: 1) Sleep disordered breathing was not present, nor was there clinically significant hypoxia noted.  Our recording did not find snoring to be present either.     2) Periodic limb movements were not seen- movements were rare and non-periodic. The EMG demonstrated almost constant muscle tone elevation well into REM sleep -this can be associated with REM BD.  3) Sleep efficiency was poor.  Sleep fragmentation was noted- The majority of sleep arousals was related to non -physiological arousals.  Total sleep time was reduced at 289.5 minutes.  Sleep efficiency was decreased at 59.0%.     RECOMMENDATIONS: Treatment of REM BD with Melatonin or Klonopin is a first step. Consider SSRI to reduce REM sleep proportion overall and delay REM sleep onset.  The findings are to be clinically correlated to the symptoms of memory loss and    Melvyn Novas, MD Cc Dr Lucia Gaskins , cc Dr Oneta Rack             General Information  Name: [name], [name] BMI: 21.71 Physician: Melvyn Novas, MD  ID: [id number] Height: 69.0 in Technician: Margaretann Loveless, RPSGT  Sex: Male Weight: 147.0 lb Record: [id number]  Age: 57 [07-24-1950] Date: 12/27/2022    Medical & Medication History    [name] is a 72 y.o. male patient who is seen upon referral on 09/06/2022 from Dr Lucia Gaskins  for a sleep evaluation in the setting of memory loss. 09-06-2022: Chief concern according to patient : " I am snoring , I go to bed early at 9 Pm and do wake up at midnight to urinate, and then I will struggle to resume sleeping. " He denies acting out dreams, sleep choking, coughing and he has sometimes neuropathy pain in his feet and leg cramping. He feels rested in AM. Every morning he rises at 6 AM. He is feeling well on 7 hours of sleep, sometimes more. I have the pleasure of seeing [name] 09/06/22 a right-handed male with a possible sleep disorder.  Alpha Lipoic Acid, Aspirin, Vitamin D3, Vitamin B 12, Aricept, Vasotec, Zetia, Magnesium   Sleep Disorder      Comments   The patient came into the sleep lab for a PSG/RBD. No restroom breaks. EKG kept in NSR. No snoring. Respiratory events scored with a 4% desat. The patient slept supine and lateral. All sleep stages observed. AHI was 0 after 2 hours of  TST. The patient had very fragmented sleep. He had a lot of WASO. Sleep efficiency was poor.   No movements were observed while the patient was in REM.  Some leg movements intermittently, non -periodic .     Lights out: 08:49:28 PM Lights on: 05:00:23 AM   Time Total Supine Side Prone Upright  Recording (TRT) 8h 11.67m 4h 4.78m 4h 6.59m 0h 0.8m 0h 0.68m  Sleep (TST) 4h 49.28m 2h 16.31m 2h 33.11m 0h 0.7m 0h 0.66m   Latency N1 N2 N3 REM Onset Per. Slp. Eff.  Actual 0h 12.54m 0h 19.49m 1h 46.81m 1h 54.56m 0h 12.13m 1h 20.110m 58.96%   Stg Dur Wake N1 N2 N3 REM  Total 189.0 53.0 152.5 45.0 39.0  Supine 100.5 33.0 61.0 16.5 25.5  Side 88.5 20.0 91.5 28.5 13.5  Prone 0.0 0.0 0.0 0.0 0.0  Upright 0.0 0.0 0.0 0.0 0.0   Stg % Wake N1 N2 N3 REM  Total 39.5 18.3 52.7 15.5 13.5  Supine 21.0 11.4 21.1 5.7 8.8  Side 18.5 6.9 31.6 9.8 4.7  Prone 0.0 0.0 0.0 0.0 0.0  Upright 0.0 0.0 0.0 0.0 0.0     Apnea Summary Sub Supine Side Prone Upright  Total 0 Total 0 0 0 0 0    REM 0 0 0 0 0    NREM 0 0 0 0 0  Obs  0 REM 0 0 0 0 0    NREM 0 0 0 0 0  Mix 0 REM 0 0 0 0 0    NREM 0 0 0 0 0  Cen 0 REM 0 0 0 0 0    NREM 0 0 0 0 0   Rera Summary Sub Supine Side Prone Upright  Total 0 Total 0 0 0 0 0    REM 0 0 0 0 0    NREM 0 0 0 0 0   Hypopnea Summary Sub Supine Side Prone Upright  Total 1 Total 1 1 0 0 0    REM 1 1 0 0 0    NREM 0 0 0 0 0   4% Hypopnea Summary Sub Supine Side Prone Upright  Total (4%) 1 Total 1 1 0 0 0    REM 1 1 0 0 0    NREM 0 0 0 0 0     AHI Total Obs Mix Cen  0.21 Apnea 0.00 0.00 0.00 0.00   Hypopnea 0.21 -- -- --  0.21 Hypopnea (4%) 0.21 -- -- --    Total Supine Side Prone Upright  Position AHI 0.21 0.44 0.00 0.00 0.00  REM AHI 1.54   NREM AHI 0.00   Position RDI 0.21 0.44 0.00 0.00 0.00  REM RDI 1.54   NREM RDI 0.00    4% Hypopnea Total Supine Side Prone Upright  Position AHI (4%) 0.21 0.44 0.00 0.00 0.00  REM AHI (4%) 1.54   NREM AHI (4%) 0.00   Position RDI (4%) 0.21 0.44 0.00 0.00 0.00  REM RDI (4%) 1.54   NREM RDI (4%) 0.00    Desaturation Information Threshold: 2% <100% <90% <80% <70% <60% <50% <40%  Supine 32.0 0.0 0.0 0.0 0.0 0.0 0.0  Side 19.0 1.0 0.0 0.0 0.0 0.0 0.0  Prone 0.0 0.0 0.0 0.0 0.0 0.0 0.0  Upright 0.0 0.0 0.0 0.0 0.0 0.0 0.0  Total 51.0 1.0 0.0 0.0 0.0 0.0 0.0  Index 6.4 0.1 0.0 0.0 0.0 0.0 0.0   Threshold: 3% <100% <90% <80% <  70% <60% <50% <40%  Supine 2.0 0.0 0.0 0.0 0.0 0.0 0.0  Side 3.0 1.0 0.0 0.0 0.0 0.0 0.0  Prone 0.0 0.0 0.0 0.0 0.0 0.0 0.0  Upright 0.0 0.0 0.0 0.0 0.0 0.0 0.0  Total 5.0 1.0 0.0 0.0 0.0 0.0 0.0  Index 0.6 0.1 0.0 0.0 0.0 0.0 0.0   Threshold: 4% <100% <90% <80% <70% <60% <50% <40%  Supine 1.0 0.0 0.0 0.0 0.0 0.0 0.0  Side 2.0 1.0 0.0 0.0 0.0 0.0 0.0  Prone 0.0 0.0 0.0 0.0 0.0 0.0 0.0  Upright 0.0 0.0 0.0 0.0 0.0 0.0 0.0  Total 3.0 1.0 0.0 0.0 0.0 0.0 0.0  Index 0.4 0.1 0.0 0.0 0.0 0.0 0.0   Threshold: 4% <100% <90% <80% <70% <60% <50% <40%  Supine 1 0 0 0 0 0 0  Side 2 1 0 0 0 0 0  Prone 0 0 0  0 0 0 0  Upright 0 0 0 0 0 0 0  Total 3 1 0 0 0 0 0   Awakening/Arousal Information # of Awakenings 47  Wake after sleep onset 189.3m  Wake after persistent sleep 138.67m   Arousal Assoc. Arousals Index  Apneas 0 0.0  Hypopneas 1 0.2  Leg Movements 0 0.0  Snore 0 0.0  PTT Arousals 0 0.0  Spontaneous 113 23.4  Total 114 23.6  Leg Movement Information PLMS LMs Index  Total LMs during PLMS 0 0.0  LMs w/ Microarousals 0 0.0   LM LMs Index  w/ Microarousal 0 0.0  w/ Awakening 0 0.0  w/ Resp Event 0 0.0  Spontaneous 16 3.3  Total 16 3.3     Desaturation threshold setting: 4% Minimum desaturation setting: 10 seconds SaO2 nadir: 87% The longest event was a 43 sec obstructive Hypopnea with a minimum SaO2 of 93%. The lowest SaO2 was 93% associated with a 43 sec obstructive Hypopnea. EKG Rates EKG Avg Max Min  Awake 59 122 49  Asleep 51 70 44  BRADYCARDIA

## 2023-01-18 ENCOUNTER — Telehealth: Payer: Self-pay | Admitting: Neurology

## 2023-01-18 NOTE — Telephone Encounter (Signed)
-----   Message from [place] [name] sent at 01/15/2023 12:47 PM EDT ----- The PSG revealed very fragmented sleep, surprisingly no snoring, apnea  and only few limb movements. Abnormal EMG tone during REM sleep and bradycardia are the only findings. EMG tone can be related to REM BD / seen in LBD and PD.

## 2023-01-18 NOTE — Telephone Encounter (Signed)
Called the patient and reviewed the sleep study results. Advised that the sleep study didn't indicate concern for OSA or low oxygen/or irregular heart rate. Advised there was a abnormal EMG tone that was present which can be indicative in patients that have LBD & PD (REM BD) Pt states that he will develop cramps in his legs during the night and have to get up to walk them off sometimes but other than that has but other than that had not had any other issues. He will call back to get the number to schedule the DAT scan  [name] nuclear medicine [phone]

## 2023-02-02 ENCOUNTER — Ambulatory Visit (HOSPITAL_COMMUNITY)
Admission: RE | Admit: 2023-02-02 | Discharge: 2023-02-02 | Disposition: A | Discharge status: Home / Self Care | Payer: PPO | Source: Ambulatory Visit | Attending: Neurology | Admitting: Neurology

## 2023-02-02 DIAGNOSIS — R251 Tremor, unspecified: Secondary | ICD-10-CM | POA: Diagnosis not present

## 2023-02-02 DIAGNOSIS — G20C Parkinsonism, unspecified: Secondary | ICD-10-CM | POA: Insufficient documentation

## 2023-02-02 DIAGNOSIS — G3184 Mild cognitive impairment, so stated: Secondary | ICD-10-CM | POA: Insufficient documentation

## 2023-02-02 DIAGNOSIS — R4189 Other symptoms and signs involving cognitive functions and awareness: Secondary | ICD-10-CM | POA: Diagnosis not present

## 2023-02-02 MED ORDER — POTASSIUM IODIDE (ANTIDOTE) 130 MG PO TABS
ORAL_TABLET | ORAL | Status: AC
  Filled 2023-02-02: qty 1

## 2023-02-02 MED ORDER — IOFLUPANE I 123 185 MBQ/2.5ML IV SOLN
4.4000 | Freq: Once | INTRAVENOUS | Status: AC | PRN
  Administered 2023-02-02: 4.4 via INTRAVENOUS
  Filled 2023-02-02: qty 5

## 2023-02-09 ENCOUNTER — Other Ambulatory Visit (HOSPITAL_COMMUNITY): Payer: PPO

## 2023-02-09 ENCOUNTER — Inpatient Hospital Stay (HOSPITAL_COMMUNITY): Admission: RE | Admit: 2023-02-09 | Payer: PPO | Source: Ambulatory Visit

## 2023-03-03 ENCOUNTER — Emergency Department (HOSPITAL_BASED_OUTPATIENT_CLINIC_OR_DEPARTMENT_OTHER): Payer: PPO | Admitting: Radiology

## 2023-03-03 ENCOUNTER — Emergency Department (HOSPITAL_BASED_OUTPATIENT_CLINIC_OR_DEPARTMENT_OTHER)
Admission: EM | Admit: 2023-03-03 | Discharge: 2023-03-03 | Disposition: A | Discharge status: Home / Self Care | Payer: PPO | Attending: Emergency Medicine | Admitting: Emergency Medicine

## 2023-03-03 ENCOUNTER — Encounter (HOSPITAL_BASED_OUTPATIENT_CLINIC_OR_DEPARTMENT_OTHER): Payer: Self-pay | Admitting: Emergency Medicine

## 2023-03-03 ENCOUNTER — Other Ambulatory Visit: Payer: Self-pay

## 2023-03-03 DIAGNOSIS — Z7982 Long term (current) use of aspirin: Secondary | ICD-10-CM | POA: Insufficient documentation

## 2023-03-03 DIAGNOSIS — M5459 Other low back pain: Secondary | ICD-10-CM | POA: Diagnosis not present

## 2023-03-03 DIAGNOSIS — E119 Type 2 diabetes mellitus without complications: Secondary | ICD-10-CM | POA: Insufficient documentation

## 2023-03-03 DIAGNOSIS — N189 Chronic kidney disease, unspecified: Secondary | ICD-10-CM | POA: Diagnosis not present

## 2023-03-03 DIAGNOSIS — M549 Dorsalgia, unspecified: Secondary | ICD-10-CM | POA: Diagnosis not present

## 2023-03-03 DIAGNOSIS — Z79899 Other long term (current) drug therapy: Secondary | ICD-10-CM | POA: Insufficient documentation

## 2023-03-03 DIAGNOSIS — M47816 Spondylosis without myelopathy or radiculopathy, lumbar region: Secondary | ICD-10-CM | POA: Diagnosis not present

## 2023-03-03 DIAGNOSIS — Y9389 Activity, other specified: Secondary | ICD-10-CM | POA: Diagnosis not present

## 2023-03-03 DIAGNOSIS — Y92007 Garden or yard of unspecified non-institutional (private) residence as the place of occurrence of the external cause: Secondary | ICD-10-CM | POA: Insufficient documentation

## 2023-03-03 DIAGNOSIS — I129 Hypertensive chronic kidney disease with stage 1 through stage 4 chronic kidney disease, or unspecified chronic kidney disease: Secondary | ICD-10-CM | POA: Diagnosis not present

## 2023-03-03 DIAGNOSIS — X500XXA Overexertion from strenuous movement or load, initial encounter: Secondary | ICD-10-CM | POA: Diagnosis not present

## 2023-03-03 DIAGNOSIS — M545 Low back pain, unspecified: Secondary | ICD-10-CM | POA: Insufficient documentation

## 2023-03-03 DIAGNOSIS — I700 Atherosclerosis of aorta: Secondary | ICD-10-CM | POA: Diagnosis not present

## 2023-03-03 LAB — CBC WITH DIFFERENTIAL/PLATELET
Abs Immature Granulocytes: 0.02 10*3/uL (ref 0.00–0.07)
Basophils Absolute: 0.1 10*3/uL (ref 0.0–0.1)
Basophils Relative: 1 %
Eosinophils Absolute: 0.2 10*3/uL (ref 0.0–0.5)
Eosinophils Relative: 2 %
HCT: 43.2 % (ref 39.0–52.0)
Hemoglobin: 14.4 g/dL (ref 13.0–17.0)
Immature Granulocytes: 0 %
Lymphocytes Relative: 15 %
Lymphs Abs: 1.4 10*3/uL (ref 0.7–4.0)
MCH: 28 pg (ref 26.0–34.0)
MCHC: 33.3 g/dL (ref 30.0–36.0)
MCV: 84 fL (ref 80.0–100.0)
Monocytes Absolute: 0.5 10*3/uL (ref 0.1–1.0)
Monocytes Relative: 6 %
Neutro Abs: 6.7 10*3/uL (ref 1.7–7.7)
Neutrophils Relative %: 76 %
Platelets: 254 10*3/uL (ref 150–400)
RBC: 5.14 MIL/uL (ref 4.22–5.81)
RDW: 13.5 % (ref 11.5–15.5)
WBC: 8.8 10*3/uL (ref 4.0–10.5)
nRBC: 0 % (ref 0.0–0.2)

## 2023-03-03 LAB — COMPREHENSIVE METABOLIC PANEL
ALT: 27 U/L (ref 0–44)
AST: 26 U/L (ref 15–41)
Albumin: 4.6 g/dL (ref 3.5–5.0)
Alkaline Phosphatase: 53 U/L (ref 38–126)
Anion gap: 13 (ref 5–15)
BUN: 35 mg/dL — ABNORMAL HIGH (ref 8–23)
CO2: 22 mmol/L (ref 22–32)
Calcium: 9.3 mg/dL (ref 8.9–10.3)
Chloride: 105 mmol/L (ref 98–111)
Creatinine, Ser: 1.79 mg/dL — ABNORMAL HIGH (ref 0.61–1.24)
GFR, Estimated: 40 mL/min — ABNORMAL LOW (ref >60)
Glucose, Bld: 141 mg/dL — ABNORMAL HIGH (ref 70–99)
Potassium: 4.5 mmol/L (ref 3.5–5.1)
Sodium: 140 mmol/L (ref 135–145)
Total Bilirubin: 0.6 mg/dL (ref 0.3–1.2)
Total Protein: 7.2 g/dL (ref 6.5–8.1)

## 2023-03-03 MED ORDER — HYDROMORPHONE HCL 1 MG/ML IJ SOLN
0.5000 mg | Freq: Once | INTRAMUSCULAR | Status: AC
  Administered 2023-03-03: 0.5 mg via INTRAVENOUS
  Filled 2023-03-03: qty 1

## 2023-03-03 MED ORDER — OXYCODONE HCL 5 MG PO TABS
5.0000 mg | ORAL_TABLET | ORAL | 0 refills | Status: AC | PRN
Start: 2023-03-03 — End: 2023-03-06

## 2023-03-03 MED ORDER — ACETAMINOPHEN 500 MG PO TABS
1000.0000 mg | ORAL_TABLET | Freq: Once | ORAL | Status: AC
  Administered 2023-03-03: 1000 mg via ORAL
  Filled 2023-03-03: qty 2

## 2023-03-03 MED ORDER — PREDNISONE 10 MG PO TABS: ORAL_TABLET | ORAL | 0 refills | Status: AC

## 2023-03-03 NOTE — ED Triage Notes (Signed)
Pt with left sided back pain radiating to left hip/groin area that began suddenly while working outside pulling on some metal sheeting.  Pt states pain is severe and feels "like a knife"

## 2023-03-03 NOTE — Discharge Instructions (Addendum)
You have been prescribed prednisone. Please take this medication as prescribed for the next 7 days (40mg  on days 1 and 2, 30mg  on days 3 and 4, 20mg  on days 5 and 6, 10mg  on day 7). Take this medication in the morning, as taking it at night may make it hard to sleep. If you are a diabetic, please monitor your blood sugars closely on this medication, as it can cause your blood sugar to rise.   Take 1000mg  of tylenol every 6 hours as needed for pain.  Do not take more then 4g per day.  You have been prescribed oxycodone.  You may take 1 tablet every 4 hours as needed for pain not controlled with Tylenol. This is a narcotic/controlled substance medication that has potential addicting qualities.  Do not drive or operate heavy machinery when taking this medicine as it can be sedating. Do not drink alcohol or take other sedating medications when taking this medicine for safety reasons.  Keep this out of reach of small children.    Follow-up with your PCP within the next week for recheck.  Return the ER if you have any new onset weakness, loss of bowel or bladder control, unexplained fever or chills, any other new or concerning symptoms

## 2023-03-03 NOTE — ED Triage Notes (Addendum)
Incorrect patient

## 2023-03-03 NOTE — ED Provider Notes (Signed)
[place] EMERGENCY DEPARTMENT AT [facility] Provider Note   CSN: [id number] Arrival date & time: 03/03/23  4098     History  Chief Complaint  Patient presents with   Back Pain    [name] is a 72 y.o. male with CKD, type 2 diabetes, hypertension, who presents with concern for left sided lower back pain that started after working on fixing his fence in the backyard this evening.  He was lifting wood when he felt pain in his left lower back.  Denies any radiation down his legs or numbness and tingling.  Denies any loss of bowel or bladder control, saddle anesthesia, fever or chills.  He was able to ambulate here to the ER, but notes significant pain.   Back Pain      Home Medications Prior to Admission medications   Medication Sig Start Date End Date Taking? Authorizing Provider  oxyCODONE (ROXICODONE) 5 MG immediate release tablet Take 1 tablet (5 mg total) by mouth every 4 (four) hours as needed for up to 3 days for severe pain or breakthrough pain (Pain not controlled with scheduled tylenol). 03/03/23 03/06/23 Yes Arabella Merles, PA-C  predniSONE (DELTASONE) 10 MG tablet Take 4 tablets (40 mg total) by mouth daily for 2 days, THEN 3 tablets (30 mg total) daily for 2 days, THEN 2 tablets (20 mg total) daily for 2 days, THEN 1 tablet (10 mg total) daily for 1 day. 03/03/23 03/10/23 Yes Arabella Merles, PA-C  ALPHA LIPOIC ACID PO Take by mouth.    [provider]  aspirin EC 81 MG tablet Take 81 mg by mouth daily. Swallow whole.    [provider]  Cholecalciferol (VITAMIN D3 PO) Take 5,000 Units by mouth daily.    [provider]  Cyanocobalamin (VITAMIN B-12 PO) Take by mouth daily.    [provider]  desoximetasone (TOPICORT) 0.25 % cream Apply 1 Application topically daily as needed (Skin rash). 12/07/22   Raynelle Dick, NP  donepezil (ARICEPT) 10 MG tablet Take 1 tablet (10 mg total) by mouth at bedtime. 12/14/22   Anson Fret, MD  enalapril (VASOTEC) 10 MG tablet Take 1 tablet (10 mg total) by mouth daily. 09/28/22 09/28/23  Raynelle Dick, NP  ezetimibe (ZETIA) 10 MG tablet Take  1 tablet  Daily  for Cholesterol 06/29/22   Lucky Cowboy, MD  Magnesium 250 MG TABS Take by mouth 2 (two) times daily.    [provider]      Allergies    Crestor [rosuvastatin], Welchol [colesevelam hcl], Zoloft [sertraline hcl], and Penicillins    Review of Systems   Review of Systems  Musculoskeletal:  Positive for back pain.    Physical Exam Updated Vital Signs BP 137/62   Pulse 65   Temp 98.3 F (36.8 C)   Resp 15   SpO2 96%  Physical Exam Vitals and nursing note reviewed.  Constitutional:      Appearance: Normal appearance.  HENT:     Head: Atraumatic.  Cardiovascular:     Rate and Rhythm: Normal rate and regular rhythm.  Pulmonary:     Effort: Pulmonary effort is normal.  Musculoskeletal:     Comments: No spinous process tenderness palpation Pain when sitting up in bed Able to ambulate with mild discomfort  Neurological:     General: No focal deficit present.     Mental Status: He is alert.     Comments: 4/5 strength of the lower extremities bilaterally Intact  sensation lower extremities bilaterally  Psychiatric:        Mood and Affect: Mood normal.        Behavior: Behavior normal.     ED Results / Procedures / Treatments   Labs (all labs ordered are listed, but only abnormal results are displayed) Labs Reviewed  COMPREHENSIVE METABOLIC PANEL - Abnormal; Notable for the following components:      Result Value   Glucose, Bld 141 (*)    BUN 35 (*)    Creatinine, Ser 1.79 (*)    GFR, Estimated 40 (*)    All other components within normal limits  CBC WITH DIFFERENTIAL/PLATELET    EKG None  Radiology DG Lumbar Spine 2-3 Views  Result Date: 03/03/2023 CLINICAL DATA:  Injury with left-sided back pain radiating to left hip/groin. EXAM: LUMBAR SPINE - 2-3 VIEW COMPARISON:   06/29/2022. FINDINGS: There is no evidence of acute lumbar spine fracture. Alignment is normal. Multilevel degenerative endplate changes and facet arthropathy are noted. There is atherosclerotic calcification of the abdominal aorta. IMPRESSION: Multilevel degenerative endplate changes and facet arthropathy in the lumbar spine. Electronically Signed   By: Thornell Sartorius M.D.   On: 03/03/2023 22:12    Procedures Procedures    Medications Ordered in ED Medications  HYDROmorphone (DILAUDID) injection 0.5 mg (0.5 mg Intravenous Given 03/03/23 2025)  HYDROmorphone (DILAUDID) injection 0.5 mg (0.5 mg Intravenous Given 03/03/23 2129)  HYDROmorphone (DILAUDID) injection 0.5 mg (0.5 mg Intravenous Given 03/03/23 2249)  acetaminophen (TYLENOL) tablet 1,000 mg (1,000 mg Oral Given 03/03/23 2247)    ED Course/ Medical Decision Making/ A&P                                 Medical Decision Making Amount and/or Complexity of Data Reviewed Labs: ordered. Radiology: ordered.  Risk OTC drugs. Prescription drug management.   72 y.o. male with pertinent past medical history of CKD, type 2 diabetes, hypertension presents to the ED for concern of left lower back pain  Differential diagnosis includes but is not limited to fracture, dislocation, herniated nucleus pulposus, nerve impingement, sciatica, muscle strain  ED Course:  Patient presents with concern for left-sided lower back pain that started after fixing his fence this evening.  Pain is localized to the left lower back and does not involve the spine or radiate down into his legs.  Tender to palpation over the left lower paraspinal muscles, no tenderness palpation of the spinous processes.  No red flag signs. No weakness or loss of sensation. He is able to ambulate and move with some discomfort.  Due to acute onset of pain, lumbar x-ray was obtained that shows degenerative changes but no acute findings.  Patient was given 1 mg dilaudid upon arrival for  pain since he has CKD and is unable to have Toradol.  This did help improve his pain but started to wear off by the time of discharge, so he is given another 0.5 mg dilaudid and 1000 mg Tylenol.  His wife is at bedside and able to drive patient home. Patient prescribed short 3 day course of oxycodone for breakthrough pain and prednisone taper. Appropriate for discharge home   Impression: Left sided lower back pain  Disposition:  The patient was discharged home with instructions to take 7-day prednisone taper.  Take scheduled tylenol every 6 hours, then may use oxycodone for breakthrough pain. Return precautions given.  Lab Tests: I Ordered, and personally  interpreted labs.  The pertinent results include:   CBC, CMP unremarkable  Imaging Studies ordered: I ordered imaging studies including lumbar x-ray I independently visualized the imaging with scope of interpretation limited to determining acute life threatening conditions related to emergency care. Imaging showed degenerative changes, no acute fractures or dislocations, disc spaces well maintained I agree with the radiologist interpretation   Co morbidities that complicate the patient evaluation  CKD, type 2 diabetes, hypertension              Final Clinical Impression(s) / ED Diagnoses Final diagnoses:  Acute left-sided low back pain without sciatica    Rx / DC Orders ED Discharge Orders          Ordered    predniSONE (DELTASONE) 10 MG tablet  Daily        03/03/23 2304    oxyCODONE (ROXICODONE) 5 MG immediate release tablet  Every 4 hours PRN        03/03/23 2304              Arabella Merles, PA-C 03/03/23 2356    Cathren Laine, MD 03/04/23 1646

## 2023-03-09 ENCOUNTER — Ambulatory Visit (INDEPENDENT_AMBULATORY_CARE_PROVIDER_SITE_OTHER): Payer: PPO | Admitting: Internal Medicine

## 2023-03-09 ENCOUNTER — Ambulatory Visit: Payer: PPO | Admitting: Nurse Practitioner

## 2023-03-09 ENCOUNTER — Encounter: Payer: Self-pay | Admitting: Internal Medicine

## 2023-03-09 VITALS — BP 144/70 | HR 66 | Temp 97.9°F | Resp 17 | Ht 66.5 in | Wt 149.6 lb

## 2023-03-09 DIAGNOSIS — M5440 Lumbago with sciatica, unspecified side: Secondary | ICD-10-CM

## 2023-03-09 MED ORDER — DEXAMETHASONE 4 MG PO TABS
ORAL_TABLET | ORAL | 0 refills | Status: DC
Start: 2023-03-09 — End: 2023-03-15

## 2023-03-09 NOTE — Progress Notes (Signed)
Future Appointments  Date Time Provider Department  03/09/2023 10:00 AM Lucky Cowboy, MD [facility]  03/15/2023  7:30 AM Anson Fret, MD [facility]  04/12/2023 11:00 AM Raynelle Dick, NP [facility]  07/13/2023 11:30 AM Lucky Cowboy, MD [facility]  10/12/2023 10:30 AM Raynelle Dick, NP [facility]  01/26/2024 10:00 AM Lucky Cowboy, MD [facility]    History of Present Illness:     This very nice 72 y.o. WWM  with   HTN, HLD, T2_NIDDM  and Vitamin D Deficiency  presents for 1 week f/u after ER visit on 9/13 with LBP w/o sciatica. LS Xrays showed DJD & patient was treated with a Predisone taper . Patient is still c/o pain in low back & radiating to posterior legs.    Current Outpatient Medications on File Prior to Visit  Medication Sig   ALPHA LIPOIC ACID  Take by mouth.   aspirin EC 81 MG tablet Take 81 mg by mouth daily. Swallow whole.   VITAMIN D3 Take 5,000 Units by mouth daily.   VITAMIN B-12 Take by mouth daily.   desoximetasone 0.25 % cream Apply 1 Application topically daily as needed   donepezil  10 MG tablet Take 1 tablet (10 mg total) by mouth at bedtime.   enalapril 10 MG tablet Take 1 tablet (10 mg total) by mouth daily.   ezetimibe  10 MG tablet Take  1 tablet  Daily  for Cholesterol   Magnesium 250 MG TABS Take by mouth 2 (two) times daily.   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 Neck - supple.  Chest - Clear equal BS. Cor - Nl HS. RRR w/o sig MGR. PP 1(+). No edema. MS- Tender low para lumbar spasm. (+) SLR bilat. Neuro -  Nl w/o focal abnormalities.  Assessment and Plan:   1. Acute bilateral low back pain with sciatica, r/o spinal stenosis  - MR Lumbar Spine Wo Contrast; Future  - dexamethasone  4 MG tablet;  Take 1 tab 3 x day - 3 days, then 2 x day - 3 days, then 1 tab daily   Dispense: 20 tablet   Follow Up Instructions:        I discussed the assessment and treatment plan with the patient. The patient was provided an opportunity to ask questions and all were answered. The patient agreed with the plan and demonstrated an understanding of the instructions.       The patient was advised to call back or seek an in-person evaluation if the symptoms worsen or if the condition fails to improve  as anticipated.   Marinus Maw, MD

## 2023-03-15 ENCOUNTER — Ambulatory Visit: Payer: PPO | Admitting: Neurology

## 2023-03-15 ENCOUNTER — Telehealth: Payer: Self-pay | Admitting: Neurology

## 2023-03-15 ENCOUNTER — Encounter: Payer: Self-pay | Admitting: Neurology

## 2023-03-15 VITALS — BP 158/73 | HR 62 | Ht 69.0 in | Wt 152.0 lb

## 2023-03-15 DIAGNOSIS — G8929 Other chronic pain: Secondary | ICD-10-CM | POA: Diagnosis not present

## 2023-03-15 DIAGNOSIS — E1122 Type 2 diabetes mellitus with diabetic chronic kidney disease: Secondary | ICD-10-CM | POA: Diagnosis not present

## 2023-03-15 DIAGNOSIS — E1142 Type 2 diabetes mellitus with diabetic polyneuropathy: Secondary | ICD-10-CM | POA: Diagnosis not present

## 2023-03-15 DIAGNOSIS — M5000 Cervical disc disorder with myelopathy, unspecified cervical region: Secondary | ICD-10-CM

## 2023-03-15 DIAGNOSIS — G3184 Mild cognitive impairment, so stated: Secondary | ICD-10-CM

## 2023-03-15 DIAGNOSIS — M5441 Lumbago with sciatica, right side: Secondary | ICD-10-CM

## 2023-03-15 DIAGNOSIS — R292 Abnormal reflex: Secondary | ICD-10-CM | POA: Diagnosis not present

## 2023-03-15 DIAGNOSIS — R269 Unspecified abnormalities of gait and mobility: Secondary | ICD-10-CM

## 2023-03-15 DIAGNOSIS — N1832 Chronic kidney disease, stage 3b: Secondary | ICD-10-CM | POA: Diagnosis not present

## 2023-03-15 DIAGNOSIS — R270 Ataxia, unspecified: Secondary | ICD-10-CM

## 2023-03-15 NOTE — Progress Notes (Addendum)
[facility]    Provider:  Dr Lucia Gaskins Requesting Provider: Lucky Cowboy, MD Primary Care Provider:  Lucky Cowboy, MD  CC:  [facility]  03/15/2023: His memory is not worse, stable. His DAT scan is negative. The most bothersome is word-finding. If he gets agitated he gets tremulous. Mother has tremor. His tremor appears high freq, low amplitude. We discussed essential and treatment which he declines due to risk. He has become more feeble and wife states he does not need to go on ladders and I agreed. He states he has been hard on his body. He is just not used to getting older. We discussed CT amyloid scan due to his elevated Ab-42 in the brain but he has protective E3/E3 and no e4 gene. Wife provides much information. He has a lot of pain and is getting an MRI on Friday. No spatial disorientation, still taking care of bills and his mother's as well, he may be depressed due to taking care of his mother and his brother had a stroke and it gets to him, wife states he is bored and only working 2 days a week. Discussed SSRIs.   Patient complains of symptoms per HPI as well as the following symptoms: memory loss, stable, low back pain . Pertinent negatives and positives per HPI. All others negative   Update 03/01/2023: [name], Porfirio Mylar, MD  P Gna-Pod 3 Results Cc: Anson Fret, MD; Lucky Cowboy, MD The PSG revealed very fragmented sleep, surprisingly no snoring, apnea  and only few limb movements. Abnormal EMG tone during REM sleep and bradycardia are the only findings. EMG tone can be related to REM BD / seen in LBD and PD.  12/14/2022: Diagnosed with mild cognitive impairment. Discussed parkinson's disease vs alzheimer's disease. Discussed lecanemab. Dicussed DAT Scan or Amyloid Pet Scan.   No e4 gene:  E3/E3 genetic testing ATN showed: A low beta-amyloid 42/40 and a high pTau181 concentration were observed. A normal NfL concentration was observed at this time. These results  are consistent with the presence of Alzheimer's  related pathology. Spoke to wife and patint, decided to check for PD with DAT scan first and then if negative CT Amyloid scan for alzheimers.   Hgba1c 6 Ldl 122 CMP BUN 26 creat 1.56, AST 46 ALT 61 Cbc nml RPR NR TSH nml Scheduled for sleep study July 9th.  MRI brain 08/17/2022: reviewed images: FINDINGS:    No abnormal lesions are seen on diffusion-weighted views to suggest acute ischemia. The cortical sulci, fissures and cisterns are normal in size and appearance. Lateral, third and fourth ventricle are normal in size and appearance. No extra-axial fluid collections are seen. No evidence of mass effect or midline shift.  Mild periventricular and subcortical foci of nonspecific T2 hyperintensities.  No abnormal lesions are seen on post contrast views.     On sagittal views the posterior fossa, pituitary gland and corpus callosum are unremarkable. No evidence of intracranial hemorrhage on SWI views. The orbits and their contents, paranasal sinuses and calvarium are unremarkable.  Intracranial flow voids are present.     IMPRESSION:    MRI brain (with and without) demonstrating: - Mild periventricular and subcortical foci of nonspecific gliosis. - No acute findings.  Patient complains of symptoms per HPI as well as the following symptoms: none . Pertinent negatives and positives per HPI. All others negative   HPI:  [name] is a 72 y.o. male here as requested by Lucky Cowboy, MD for memory problems. Here with his  wife who also provides mch information. He has short-term memory problems. More communicating he having difficulty finding words. More short-term, can remember 25 years ago. Still driving, no accidents in the hone, still takes own medications, not missing any bills, wife is here and she reports much information and she says some people say he hasn;t been the same. Grandfather had dementia, dad passed away at 44, mom is 48 and no  memory problems, one brother 50 and no dementia. Snores significantly, nods off, morning headaches, stress. No other focal neurologic deficits, associated symptoms, inciting events or modifiable factors.   Reviewed notes, labs and imaging from outside physicians, which showed:  Hgba1c 6.6  Tsh nml   Review of Systems: Patient complains of symptoms per HPI as well as the following symptoms neuropathy in feet. Pertinent negatives and positives per HPI. All others negative.   Social History   Socioeconomic History   Marital status: Married    Spouse name: Not on file   Number of children: Not on file   Years of education: Not on file   Highest education level: Not on file  Occupational History   Not on file  Tobacco Use   Smoking status: Never   Smokeless tobacco: Never  Vaping Use   Vaping status: Never Used  Substance and Sexual Activity   Alcohol use: Not Currently    Comment: maybe 2-3 beers in a year   Drug use: No   Sexual activity: Not on file  Other Topics Concern   Not on file  Social History Narrative   Lives at home with wife   Right handed   Caffeine: 1 cup per day, tries to have decaf instead   Social Determinants of Health   Financial Resource Strain: Not on file  Food Insecurity: Not on file  Transportation Needs: Not on file  Physical Activity: Not on file  Stress: Not on file  Social Connections: Not on file  Intimate Partner Violence: Not on file    Family History  Problem Relation Age of Onset   Heart disease Mother 36       stent   Heart disease Father    Kidney disease Father    Heart failure Father    Alzheimer's disease Paternal Grandfather    Colon cancer Neg Hx    Esophageal cancer Neg Hx    Liver cancer Neg Hx    Pancreatic cancer Neg Hx    Rectal cancer Neg Hx    Stomach cancer Neg Hx     Past Medical History:  Diagnosis Date   Allergy    Bone spur    Left Shoulder   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 (HCC)  07/18/2017   Fracture    right forearm   Gout    Hyperlipidemia    Hypertension    Other testicular hypofunction    PONV (postoperative nausea and vomiting)    Type II or unspecified type diabetes mellitus without mention of complication, not stated as uncontrolled     Patient Active Problem List   Diagnosis Date Noted   Chronic bilateral low back pain with right-sided sciatica 03/15/2023   Gait abnormality 03/15/2023   Parkinsonian features 09/06/2022   Snoring 09/06/2022   RLS (restless legs syndrome) 09/06/2022   MCI (mild cognitive impairment) 09/06/2022   Senile purpura (HCC) 01/08/2020   Statin myopathy 12/19/2019   Closed fracture of middle of right radius and ulna 07/16/2019   Diabetes, polyneuropathy (HCC) 12/17/2018  Statin intolerance 12/17/2018   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 (HCC) 07/18/2017   Vitamin D deficiency 08/29/2013   Medication management 08/29/2013   Hyperlipidemia associated with type 2 diabetes mellitus (HCC)    Hypertension    Type 2 diabetes mellitus with stage 3 chronic kidney disease (HCC)    Testosterone deficiency    Gout     Past Surgical History:  Procedure Laterality Date   CYST REMOVAL HAND     ORIF RADIAL FRACTURE Right 07/18/2019   Procedure: OPEN REDUCTION INTERNAL FIXATION (ORIF) RADIUS AND ULNA FRACTURE;  Surgeon: Roby Lofts, MD;  Location: [facility] OR;  Service: Orthopedics;  Laterality: Right;   PROSTATE BIOPSY  2010   WRIST SURGERY Right 1998    Current Outpatient Medications  Medication Sig Dispense Refill   ALPHA LIPOIC ACID PO Take by mouth.     aspirin EC 81 MG tablet Take 81 mg by mouth daily. Swallow whole.     Cholecalciferol (VITAMIN D3 PO) Take 5,000 Units by mouth daily.     Cyanocobalamin (VITAMIN B-12 PO) Take by mouth daily.     desoximetasone (TOPICORT) 0.25 % cream Apply 1 Application topically daily as needed (Skin rash). 30 g 1   donepezil (ARICEPT) 10 MG tablet Take 1 tablet (10 mg total) by  mouth at bedtime. 30 tablet 6   enalapril (VASOTEC) 10 MG tablet Take 1 tablet (10 mg total) by mouth daily. 90 tablet 3   ezetimibe (ZETIA) 10 MG tablet Take  1 tablet  Daily  for Cholesterol 90 tablet 3   Magnesium 250 MG TABS Take by mouth 2 (two) times daily.     No current facility-administered medications for this visit.    Allergies as of 03/15/2023 - Review Complete 03/15/2023  Allergen Reaction Noted   Crestor [rosuvastatin] Diarrhea 08/04/2013   Welchol [colesevelam hcl] Diarrhea 08/04/2013   Zoloft [sertraline hcl] Other (See Comments) 08/04/2013   Penicillins Other (See Comments) 08/04/2013    Vitals: BP (!) 158/73 (BP Location: Right Arm, Patient Position: Sitting) Comment: pt has severe back pain  Pulse 62   Ht 5\' 9"  (1.753 m)   Wt 152 lb (68.9 kg)   BMI 22.45 kg/m  Last Weight:  Wt Readings from Last 1 Encounters:  03/15/23 152 lb (68.9 kg)   Last Height:   Ht Readings from Last 1 Encounters:  03/15/23 5\' 9"  (1.753 m)      08/10/2022   10:48 AM  MMSE - Mini Mental State Exam  Orientation to time 4  Orientation to Place 4  Registration 3  Attention/ Calculation 1  Recall 1  Language- name 2 objects 2  Language- repeat 1  Language- follow 3 step command 3  Language- read & follow direction 1  Write a sentence 1  Copy design 0  Total score 21   I had a long d/w patient about her recent stroke, risk for recurrent stroke/TIAs, personally independently reviewed imaging studies and stroke evaluation results and answered questions.Continue Plavix  for secondary stroke prevention and maintain strict control of hypertension with blood pressure goal below 130/90, diabetes with hemoglobin A1c goal below 6.5% and lipids with LDL cholesterol goal below 70 mg/dL.Patient has h/o statin intolerance hence recommend new PCSK9 inhibitor like Praluent. Recommend she see her primary MD to get prescription. I also advised the patient to eat a healthy diet with plenty of whole  grains, cereals, fruits and vegetables, exercise regularly and maintain ideal body weight .Followup  in the future with me in  2 months or call earlier if necessary.    Assessment/Plan:  Lovely patient who is extremely worried about his memory, mmse 21/30, unknown FHx of dementia,PMHx DM with polyneuropathy, CKD, HLD, HTN. He describes imbalance and ataxia, he does have a gait impairment, he has swan neck posture, he has cramping, he has decreased ROM of the neck. Crepitus. Consider MRI cervical spine. He has a lumbar spine mri scheduled. Discussed PT.  They want to be followed and see.  Ataxia, gait impairment, chronic neck pain, parkinsonism, DAT is negative, need to evaluate for cervical myelopathy due to degenerative disk disease.   Diagnosed with mild cognitive impairment by formal memory testing. Discussed parkinson's disease vs alzheimer's disease. Discussed lecanemab. Dicussed DAT Scan or Amyloid Pet Scan. Spoke to wife and patint, decided to check for PD with DAT scan first and then if negative CT Amyloid scan for alzheimers: DAT Scan negative, "No reduced radiotracer activity in basal ganglia to suggest Parkinson's syndrome pathology."  No e4 gene:  E3/E3 genetic testing ATN showed: A low beta-amyloid 42/40 and a high pTau181 concentration were observed. A normal NfL concentration was observed at this time. These results are consistent with the presence of Alzheimer's  related pathology. Spoke to wife and patint, decided to check for PD with DAT scan first and then if negative CT Amyloid scan for alzheimers.   DAT scan was negative. Discussed CT Amyloid scan and new medications(lecanemab) as well as old medication. Donepezil has helped. Discussed watching bradycardia with donepezil. he may be depressed due to taking care of his mother and his brother had a stroke and it gets to him, wife states he is bored and only working 2 days a week. Discussed SSRIs.   Hgba1c 6 Ldl 122 CMP BUN 26 creat  1.61, AST 46 ALT 61 Cbc nml RPR NR TSH nml Scheduled for sleep study July 9th.  MRI brain 08/17/2022: reviewed images: IMPRESSION:    MRI brain (with and without) demonstrating: - Mild periventricular and subcortical foci of nonspecific gliosis. - No acute findings.  check for Patkinson's disease with DAT scan first and then if this test is negative will order...  order CT Amyloid scan for alzheimers.   Diagnosed with mild cognitive impairment (read below on MCI) unclear if parkinson's disease or precursor to Alzheimer's disease. Increase aricept 10mg .   Sent telephone note to my team to call patient's wife: Please call wife. 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 We decided(patient here and wife on phne) to start with a scan to look for parkinson's disease(DAT Scan) and if negative go to a CT brain amyloid for alzheimer's. Also he has sleep study July 9th. In the meantime I increased his Aricept to 10mg .  We should be able to get this all completed within a few months. I'd like wife to be at te next appointment to discuss results and what we can do to treat him. Probably September sometime. Can you ask wife if she has a time in September maybe mid September to come to an appointment with patient and schedule it please? You can review the above.   I spent over 30 minutes of face-to-face and non-face-to-face time with patient on the  1. MCI (mild cognitive impairment)   2. Type 2 diabetes mellitus with stage 3b chronic kidney disease, without long-term current use of insulin (HCC)   3. Diabetic polyneuropathy associated with type 2 diabetes  mellitus (HCC)   4. Chronic bilateral low back pain with right-sided sciatica   5. Gait abnormality    diagnosis.  This included previsit chart review, lab review, study review, order entry, electronic health record documentation, patient education on the different diagnostic and therapeutic  options, counseling and coordination of care, risks and benefits of management, compliance, or risk factor reduction   Cc: Lucky Cowboy, MD,  Lucky Cowboy, MD  Naomie Dean, MD  [facility] [address] [place], [place] [address]  Phone [phone] Fax [phone]  I spent over 30 minutes of face-to-face and non-face-to-face time with patient on the  1. MCI (mild cognitive impairment)   2. Type 2 diabetes mellitus with stage 3b chronic kidney disease, without long-term current use of insulin (HCC)   3. Diabetic polyneuropathy associated with type 2 diabetes mellitus (HCC)   4. Chronic bilateral low back pain with right-sided sciatica   5. Gait abnormality      diagnosis.  This included previsit chart review, lab review, study review, order entry, electronic health record documentation, patient education on the different diagnostic and therapeutic options, counseling and coordination of care, risks and benefits of management, compliance, or risk factor reduction

## 2023-03-15 NOTE — Telephone Encounter (Signed)
Spoke to wife.  She is asking if Dr. Lucia Gaskins will order a MRI neck (as pt is due to have a MRI lumbar w/o contrast due to an injury. on this Friday at 0800 at [address] [facility]  [phone].  Pt is no claustrophobic.  She is trying to see if can have done both at same time/sameday.  She said this was discussed this morning as a possibilty.  Dr. Oneta Rack 's office said that we could adjust the MRI LS order. ????   I relayed to wife that we cannot adjust another providers' order.  Please advise.

## 2023-03-15 NOTE — Telephone Encounter (Signed)
Pt's wife called again and stated that she was given information that she would like to discus with RN regarding the MRI and a change that needs to be made. Please advise.

## 2023-03-15 NOTE — Addendum Note (Signed)
Addended by: Naomie Dean B on: 03/15/2023 05:17 PM   Modules accepted: Orders

## 2023-03-15 NOTE — Telephone Encounter (Signed)
FYI-Pt's wife called stating that the pt is scheduled for an MRI on Friday and they contacted the PCP who ordered the MRI and had them change the whole order for Total Spine since the Neck was mentioned in today's appt.

## 2023-03-15 NOTE — Patient Instructions (Addendum)
Consider a repeat formal memory testing or a CT scan to look for amyloid(alzheimer's protein) in the brain.  Consider depression treatment if you feel that is something going on with your mood  Essential Tremor A tremor is trembling or shaking that a person cannot control. Most tremors affect the hands or arms. Tremors can also affect the head, vocal cords, legs, and other parts of the body. Essential tremor is a tremor without a known cause. Usually, it occurs while a person is trying to perform an action. It tends to get worse gradually as a person ages. What are the causes? The cause of this condition is not known, but it often runs in families. What increases the risk? You are more likely to develop this condition if: You have a family member with essential tremor. You are 72 years of age or older. What are the signs or symptoms? The main sign of a tremor is a rhythmic shaking of certain parts of your body that is uncontrolled and unintentional. You may: Have difficulty eating with a spoon or fork. Have difficulty writing. Nod your head up and down or side to side. Have a quivering voice. The shaking may: Get worse over time. Come and go. Be more noticeable on one side of your body. Get worse due to stress, tiredness (fatigue), caffeine, and extreme heat or cold. How is this diagnosed? This condition may be diagnosed based on: Your symptoms and medical history. A physical exam. There is no single test to diagnose an essential tremor. However, your health care provider may order tests to rule out other causes of your condition. These may include: Blood and urine tests. Imaging studies of your brain, such as a CT scan or MRI. How is this treated? Treatment for essential tremor depends on the severity of the condition. Mild tremors may not need treatment if they do not affect your day-to-day life. Severe tremors may need to be treated using one or more of the following  options: Medicines. Injections of a substance called botulinum toxin. Procedures such as deep brain stimulation (DBS) implantation or MRI-guided ultrasound treatment. Lifestyle changes. Occupational or physical therapy. Follow these instructions at home: Lifestyle  Do not use any products that contain nicotine or tobacco. These products include cigarettes, chewing tobacco, and vaping devices, such as e-cigarettes. If you need help quitting, ask your health care provider. Limit your caffeine intake as told by your health care provider. Try to get 8 hours of sleep each night. Find ways to manage your stress that fit your lifestyle and personality. Consider trying meditation or yoga. Try to anticipate stressful situations and allow extra time to manage them. If you are struggling emotionally with the effects of your tremor, consider working with a mental health provider. General instructions Take over-the-counter and prescription medicines only as told by your health care provider. Avoid extreme heat and extreme cold. Keep all follow-up visits. This is important. Visits may include physical therapy visits. Where to find more information [employer] of Neurological Disorders and Stroke: [employer] Contact a health care provider if: You experience any changes in the location or intensity of your tremors. You start having a tremor after starting a new medicine. You have a tremor with other symptoms, such as: Numbness. Tingling. Pain. Weakness. Your tremor gets worse. Your tremor interferes with your daily life. You feel down, blue, or sad for at least 2 weeks in a row. Worrying about your tremor and what other people think about you interferes with  your everyday life functions, including relationships, work, or school. Summary Essential tremor is a tremor without a known cause. Usually, it occurs when you are trying to perform an action. You are more likely to develop this  condition if you have a family member with essential tremor. The main sign of a tremor is a rhythmic shaking of certain parts of your body that is uncontrolled and unintentional. Treatment for essential tremor depends on the severity of the condition. This information is not intended to replace advice given to you by your health care provider. Make sure you discuss any questions you have with your health care provider. Document Revised: 03/26/2021 Document Reviewed: 03/26/2021 Elsevier Patient Education  2024 [employer].

## 2023-03-15 NOTE — Telephone Encounter (Signed)
Mri c-spine ordered thank you

## 2023-03-15 NOTE — Telephone Encounter (Signed)
[employer] adv NPR sent to [facility] centralized scheduling for [facility]. [phone]

## 2023-03-16 NOTE — Telephone Encounter (Signed)
They linked the cervical spine MRI to the already scheduled lumbar spine to do them at the same time per prio auth MRI TH.

## 2023-03-16 NOTE — Telephone Encounter (Signed)
Order for MRI cervical done per Dr. Lucia Gaskins.  I spoke to pt and relayed that it was ordered hopefully he is able to have done at same time as the MRI lumbar spine.  He will call today and see.  Appreciated call back.  I relayed to let his wife know too.

## 2023-03-17 ENCOUNTER — Ambulatory Visit (HOSPITAL_BASED_OUTPATIENT_CLINIC_OR_DEPARTMENT_OTHER)
Admission: RE | Admit: 2023-03-17 | Discharge: 2023-03-17 | Disposition: A | Discharge status: Home / Self Care | Payer: PPO | Source: Ambulatory Visit | Attending: Internal Medicine | Admitting: Internal Medicine

## 2023-03-17 DIAGNOSIS — R270 Ataxia, unspecified: Secondary | ICD-10-CM | POA: Diagnosis not present

## 2023-03-17 DIAGNOSIS — R269 Unspecified abnormalities of gait and mobility: Secondary | ICD-10-CM

## 2023-03-17 DIAGNOSIS — M48061 Spinal stenosis, lumbar region without neurogenic claudication: Secondary | ICD-10-CM | POA: Diagnosis not present

## 2023-03-17 DIAGNOSIS — M5440 Lumbago with sciatica, unspecified side: Secondary | ICD-10-CM

## 2023-03-17 DIAGNOSIS — M5000 Cervical disc disorder with myelopathy, unspecified cervical region: Secondary | ICD-10-CM | POA: Diagnosis not present

## 2023-03-17 DIAGNOSIS — M47812 Spondylosis without myelopathy or radiculopathy, cervical region: Secondary | ICD-10-CM | POA: Diagnosis not present

## 2023-03-17 DIAGNOSIS — M545 Low back pain, unspecified: Secondary | ICD-10-CM | POA: Diagnosis not present

## 2023-03-17 DIAGNOSIS — M4727 Other spondylosis with radiculopathy, lumbosacral region: Secondary | ICD-10-CM | POA: Diagnosis not present

## 2023-03-17 DIAGNOSIS — R292 Abnormal reflex: Secondary | ICD-10-CM | POA: Diagnosis not present

## 2023-03-17 DIAGNOSIS — M50223 Other cervical disc displacement at C6-C7 level: Secondary | ICD-10-CM | POA: Diagnosis not present

## 2023-03-17 DIAGNOSIS — S3992XA Unspecified injury of lower back, initial encounter: Secondary | ICD-10-CM | POA: Diagnosis not present

## 2023-03-17 DIAGNOSIS — M4726 Other spondylosis with radiculopathy, lumbar region: Secondary | ICD-10-CM | POA: Diagnosis not present

## 2023-03-17 DIAGNOSIS — M4802 Spinal stenosis, cervical region: Secondary | ICD-10-CM | POA: Diagnosis not present

## 2023-03-17 DIAGNOSIS — M50221 Other cervical disc displacement at C4-C5 level: Secondary | ICD-10-CM | POA: Diagnosis not present

## 2023-03-27 ENCOUNTER — Telehealth: Payer: Self-pay | Admitting: Neurology

## 2023-03-27 NOTE — Telephone Encounter (Signed)
Pt's wife(,[name] Alona Bene ) is asking for a call with results to MRI .  She'd also like to know if Dr Lucia Gaskins got her flowers that she sent to her.  Wife states Dr Lucia Gaskins my not have realized it was from her because she sent in her last name of Alona Bene and not Puig.

## 2023-03-27 NOTE — Telephone Encounter (Signed)
Spoke to patient states severe back pain Called [facility] where MRI was done ON 03/17/23 rep from [facility] states hasn't been read yet will call [facility] imaging to have it pulled and read Pt was very appreciative. Informed since he has severe back pain don't wait until imaging is read go to urgent care or ED to be evaluated  . Pt expressed understanding and thanked me for calling

## 2023-03-28 ENCOUNTER — Other Ambulatory Visit: Payer: Self-pay | Admitting: Internal Medicine

## 2023-03-28 DIAGNOSIS — S22080S Wedge compression fracture of T11-T12 vertebra, sequela: Secondary | ICD-10-CM

## 2023-03-28 DIAGNOSIS — M48061 Spinal stenosis, lumbar region without neurogenic claudication: Secondary | ICD-10-CM

## 2023-03-28 DIAGNOSIS — M5440 Lumbago with sciatica, unspecified side: Secondary | ICD-10-CM

## 2023-03-30 ENCOUNTER — Telehealth: Payer: Self-pay

## 2023-03-30 NOTE — Telephone Encounter (Signed)
Spoke with patient to let him know that a referral  has been placed for Dr. Yevette Edwards with orthopaedic.

## 2023-03-31 ENCOUNTER — Telehealth: Payer: Self-pay | Admitting: *Deleted

## 2023-03-31 DIAGNOSIS — M5000 Cervical disc disorder with myelopathy, unspecified cervical region: Secondary | ICD-10-CM

## 2023-03-31 DIAGNOSIS — R269 Unspecified abnormalities of gait and mobility: Secondary | ICD-10-CM

## 2023-03-31 NOTE — Telephone Encounter (Signed)
I called pt and relayed that MRI cervical does show some :      IMPRESSION: 1. No acute abnormality of the cervical spine. 2. Multilevel degenerative changes of the cervical spine with severe right neural foraminal narrowing at C3-C4 and moderate right neural foraminal narrowing at C4-C5. 3. Mild spinal canal narrowing at C6-C7.  Dr. Lucia Gaskins recommended to see spine specialist about this as well since will be seeing spine specialist Dr. Yevette Edwards, per Dr. Kathryne Sharper note (from MRI lumbar spine).  Pt was having issues with phone and not getting call, was at [employer] as we were speaking and will see about LS and mention about the CS.  He is to call back if questions.  Issues.

## 2023-04-04 ENCOUNTER — Telehealth: Payer: Self-pay | Admitting: Neurology

## 2023-04-04 DIAGNOSIS — M545 Low back pain, unspecified: Secondary | ICD-10-CM | POA: Diagnosis not present

## 2023-04-04 NOTE — Telephone Encounter (Signed)
Referral for orthopedics fax to [facility]. Phone: [phone], Fax: [phone]

## 2023-04-11 NOTE — Progress Notes (Unsigned)
AWV and 3 MONTH FOLLOW UP Assessment:  Encounter for Medicare Annual Wellness  Due annually Referred to Dr. Elmer Picker for diabetic eye exam Colonoscopy UTD 04/15/22 with Dr. Myrtie Neither, due 2028  Essential hypertension Continue enalapril 10 mg QD Monitor blood pressure at home; call if consistently over 130/80 Continue DASH diet.   Reminder to go to the ER if any CP, SOB, nausea, dizziness, severe HA, changes vision/speech, left arm numbness and tingling and jaw pain. 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  -     Lipid panel  Testosterone deficiency Has opted against supplementation Zinc 50 mg daily supplement, high-intensity interval exercises  Senile Purpura(HCC) Skin protection Monitor  Idiopathic gout, unspecified Diet discussed Had recent flare at beginning of December, has resolved  Vitamin D deficiency At goal at check on 12/02/21 Defer vitamin D check until next visit  Medication management -     CBC with Differential/Platelet -     CMP  Diabetic polyneuropathy associated with type 2 diabetes mellitus (HCC) No longer on Gabapentin, using alpha lipoic acid with some benefit Continue weight loss Not  checking his blood sugars  Alzheimers Continue to follow with Dr. Lucia Gaskins, has further testing upcoming Continue Aricept scored 8 on 6CIT  Parkinsonian features Continue to follow with Dr. Vickey Huger Tremor of hand and legs  Visual changes Blurred in left eye Referred to Dr. Elmer Picker for diabetic eye exam  Over 30 minutes of exam, counseling, chart review, and critical decision making was performed  Future Appointments  Date Time Provider Department Center  07/13/2023 11:30 AM Lucky Cowboy, MD [facility] None  09/13/2023  8:00 AM Anson Fret, MD [facility] None  10/12/2023 10:30 AM Raynelle Dick, NP [facility] None  01/26/2024 10:00 AM Lucky Cowboy, MD [facility] None     Plan:   During the course of the visit the patient was educated and counseled about appropriate screening and preventive services including:   Pneumococcal vaccine  Prevnar 13 Influenza vaccine Td vaccine Screening electrocardiogram Bone densitometry screening Colorectal cancer screening Diabetes screening Glaucoma screening Nutrition counseling  Advanced directives: requested    Subjective:  [name] is a 72 y.o. male who presents for Medicare Annual Wellness Visit and 3 month follow up for HTN, hyperlipidemia, T2DM with CKD 3, and vitamin D Def.   He fell in September x 2 and hurt his back. He is having back pain in lower back when he picks up heavy objects. Is being evaluated by neurosurgery- following for T12 compression deformity.  Trying to avoid surgery. He has been getting leg cramps at night.    When he sits for a long period of time he will get tingling in his right leg and occasionally numb. He is being followed by Dr. Vickey Huger for parkinson's  like features and his memory.  Is taking Aricept, following with Dr. Lucia Gaskins. Doing more studies to determine if Alzheimers pathology. Believes his memory has stayed stable  Last visit 03/15/23. Normal sleep study   His mom has gone to an skilled  nursing facility in [place] and this is very stressful for him.  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  He continues to do a lot of fishing.   BMI is Body mass index is 24.39 kg/m., he has been working on diet- avoiding sweets and soda. Increased water, continues to exercise- walking.  Wt Readings from Last 3 Encounters:  04/12/23 153 lb 6.4 oz (69.6 kg)  03/15/23 152 lb (68.9 kg)  03/09/23 149 lb 9.6 oz (67.9 kg)   He is getting up twice at night to urinate.  His blood pressure has been controlled at home usually 110-130/60-70, today their BP is BP: 138/62. He is checking his blood pressure and running normal , stopped taking his enalapril BP Readings from Last 3 Encounters:  04/12/23 138/62  03/15/23 (!) 158/73  03/09/23 (!) 144/70  He does not workout - but walks extensively at home and fishes on his days off.  He denies chest pain, shortness of breath, dizziness.    He is on cholesterol medication ezetimibe (intolerant of several statins)  His cholesterol is not at goal. The cholesterol last visit was:   Lab Results  Component Value Date   CHOL 170 01/12/2023   HDL 57 01/12/2023   LDLCALC 96 01/12/2023   TRIG 84 01/12/2023   CHOLHDL 3.0 01/12/2023   He has been working on diet and exercise for T2 diabetes With hyperlipdiemia- has statin myopathy With CKD With neuropathy no longer taking Gabapentin , using alpha lipoic acid and does seem to help and denies increased appetite, nausea, paresthesia of the feet, polydipsia, polyuria, visual disturbances, vomiting and weight loss Not checking his blood sugars Last A1C in the office was:  Lab Results  Component Value Date   HGBA1C 6.0 (H) 09/28/2022   Drinking 2-3 bottles of water a day. Usually 1 soda a day.Last GFR Lab Results  Component Value Date   EGFR 40 (L) 01/12/2023     Patient is on Vitamin D supplement but remained below goal at most recent check:    Lab  Results  Component Value Date   VD25OH 68 01/12/2023     Patient is on allopurinol for gout and does not report a recent flare.  Lab Results  Component Value Date   LABURIC 7.5 01/12/2023     Medication Review:   Current Outpatient Medications (Cardiovascular):    enalapril (VASOTEC) 10 MG tablet, Take 1 tablet (10 mg total) by mouth daily.   ezetimibe (ZETIA) 10 MG tablet, Take  1 tablet  Daily  for Cholesterol   Current Outpatient Medications (Analgesics):    aspirin EC 81 MG tablet, Take 81 mg by mouth daily. Swallow whole.  Current Outpatient Medications (Hematological):    Cyanocobalamin (VITAMIN B-12 PO), Take by mouth daily.  Current Outpatient Medications (Other):    ALPHA LIPOIC ACID PO, Take by mouth.   Cholecalciferol (VITAMIN D3 PO), Take 5,000 Units by mouth daily.   desoximetasone (TOPICORT) 0.25 % cream, Apply 1 Application topically daily as needed (Skin rash).   donepezil (ARICEPT) 10 MG tablet, Take 1 tablet (10 mg total) by mouth at bedtime.   Magnesium 250 MG TABS, Take by mouth 2 (two) times  daily.  Allergies: Allergies  Allergen Reactions   Crestor [Rosuvastatin] Diarrhea   Welchol [Colesevelam Hcl] Diarrhea   Zoloft [Sertraline Hcl] Other (See Comments)    unknown   Penicillins Other (See Comments)    Current Problems (verified) has Hyperlipidemia associated with type 2 diabetes mellitus (HCC); Hypertension; Type 2 diabetes mellitus with stage 3 chronic kidney disease (HCC); Testosterone deficiency; Gout; Vitamin D deficiency; Medication management; 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 (HCC); Diabetes, polyneuropathy (HCC); Statin intolerance; Closed fracture of middle of right radius and ulna; Statin myopathy; Senile purpura (HCC); Parkinsonian features; Snoring; RLS (restless legs syndrome); MCI (mild cognitive impairment); Chronic bilateral low back pain with right-sided sciatica; and Gait abnormality on their problem list.  Screening  Tests Immunization History  Administered Date(s) Administered   PPD Test 02/07/2014, 02/16/2015   Pneumococcal Conjugate-13 04/12/2017   Pneumococcal Polysaccharide-23 11/01/2010, 05/29/2018   Td 12/25/2012   Zoster, Live 12/25/2012   Health Maintenance  Topic Date Due   COVID-19 Vaccine (1) Never done   Zoster Vaccines- Shingrix (1 of 2) 03/01/1970   OPHTHALMOLOGY EXAM  07/21/2018   DTaP/Tdap/Td (2 - Tdap) 12/26/2022   INFLUENZA VACCINE  Never done   Medicare Annual Wellness (AWV)  03/25/2023   HEMOGLOBIN A1C  03/30/2023   FOOT EXAM  01/11/2024   Diabetic kidney evaluation - Urine ACR  01/12/2024   Diabetic kidney evaluation - eGFR measurement  03/02/2024   Colonoscopy  04/16/2027   Pneumonia Vaccine 24+ Years old  Completed   Hepatitis C Screening  Completed   HPV VACCINES  Aged Out     Names of Other Physician/Practitioners you currently use: 1. [facility] here for primary care 2. Dr. Elmer Picker, eye doctor, last visit 2019- Needs to schedule 3. Dr. Alvester Morin, dentist, last visit 2022  Patient Care Team: Lucky Cowboy, MD as PCP - General (Internal Medicine) Charlett Nose, [facility] (Inactive) as Pharmacist (Pharmacist)  Surgical: He  has a past surgical history that includes Wrist surgery (Right, 1998); Prostate biopsy (2010); Cyst removal hand; and ORIF radial fracture (Right, 07/18/2019). Family His family history includes Alzheimer's disease in his paternal grandfather; Heart disease in his father; Heart disease (age of onset: 52) in his mother; Heart failure in his father; Kidney disease in his father. Social history  He reports that he has never smoked. He has never used smokeless tobacco. He reports that he does not currently use alcohol. He reports that he does not use drugs.  MEDICARE WELLNESS OBJECTIVES: Physical activity:  walking Cardiac risk factors: Cardiac Risk Factors include: advanced age (>54men, >54 women);diabetes  mellitus;dyslipidemia;hypertension;male gender Depression/mood screen:      04/12/2023   11:51 AM  Depression screen PHQ 2/9  Decreased Interest 0  Down, Depressed, Hopeless 1  PHQ - 2 Score 1    ADLs:     04/12/2023   11:49 AM 03/09/2023   10:05 PM  In your present state of health, do you have any difficulty performing the following activities:  Hearing? 1 0  Vision? 0 0  Difficulty concentrating or making decisions? 0 0  Walking or climbing stairs? 1 0  Dressing or bathing? 0 0  Doing errands, shopping? 0 0  Preparing Food and eating ? N   Using the Toilet? N   In the past six months, have you accidently leaked urine? N   Do you have problems with loss of bowel control? N   Managing your Medications? N   Managing  your Finances? N   Housekeeping or managing your Housekeeping? N      Cognitive Testing  Alert? Yes  Normal Appearance?Yes  Oriented to person? Yes  Place? Yes   Time? Yes  Recall of three objects?  2/3  Can perform simple calculations? Yes  Displays appropriate judgment?Yes  Can read the correct time from a watch face?Yes  EOL planning: Does Patient Have a Medical Advance Directive?: No Would patient like information on creating a medical advance directive?: No - Patient declined    04/12/2023   11:52 AM  6CIT Screen  What Year? 0 points  What month? 0 points  What time? 0 points  Count back from 20 0 points  Months in reverse 4 points  Repeat phrase 4 points  Total Score 8 points       Review of Systems  Constitutional:  Negative for malaise/fatigue and weight loss.  HENT:  Negative for hearing loss and tinnitus.   Eyes:  Positive for blurred vision (left eye). Negative for double vision.  Respiratory:  Negative for cough, shortness of breath and wheezing.   Cardiovascular:  Negative for chest pain, palpitations, orthopnea, claudication and leg swelling.  Gastrointestinal:  Negative for abdominal pain, blood in stool, constipation, diarrhea,  heartburn, melena, nausea and vomiting.  Genitourinary: Negative.   Musculoskeletal:  Positive for back pain (RLQ) and joint pain (Right knee). Negative for myalgias.  Skin:  Negative for rash.  Neurological:  Positive for tingling (on right lower leg/foot) and tremors. Negative for dizziness, sensory change, weakness and headaches.  Endo/Heme/Allergies:  Negative for polydipsia.  Psychiatric/Behavioral:  Positive for memory loss.   All other systems reviewed and are negative.    Objective:   Today's Vitals   04/12/23 1101  BP: 138/62  Pulse: (!) 56  Temp: (!) 97.5 F (36.4 C)  SpO2: 99%  Weight: 153 lb 6.4 oz (69.6 kg)  Height: 5' 6.5" (1.689 m)     Body mass index is 24.39 kg/m.  General appearance: alert, no distress, WD/WN, male HEENT: normocephalic, sclerae anicteric, TMs pearly, nares patent, no discharge or erythema, pharynx normal Oral cavity: MMM, no lesions Neck: supple, no lymphadenopathy, no thyromegaly, no masses Heart: RRR, normal S1, S2, no murmurs Lungs: CTA bilaterally, no wheezes, rhonchi, or rales Abdomen: +bs, soft, non tender, non distended, no masses, no hepatomegaly, no splenomegaly Skin: warm, dry.  Musculoskeletal: nontender, no swelling, no obvious deformity Extremities: no edema, no cyanosis, no clubbing Pulses: 2+ symmetric, upper and lower extremities, normal cap refill Neurological: alert, oriented x 3, CN2-12 intact, strength normal upper extremities and lower extremities, sensation normal throughout, DTRs 2+ throughout, no cerebellar signs,shuffling gait Psychiatric: flat affect, behavior normal, pleasant     Medicare Attestation I have personally reviewed: The patient's medical and social history Their use of alcohol, tobacco or illicit drugs Their current medications and supplements The patient's functional ability including ADLs,fall risks, home safety risks, cognitive, and hearing and visual impairment Diet and physical  activities Evidence for depression or mood disorders  The patient's weight, height, BMI, and visual acuity have been recorded in the chart.  I have made referrals, counseling, and provided education to the patient based on review of the above and I have provided the patient with a written personalized care plan for preventive services.     Raynelle Dick, NP   04/12/2023

## 2023-04-12 ENCOUNTER — Ambulatory Visit (INDEPENDENT_AMBULATORY_CARE_PROVIDER_SITE_OTHER): Payer: PPO | Admitting: Nurse Practitioner

## 2023-04-12 ENCOUNTER — Encounter: Payer: Self-pay | Admitting: Nurse Practitioner

## 2023-04-12 VITALS — BP 138/62 | HR 56 | Temp 97.5°F | Ht 66.5 in | Wt 153.4 lb

## 2023-04-12 DIAGNOSIS — Z0001 Encounter for general adult medical examination with abnormal findings: Secondary | ICD-10-CM

## 2023-04-12 DIAGNOSIS — R29818 Other symptoms and signs involving the nervous system: Secondary | ICD-10-CM

## 2023-04-12 DIAGNOSIS — N183 Chronic kidney disease, stage 3 unspecified: Secondary | ICD-10-CM | POA: Diagnosis not present

## 2023-04-12 DIAGNOSIS — G3184 Mild cognitive impairment, so stated: Secondary | ICD-10-CM

## 2023-04-12 DIAGNOSIS — I10 Essential (primary) hypertension: Secondary | ICD-10-CM | POA: Diagnosis not present

## 2023-04-12 DIAGNOSIS — N1832 Chronic kidney disease, stage 3b: Secondary | ICD-10-CM | POA: Diagnosis not present

## 2023-04-12 DIAGNOSIS — E559 Vitamin D deficiency, unspecified: Secondary | ICD-10-CM

## 2023-04-12 DIAGNOSIS — R7309 Other abnormal glucose: Secondary | ICD-10-CM | POA: Diagnosis not present

## 2023-04-12 DIAGNOSIS — Z79899 Other long term (current) drug therapy: Secondary | ICD-10-CM

## 2023-04-12 DIAGNOSIS — E349 Endocrine disorder, unspecified: Secondary | ICD-10-CM

## 2023-04-12 DIAGNOSIS — E1169 Type 2 diabetes mellitus with other specified complication: Secondary | ICD-10-CM | POA: Diagnosis not present

## 2023-04-12 DIAGNOSIS — G720 Drug-induced myopathy: Secondary | ICD-10-CM

## 2023-04-12 DIAGNOSIS — D692 Other nonthrombocytopenic purpura: Secondary | ICD-10-CM

## 2023-04-12 DIAGNOSIS — G300 Alzheimer's disease with early onset: Secondary | ICD-10-CM

## 2023-04-12 DIAGNOSIS — E782 Mixed hyperlipidemia: Secondary | ICD-10-CM

## 2023-04-12 DIAGNOSIS — Z0000 Encounter for general adult medical examination without abnormal findings: Secondary | ICD-10-CM

## 2023-04-12 DIAGNOSIS — H539 Unspecified visual disturbance: Secondary | ICD-10-CM

## 2023-04-12 DIAGNOSIS — M1000 Idiopathic gout, unspecified site: Secondary | ICD-10-CM

## 2023-04-12 DIAGNOSIS — E1140 Type 2 diabetes mellitus with diabetic neuropathy, unspecified: Secondary | ICD-10-CM

## 2023-04-12 DIAGNOSIS — R6889 Other general symptoms and signs: Secondary | ICD-10-CM

## 2023-04-12 DIAGNOSIS — E785 Hyperlipidemia, unspecified: Secondary | ICD-10-CM | POA: Diagnosis not present

## 2023-04-12 DIAGNOSIS — E1122 Type 2 diabetes mellitus with diabetic chronic kidney disease: Secondary | ICD-10-CM | POA: Diagnosis not present

## 2023-04-12 NOTE — Patient Instructions (Addendum)
Magnesium glycinate 400 mg once a day at bedtime   Leg Cramps Leg cramps occur when one or more muscles tighten and a person has no control over it (involuntary muscle contraction). Muscle cramps are most common in the calf muscles of the leg. They can occur during exercise or at rest. Leg cramps are painful, and they may last for a few seconds to a few minutes. Cramps may return several times before they finally stop. Usually, leg cramps are not caused by a serious medical problem. In many cases, the cause is not known. Some common causes include: Excessive physical effort (overexertion), such as during intense exercise. Doing the same motion over and over. Staying in a certain position for a long period of time. Improper preparation, form, or technique while doing a sport or an activity. Dehydration. Injury. Side effects of certain medicines. Abnormally low levels of minerals in your blood (electrolytes), especially potassium and calcium. This could result from: Pregnancy. Taking diuretic medicines. Follow these instructions at home: Eating and drinking Drink enough fluid to keep your urine pale yellow. Staying hydrated may help prevent cramps. Eat a healthy diet that includes plenty of nutrients to help your muscles function. A healthy diet includes fruits and vegetables, lean protein, whole grains, and low-fat or nonfat dairy products. Managing pain, stiffness, and swelling     Try massaging, stretching, and relaxing the affected muscle. Do this for several minutes at a time. If directed, put ice on areas that are sore or painful after a cramp. To do this: Put ice in a plastic bag. Place a towel between your skin and the bag. Leave the ice on for 20 minutes, 2-3 times a day. Remove the ice if your skin turns bright red. This is very important. If you cannot feel pain, heat, or cold, you have a greater risk of damage to the area. If directed, apply heat to muscles that are tense or  tight. Do this before you exercise, or as often as told by your health care provider. Use the heat source that your health care provider recommends, such as a moist heat pack or a heating pad. To do this: Place a towel between your skin and the heat source. Leave the heat on for 20-30 minutes. Remove the heat if your skin turns bright red. This is especially important if you are unable to feel pain, heat, or cold. You may have a greater risk of getting burned. Try taking hot showers or baths to help relax tight muscles. General instructions If you are having frequent leg cramps, avoid intense exercise for several days. Take over-the-counter and prescription medicines only as told by your health care provider. Keep all follow-up visits. This is important. Contact a health care provider if: Your leg cramps get more severe or more frequent, or they do not improve over time. Your foot becomes cold, numb, or blue. Summary Muscle cramps can develop in any muscle, but the most common place is in the calf muscles of the leg. Leg cramps are painful, and they may last for a few seconds to a few minutes. Usually, leg cramps are not caused by a serious medical problem. Often, the cause is not known. Stay hydrated, and take over-the-counter and prescription medicines only as told by your health care provider. This information is not intended to replace advice given to you by your health care provider. Make sure you discuss any questions you have with your health care provider. Document Revised: 10/23/2019 Document Reviewed: 10/23/2019  Elsevier Patient Education  2024 [employer].

## 2023-04-13 LAB — COMPLETE METABOLIC PANEL WITH GFR
AG Ratio: 2 (calc) (ref 1.0–2.5)
ALT: 31 U/L (ref 9–46)
AST: 23 U/L (ref 10–35)
Albumin: 4.5 g/dL (ref 3.6–5.1)
Alkaline phosphatase (APISO): 92 U/L (ref 35–144)
BUN/Creatinine Ratio: 19 (calc) (ref 6–22)
BUN: 29 mg/dL — ABNORMAL HIGH (ref 7–25)
CO2: 26 mmol/L (ref 20–32)
Calcium: 9.7 mg/dL (ref 8.6–10.3)
Chloride: 109 mmol/L (ref 98–110)
Creat: 1.56 mg/dL — ABNORMAL HIGH (ref 0.70–1.28)
Globulin: 2.2 g/dL (ref 1.9–3.7)
Glucose, Bld: 80 mg/dL (ref 65–99)
Potassium: 4.8 mmol/L (ref 3.5–5.3)
Sodium: 143 mmol/L (ref 135–146)
Total Bilirubin: 0.5 mg/dL (ref 0.2–1.2)
Total Protein: 6.7 g/dL (ref 6.1–8.1)
eGFR: 47 mL/min/{1.73_m2} — ABNORMAL LOW (ref >=60)

## 2023-04-13 LAB — CBC WITH DIFFERENTIAL/PLATELET
Absolute Lymphocytes: 1561 {cells}/uL (ref 850–3900)
Absolute Monocytes: 770 {cells}/uL (ref 200–950)
Basophils Absolute: 89 {cells}/uL (ref 0–200)
Basophils Relative: 1.2 %
Eosinophils Absolute: 252 {cells}/uL (ref 15–500)
Eosinophils Relative: 3.4 %
HCT: 44.2 % (ref 38.5–50.0)
Hemoglobin: 14.3 g/dL (ref 13.2–17.1)
MCH: 28 pg (ref 27.0–33.0)
MCHC: 32.4 g/dL (ref 32.0–36.0)
MCV: 86.5 fL (ref 80.0–100.0)
MPV: 9.4 fL (ref 7.5–12.5)
Monocytes Relative: 10.4 %
Neutro Abs: 4729 {cells}/uL (ref 1500–7800)
Neutrophils Relative %: 63.9 %
Platelets: 267 10*3/uL (ref 140–400)
RBC: 5.11 10*6/uL (ref 4.20–5.80)
RDW: 13.2 % (ref 11.0–15.0)
Total Lymphocyte: 21.1 %
WBC: 7.4 10*3/uL (ref 3.8–10.8)

## 2023-04-13 LAB — LIPID PANEL
Cholesterol: 169 mg/dL (ref <200)
HDL: 58 mg/dL (ref >=40)
LDL Cholesterol (Calc): 95 mg/dL
Non-HDL Cholesterol (Calc): 111 mg/dL (ref <130)
Total CHOL/HDL Ratio: 2.9 (calc) (ref <5.0)
Triglycerides: 73 mg/dL (ref <150)

## 2023-04-13 LAB — HEMOGLOBIN A1C W/OUT EAG: Hgb A1c MFr Bld: 7 %{Hb} — ABNORMAL HIGH (ref <5.7)

## 2023-05-03 DIAGNOSIS — H04563 Stenosis of bilateral lacrimal punctum: Secondary | ICD-10-CM | POA: Diagnosis not present

## 2023-05-03 DIAGNOSIS — H25813 Combined forms of age-related cataract, bilateral: Secondary | ICD-10-CM | POA: Diagnosis not present

## 2023-05-03 DIAGNOSIS — H3561 Retinal hemorrhage, right eye: Secondary | ICD-10-CM | POA: Diagnosis not present

## 2023-05-03 DIAGNOSIS — R7309 Other abnormal glucose: Secondary | ICD-10-CM | POA: Diagnosis not present

## 2023-05-05 DIAGNOSIS — M546 Pain in thoracic spine: Secondary | ICD-10-CM | POA: Diagnosis not present

## 2023-06-09 DIAGNOSIS — M545 Low back pain, unspecified: Secondary | ICD-10-CM | POA: Diagnosis not present

## 2023-07-03 ENCOUNTER — Other Ambulatory Visit: Payer: Self-pay | Admitting: Internal Medicine

## 2023-07-03 DIAGNOSIS — E1169 Type 2 diabetes mellitus with other specified complication: Secondary | ICD-10-CM

## 2023-07-08 ENCOUNTER — Other Ambulatory Visit: Payer: Self-pay | Admitting: Neurology

## 2023-07-10 NOTE — Telephone Encounter (Signed)
Rx refilled per last office visit note.

## 2023-07-13 ENCOUNTER — Ambulatory Visit: Payer: PPO | Admitting: Internal Medicine

## 2023-08-19 HISTORY — PX: CATARACT EXTRACTION: SUR2

## 2023-09-13 ENCOUNTER — Ambulatory Visit: Payer: PPO | Admitting: Neurology

## 2023-09-13 NOTE — Progress Notes (Deleted)
 [facility]    Provider:  Dr Lucia Gaskins Requesting Provider: Lucky Cowboy, MD Primary Care Provider:  Lucky Cowboy, MD  CC:  [facility]  03/15/2023: His memory is not worse, stable. His DAT scan is negative. The most bothersome is word-finding. If he gets agitated he gets tremulous. Mother has tremor. His tremor appears high freq, low amplitude. We discussed essential and treatment which he declines due to risk. He has become more feeble and wife states he does not need to go on ladders and I agreed. He states he has been hard on his body. He is just not used to getting older. We discussed CT amyloid scan due to his elevated Ab-42 in the brain but he has protective E3/E3 and no e4 gene. Wife provides much information. He has a lot of pain and is getting an MRI on Friday. No spatial disorientation, still taking care of bills and his mother's as well, he may be depressed due to taking care of his mother and his brother had a stroke and it gets to him, wife states he is bored and only working 2 days a week. Discussed SSRIs.   Patient complains of symptoms per HPI as well as the following symptoms: memory loss, stable, low back pain . Pertinent negatives and positives per HPI. All others negative   Update 03/01/2023: [name], Porfirio Mylar, MD  P Gna-Pod 3 Results Cc: Anson Fret, MD; Lucky Cowboy, MD The PSG revealed very fragmented sleep, surprisingly no snoring, apnea  and only few limb movements. Abnormal EMG tone during REM sleep and bradycardia are the only findings. EMG tone can be related to REM BD / seen in LBD and PD.  12/14/2022: Diagnosed with mild cognitive impairment. Discussed parkinson's disease vs alzheimer's disease. Discussed lecanemab. Dicussed DAT Scan or Amyloid Pet Scan.   No e4 gene:  E3/E3 genetic testing ATN showed: A low beta-amyloid 42/40 and a high pTau181 concentration were observed. A normal NfL concentration was observed at this time. These results  are consistent with the presence of Alzheimer's  related pathology. Spoke to wife and patint, decided to check for PD with DAT scan first and then if negative CT Amyloid scan for alzheimers.   Hgba1c 6 Ldl 122 CMP BUN 26 creat 1.56, AST 46 ALT 61 Cbc nml RPR NR TSH nml Scheduled for sleep study July 9th.  MRI brain 08/17/2022: reviewed images: FINDINGS:    No abnormal lesions are seen on diffusion-weighted views to suggest acute ischemia. The cortical sulci, fissures and cisterns are normal in size and appearance. Lateral, third and fourth ventricle are normal in size and appearance. No extra-axial fluid collections are seen. No evidence of mass effect or midline shift.  Mild periventricular and subcortical foci of nonspecific T2 hyperintensities.  No abnormal lesions are seen on post contrast views.     On sagittal views the posterior fossa, pituitary gland and corpus callosum are unremarkable. No evidence of intracranial hemorrhage on SWI views. The orbits and their contents, paranasal sinuses and calvarium are unremarkable.  Intracranial flow voids are present.     IMPRESSION:    MRI brain (with and without) demonstrating: - Mild periventricular and subcortical foci of nonspecific gliosis. - No acute findings.  Patient complains of symptoms per HPI as well as the following symptoms: none . Pertinent negatives and positives per HPI. All others negative   HPI:  [name] is a 73 y.o. male here as requested by Lucky Cowboy, MD for memory problems. Here with his  wife who also provides mch information. He has short-term memory problems. More communicating he having difficulty finding words. More short-term, can remember 25 years ago. Still driving, no accidents in the hone, still takes own medications, not missing any bills, wife is here and she reports much information and she says some people say he hasn;t been the same. Grandfather had dementia, dad passed away at 83, mom is 72 and no  memory problems, one brother 69 and no dementia. Snores significantly, nods off, morning headaches, stress. No other focal neurologic deficits, associated symptoms, inciting events or modifiable factors.   Reviewed notes, labs and imaging from outside physicians, which showed:  Hgba1c 6.6  Tsh nml   Review of Systems: Patient complains of symptoms per HPI as well as the following symptoms neuropathy in feet. Pertinent negatives and positives per HPI. All others negative.   Social History   Socioeconomic History   Marital status: Married    Spouse name: Not on file   Number of children: Not on file   Years of education: Not on file   Highest education level: Not on file  Occupational History   Not on file  Tobacco Use   Smoking status: Never   Smokeless tobacco: Never  Vaping Use   Vaping status: Never Used  Substance and Sexual Activity   Alcohol use: Not Currently    Comment: maybe 2-3 beers in a year   Drug use: No   Sexual activity: Not on file  Other Topics Concern   Not on file  Social History Narrative   Lives at home with wife   Right handed   Caffeine: 1 cup per day, tries to have decaf instead   Social Drivers of Corporate investment banker Strain: Not on file  Food Insecurity: Not on file  Transportation Needs: Not on file  Physical Activity: Not on file  Stress: Not on file  Social Connections: Not on file  Intimate Partner Violence: Not on file    Family History  Problem Relation Age of Onset   Heart disease Mother 10       stent   Heart disease Father    Kidney disease Father    Heart failure Father    Alzheimer's disease Paternal Grandfather    Colon cancer Neg Hx    Esophageal cancer Neg Hx    Liver cancer Neg Hx    Pancreatic cancer Neg Hx    Rectal cancer Neg Hx    Stomach cancer Neg Hx     Past Medical History:  Diagnosis Date   Allergy    Bone spur    Left Shoulder   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 (HCC) 07/18/2017    Fracture    right forearm   Gout    Hyperlipidemia    Hypertension    Other testicular hypofunction    PONV (postoperative nausea and vomiting)    Type II or unspecified type diabetes mellitus without mention of complication, not stated as uncontrolled     Patient Active Problem List   Diagnosis Date Noted   Chronic bilateral low back pain with right-sided sciatica 03/15/2023   Gait abnormality 03/15/2023   Parkinsonian features 09/06/2022   Snoring 09/06/2022   RLS (restless legs syndrome) 09/06/2022   MCI (mild cognitive impairment) 09/06/2022   Senile purpura (HCC) 01/08/2020   Statin myopathy 12/19/2019   Closed fracture of middle of right radius and ulna 07/16/2019   Diabetes, polyneuropathy (HCC) 12/17/2018  Statin intolerance 12/17/2018   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 (HCC) 07/18/2017   Vitamin D deficiency 08/29/2013   Medication management 08/29/2013   Hyperlipidemia associated with type 2 diabetes mellitus (HCC)    Hypertension    Type 2 diabetes mellitus with stage 3 chronic kidney disease (HCC)    Testosterone deficiency    Gout     Past Surgical History:  Procedure Laterality Date   CYST REMOVAL HAND     ORIF RADIAL FRACTURE Right 07/18/2019   Procedure: OPEN REDUCTION INTERNAL FIXATION (ORIF) RADIUS AND ULNA FRACTURE;  Surgeon: Roby Lofts, MD;  Location: [facility] OR;  Service: Orthopedics;  Laterality: Right;   PROSTATE BIOPSY  2010   WRIST SURGERY Right 1998    Current Outpatient Medications  Medication Sig Dispense Refill   ALPHA LIPOIC ACID PO Take by mouth.     aspirin EC 81 MG tablet Take 81 mg by mouth daily. Swallow whole.     Cholecalciferol (VITAMIN D3 PO) Take 5,000 Units by mouth daily.     Cyanocobalamin (VITAMIN B-12 PO) Take by mouth daily.     desoximetasone (TOPICORT) 0.25 % cream Apply 1 Application topically daily as needed (Skin rash). 30 g 1   donepezil (ARICEPT) 10 MG tablet TAKE 1 TABLET BY MOUTH AT BEDTIME 90 tablet  0   enalapril (VASOTEC) 10 MG tablet Take 1 tablet (10 mg total) by mouth daily. 90 tablet 3   ezetimibe (ZETIA) 10 MG tablet TAKE 1 TABLET BY MOUTH DAILY FOR CHOLESTEROL 90 tablet 3   Magnesium 250 MG TABS Take by mouth 2 (two) times daily.     No current facility-administered medications for this visit.    Allergies as of 09/13/2023 - Review Complete 04/12/2023  Allergen Reaction Noted   Crestor [rosuvastatin] Diarrhea 08/04/2013   Welchol [colesevelam hcl] Diarrhea 08/04/2013   Zoloft [sertraline hcl] Other (See Comments) 08/04/2013   Penicillins Other (See Comments) 08/04/2013    Vitals: There were no vitals taken for this visit. Last Weight:  Wt Readings from Last 1 Encounters:  04/12/23 153 lb 6.4 oz (69.6 kg)   Last Height:   Ht Readings from Last 1 Encounters:  04/12/23 5' 6.5" (1.689 m)      08/10/2022   10:48 AM  MMSE - Mini Mental State Exam  Orientation to time 4  Orientation to Place 4  Registration 3  Attention/ Calculation 1  Recall 1  Language- name 2 objects 2  Language- repeat 1  Language- follow 3 step command 3  Language- read & follow direction 1  Write a sentence 1  Copy design 0  Total score 21   I had a long d/w patient about her recent stroke, risk for recurrent stroke/TIAs, personally independently reviewed imaging studies and stroke evaluation results and answered questions.Continue Plavix  for secondary stroke prevention and maintain strict control of hypertension with blood pressure goal below 130/90, diabetes with hemoglobin A1c goal below 6.5% and lipids with LDL cholesterol goal below 70 mg/dL.Patient has h/o statin intolerance hence recommend new PCSK9 inhibitor like Praluent. Recommend she see her primary MD to get prescription. I also advised the patient to eat a healthy diet with plenty of whole grains, cereals, fruits and vegetables, exercise regularly and maintain ideal body weight .Followup in the future with me in  2 months or call  earlier if necessary.    Assessment/Plan:  Lovely patient who is extremely worried about his memory, mmse 21/30, unknown FHx of  dementia,PMHx DM with polyneuropathy, CKD, HLD, HTN. He describes imbalance and ataxia, he does have a gait impairment, he has swan neck posture, he has cramping, he has decreased ROM of the neck. Crepitus. Consider MRI cervical spine. He has a lumbar spine mri scheduled. Discussed PT.  They want to be followed and see.  Ataxia, gait impairment, chronic neck pain, parkinsonism, DAT is negative, need to evaluate for cervical myelopathy due to degenerative disk disease.   Diagnosed with mild cognitive impairment by formal memory testing. Discussed parkinson's disease vs alzheimer's disease. Discussed lecanemab. Dicussed DAT Scan or Amyloid Pet Scan. Spoke to wife and patint, decided to check for PD with DAT scan first and then if negative CT Amyloid scan for alzheimers: DAT Scan negative, "No reduced radiotracer activity in basal ganglia to suggest Parkinson's syndrome pathology."  No e4 gene:  E3/E3 genetic testing ATN showed: A low beta-amyloid 42/40 and a high pTau181 concentration were observed. A normal NfL concentration was observed at this time. These results are consistent with the presence of Alzheimer's  related pathology. Spoke to wife and patint, decided to check for PD with DAT scan first and then if negative CT Amyloid scan for alzheimers.   DAT scan was negative. Discussed CT Amyloid scan and new medications(lecanemab) as well as old medication. Donepezil has helped. Discussed watching bradycardia with donepezil. he may be depressed due to taking care of his mother and his brother had a stroke and it gets to him, wife states he is bored and only working 2 days a week. Discussed SSRIs.   Hgba1c 6 Ldl 122 CMP BUN 26 creat 1.56, AST 46 ALT 61 Cbc nml RPR NR TSH nml Scheduled for sleep study July 9th.  MRI brain 08/17/2022: reviewed images: IMPRESSION:    MRI  brain (with and without) demonstrating: - Mild periventricular and subcortical foci of nonspecific gliosis. - No acute findings.  check for Patkinson's disease with DAT scan first and then if this test is negative will order...  order CT Amyloid scan for alzheimers.   Diagnosed with mild cognitive impairment (read below on MCI) unclear if parkinson's disease or precursor to Alzheimer's disease. Increase aricept 10mg .   Sent telephone note to my team to call patient's wife: Please call wife. 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 We decided(patient here and wife on phne) to start with a scan to look for parkinson's disease(DAT Scan) and if negative go to a CT brain amyloid for alzheimer's. Also he has sleep study July 9th. In the meantime I increased his Aricept to 10mg .  We should be able to get this all completed within a few months. I'd like wife to be at te next appointment to discuss results and what we can do to treat him. Probably September sometime. Can you ask wife if she has a time in September maybe mid September to come to an appointment with patient and schedule it please? You can review the above.   I spent over 30 minutes of face-to-face and non-face-to-face time with patient on the  No diagnosis found.  diagnosis.  This included previsit chart review, lab review, study review, order entry, electronic health record documentation, patient education on the different diagnostic and therapeutic options, counseling and coordination of care, risks and benefits of management, compliance, or risk factor reduction   Cc: Lucky Cowboy, MD,  Lucky Cowboy, MD  Naomie Dean, MD  [facility] [id number]  [place], [place] [address]  Phone [phone] Fax [phone]  I spent over 30 minutes of face-to-face and non-face-to-face time with patient on the  No diagnosis found.     diagnosis.  This included previsit chart review, lab review, study review, order entry, electronic health record documentation, patient education on the different diagnostic and therapeutic options, counseling and coordination of care, risks and benefits of management, compliance, or risk factor reduction

## 2023-09-14 ENCOUNTER — Telehealth: Payer: Self-pay | Admitting: Neurology

## 2023-09-14 NOTE — Telephone Encounter (Signed)
 Pt's wife called wanting pt to be seen sooner that scheduled appt. There was no cx at the time she called. Please advise.

## 2023-09-15 NOTE — Telephone Encounter (Signed)
 Unable to LVM.

## 2023-09-19 NOTE — Telephone Encounter (Signed)
 I called wife of pt.  She states that pt is going down hill relating to his memory. He came in day after his appt was scheduled.  So is rescheduled to 10-12-2023.  Driving and memory an issue. Feels like he may depressed.  I recommended to see pcp for evaluation to make sure not any underlying infection if acute change. I recommended that if concern of hurting himself or anyone else to call 911. She was at work and had to get off the phone.  She said would keep appt.

## 2023-09-19 NOTE — Telephone Encounter (Signed)
 Called wife's #. Could not leave message VM full.

## 2023-09-28 DIAGNOSIS — R413 Other amnesia: Secondary | ICD-10-CM | POA: Insufficient documentation

## 2023-09-28 DIAGNOSIS — R4701 Aphasia: Secondary | ICD-10-CM | POA: Insufficient documentation

## 2023-09-28 DIAGNOSIS — G259 Extrapyramidal and movement disorder, unspecified: Secondary | ICD-10-CM | POA: Insufficient documentation

## 2023-09-28 DIAGNOSIS — G629 Polyneuropathy, unspecified: Secondary | ICD-10-CM | POA: Insufficient documentation

## 2023-09-28 DIAGNOSIS — R251 Tremor, unspecified: Secondary | ICD-10-CM | POA: Insufficient documentation

## 2023-10-12 ENCOUNTER — Telehealth: Payer: Self-pay | Admitting: Neurology

## 2023-10-12 ENCOUNTER — Ambulatory Visit: Admitting: Neurology

## 2023-10-12 ENCOUNTER — Ambulatory Visit: Payer: PPO | Admitting: Internal Medicine

## 2023-10-12 ENCOUNTER — Ambulatory Visit: Payer: PPO | Admitting: Nurse Practitioner

## 2023-10-12 ENCOUNTER — Encounter: Payer: Self-pay | Admitting: Neurology

## 2023-10-12 VITALS — BP 150/75 | HR 63

## 2023-10-12 DIAGNOSIS — R269 Unspecified abnormalities of gait and mobility: Secondary | ICD-10-CM

## 2023-10-12 DIAGNOSIS — R296 Repeated falls: Secondary | ICD-10-CM | POA: Diagnosis not present

## 2023-10-12 DIAGNOSIS — M545 Low back pain, unspecified: Secondary | ICD-10-CM | POA: Diagnosis not present

## 2023-10-12 DIAGNOSIS — E1142 Type 2 diabetes mellitus with diabetic polyneuropathy: Secondary | ICD-10-CM

## 2023-10-12 DIAGNOSIS — G8929 Other chronic pain: Secondary | ICD-10-CM

## 2023-10-12 DIAGNOSIS — G3184 Mild cognitive impairment, so stated: Secondary | ICD-10-CM

## 2023-10-12 MED ORDER — GABAPENTIN 300 MG PO CAPS: 300.0000 mg | ORAL_CAPSULE | Freq: Two times a day (BID) | ORAL | 4 refills | Status: DC

## 2023-10-12 MED ORDER — DONEPEZIL HCL 5 MG PO TABS: 5.0000 mg | ORAL_TABLET | Freq: Every morning | ORAL | 11 refills | Status: DC

## 2023-10-12 MED ORDER — MEMANTINE HCL 10 MG PO TABS: ORAL_TABLET | ORAL | 3 refills | Status: DC

## 2023-10-12 NOTE — Telephone Encounter (Signed)
 Referral for physical therapy fax to [facility]. Phone: [phone], Fax: [phone]

## 2023-10-12 NOTE — Patient Instructions (Addendum)
 -Continue Aricept  at 5mg  in the morning (caused nightmares) -Can start Namenda  -DAT scan was negative but you do have some parkinsonian like symptoms/signs such as increased tone in upper extremities, soft voice, slowing down and walking we need to monitor, exercise can slow down progression -Advised physical therapy and a walking aid : For gait abnormality and low back pain and falls:[facility] [address] [place], [place] [address]. Phone: [phone] -Follow up with dr dumonski's office for the low back pain and compression fracture -Tremor : gabapentin  100mg -300 bid (can consider worse primidone but can cause drowsiness and risk of falls) - depression: consider Zoloft   Meds ordered this encounter  Medications   donepezil  (ARICEPT ) 5 MG tablet    Sig: Take 1 tablet (5 mg total) by mouth every morning.    Dispense:  30 tablet    Refill:  11   memantine  (NAMENDA ) 10 MG tablet    Sig: Start with 10mg  at bedtime. In 2 weeks, if no side effects, can increase to 1 pill twice daily.    Dispense:  180 tablet    Refill:  3   gabapentin  (NEURONTIN ) 300 MG capsule    Sig: Take 1 capsule (300 mg total) by mouth 2 (two) times daily. For tremor and neuropathic pain    Dispense:  180 capsule    Refill:  4   Orders Placed This Encounter  Procedures   Ambulatory referral to Physical Therapy     Memantine  Tablets What is this medication? MEMANTINE  (MEM an teen) treats memory loss and confusion (dementia) in people who have Alzheimer disease. It works by improving attention, memory, and the ability to engage in daily activities. It is not a cure for dementia or Alzheimer disease. This medicine may be used for other purposes; ask your health care provider or pharmacist if you have questions. COMMON BRAND NAME(S): Namenda  What should I tell my care team before I take this medication? They need to know if you have any of these conditions: Kidney disease Liver  disease Seizures Trouble passing urine An unusual or allergic reaction to memantine , other medications, foods, dyes, or preservatives Pregnant or trying to get pregnant Breast-feeding How should I use this medication? Take this medication by mouth with water. Follow the directions on the prescription label. You may take this medication with or without food. Take your doses at regular intervals. Do not take your medication more often than directed. Continue to take your medication even if you feel better. Do not stop taking except on the advice of your care team. Talk to your care team about the use of this medication in children. Special care may be needed Overdosage: If you think you have taken too much of this medicine contact a poison control center or emergency room at once. NOTE: This medicine is only for you. Do not share this medicine with others. What if I miss a dose? If you miss a dose, take it as soon as you can. If it is almost time for your next dose, take only that dose. Do not take double or extra doses. If you do not take your medication for several days, contact your care team. Your dose may need to be changed. What may interact with this medication? Acetazolamide Amantadine Cimetidine Dextromethorphan Dofetilide Hydrochlorothiazide  Ketamine Metformin  Methazolamide Quinidine Ranitidine Sodium bicarbonate Triamterene This list may not describe all possible interactions. Give your health care provider a list of all the medicines, herbs, non-prescription drugs, or dietary  supplements you use. Also tell them if you smoke, drink alcohol, or use illegal drugs. Some items may interact with your medicine. What should I watch for while using this medication? Visit your care team for regular checks on your progress. Check with your care team if there is no improvement in your symptoms or if they get worse. This medication may affect your coordination, reaction time, or judgment.  Do not drive or operate machinery until you know how this medication affects you. Sit up or stand slowly to reduce the risk of dizzy or fainting spells. Drinking alcohol with this medication can increase the risk of these side effects. What side effects may I notice from receiving this medication? Side effects that you should report to your care team as soon as possible: Allergic reactions--skin rash, itching, hives, swelling of the face, lips, tongue, or throat Side effects that usually do not require medical attention (report to your care team if they continue or are bothersome): Confusion Constipation Diarrhea Dizziness Headache This list may not describe all possible side effects. Call your doctor for medical advice about side effects. You may report side effects to FDA at 1-800-FDA-1088. Where should I keep my medication? Keep out of the reach of children. Store at room temperature between 15 degrees and 30 degrees C (59 degrees and 86 degrees F). Throw away any unused medication after the expiration date. NOTE: This sheet is a summary. It may not cover all possible information. If you have questions about this medicine, talk to your doctor, pharmacist, or health care provider.  2024 Elsevier/Gold Standard (2021-06-29 00:00:00)

## 2023-10-12 NOTE — Progress Notes (Signed)
 [facility]    Provider:  Dr Tresia Fruit Requesting Provider: Vangie Genet, MD Primary Care Provider:  Vangie Genet, MD  CC:  MCI  10/12/2023: Referred to Dr. Jackee Marus for his orthopaedic issues neck and low back pain see imaging results below. Here for memory follow up. He is on Aricept . 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 DAT scan negative for parkinsonian disorder. Sleep study negative for OSA but some increased tone in REM. Patient is here with his wife for follow-up. He feels his condition is worse. He feels his memory, balance, and tremors is worse. Pt's wife states her father takes Primidone and it helps so she wants to talk to Dr Tresia Fruit about it. He has had several falls since September. He had a fracture in his back. He has fallen down the steps, can't get out a boat alone anymore, etc. He feels like he has to hold onto something if steps are involved. Wife believes his neuropathy may affect his walking. Feels his memory is worsening. The tremor is worsening. They deny depression. Taking Aricept  in the morning still getting bad dreams go back to 5mg .    03/17/2023: IMPRESSION: MRI lumbar 1. Acute superior endplate compression deformity at T12. No significant retropulsion or evidence of epidural blood products. 2. Multilevel degenerative changes of the lumbar spine as described above.  03/17/2023: IMPRESSION: MRI lumbar 1. No acute abnormality of the cervical spine. 2. Multilevel degenerative changes of the cervical spine with severe right neural foraminal narrowing at C3-C4 and moderate right neural foraminal narrowing at C4-C5. 3. Mild spinal canal narrowing at C6-C7.  DAT Scan 01/2023: IMPRESSION: Ioflupane scan within normal limits. No reduced radiotracer activity in basal ganglia to suggest Parkinson's syndrome pathology  03/15/2023: His memory is not worse, stable. His DAT scan is negative. The most bothersome is word-finding. If he gets agitated he gets tremulous.  Mother has tremor. His tremor appears high freq, low amplitude. We discussed essential and treatment which he declines due to risk. He has become more feeble and wife states he does not need to go on ladders and I agreed. He states he has been hard on his body. He is just not used to getting older. We discussed CT amyloid scan due to his elevated Ab-42 in the brain but he has protective E3/E3 and no e4 gene. Wife provides much information. He has a lot of pain and is getting an MRI on Friday. No spatial disorientation, still taking care of bills and his mother's as well, he may be depressed due to taking care of his mother and his brother had a stroke and it gets to him, wife states he is bored and only working 2 days a week. Discussed SSRIs.    Patient complains of symptoms per HPI as well as the following symptoms: memory loss, stable, low back pain . Pertinent negatives and positives per HPI. All others negative   Update 03/01/2023: [name], Raoul Byes, MD  P Gna-Pod 3 Results Cc: Glory Larsen, MD; Vangie Genet, MD The PSG revealed very fragmented sleep, surprisingly no snoring, apnea  and only few limb movements. Abnormal EMG tone during REM sleep and bradycardia are the only findings. EMG tone can be related to REM BD / seen in LBD and PD.  12/14/2022: Diagnosed with mild cognitive impairment. Discussed parkinson's disease vs alzheimer's disease. Discussed lecanemab. Dicussed DAT Scan or Amyloid Pet Scan.   No e4 gene:  E3/E3 genetic testing ATN showed: A low beta-amyloid 42/40 and a high pTau181 concentration were observed.  A normal NfL concentration was observed at this time. These results are consistent with the presence of Alzheimer's  related pathology. Spoke to wife and patint, decided to check for PD with DAT scan first and then if negative CT Amyloid scan for alzheimers.   Hgba1c 6 Ldl 122 CMP BUN 26 creat 1.56, AST 46 ALT 61 Cbc nml RPR NR TSH nml Scheduled for sleep study July  9th.  MRI brain 08/17/2022: reviewed images: FINDINGS:    No abnormal lesions are seen on diffusion-weighted views to suggest acute ischemia. The cortical sulci, fissures and cisterns are normal in size and appearance. Lateral, third and fourth ventricle are normal in size and appearance. No extra-axial fluid collections are seen. No evidence of mass effect or midline shift.  Mild periventricular and subcortical foci of nonspecific T2 hyperintensities.  No abnormal lesions are seen on post contrast views.     On sagittal views the posterior fossa, pituitary gland and corpus callosum are unremarkable. No evidence of intracranial hemorrhage on SWI views. The orbits and their contents, paranasal sinuses and calvarium are unremarkable.  Intracranial flow voids are present.     IMPRESSION:    MRI brain (with and without) demonstrating: - Mild periventricular and subcortical foci of nonspecific gliosis. - No acute findings.  Patient complains of symptoms per HPI as well as the following symptoms: none . Pertinent negatives and positives per HPI. All others negative   HPI:  [name] is a 73 y.o. male here as requested by Vangie Genet, MD for memory problems. Here with his wife who also provides mch information. He has short-term memory problems. More communicating he having difficulty finding words. More short-term, can remember 25 years ago. Still driving, no accidents in the hone, still takes own medications, not missing any bills, wife is here and she reports much information and she says some people say he hasn;t been the same. Grandfather had dementia, dad passed away at 22, mom is 72 and no memory problems, one brother 89 and no dementia. Snores significantly, nods off, morning headaches, stress. No other focal neurologic deficits, associated symptoms, inciting events or modifiable factors.   Reviewed notes, labs and imaging from outside physicians, which showed:  Hgba1c 6.6  Tsh  nml   Review of Systems: Patient complains of symptoms per HPI as well as the following symptoms neuropathy in feet. Pertinent negatives and positives per HPI. All others negative.   Social History   Socioeconomic History   Marital status: Married    Spouse name: Not on file   Number of children: Not on file   Years of education: Not on file   Highest education level: Not on file  Occupational History   Not on file  Tobacco Use   Smoking status: Never   Smokeless tobacco: Never  Vaping Use   Vaping status: Never Used  Substance and Sexual Activity   Alcohol use: Not Currently    Comment: maybe 2-3 beers in a year   Drug use: No   Sexual activity: Not on file  Other Topics Concern   Not on file  Social History Narrative   Lives at home with wife   Right handed   Caffeine: none currently    Social Drivers of Health   Financial Resource Strain: Low Risk  (09/28/2023)   Received from [facility]   Overall Financial Resource Strain (CARDIA)    Difficulty of Paying Living Expenses: Not hard at all  Food Insecurity: No Food Insecurity (09/28/2023)  Received from [facility] Vital Sign    Worried About Running Out of Food in the Last Year: Never true    Ran Out of Food in the Last Year: Never true  Transportation Needs: No Transportation Needs (09/28/2023)   Received from [facility] - Transportation    Lack of Transportation (Medical): No    Lack of Transportation (Non-Medical): No  Physical Activity: Not on file  Stress: Not on file  Social Connections: Not on file  Intimate Partner Violence: Not on file    Family History  Problem Relation Age of Onset   Heart disease Mother 57       stent   Heart disease Father    Kidney disease Father    Heart failure Father    Alzheimer's disease Paternal Grandfather    Colon cancer Neg Hx    Esophageal cancer Neg Hx    Liver cancer Neg Hx    Pancreatic cancer Neg Hx    Rectal cancer Neg Hx     Stomach cancer Neg Hx     Past Medical History:  Diagnosis Date   Allergy    Bone spur    Left Shoulder   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 (HCC) 07/18/2017   Fracture    right forearm   Gout    Hyperlipidemia    Hypertension    Other testicular hypofunction    PONV (postoperative nausea and vomiting)    Type II or unspecified type diabetes mellitus without mention of complication, not stated as uncontrolled     Patient Active Problem List   Diagnosis Date Noted   Chronic bilateral low back pain with right-sided sciatica 03/15/2023   Gait abnormality 03/15/2023   Parkinsonian features 09/06/2022   Snoring 09/06/2022   RLS (restless legs syndrome) 09/06/2022   MCI (mild cognitive impairment) 09/06/2022   Senile purpura (HCC) 01/08/2020   Statin myopathy 12/19/2019   Closed fracture of middle of right radius and ulna 07/16/2019   Diabetes, polyneuropathy (HCC) 12/17/2018   Statin intolerance 12/17/2018   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 (HCC) 07/18/2017   Vitamin D  deficiency 08/29/2013   Medication management 08/29/2013   Hyperlipidemia associated with type 2 diabetes mellitus (HCC)    Hypertension    Type 2 diabetes mellitus with stage 3 chronic kidney disease (HCC)    Testosterone  deficiency    Gout     Past Surgical History:  Procedure Laterality Date   CATARACT EXTRACTION Left 08/2023   CYST REMOVAL HAND     ORIF RADIAL FRACTURE Right 07/18/2019   Procedure: OPEN REDUCTION INTERNAL FIXATION (ORIF) RADIUS AND ULNA FRACTURE;  Surgeon: Laneta Pintos, MD;  Location: [facility] OR;  Service: Orthopedics;  Laterality: Right;   PROSTATE BIOPSY  2010   WRIST SURGERY Right 1998    Current Outpatient Medications  Medication Sig Dispense Refill   ALPHA LIPOIC ACID PO Take by mouth.     aspirin EC 81 MG tablet Take 81 mg by mouth daily. Swallow whole.     Cholecalciferol (VITAMIN D3 PO) Take 5,000 Units by mouth daily.     Cyanocobalamin  (VITAMIN B-12 PO)  Take by mouth daily.     desoximetasone  (TOPICORT ) 0.25 % cream Apply 1 Application topically daily as needed (Skin rash). 30 g 1   donepezil  (ARICEPT ) 10 MG tablet TAKE 1 TABLET BY MOUTH AT BEDTIME (Patient taking differently: Take 10 mg by mouth daily.) 90  tablet 0   donepezil  (ARICEPT ) 5 MG tablet Take 1 tablet (5 mg total) by mouth every morning. 30 tablet 11   enalapril  (VASOTEC ) 10 MG tablet Take 1 tablet (10 mg total) by mouth daily. 90 tablet 3   ezetimibe  (ZETIA ) 10 MG tablet TAKE 1 TABLET BY MOUTH DAILY FOR CHOLESTEROL 90 tablet 3   gabapentin  (NEURONTIN ) 300 MG capsule Take 1 capsule (300 mg total) by mouth 2 (two) times daily. For tremor and neuropathic pain 180 capsule 4   Magnesium 250 MG TABS Take by mouth 2 (two) times daily.     memantine  (NAMENDA ) 10 MG tablet Start with 10mg  at bedtime. In 2 weeks, if no side effects, can increase to 1 pill twice daily. 180 tablet 3   No current facility-administered medications for this visit.    Allergies as of 10/12/2023 - Review Complete 10/12/2023  Allergen Reaction Noted   Crestor [rosuvastatin] Diarrhea 08/04/2013   Welchol [colesevelam hcl] Diarrhea 08/04/2013   Zoloft [sertraline hcl] Other (See Comments) 08/04/2013   Penicillins Other (See Comments) 08/04/2013    Vitals: BP (!) 150/75 (BP Location: Right Arm, Patient Position: Sitting, Cuff Size: Normal)   Pulse 63  Last Weight:  Wt Readings from Last 1 Encounters:  04/12/23 153 lb 6.4 oz (69.6 kg)   Last Height:   Ht Readings from Last 1 Encounters:  04/12/23 5' 6.5" (1.689 m)   Exam: NAD, pleasant, anhedonic          Speech:    Speech is normal; fluent and spontaneous with normal comprehension.  Cognition:     10/12/2023    8:27 AM 08/10/2022   10:48 AM  MMSE - Mini Mental State Exam  Orientation to time 3 4  Orientation to Place 2 4  Registration 3 3  Attention/ Calculation 1 1  Recall 0 1  Language- name 2 objects 2 2  Language- repeat 0 1  Language-  follow 3 step command 3 3  Language- read & follow direction 1 1  Write a sentence 1 1  Copy design 1 0  Total score 17 21     Cranial Nerves: Some mild hypomimia    The pupils are equal, round, and reactive to light.Trigeminal sensation is intact and the muscles of mastication are normal. The face is symmetric. The palate elevates in the midline. Hearing intact. Voice is hypophonic. Shoulder shrug is normal. The tongue has normal motion without fasciculations.   Coordination:  No dysmetria  Motor Observation:    No asymmetry, no atrophy, and no involuntary movements noted. Tone:    Normal muscle tone.     Strength:    Strength is V/V in the upper and lower limbs.      Sensation: intact to LT   Tone: cogwheeling int he uppers  Gait: stooped, low clearance    Assessment/Plan:  Assessment/Plan:  Lovely patient who is extremely worried about his memory, mmse 21/30 now 17/30, unknown FHx of dementia,PMHx DM with polyneuropathy, CKD, HLD, HTN. Referred to Dr. Jackee Marus for his orthopaedic issues neck and low back pain see imaging results. Here for memory follow up. He is on Aricept . 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 DAT scan negative for parkinsonian disorder. Sleep study negative for OSA but some increased tone in REM.Lab testing without E4 but A low beta-amyloid 42/40 and a high pTau181 concentration were observed. A normal NfL concentration was observed at this time. These results are consistent with the presence of Alzheimer's  related pathology. Diagnosed with mild cognitive  impairment (read below on MCI) unclear if parkinson's disease or precursor to Alzheimer's disease.    Patient plan:  -Continue Aricept  at 5mg  in the morning (caused nightmares) -Can start Namenda  -DAT scan was negative but you do have some parkinsonian like symptoms/signs such as increased tone in upper extremities, soft voice, slowing down and walking we need to monitor, exercise can slow down progression -Advised physical  therapy and a walking aid : For gait abnormality and low back pain and falls:[facility] [address] [place], [place] [address]. Phone: [phone] -Follow up with dr dumonski's office for the low back pain and compression fracture -Tremor : gabapentin  100mg -300 bid (can consider worse primidone but can cause drowsiness and risk of falls) - depression: consider Zoloft   Tremor: Can try Gabapentin , primidone at bedtime can cause sedation, DAT for parkinson's was negative and MRI cervical spine did not show myelopathy gait imbalance may be due to dementia or neuropathy in feet or lumbar spine disease needs walking aids, fall precautions, possible PT for gait and discussed. Based on creatine 03/2023 1.56 41ml/min (can give 200-700mg  bid for CrCl 30-59) has taken 300mg  in the past and did well can try 300mg  twice daily for tremor and neuropathy and also has some cervical radiculopathy   Despite DAT scan being negtaive he does have parkinsonian signs and symptoms like cogwheeling in the arms, shuffling, soft voice. Possibly variant despite negative DAT scan. Discussed exercise to slow down parkinson's disease.   Continue Aricept , can start memantine .   For gait abnormality and low back pain and falls:[facility] [address] [place], [place] [address]. Phone: [phone]   Diagnosed with mild cognitive impairment by formal memory testing. Discussed parkinson's disease vs alzheimer's disease. Discussed lecanemab. Dicussed DAT Scan or Amyloid Pet Scan. Spoke to wife and patint, decided to check for PD with DAT scan first and then if negative CT Amyloid scan for alzheimers: DAT Scan negative, "No reduced radiotracer activity in basal ganglia to suggest Parkinson's syndrome pathology."   No e4 gene:  E3/E3 genetic testing ATN showed: A low beta-amyloid 42/40 and a high pTau181 concentration were observed. A normal NfL concentration was observed at this  time. These results are consistent with the presence of Alzheimer's  related pathology. Spoke to wife and patint, decided to check for PD with DAT scan first and then if negative CT Amyloid scan for alzheimers.    DAT scan was negative. Discussed CT Amyloid scan and new medications(lecanemab) as well as old medication. Donepezil  has helped. Discussed watching bradycardia with donepezil . he may be depressed due to taking care of his mother and his brother had a stroke and it gets to him, wife states he is bored and only working 2 days a week. Discussed SSRIs.   Meds ordered this encounter  Medications   donepezil  (ARICEPT ) 5 MG tablet    Sig: Take 1 tablet (5 mg total) by mouth every morning.    Dispense:  30 tablet    Refill:  11   memantine  (NAMENDA ) 10 MG tablet    Sig: Start with 10mg  at bedtime. In 2 weeks, if no side effects, can increase to 1 pill twice daily.    Dispense:  180 tablet    Refill:  3   gabapentin  (NEURONTIN ) 300 MG capsule    Sig: Take 1 capsule (300 mg total) by mouth 2 (two) times daily. For tremor and neuropathic pain    Dispense:  180 capsule  Refill:  4   Orders Placed This Encounter  Procedures   Ambulatory referral to Physical Therapy      Hgba1c 6  Ldl 122 CMP BUN 26 creat 1.56, AST 46 ALT 61 Cbc nml RPR NR TSH nml   MRI brain 08/17/2022: reviewed images: IMPRESSION:    MRI brain (with and without) demonstrating: - Mild periventricular and subcortical foci of nonspecific gliosis. - No acute findings.   03/17/2023: IMPRESSION: MRI lumbar 1. Acute superior endplate compression deformity at T12. No significant retropulsion or evidence of epidural blood products. 2. Multilevel degenerative changes of the lumbar spine as described above.  03/17/2023: IMPRESSION: MRI lumbar 1. No acute abnormality of the cervical spine. 2. Multilevel degenerative changes of the cervical spine with severe right neural foraminal narrowing at C3-C4 and moderate right  neural foraminal narrowing at C4-C5. 3. Mild spinal canal narrowing at C6-C7.  DAT Scan 01/2023: IMPRESSION: Ioflupane scan within normal limits. No reduced radiotracer activity in basal ganglia to suggest Parkinson's syndrome pathology     Cc: Vangie Genet, MD,  Vangie Genet, MD  Aldona Amel, MD  [facility] [address] [place], [place] [address]  Phone [phone] Fax [phone]  I spent over 50 minutes of face-to-face and non-face-to-face time with patient on the  1. MCI (mild cognitive impairment)   2. Gait abnormality   3. Falls   4. Chronic bilateral low back pain, unspecified whether sciatica present   5. Diabetic polyneuropathy associated with type 2 diabetes mellitus (HCC)      diagnosis.  This included previsit chart review, lab review, study review, order entry, electronic health record documentation, patient education on the different diagnostic and therapeutic options, counseling and coordination of care, risks and benefits of management, compliance, or risk factor reduction

## 2023-11-10 ENCOUNTER — Ambulatory Visit: Admitting: Family Medicine

## 2023-11-10 ENCOUNTER — Telehealth: Payer: Self-pay | Admitting: Family Medicine

## 2023-11-10 NOTE — Telephone Encounter (Signed)
 Patient missed his new patient appointment today; stated he thought it was at 12:30pm. He arrived 12:15pm (outside of 10 minute grace period) and was rescheduled for 12/06/23. He's also on the waiting list in case a sooner appointment becomes available.  Previous provider Dr.  Cassondra Cliff is deceased. Patient stated Dr. Cassondra Cliff was the only provider at that office and no other provide is giving courtesy refills in his stead.   Patient stated he will need at least two meds refilled prior to next appointment and is requesting for this office to honor the request if he's unable to receive a sooner appointment. Patient doesn't know which of the 14 meds on his list will be due.  Pharmacy confirmed as     Please advise patient at [phone].

## 2023-12-06 ENCOUNTER — Encounter: Payer: Self-pay | Admitting: Family Medicine

## 2023-12-06 ENCOUNTER — Ambulatory Visit (INDEPENDENT_AMBULATORY_CARE_PROVIDER_SITE_OTHER): Admitting: Family Medicine

## 2023-12-06 VITALS — BP 126/78 | HR 55 | Temp 98.6°F | Ht 66.5 in | Wt 162.2 lb

## 2023-12-06 DIAGNOSIS — E1169 Type 2 diabetes mellitus with other specified complication: Secondary | ICD-10-CM | POA: Diagnosis not present

## 2023-12-06 DIAGNOSIS — G3184 Mild cognitive impairment, so stated: Secondary | ICD-10-CM | POA: Diagnosis not present

## 2023-12-06 DIAGNOSIS — N183 Chronic kidney disease, stage 3 unspecified: Secondary | ICD-10-CM

## 2023-12-06 DIAGNOSIS — E1122 Type 2 diabetes mellitus with diabetic chronic kidney disease: Secondary | ICD-10-CM

## 2023-12-06 DIAGNOSIS — I10 Essential (primary) hypertension: Secondary | ICD-10-CM | POA: Diagnosis not present

## 2023-12-06 DIAGNOSIS — R3589 Other polyuria: Secondary | ICD-10-CM

## 2023-12-06 DIAGNOSIS — Z23 Encounter for immunization: Secondary | ICD-10-CM

## 2023-12-06 DIAGNOSIS — E785 Hyperlipidemia, unspecified: Secondary | ICD-10-CM

## 2023-12-06 DIAGNOSIS — N1832 Chronic kidney disease, stage 3b: Secondary | ICD-10-CM

## 2023-12-06 NOTE — Progress Notes (Signed)
 Patient Office Visit  Assessment & Plan:  Primary hypertension -     CBC with Differential/Platelet -     Comprehensive metabolic panel with GFR  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 (HCC) -     Comprehensive metabolic panel with GFR  Hyperlipidemia associated with type 2 diabetes mellitus (HCC) -     Lipid panel  MCI (mild cognitive impairment)  Polyuria -     Urinalysis, Routine w reflex microscopic -     Urine Culture; Future  Type 2 diabetes mellitus with stage 3b chronic kidney disease, without long-term current use of insulin  (HCC) -     Hemoglobin A1c -     Vitamin B12  Need for Tdap vaccination -     Tdap vaccine greater than or equal to 7yo IM   Assessment and Plan    Increased Urinary Frequency Differential includes BPH and possible UTI. - Order urinalysis for UTI. - Order blood tests for kidney function.  Memory Impairment/MCI Condition stabilized with Aricept  and Namenda . Under neurologist care. - Continue Aricept  and Namenda . - Follow up with neurologist for MRI results.  Tremors Tested negative for Parkinson's disease. Managed with gabapentin . - Continue gabapentin  for tremors and pain.  Neuropathy Managed with gabapentin  for neuropathic pain. - Continue gabapentin .  Diabetes Mellitus Controlled with diet. Blood sugar levels around 130 mg/dL. - Continue dietary management. - Monitor blood sugar levels.  General Health Maintenance Up to date with pneumonia vaccine, overdue for tetanus shot. - Administer tetanus vaccine if covered by insurance. - Encourage physical activity and healthy lifestyle.      Test results were reviewed and analyzed as part of the medical decision making of this visit.  Previous notes from Dr. Cassondra Cliff internal medicine.  Reviewed previous labs done in October.  Reviewed imaging and neurology notes during the office visit.  Tdap updated today. Patient was unable to give us  a urine sample today.  Recommend  healthy diet i.e mediterranean/DASH diet, consistent exercise - 30 minutes 5 day per week, and gradual weight loss. Return in about 3 months (around 03/07/2024), or if symptoms worsen or fail to improve.   Subjective:    Patient ID: [name], male    DOB: 1951-05-04  Age: 73 y.o. MRN: [id number]  Chief Complaint  Patient presents with   Medical Management of Chronic Issues   Establish Care    HPI Discussed the use of AI scribe software for clinical note transcription with the patient, who gave verbal consent to proceed.  History of Present Illness        [name] is a 73 year old male who presents  to discuss chronic medical issues with increased urination and memory concerns. Also here to establish primary care (previous MD passed away) and discuss chronic medical issues.   He has experienced increased frequency of urination over the past 6 to 12 months, with symptoms worsening over time. He experiences urgency, needing to urinate immediately upon waking and again shortly after. He wakes once at night to urinate. No fever, chills, or history of urinary tract infections. He has been trying to drink more water and has quit coffee, but still drinks diet beverages and unsweetened tea. He limits fluid intake before bedtime but drinks some water with his medication before sleep.  He has a history of memory issues and is currently taking Aricept  and Namenda . There is no improvement in memory but notes stability in his condition. He experiences difficulty with short-term  memory and word-finding, stating it may take him a minute or two to recall names or information.  He has been experiencing tremors and was tested for Parkinson's disease, which was negative. He was prescribed gabapentin  in April and has experienced tremors and back pain following a fall. He reports improvement in his back pain but notes it worsens with lifting over 25 pounds or bending.  He has a history of type 2 diabetes,  which he manages with diet. His blood sugar levels are consistently around 120-130. He denies taking medication for diabetes and was previously told he was borderline diabetic. Last A1C 7.0 Hypertension- taking BP meds and BP is stable.  Hyperlipidemia- patient taking cholesterol medication without side effects.  He has a history of neuropathy, for which gabapentin  is also prescribed. The medication helps with his symptoms.  He lives in [place] and remains active, working two days a week and walking frequently. He cannot sit still and engages in various activities to stay active. Physical Exam Results LABS Blood glucose: 130 mg/dL (91/4782)  DIAGNOSTIC Sleep study: Normal Assessment & Plan Increased Urinary Frequency Differential includes BPH and possible UTI. - Order urinalysis for UTI. - Order blood tests for kidney function.  Memory Impairment/MCI Condition stabilized with Aricept  and Namenda . Under neurologist care. - Continue Aricept  and Namenda . - Follow up with neurologist for MRI results.  Tremors Tested negative for Parkinson's disease. Managed with gabapentin . - Continue gabapentin  for tremors and pain.  Neuropathy Managed with gabapentin  for neuropathic pain. - Continue gabapentin .  Type 2 Diabetes Mellitus Controlled with diet. Blood sugar levels around 120-130 mg/dL. - Continue dietary management. - Monitor blood sugar levels.  General Health Maintenance Up to date with pneumonia vaccine, overdue for tetanus shot. - Administer tetanus vaccine if covered by insurance. - Encourage physical activity and healthy lifestyle.    The 10-year ASCVD risk score (Arnett DK, et al., 2019) is: 38.7%  Past Medical History:  Diagnosis Date   Allergy    Bone spur    Left Shoulder   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 (HCC) 07/18/2017   Fracture    right forearm   Gout    Hyperlipidemia    Hypertension    Other testicular hypofunction    PONV  (postoperative nausea and vomiting)    Type II or unspecified type diabetes mellitus without mention of complication, not stated as uncontrolled    Past Surgical History:  Procedure Laterality Date   CATARACT EXTRACTION Left 08/2023   CYST REMOVAL HAND     ORIF RADIAL FRACTURE Right 07/18/2019   Procedure: OPEN REDUCTION INTERNAL FIXATION (ORIF) RADIUS AND ULNA FRACTURE;  Surgeon: Laneta Pintos, MD;  Location: [facility] OR;  Service: Orthopedics;  Laterality: Right;   PROSTATE BIOPSY  2010   WRIST SURGERY Right 1998   Social History   Tobacco Use   Smoking status: Never   Smokeless tobacco: Never  Vaping Use   Vaping status: Never Used  Substance Use Topics   Alcohol use: Not Currently    Comment: maybe 2-3 beers in a year   Drug use: No   Family History  Problem Relation Age of Onset   Heart disease Mother 53       stent   Heart disease Father    Kidney disease Father    Heart failure Father    Alzheimer's disease Paternal Grandfather    Colon cancer Neg Hx    Esophageal cancer Neg Hx  Liver cancer Neg Hx    Pancreatic cancer Neg Hx    Rectal cancer Neg Hx    Stomach cancer Neg Hx    Allergies  Allergen Reactions   Crestor [Rosuvastatin] Diarrhea   Welchol [Colesevelam Hcl] Diarrhea   Zoloft [Sertraline Hcl] Other (See Comments)    unknown   Penicillins Other (See Comments)    ROS    Objective:    BP 126/78   Pulse (!) 55   Temp 98.6 F (37 C)   Ht 5' 6.5 (1.689 m)   Wt 162 lb 4 oz (73.6 kg)   SpO2 98%   BMI 25.80 kg/m  BP Readings from Last 3 Encounters:  12/06/23 126/78  10/12/23 (!) 150/75  04/12/23 138/62   Wt Readings from Last 3 Encounters:  12/06/23 162 lb 4 oz (73.6 kg)  04/12/23 153 lb 6.4 oz (69.6 kg)  03/15/23 152 lb (68.9 kg)    Physical Exam Vitals and nursing note reviewed.  Constitutional:      General: He is not in acute distress.    Appearance: Normal appearance.     Comments: Patient comes in on his own  HENT:     Head:  Normocephalic.     Right Ear: Tympanic membrane, ear canal and external ear normal.     Left Ear: Tympanic membrane, ear canal and external ear normal.   Eyes:     Extraocular Movements: Extraocular movements intact.     Pupils: Pupils are equal, round, and reactive to light.    Cardiovascular:     Rate and Rhythm: Normal rate and regular rhythm.     Heart sounds: Normal heart sounds.  Pulmonary:     Effort: Pulmonary effort is normal.     Breath sounds: Normal breath sounds.   Musculoskeletal:     Right lower leg: No edema.     Left lower leg: No edema.   Neurological:     General: No focal deficit present.     Mental Status: He is alert.     Comments: Patient has difficulty with word finding.   Psychiatric:        Mood and Affect: Mood normal.        Behavior: Behavior normal.      No results found for any visits on 12/06/23.

## 2023-12-07 LAB — CBC WITH DIFFERENTIAL/PLATELET
Absolute Lymphocytes: 1530 {cells}/uL (ref 850–3900)
Basophils Absolute: 61 {cells}/uL (ref 0–200)
Basophils Relative: 0.9 %
Eosinophils Absolute: 333 {cells}/uL (ref 15–500)
Eosinophils Relative: 4.9 %
Hemoglobin: 15 g/dL (ref 13.2–17.1)
MCHC: 32.6 g/dL (ref 32.0–36.0)

## 2023-12-07 LAB — URINE CULTURE
MICRO NUMBER:: 16596121
Result:: NO GROWTH
SPECIMEN QUALITY:: ADEQUATE

## 2023-12-07 LAB — COMPREHENSIVE METABOLIC PANEL WITH GFR
Calcium: 9.8 mg/dL (ref 8.6–10.3)
Total Bilirubin: 0.8 mg/dL (ref 0.2–1.2)
Total Protein: 6.8 g/dL (ref 6.1–8.1)
eGFR: 44 mL/min/{1.73_m2} — ABNORMAL LOW (ref >=60)

## 2023-12-07 LAB — VITAMIN B12: Vitamin B-12: 2000 pg/mL — ABNORMAL HIGH (ref 200–1100)

## 2023-12-07 LAB — URINALYSIS, ROUTINE W REFLEX MICROSCOPIC
Bilirubin Urine: NEGATIVE
Glucose, UA: NEGATIVE
Hgb urine dipstick: NEGATIVE
Ketones, ur: NEGATIVE
Leukocytes,Ua: NEGATIVE
Nitrite: NEGATIVE
Protein, ur: NEGATIVE
Specific Gravity, Urine: 1.016 (ref 1.001–1.035)
pH: 7.5 (ref 5.0–8.0)

## 2023-12-07 LAB — HEMOGLOBIN A1C: Mean Plasma Glucose: 134 mg/dL

## 2023-12-08 ENCOUNTER — Ambulatory Visit: Payer: Self-pay | Admitting: Family Medicine

## 2023-12-11 NOTE — Progress Notes (Signed)
Added on PSA

## 2023-12-12 LAB — CBC WITH DIFFERENTIAL/PLATELET
Absolute Monocytes: 605 {cells}/uL (ref 200–950)
HCT: 46 % (ref 38.5–50.0)
MCH: 28.6 pg (ref 27.0–33.0)
MCV: 87.6 fL (ref 80.0–100.0)
MPV: 9.7 fL (ref 7.5–12.5)
Monocytes Relative: 8.9 %
Neutro Abs: 4270 {cells}/uL (ref 1500–7800)
Neutrophils Relative %: 62.8 %
Platelets: 246 10*3/uL (ref 140–400)
RBC: 5.25 10*6/uL (ref 4.20–5.80)
RDW: 12.7 % (ref 11.0–15.0)
Total Lymphocyte: 22.5 %
WBC: 6.8 10*3/uL (ref 3.8–10.8)

## 2023-12-12 LAB — PSA: PSA: 1.94 ng/mL (ref <=4.00)

## 2023-12-12 LAB — LIPID PANEL
Cholesterol: 161 mg/dL (ref <200)
HDL: 45 mg/dL (ref >=40)
LDL Cholesterol (Calc): 94 mg/dL
Non-HDL Cholesterol (Calc): 116 mg/dL (ref <130)
Total CHOL/HDL Ratio: 3.6 (calc) (ref <5.0)
Triglycerides: 127 mg/dL (ref <150)

## 2023-12-12 LAB — TEST AUTHORIZATION

## 2023-12-12 LAB — COMPREHENSIVE METABOLIC PANEL WITH GFR
AG Ratio: 2.1 (calc) (ref 1.0–2.5)
ALT: 28 U/L (ref 9–46)
AST: 29 U/L (ref 10–35)
Albumin: 4.6 g/dL (ref 3.6–5.1)
Alkaline phosphatase (APISO): 62 U/L (ref 35–144)
BUN/Creatinine Ratio: 16 (calc) (ref 6–22)
BUN: 26 mg/dL — ABNORMAL HIGH (ref 7–25)
CO2: 26 mmol/L (ref 20–32)
Chloride: 106 mmol/L (ref 98–110)
Creat: 1.64 mg/dL — ABNORMAL HIGH (ref 0.70–1.28)
Globulin: 2.2 g/dL (ref 1.9–3.7)
Glucose, Bld: 99 mg/dL (ref 65–99)
Potassium: 4.6 mmol/L (ref 3.5–5.3)
Sodium: 141 mmol/L (ref 135–146)

## 2023-12-12 LAB — HEMOGLOBIN A1C
Hgb A1c MFr Bld: 6.3 % — ABNORMAL HIGH (ref <5.7)
eAG (mmol/L): 7.4 mmol/L

## 2023-12-21 ENCOUNTER — Telehealth: Payer: Self-pay | Admitting: Family Medicine

## 2023-12-21 DIAGNOSIS — I10 Essential (primary) hypertension: Secondary | ICD-10-CM

## 2023-12-21 MED ORDER — ENALAPRIL MALEATE 10 MG PO TABS
10.0000 mg | ORAL_TABLET | Freq: Every day | ORAL | 1 refills | Status: DC
Start: 2023-12-21 — End: 2024-05-03

## 2023-12-21 NOTE — Telephone Encounter (Signed)
 Copied from CRM [id number]. Topic: Clinical - Medication Refill >> Dec 21, 2023  9:40 AM Willma R wrote: Medication: enalapril  (VASOTEC ) 10 MG tablet  Has the patient contacted their pharmacy? Yes, call dr  This is the patient's preferred pharmacy:  [facility] [id number] - [place], [place] - [address] [place] [address] Phone: [phone] Fax: [phone]  Is this the correct pharmacy for this prescription? Yes If no, delete pharmacy and type the correct one.   Has the prescription been filled recently? No  Is the patient out of the medication? Yes  Has the patient been seen for an appointment in the last year OR does the patient have an upcoming appointment? Yes  Can we respond through MyChart? No  Agent: Please be advised that Rx refills may take up to 3 business days. We ask that you follow-up with your pharmacy.

## 2023-12-28 ENCOUNTER — Ambulatory Visit: Admitting: Family Medicine

## 2024-01-09 ENCOUNTER — Telehealth: Payer: Self-pay | Admitting: Neurology

## 2024-01-09 NOTE — Telephone Encounter (Signed)
 Unable to reach pt using both phone numbers in chart, VM boxes full. Sent text msg informing pt of need to reschedule 01/11/24 appt - MD out   If patient calls back you can offer an appointment on 01/24/24 with Dr. Ines

## 2024-01-11 ENCOUNTER — Ambulatory Visit: Admitting: Neurology

## 2024-01-24 ENCOUNTER — Ambulatory Visit: Admitting: Neurology

## 2024-01-24 ENCOUNTER — Encounter: Payer: Self-pay | Admitting: Neurology

## 2024-01-24 VITALS — BP 156/74 | HR 59 | Ht 66.5 in | Wt 165.4 lb

## 2024-01-24 DIAGNOSIS — G8929 Other chronic pain: Secondary | ICD-10-CM | POA: Diagnosis not present

## 2024-01-24 DIAGNOSIS — E1142 Type 2 diabetes mellitus with diabetic polyneuropathy: Secondary | ICD-10-CM | POA: Diagnosis not present

## 2024-01-24 DIAGNOSIS — G3184 Mild cognitive impairment, so stated: Secondary | ICD-10-CM | POA: Diagnosis not present

## 2024-01-24 DIAGNOSIS — R251 Tremor, unspecified: Secondary | ICD-10-CM | POA: Diagnosis not present

## 2024-01-24 DIAGNOSIS — M5441 Lumbago with sciatica, right side: Secondary | ICD-10-CM

## 2024-01-24 MED ORDER — MEMANTINE HCL ER 28 MG PO CP24: 28.0000 mg | ORAL_CAPSULE | Freq: Every day | ORAL | 4 refills | Status: DC

## 2024-01-24 MED ORDER — GABAPENTIN 400 MG PO CAPS: 400.0000 mg | ORAL_CAPSULE | Freq: Two times a day (BID) | ORAL | 4 refills | Status: DC

## 2024-01-24 NOTE — Progress Notes (Signed)
 [facility]    Stephenson:  Dr [name]: Wayne Fallow, MD Primary Care Stephenson:  No primary care Stephenson on file.  CC:  [facility]  01/24/2024: Here for follow up for tremr, mild cognitive impairment, diabetic polyneuropathy. They take aricept  and namenda  in the morning. The Namenda  in the past caused side effects. Will change namenda  to once a day in the morning. He states he can't remember anything, but overall he is stable. He went to [place], [place] where [facility] is and he walked for a week, very active, no falls, good apetite is improved, no swallowing problems,his back still hurts if he picks up something his back hurts immediately, he has not followed up with his back doctor. His wife is hilarious, she is here today and is lovely. They have a new primary care physician [name] and like her very much. They check b12 and hgba1c >2000 and 6.3 which is good control of diabetes, ckd is stable. He takes gabapentin  for tremors, not helping, sleep apnea test was negative, takes the gabapentin  twice a day discussed increasing it but they prefer to try primidone at bedtime because she is familiar with the medication and with action the tremor can be significant, however primidone is metabolized renally and with CKD will increase gebapantin whoch is also metabolized through the kidneys but may affect him less. Neuropathy is stable, diabetic. Sleep study normal. Wife of 15 years. Discussed new medications for alzheimers and they prefer not to start, to manage, he is stable. No depression, he has a good sense of humor. Lives in [place]. No other focal neurologic deficits, associated symptoms, inciting events or modifiable factors.Patient complains of symptoms per HPI as well as the following symptoms: none . Pertinent negatives and positives per HPI. All others negative    10/12/2023: Referred to Dr. Beuford for his orthopaedic issues neck and low back pain see  imaging results below. Here for memory follow up. He is on Aricept . 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 DAT scan negative for parkinsonian disorder. Sleep study negative for OSA but some increased tone in REM. Patient is here with his wife for follow-up. He feels his condition is worse. He feels his memory, balance, and tremors is worse. Pt's wife states her father takes Primidone and it helps so she wants to talk to Dr Wayne about it. He has had several falls since September. He had a fracture in his back. He has fallen down the steps, can't get out a boat alone anymore, etc. He feels like he has to hold onto something if steps are involved. Wife believes his neuropathy may affect his walking. Feels his memory is worsening. The tremor is worsening. They deny depression. Taking Aricept  in the morning still getting bad dreams go back to 5mg .    03/17/2023: IMPRESSION: MRI lumbar 1. Acute superior endplate compression deformity at T12. No significant retropulsion or evidence of epidural blood products. 2. Multilevel degenerative changes of the lumbar spine as described above.  03/17/2023: IMPRESSION: MRI lumbar 1. No acute abnormality of the cervical spine. 2. Multilevel degenerative changes of the cervical spine with severe right neural foraminal narrowing at C3-C4 and moderate right neural foraminal narrowing at C4-C5. 3. Mild spinal canal narrowing at C6-C7.  DAT Scan 01/2023: IMPRESSION: Ioflupane scan within normal limits. No reduced radiotracer activity in basal ganglia to suggest Parkinson's syndrome pathology  03/15/2023: His memory is not worse, stable. His DAT scan is negative. The most bothersome is word-finding. If he gets agitated he  gets tremulous. Mother has tremor. His tremor appears high freq, low amplitude. We discussed essential and treatment which he declines due to risk. He has become more feeble and wife states he does not need to go on ladders and I agreed. He states he has been hard on his  body. He is just not used to getting older. We discussed CT amyloid scan due to his elevated Ab-42 in the brain but he has protective E3/E3 and no e4 gene. Wife provides much information. He has a lot of pain and is getting an MRI on Friday. No spatial disorientation, still taking care of bills and his mother's as well, he may be depressed due to taking care of his mother and his brother had a stroke and it gets to him, wife states he is bored and only working 2 days a week. Discussed SSRIs.    Patient complains of symptoms per HPI as well as the following symptoms: memory loss, stable, low back pain . Pertinent negatives and positives per HPI. All others negative   Update 03/01/2023: [name], [name], MD  P Gna-Pod 3 Results Cc: Wayne Onetha NOVAK, MD; Wayne Fallow, MD The PSG revealed very fragmented sleep, surprisingly no snoring, apnea  and only few limb movements. Abnormal EMG tone during REM sleep and bradycardia are the only findings. EMG tone can be related to REM BD / seen in LBD and PD.  12/14/2022: Diagnosed with mild cognitive impairment. Discussed parkinson's disease vs alzheimer's disease. Discussed lecanemab. Dicussed DAT Scan or Amyloid Pet Scan.   No e4 gene:  E3/E3 genetic testing ATN showed: A low beta-amyloid 42/40 and a high pTau181 concentration were observed. A normal NfL concentration was observed at this time. These results are consistent with the presence of Alzheimer's  related pathology. Spoke to wife and patint, decided to check for PD with DAT scan first and then if negative CT Amyloid scan for alzheimers.   Hgba1c 6 Ldl 122 CMP BUN 26 creat 1.56, AST 46 ALT 61 Cbc nml RPR NR TSH nml Scheduled for sleep study July 9th.  MRI brain 08/17/2022: reviewed images: FINDINGS:    No abnormal lesions are seen on diffusion-weighted views to suggest acute ischemia. The cortical sulci, fissures and cisterns are normal in size and appearance. Lateral, third and fourth ventricle  are normal in size and appearance. No extra-axial fluid collections are seen. No evidence of mass effect or midline shift.  Mild periventricular and subcortical foci of nonspecific T2 hyperintensities.  No abnormal lesions are seen on post contrast views.     On sagittal views the posterior fossa, pituitary gland and corpus callosum are unremarkable. No evidence of intracranial hemorrhage on SWI views. The orbits and their contents, paranasal sinuses and calvarium are unremarkable.  Intracranial flow voids are present.     IMPRESSION:    MRI brain (with and without) demonstrating: - Mild periventricular and subcortical foci of nonspecific gliosis. - No acute findings.  Patient complains of symptoms per HPI as well as the following symptoms: none . Pertinent negatives and positives per HPI. All others negative   HPI:  [name] is a 73 y.o. male here as requested by Wayne Fallow, MD for memory problems. Here with his wife who also provides mch information. He has short-term memory problems. More communicating he having difficulty finding words. More short-term, can remember 25 years ago. Still driving, no accidents in the hone, still takes own medications, not missing any bills, wife is here and she reports much  information and she says some people say he hasn;t been the same. Grandfather had dementia, dad passed away at 64, mom is 2 and no memory problems, one brother 29 and no dementia. Snores significantly, nods off, morning headaches, stress. No other focal neurologic deficits, associated symptoms, inciting events or modifiable factors.   Reviewed notes, labs and imaging from outside physicians, which showed:  Hgba1c 6.6  Tsh nml   Review of Systems: Patient complains of symptoms per HPI as well as the following symptoms neuropathy in feet. Pertinent negatives and positives per HPI. All others negative.   Social History   Socioeconomic History   Marital status: Married     Spouse name: Not on file   Number of children: Not on file   Years of education: Not on file   Highest education level: Not on file  Occupational History   Not on file  Tobacco Use   Smoking status: Never   Smokeless tobacco: Never  Vaping Use   Vaping status: Never Used  Substance and Sexual Activity   Alcohol use: Not Currently    Comment: maybe 2-3 beers in a year   Drug use: No   Sexual activity: Not on file  Other Topics Concern   Not on file  Social History Narrative   Lives at home with wife   Right handed   Caffeine: none currently    Social Drivers of Health   Financial Resource Strain: Low Risk  (09/28/2023)   Received from [facility]   Overall Financial Resource Strain (CARDIA)    Difficulty of Paying Living Expenses: Not hard at all  Food Insecurity: No Food Insecurity (09/28/2023)   [facility] from [facility]   Hunger Vital Sign    Within the past 12 months, you worried that your food would run out before you got the money to buy more.: Never true    Within the past 12 months, the food you bought just didn't last and you didn't have money to get more.: Never true  Transportation Needs: No Transportation Needs (09/28/2023)   Received from [facility] - [facility]    Lack of Transportation (Medical): No    Lack of Transportation (Non-Medical): No  Physical Activity: Not on file  Stress: Not on file  Social Connections: Not on file  Intimate Partner Violence: Not on file    Family History  Problem Relation Age of Onset   Heart disease Mother 74       stent   Heart disease Father    Kidney disease Father    Heart failure Father    Alzheimer's disease Paternal Grandfather    Colon cancer Neg Hx    Esophageal cancer Neg Hx    Liver cancer Neg Hx    Pancreatic cancer Neg Hx    Rectal cancer Neg Hx    Stomach cancer Neg Hx     Past Medical History:  Diagnosis Date   Allergy    Bone spur    Left Shoulder   CKD stage 3 due to type  2 diabetes mellitus (HCC) 07/18/2017   Fracture    right forearm   Gout    Hyperlipidemia    Hypertension    Other testicular hypofunction    PONV (postoperative nausea and vomiting)    Type II or unspecified type diabetes mellitus without mention of complication, not stated as uncontrolled     Patient Active Problem List   Diagnosis Date Noted   Aphasia 09/28/2023  Memory loss 09/28/2023   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   Neuropathy 09/28/2023   Tremor 09/28/2023   Chronic bilateral low back pain with right-sided sciatica 03/15/2023   Gait abnormality 03/15/2023   Mild neurocognitive disorder 11/04/2022   Parkinsonian features 09/06/2022   RLS (restless legs syndrome) 09/06/2022   MCI (mild cognitive impairment) 09/06/2022   Senile purpura (HCC) 01/08/2020   Statin myopathy 12/19/2019   Diabetes, polyneuropathy (HCC) 12/17/2018   Statin intolerance 12/17/2018   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 (HCC) 07/18/2017   Vitamin D  deficiency 08/29/2013   Medication management 08/29/2013   Hyperlipidemia associated with type 2 diabetes mellitus (HCC)    Hypertension    Type 2 diabetes mellitus with stage 3 chronic kidney disease (HCC)    Testosterone  deficiency    Gout     Past Surgical History:  Procedure Laterality Date   CATARACT EXTRACTION Left 08/2023   CYST REMOVAL HAND     ORIF RADIAL FRACTURE Right 07/18/2019   Procedure: OPEN REDUCTION INTERNAL FIXATION (ORIF) RADIUS AND ULNA FRACTURE;  Surgeon: Kendal Franky SQUIBB, MD;  Location: [facility] OR;  Service: Orthopedics;  Laterality: Right;   PROSTATE BIOPSY  2010   WRIST SURGERY Right 1998    Current Outpatient Medications  Medication Sig Dispense Refill   ALPHA LIPOIC ACID PO Take by mouth.     aspirin EC 81 MG tablet Take 81 mg by mouth daily. Swallow whole.     Cholecalciferol (VITAMIN D3 PO) Take 5,000 Units by mouth daily.     Cyanocobalamin  (VITAMIN B-12 PO) Take by mouth daily.     desoximetasone  (TOPICORT )  0.25 % cream Apply 1 Application topically daily as needed (Skin rash). 30 g 1   donepezil  (ARICEPT ) 10 MG tablet TAKE 1 TABLET BY MOUTH AT BEDTIME 90 tablet 0   enalapril  (VASOTEC ) 10 MG tablet Take 1 tablet (10 mg total) by mouth daily. 90 tablet 1   ezetimibe  (ZETIA ) 10 MG tablet TAKE 1 TABLET BY MOUTH DAILY FOR CHOLESTEROL 90 tablet 3   Magnesium 250 MG TABS Take by mouth 2 (two) times daily.     memantine  (NAMENDA  XR) 28 MG CP24 24 hr capsule Take 1 capsule (28 mg total) by mouth daily. 90 capsule 4   gabapentin  (NEURONTIN ) 400 MG capsule Take 1 capsule (400 mg total) by mouth 2 (two) times daily. For tremor and neuropathic pain 180 capsule 4   No current facility-administered medications for this visit.    Allergies as of 01/24/2024 - Review Complete 01/24/2024  Allergen Reaction Noted   Crestor [rosuvastatin] Diarrhea 08/04/2013   Welchol [colesevelam hcl] Diarrhea 08/04/2013   Zoloft [sertraline hcl] Other (See Comments) 08/04/2013   Penicillins Other (See Comments) 08/04/2013    Vitals: BP (!) 156/74 (Cuff Size: Normal)   Pulse (!) 59   Ht 5' 6.5 (1.689 m)   Wt 165 lb 6.4 oz (75 kg)   BMI 26.30 kg/m  Last Weight:  Wt Readings from Last 1 Encounters:  01/24/24 165 lb 6.4 oz (75 kg)   Last Height:   Ht Readings from Last 1 Encounters:  01/24/24 5' 6.5 (1.689 m)   Physical exam: Exam: Gen: NAD, conversant      CV: No palpitations or chest pain or SOB. VS: Breathing at a normal rate. Not febrile. Eyes: Conjunctivae clear without exudates or hemorrhage  Neuro: Detailed Neurologic Exam  Speech:    Speech is normal; fluent and spontaneous with normal comprehension.  Cognition:  01/24/2024    9:34 AM 10/12/2023    8:27 AM 08/10/2022   10:48 AM  MMSE - Mini Mental State Exam  Orientation to time 4 3 4   Orientation to Place 4 2 4   Registration 3 3 3   Attention/ Calculation 1 1 1   Recall 1 0 1  Language- name 2 objects 2 2 2   Language- repeat 1 0 1   Language- follow 3 step command 3 3 3   Language- read & follow direction 1 1 1   Write a sentence 1 1 1   Copy design 1 1 0  Total score 22 17 21     Cranial Nerves:    The pupils are equal, round, and reactive to light. Visual fields are full Extraocular movements are intact.  The face is symmetric with normal sensation. The palate elevates in the midline. Hearing intact. Voice is normal. Shoulder shrug is normal. The tongue has normal motion without fasciculations.   Coordination: normal  Gait:    No abnormalities noted or reported  Motor Observation:   no involuntary movements noted. Tone: Mild cogwheeling in the uppers  Posture:    Posture is normal. normal erect    Strength:    Strength is anti-gravity and symmetric in the upper and lower limbs.      Sensation: intact to LT, no reports of numbness or tingling or paresthesias         Assessment/Plan:  Assessment/Plan:  Lovely patient who is extremely worried about his memory, mmse 21/30 Feb 2024 today 22/30 stable, unknown FHx of dementia,PMHx DM with polyneuropathy, CKD, HLD, HTN. Diagnosed with mild cognitive impairment by formal memory testing.   Sleep study negative for OSA but some increased tone in REM.  Lab testing without E4 but A low beta-amyloid 42/40 and a high pTau181 concentration were observed. A normal NfL concentration was observed at this time. These results are consistent with the presence of Alzheimer's  related pathology. Diagnosed with mild cognitive impairment (read below on MCI) unclear if parkinson's disease or precursor to Alzheimer's disease.  Continue Aricept  at 5mg  in the morning (caused nightmares) Continue Namenda  but will change to extended release due to side effects with IR DAT scan was negative but you do have some parkinsonian like symptoms/signs such as increased tone in upper extremities, soft voice, slowing down and walking we need to monitor, exercise can slow down progression Advised  physical therapy and a walking aid : For gait abnormality and low back pain and falls:[facility] [address] [place], [place] [address]. Phone: [phone] Follow up with dr dumonski's office for the low back pain and compression fracture Tremor : gabapentin  100mg -300 bid (can consider worse primidone but can cause drowsiness and risk of falls)  depression: consider Zoloft   under good control of DM (hgba1c 6.3) and B12   Diagnosed with mild cognitive impairment by formal memory testing. Discussed parkinson's disease vs alzheimer's disease. Discussed lecanemab/donanemab, they decline at this time.     Meds ordered this encounter  Medications   memantine  (NAMENDA  XR) 28 MG CP24 24 hr capsule    Sig: Take 1 capsule (28 mg total) by mouth daily.    Dispense:  90 capsule    Refill:  4   gabapentin  (NEURONTIN ) 400 MG capsule    Sig: Take 1 capsule (400 mg total) by mouth 2 (two) times daily. For tremor and neuropathic pain    Dispense:  180 capsule    Refill:  4  No orders of the defined types were placed in this encounter.     Cc: Rafaela Aletha Onetha Epp, MD  [facility] [address] [place], [place] [address]  Phone [phone] Fax [phone]  I spent of face-to-face and non-face-to-face time with patient on the  1. Mild neurocognitive disorder   2. Diabetic polyneuropathy associated with type 2 diabetes mellitus (HCC)   3. Chronic bilateral low back pain with right-sided sciatica   4. Tremor       diagnosis.  This included previsit chart review, lab review, study review, order entry, electronic health record documentation, patient education on the different diagnostic and therapeutic options, counseling and coordination of care, risks and benefits of management, compliance, or risk factor reduction

## 2024-01-24 NOTE — Patient Instructions (Addendum)
 Stop Namenda (Memantine ) 10mg  in the morning and start Namenda (Memantine  XR) 28mg  in the morning. Stop Gabapentin  300mg  twice daily and start gabapentin  400mg  twice daily  Problems With Thinking and Memory (Mild Neurocognitive Disorder): What to Know Mild neurocognitive disorder, formerly known as mild cognitive impairment, is a disorder where your memory doesn't work as well as it should. It may also affect other mental abilities like thinking, communicating, behavior, and being able to finish tasks. These problems can be noticed and measured. But they usually don't stop you from doing daily activities or living on your own. Mild neurocognitive disorder usually happens after 73 years of age. But it can also happen at younger ages. It's not as serious as major neurocognitive disorder, also known as dementia, but it may be the first sign of it. In general, the symptoms of this condition get worse over time. In rare cases, symptoms can get better. What are the causes? This condition may be caused by: Brain disorders like Alzheimer's disease, Parkinson's disease, and other conditions that slowly damage nerve cells. Diseases that affect the blood vessels in the brain and cause small strokes. Certain infections, like HIV. Traumatic brain injury. Other medical conditions, such as brain tumors, underactive thyroid  (hypothyroidism), and not having enough vitamin B12. Using certain drugs or medicines. What increases the risk? Being older than 73 years of age. Being male. Having a lower level of education. Diabetes, high blood pressure, high cholesterol, and other conditions that raise the risk for blood vessel diseases. Untreated or undertreated sleep apnea. Having a certain type of gene that can be inherited, or passed down from parent to child. Long-term health problems like heart disease, lung disease, liver disease, kidney disease, or depression. What are the signs or symptoms? Trouble remembering  things. You may: Forget names, phone numbers, or details of recent events. Forget about social events and appointments. Often forget where you put your car keys or other items. Trouble thinking and solving problems. You may have trouble with complex tasks like: Paying bills. Driving in places you don't know well. Trouble communicating. You may have trouble: Finding the right word or naming an object. Forming a sentence that makes sense. Understanding what you read or hear. Changes in your behavior or personality. When this happens, you may: Lose interest in the things you used to enjoy. Avoid being around people. Get angry more easily than usual. Act before thinking. How is this diagnosed? This condition is diagnosed based on: Your symptoms. Your health care provider may ask you and the people you spend time with, like family and friends, about your symptoms. Memory tests and other tests to check how your brain is working. Your provider may refer you to a provider called a neurologist or a mental health specialist. To try to find out the cause of your condition, your provider may: Get a detailed medical history. Ask about use of alcohol, drugs, and medicines. Do a physical exam. Order blood tests and brain imaging tests. How is this treated? Mild neurocognitive disorder that's caused by medicine use, drug use, infection, or another medical condition may get better when the cause is treated, or when medicines or drugs are stopped. If this disorder has another cause, it usually doesn't improve and may get worse. In these cases, the goal of treatment is to help you manage the symptoms. This may include: Medicines to help with memory and behavior symptoms. Talk therapy. This provides education, emotional support, memory aids, and other ways of making up for problems  with mental tasks. Lifestyle changes. These may include: Getting regular exercise. Eating a healthy diet that includes  omega-3 fatty acids. Doing things to challenge your thinking and memory skills. Spending more time being with and talking to other people. Using routines like having regular times for meals and going to bed. Follow these instructions at home: Eating and drinking  Drink more fluids as told. Eat a healthy diet that includes omega-3 fatty acids. These can be found in: Fish. Nuts. Leafy vegetables. Vegetable oils. If you drink alcohol: Limit how much you have to: 0-1 drink a day if you're male. 0-2 drinks a day if you're male. Know how much alcohol is in your drink. In the [place] one drink is one 12 oz bottle of beer (355 mL), one 5 oz glass of wine (148 mL), or one 1 oz glass of hard liquor (44 mL). Lifestyle  Get regular exercise as told by your provider. Do not smoke, vape, or use nicotine or tobacco. Use healthy ways to manage stress. If you need help managing stress, ask your provider. Keep spending time with other people. Keep your mind active by doing activities you enjoy, like reading or playing games. Make sure you get good sleep at night. These tips can help: Try not to take naps during the day. Keep your bedroom dark and cool. Do not exercise in the few hours before you go to bed. Do not have foods or drinks with caffeine at night. General instructions Take medicines only as told. Your provider may tell you to avoid taking medicines that can affect thinking. These include some medicines for pain or sleeping. Work with your provider to find out: What things you need help with. What your safety needs are. Where to find more information [employer] on Aging: [email or web address] Contact a health care provider if: You have any new symptoms. Get help right away if: You have new confusion or your confusion gets worse. You act in ways that put you or your family in danger. This information is not intended to replace advice given to you by your health care provider. Make  sure you discuss any questions you have with your health care provider. Document Revised: 11/29/2022 Document Reviewed: 11/29/2022 Elsevier Patient Education  2024 [employer].

## 2024-01-26 ENCOUNTER — Encounter: Payer: PPO | Admitting: Internal Medicine

## 2024-03-01 NOTE — Progress Notes (Signed)
 This patient's chart has been reviewed by a [facility] Specialist.   Attempted to contact patient in order to discuss appointments and health maintenance screenings due for the upcoming year.   UTR (No voicemail / No phone number / Invalid phone number). and Left message with [facility] Specialist's contact information.[name]

## 2024-03-08 ENCOUNTER — Ambulatory Visit: Admitting: Family Medicine

## 2024-03-08 ENCOUNTER — Encounter: Payer: Self-pay | Admitting: Family Medicine

## 2024-03-08 VITALS — BP 136/62 | HR 57 | Temp 98.5°F | Ht 66.5 in | Wt 160.1 lb

## 2024-03-08 DIAGNOSIS — I10 Essential (primary) hypertension: Secondary | ICD-10-CM | POA: Diagnosis not present

## 2024-03-08 DIAGNOSIS — E1122 Type 2 diabetes mellitus with diabetic chronic kidney disease: Secondary | ICD-10-CM

## 2024-03-08 DIAGNOSIS — R351 Nocturia: Secondary | ICD-10-CM

## 2024-03-08 DIAGNOSIS — G3184 Mild cognitive impairment, so stated: Secondary | ICD-10-CM

## 2024-03-08 DIAGNOSIS — E1169 Type 2 diabetes mellitus with other specified complication: Secondary | ICD-10-CM

## 2024-03-08 DIAGNOSIS — E785 Hyperlipidemia, unspecified: Secondary | ICD-10-CM

## 2024-03-08 DIAGNOSIS — N1832 Chronic kidney disease, stage 3b: Secondary | ICD-10-CM | POA: Diagnosis not present

## 2024-03-08 DIAGNOSIS — M546 Pain in thoracic spine: Secondary | ICD-10-CM

## 2024-03-08 DIAGNOSIS — G8929 Other chronic pain: Secondary | ICD-10-CM

## 2024-03-08 DIAGNOSIS — R269 Unspecified abnormalities of gait and mobility: Secondary | ICD-10-CM

## 2024-03-08 MED ORDER — TAMSULOSIN HCL 0.4 MG PO CAPS: 0.4000 mg | ORAL_CAPSULE | Freq: Every day | ORAL | 1 refills | Status: DC
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
## 2024-03-18 ENCOUNTER — Emergency Department (HOSPITAL_COMMUNITY)

## 2024-03-18 ENCOUNTER — Other Ambulatory Visit: Payer: Self-pay

## 2024-03-18 ENCOUNTER — Emergency Department (HOSPITAL_COMMUNITY)
Admission: EM | Admit: 2024-03-18 | Discharge: 2024-03-18 | Disposition: A | Discharge status: Home / Self Care | Attending: Emergency Medicine | Admitting: Emergency Medicine

## 2024-03-18 DIAGNOSIS — S9032XA Contusion of left foot, initial encounter: Secondary | ICD-10-CM | POA: Diagnosis not present

## 2024-03-18 DIAGNOSIS — Z79899 Other long term (current) drug therapy: Secondary | ICD-10-CM | POA: Insufficient documentation

## 2024-03-18 DIAGNOSIS — E1122 Type 2 diabetes mellitus with diabetic chronic kidney disease: Secondary | ICD-10-CM | POA: Insufficient documentation

## 2024-03-18 DIAGNOSIS — N189 Chronic kidney disease, unspecified: Secondary | ICD-10-CM | POA: Diagnosis not present

## 2024-03-18 DIAGNOSIS — S99922A Unspecified injury of left foot, initial encounter: Secondary | ICD-10-CM | POA: Diagnosis present

## 2024-03-18 DIAGNOSIS — Y92410 Unspecified street and highway as the place of occurrence of the external cause: Secondary | ICD-10-CM | POA: Diagnosis not present

## 2024-03-18 DIAGNOSIS — G20A1 Parkinson's disease without dyskinesia, without mention of fluctuations: Secondary | ICD-10-CM | POA: Insufficient documentation

## 2024-03-18 DIAGNOSIS — Z7982 Long term (current) use of aspirin: Secondary | ICD-10-CM | POA: Insufficient documentation

## 2024-03-18 DIAGNOSIS — S39012A Strain of muscle, fascia and tendon of lower back, initial encounter: Secondary | ICD-10-CM | POA: Insufficient documentation

## 2024-03-18 DIAGNOSIS — F0390 Unspecified dementia without behavioral disturbance: Secondary | ICD-10-CM | POA: Diagnosis not present

## 2024-03-18 MED ORDER — HYDROCODONE-ACETAMINOPHEN 5-325 MG PO TABS: 1.0000 | ORAL_TABLET | ORAL | 0 refills | Status: DC | PRN

## 2024-03-18 MED ORDER — OXYCODONE-ACETAMINOPHEN 5-325 MG PO TABS
1.0000 | ORAL_TABLET | Freq: Once | ORAL | Status: AC
  Administered 2024-03-18: 1 via ORAL
  Filled 2024-03-18 (×2): qty 1
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
## 2024-03-18 NOTE — ED Notes (Signed)
Pt. Transported to Xray 

## 2024-03-18 NOTE — ED Notes (Signed)
 Pt transferred to wheelchair x2 assist to assist pt to restroom. Pt reports unable to urinate due to being unable to stand up. Pt reports his legs are not working. Pt returned to stretcher x2 assist.

## 2024-03-18 NOTE — Discharge Instructions (Addendum)
 Follow-up on Wednesday with Dr. Beuford as scheduled.  Return to the emergency room if you have any worsening symptoms.

## 2024-03-18 NOTE — ED Notes (Signed)
 Pt. Returned back from scans

## 2024-04-02 ENCOUNTER — Inpatient Hospital Stay (HOSPITAL_COMMUNITY)
Admission: EM | Admit: 2024-04-02 | Discharge: 2024-04-10 | DRG: 543 | Disposition: A | Discharge status: Skilled Nursing Facility | Attending: Internal Medicine | Admitting: Internal Medicine

## 2024-04-02 ENCOUNTER — Emergency Department (HOSPITAL_COMMUNITY)

## 2024-04-02 ENCOUNTER — Other Ambulatory Visit: Payer: Self-pay

## 2024-04-02 DIAGNOSIS — E1122 Type 2 diabetes mellitus with diabetic chronic kidney disease: Secondary | ICD-10-CM | POA: Diagnosis present

## 2024-04-02 DIAGNOSIS — G3184 Mild cognitive impairment, so stated: Secondary | ICD-10-CM | POA: Diagnosis present

## 2024-04-02 DIAGNOSIS — J302 Other seasonal allergic rhinitis: Secondary | ICD-10-CM | POA: Diagnosis present

## 2024-04-02 DIAGNOSIS — G629 Polyneuropathy, unspecified: Secondary | ICD-10-CM

## 2024-04-02 DIAGNOSIS — R4189 Other symptoms and signs involving cognitive functions and awareness: Secondary | ICD-10-CM | POA: Diagnosis present

## 2024-04-02 DIAGNOSIS — Z7982 Long term (current) use of aspirin: Secondary | ICD-10-CM

## 2024-04-02 DIAGNOSIS — E559 Vitamin D deficiency, unspecified: Secondary | ICD-10-CM | POA: Diagnosis present

## 2024-04-02 DIAGNOSIS — R251 Tremor, unspecified: Secondary | ICD-10-CM | POA: Diagnosis present

## 2024-04-02 DIAGNOSIS — G2581 Restless legs syndrome: Secondary | ICD-10-CM | POA: Diagnosis present

## 2024-04-02 DIAGNOSIS — Z880 Allergy status to penicillin: Secondary | ICD-10-CM

## 2024-04-02 DIAGNOSIS — S32018A Other fracture of first lumbar vertebra, initial encounter for closed fracture: Secondary | ICD-10-CM | POA: Diagnosis not present

## 2024-04-02 DIAGNOSIS — I10 Essential (primary) hypertension: Secondary | ICD-10-CM | POA: Diagnosis present

## 2024-04-02 DIAGNOSIS — R4701 Aphasia: Secondary | ICD-10-CM | POA: Diagnosis present

## 2024-04-02 DIAGNOSIS — I2489 Other forms of acute ischemic heart disease: Secondary | ICD-10-CM | POA: Diagnosis present

## 2024-04-02 DIAGNOSIS — I129 Hypertensive chronic kidney disease with stage 1 through stage 4 chronic kidney disease, or unspecified chronic kidney disease: Secondary | ICD-10-CM | POA: Diagnosis present

## 2024-04-02 DIAGNOSIS — E1169 Type 2 diabetes mellitus with other specified complication: Secondary | ICD-10-CM | POA: Diagnosis present

## 2024-04-02 DIAGNOSIS — S32010A Wedge compression fracture of first lumbar vertebra, initial encounter for closed fracture: Secondary | ICD-10-CM | POA: Diagnosis present

## 2024-04-02 DIAGNOSIS — E7849 Other hyperlipidemia: Secondary | ICD-10-CM | POA: Diagnosis present

## 2024-04-02 DIAGNOSIS — M4856XA Collapsed vertebra, not elsewhere classified, lumbar region, initial encounter for fracture: Principal | ICD-10-CM | POA: Diagnosis present

## 2024-04-02 DIAGNOSIS — Z8419 Family history of other disorders of kidney and ureter: Secondary | ICD-10-CM

## 2024-04-02 DIAGNOSIS — M109 Gout, unspecified: Secondary | ICD-10-CM | POA: Diagnosis present

## 2024-04-02 DIAGNOSIS — W19XXXA Unspecified fall, initial encounter: Principal | ICD-10-CM | POA: Diagnosis present

## 2024-04-02 DIAGNOSIS — E1142 Type 2 diabetes mellitus with diabetic polyneuropathy: Secondary | ICD-10-CM | POA: Diagnosis present

## 2024-04-02 DIAGNOSIS — N183 Chronic kidney disease, stage 3 unspecified: Secondary | ICD-10-CM | POA: Diagnosis present

## 2024-04-02 DIAGNOSIS — Y92009 Unspecified place in unspecified non-institutional (private) residence as the place of occurrence of the external cause: Secondary | ICD-10-CM

## 2024-04-02 DIAGNOSIS — R7989 Other specified abnormal findings of blood chemistry: Secondary | ICD-10-CM | POA: Diagnosis present

## 2024-04-02 DIAGNOSIS — Z79899 Other long term (current) drug therapy: Secondary | ICD-10-CM

## 2024-04-02 DIAGNOSIS — Z23 Encounter for immunization: Secondary | ICD-10-CM

## 2024-04-02 DIAGNOSIS — Z8249 Family history of ischemic heart disease and other diseases of the circulatory system: Secondary | ICD-10-CM

## 2024-04-02 DIAGNOSIS — R29818 Other symptoms and signs involving the nervous system: Secondary | ICD-10-CM | POA: Diagnosis present

## 2024-04-02 DIAGNOSIS — R413 Other amnesia: Secondary | ICD-10-CM | POA: Diagnosis present

## 2024-04-02 DIAGNOSIS — M4854XA Collapsed vertebra, not elsewhere classified, thoracic region, initial encounter for fracture: Secondary | ICD-10-CM | POA: Diagnosis present

## 2024-04-02 DIAGNOSIS — N1832 Chronic kidney disease, stage 3b: Secondary | ICD-10-CM | POA: Diagnosis present

## 2024-04-02 LAB — URINALYSIS, W/ REFLEX TO CULTURE (INFECTION SUSPECTED)
Bilirubin Urine: NEGATIVE
Glucose, UA: NEGATIVE mg/dL
Hgb urine dipstick: NEGATIVE
Ketones, ur: NEGATIVE mg/dL
Leukocytes,Ua: NEGATIVE
Nitrite: NEGATIVE
Protein, ur: NEGATIVE mg/dL
Specific Gravity, Urine: 1.017 (ref 1.005–1.030)
pH: 5 (ref 5.0–8.0)

## 2024-04-02 LAB — CBC WITH DIFFERENTIAL/PLATELET
Abs Immature Granulocytes: 0.02 K/uL (ref 0.00–0.07)
Basophils Absolute: 0.1 K/uL (ref 0.0–0.1)
Basophils Relative: 1 %
Eosinophils Absolute: 0.3 K/uL (ref 0.0–0.5)
Eosinophils Relative: 3 %
HCT: 49.7 % (ref 39.0–52.0)
Hemoglobin: 15.9 g/dL (ref 13.0–17.0)
Immature Granulocytes: 0 %
Lymphocytes Relative: 13 %
Lymphs Abs: 1.1 K/uL (ref 0.7–4.0)
MCH: 27.3 pg (ref 26.0–34.0)
MCHC: 32 g/dL (ref 30.0–36.0)
MCV: 85.2 fL (ref 80.0–100.0)
Monocytes Absolute: 0.5 K/uL (ref 0.1–1.0)
Monocytes Relative: 6 %
Neutro Abs: 6.4 K/uL (ref 1.7–7.7)
Neutrophils Relative %: 77 %
Platelets: 295 K/uL (ref 150–400)
RBC: 5.83 MIL/uL — ABNORMAL HIGH (ref 4.22–5.81)
RDW: 13.6 % (ref 11.5–15.5)
WBC: 8.4 K/uL (ref 4.0–10.5)
nRBC: 0 % (ref 0.0–0.2)

## 2024-04-02 LAB — BASIC METABOLIC PANEL WITH GFR
Anion gap: 14 (ref 5–15)
BUN: 25 mg/dL — ABNORMAL HIGH (ref 8–23)
CO2: 22 mmol/L (ref 22–32)
Calcium: 10 mg/dL (ref 8.9–10.3)
Chloride: 106 mmol/L (ref 98–111)
Creatinine, Ser: 1.88 mg/dL — ABNORMAL HIGH (ref 0.61–1.24)
GFR, Estimated: 37 mL/min — ABNORMAL LOW (ref >60)
Glucose, Bld: 126 mg/dL — ABNORMAL HIGH (ref 70–99)
Potassium: 4 mmol/L (ref 3.5–5.1)
Sodium: 142 mmol/L (ref 135–145)

## 2024-04-02 LAB — TROPONIN T, HIGH SENSITIVITY
Troponin T High Sensitivity: 56 ng/L — ABNORMAL HIGH (ref 0–19)
Troponin T High Sensitivity: 59 ng/L — ABNORMAL HIGH (ref 0–19)

## 2024-04-02 MED ORDER — TETANUS-DIPHTH-ACELL PERTUSSIS 5-2-15.5 LF-MCG/0.5 IM SUSP
0.5000 mL | Freq: Once | INTRAMUSCULAR | Status: AC
  Administered 2024-04-02: 0.5 mL via INTRAMUSCULAR
  Filled 2024-04-02: qty 0.5

## 2024-04-02 MED ORDER — GABAPENTIN 400 MG PO CAPS
400.0000 mg | ORAL_CAPSULE | Freq: Two times a day (BID) | ORAL | Status: DC
  Administered 2024-04-02 – 2024-04-08 (×12): 400 mg via ORAL
  Filled 2024-04-02 (×12): qty 1

## 2024-04-02 MED ORDER — ENALAPRIL MALEATE 10 MG PO TABS
10.0000 mg | ORAL_TABLET | Freq: Every day | ORAL | Status: DC
Start: 2024-04-02 — End: 2024-04-04
  Administered 2024-04-03 – 2024-04-04 (×2): 10 mg via ORAL
  Filled 2024-04-02 (×2): qty 1

## 2024-04-02 MED ORDER — VITAMIN D 25 MCG (1000 UNIT) PO TABS
5000.0000 [IU] | ORAL_TABLET | Freq: Every day | ORAL | Status: DC
  Administered 2024-04-02 – 2024-04-10 (×9): 5000 [IU] via ORAL
  Filled 2024-04-02 (×9): qty 5

## 2024-04-02 MED ORDER — TAMSULOSIN HCL 0.4 MG PO CAPS
0.4000 mg | ORAL_CAPSULE | Freq: Every day | ORAL | Status: DC
  Administered 2024-04-02 – 2024-04-10 (×9): 0.4 mg via ORAL
  Filled 2024-04-02 (×9): qty 1

## 2024-04-02 MED ORDER — SODIUM CHLORIDE 0.9 % IV BOLUS
500.0000 mL | Freq: Once | INTRAVENOUS | Status: AC
  Administered 2024-04-02: 500 mL via INTRAVENOUS

## 2024-04-02 MED ORDER — MEMANTINE HCL ER 28 MG PO CP24
28.0000 mg | ORAL_CAPSULE | Freq: Every day | ORAL | Status: DC
  Administered 2024-04-02 – 2024-04-10 (×9): 28 mg via ORAL
  Filled 2024-04-02 (×9): qty 1

## 2024-04-02 MED ORDER — HYDROCODONE-ACETAMINOPHEN 5-325 MG PO TABS
1.0000 | ORAL_TABLET | Freq: Four times a day (QID) | ORAL | Status: DC | PRN
  Administered 2024-04-02 – 2024-04-07 (×6): 1 via ORAL
  Filled 2024-04-02 (×7): qty 1

## 2024-04-02 MED ORDER — ASPIRIN 81 MG PO TBEC
81.0000 mg | DELAYED_RELEASE_TABLET | Freq: Every day | ORAL | Status: DC
  Administered 2024-04-02 – 2024-04-10 (×9): 81 mg via ORAL
  Filled 2024-04-02 (×9): qty 1

## 2024-04-02 MED ORDER — EZETIMIBE 10 MG PO TABS
10.0000 mg | ORAL_TABLET | Freq: Every day | ORAL | Status: DC
  Administered 2024-04-02 – 2024-04-10 (×8): 10 mg via ORAL
  Filled 2024-04-02 (×9): qty 1

## 2024-04-02 NOTE — ED Notes (Signed)
 Patient informed that urine sample is needed. Toileting offered. Patient states unable to void at this time

## 2024-04-02 NOTE — ED Triage Notes (Addendum)
 Pt BIB EMS due to fall. Pt states fell and hit head on glass table table due to leg weakness about 655am. Skin tear to back of head. Denies dizziness, LOC, not on thinners, and no new pain. Pt complains of worsening tremors apon walking.

## 2024-04-02 NOTE — TOC Transition Note (Signed)
 Transition of Care [facility]) - Discharge Note  Patient Details  Name: [name] MRN: [id number] Date of Birth: 18-Apr-1951  Transition of Care [facility]) CM/SW Contact:  Nena LITTIE Coffee, RN Phone Number: 04/02/2024, 3:18 PM  Clinical Narrative:    [facility] consulted for [facility] PT. [facility] accepted referral and will contact pt to set up home visits. Pt's wife  & AVS updated.     Barriers to Discharge: Barriers Resolved  Patient Goals and CMS Choice   Discharge Placement   Discharge Plan and Services Additional resources added to the After Visit Summary for      [facility] Arranged: PT HH Agency: [facility] ([facility]) Date [facility] Agency Contacted: 04/02/24   Representative spoke with at [facility] Agency: [facility]  Social Drivers of Health (SDOH) Interventions SDOH Screenings   Food Insecurity: No Food Insecurity (09/28/2023)   Received from [employer]  Housing: Low Risk  (09/28/2023)   Received from [facility]  Transportation Needs: No Transportation Needs (09/28/2023)   Received from [facility]  Utilities: Not At Risk (09/28/2023)   Received from [facility]  Depression (PHQ2-9): Low Risk  (04/12/2023)  Financial Resource Strain: Low Risk  (09/28/2023)   Received from [facility]  Tobacco Use: Low Risk  (03/08/2024)   Readmission Risk Interventions     No data to display

## 2024-04-02 NOTE — ED Provider Notes (Signed)
 Patient was seen by physical therapy who does not feel that patient is safe to ambulate unassisted.  Discussed with the patient and the patient's wife at bedside and they are wanting to pursue placement for rehab.  Will consult TOC.   [name], MD 04/02/24 1640

## 2024-04-02 NOTE — ED Notes (Signed)
 Full linen and gown change. All needs met.

## 2024-04-02 NOTE — ED Notes (Signed)
 Pt coming to 31. Pt has 2 meds not verified by pharmacy.
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
## 2024-04-02 NOTE — Discharge Instructions (Signed)
  [facility] [address], [place], [place] [address] Phone: [phone] *[facility] will contact you to set up home physical therapy  Continue taking all medication as prescribed.  Please follow-up with your primary care doctor within 1 week for an exam.

## 2024-04-02 NOTE — ED Provider Notes (Signed)
 [place] EMERGENCY DEPARTMENT AT [facility] Provider Note  CSN: [id number] Arrival date & time: 04/02/24 9177  Chief Complaint(s) No chief complaint on file.  HPI [name] is a 73 y.o. male who is here today after a fall.  Patient was getting up for the morning, states that his legs felt weak and he fell over backward.  He states I do not remember what happened.  He is unsure if he lost consciousness.  Patient with a history of memory impairment, tremors.  Does follow with neurology.   Past Medical History Past Medical History:  Diagnosis Date   Allergy    Bone spur    Left Shoulder   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 (HCC) 07/18/2017   Fracture    right forearm   Gout    Hyperlipidemia    Hypertension    Other testicular hypofunction    PONV (postoperative nausea and vomiting)    Type II or unspecified type diabetes mellitus without mention of complication, not stated as uncontrolled    Patient Active Problem List   Diagnosis Date Noted   Aphasia 09/28/2023   Memory loss 09/28/2023   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   Neuropathy 09/28/2023   Tremor 09/28/2023   Chronic bilateral low back pain with right-sided sciatica 03/15/2023   Gait abnormality 03/15/2023   Mild neurocognitive disorder 11/04/2022   Parkinsonian features 09/06/2022   RLS (restless legs syndrome) 09/06/2022   MCI (mild cognitive impairment) 09/06/2022   Senile purpura 01/08/2020   Statin myopathy 12/19/2019   Diabetes, polyneuropathy (HCC) 12/17/2018   Statin intolerance 12/17/2018   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 (HCC) 07/18/2017   Vitamin D  deficiency 08/29/2013   Medication management 08/29/2013   Hyperlipidemia associated with type 2 diabetes mellitus (HCC)    Hypertension    Type 2 diabetes mellitus with stage 3 chronic kidney disease (HCC)    Testosterone  deficiency    Gout    Home Medication(s) Prior to Admission medications   Medication Sig Start  Date End Date Taking? Authorizing Provider  ALPHA LIPOIC ACID PO Take by mouth.    [provider]  aspirin EC 81 MG tablet Take 81 mg by mouth daily. Swallow whole.    [provider]  Cholecalciferol (VITAMIN D3 PO) Take 5,000 Units by mouth daily.    [provider]  Cyanocobalamin  (VITAMIN B-12 PO) Take by mouth daily.    [provider]  desoximetasone  (TOPICORT ) 0.25 % cream Apply 1 Application topically daily as needed (Skin rash). 12/07/22   Wilkinson, Dana E, NP  donepezil  (ARICEPT ) 10 MG tablet TAKE 1 TABLET BY MOUTH AT BEDTIME 07/10/23   [name], MD  enalapril  (VASOTEC ) 10 MG tablet Take 1 tablet (10 mg total) by mouth daily. 12/21/23 12/20/24  Aletha Bene, MD  ezetimibe  (ZETIA ) 10 MG tablet TAKE 1 TABLET BY MOUTH DAILY FOR CHOLESTEROL 07/03/23   Wilkinson, Dana E, NP  gabapentin  (NEURONTIN ) 400 MG capsule Take 1 capsule (400 mg total) by mouth 2 (two) times daily. For tremor and neuropathic pain 01/24/24   [name], MD  HYDROcodone -acetaminophen  (NORCO/VICODIN) 5-325 MG tablet Take 1-2 tablets by mouth every 4 (four) hours as needed. 03/18/24   [name], MD  Magnesium 250 MG TABS Take by mouth 2 (two) times daily.    [provider]  memantine  (NAMENDA  XR) 28 MG CP24 24 hr capsule Take 1 capsule (28 mg total) by mouth daily. 01/24/24  [name], MD  tamsulosin  (FLOMAX ) 0.4 MG CAPS capsule Take 1 capsule (0.4 mg total) by mouth daily. Help with urination 03/08/24   Aletha Bene, MD                                                                                                                                    Past Surgical History Past Surgical History:  Procedure Laterality Date   CATARACT EXTRACTION Left 08/2023   CYST REMOVAL HAND     ORIF RADIAL FRACTURE Right 07/18/2019   Procedure: OPEN REDUCTION INTERNAL FIXATION (ORIF) RADIUS AND ULNA FRACTURE;  Surgeon: Kendal Franky SQUIBB, MD;  Location: [facility] OR;  Service:  Orthopedics;  Laterality: Right;   PROSTATE BIOPSY  2010   WRIST SURGERY Right 1998   Family History Family History  Problem Relation Age of Onset   Heart disease Mother 41       stent   Heart disease Father    Kidney disease Father    Heart failure Father    Alzheimer's disease Paternal Grandfather    Colon cancer Neg Hx    Esophageal cancer Neg Hx    Liver cancer Neg Hx    Pancreatic cancer Neg Hx    Rectal cancer Neg Hx    Stomach cancer Neg Hx     Social History Social History   Tobacco Use   Smoking status: Never   Smokeless tobacco: Never  Vaping Use   Vaping status: Never Used  Substance Use Topics   Alcohol use: Not Currently    Comment: maybe 2-3 beers in a year   Drug use: No   Allergies Crestor [rosuvastatin], Welchol [colesevelam hcl], Zoloft [sertraline hcl], and Penicillins  Review of Systems Review of Systems  Physical Exam Vital Signs  I have reviewed the triage vital signs BP (!) 140/73 (BP Location: Right Arm)   Pulse 65   Temp 97.8 F (36.6 C) (Oral)   Resp 15   Ht 5' 9 (1.753 m)   Wt 67.1 kg   SpO2 100%   BMI 21.86 kg/m   Physical Exam Vitals and nursing note reviewed.  Eyes:     Pupils: Pupils are equal, round, and reactive to light.  Cardiovascular:     Rate and Rhythm: Normal rate.  Pulmonary:     Effort: Pulmonary effort is normal.  Musculoskeletal:        General: Normal range of motion.     Cervical back: Normal range of motion.  Neurological:     Mental Status: He is alert and oriented to person, place, and time.     Comments: Resting tremor present in all extremities.  No numbness or weakness in the bilateral lower extremities or upper extremities.  Normal sensation.  Gait deferred.     ED Results and Treatments Labs (all labs ordered are listed, but only abnormal results are displayed) Labs Reviewed  BASIC METABOLIC PANEL WITH  GFR - Abnormal; Notable for the following components:      Result Value   Glucose,  Bld 126 (*)    BUN 25 (*)    Creatinine, Ser 1.88 (*)    GFR, Estimated 37 (*)    All other components within normal limits  CBC WITH DIFFERENTIAL/PLATELET - Abnormal; Notable for the following components:   RBC 5.83 (*)    All other components within normal limits  URINALYSIS, W/ REFLEX TO CULTURE (INFECTION SUSPECTED) - Abnormal; Notable for the following components:   Bacteria, UA RARE (*)    All other components within normal limits  TROPONIN T, HIGH SENSITIVITY - Abnormal; Notable for the following components:   Troponin T High Sensitivity 56 (*)    All other components within normal limits  TROPONIN T, HIGH SENSITIVITY - Abnormal; Notable for the following components:   Troponin T High Sensitivity 59 (*)    All other components within normal limits                                                                                                                          Radiology CT Cervical Spine Wo Contrast Result Date: 04/02/2024 EXAM: CT CERVICAL SPINE WITHOUT CONTRAST 04/02/2024 09:29:12 AM TECHNIQUE: CT of the cervical spine was performed without the administration of intravenous contrast. Multiplanar reformatted images are provided for review. Automated exposure control, iterative reconstruction, and/or weight based adjustment of the mA/kV was utilized to reduce the radiation dose to as low as reasonably achievable. COMPARISON: None available. CLINICAL HISTORY: Ataxia, cervical trauma. BIB EMS due to fall. Pt states fell and hit head on glass table due to leg weakness about 6:55 AM. Skin tear to back of head. Denies dizziness, LOC, not on thinners, and no new pain. Pt complains of worsening tremors upon walking. FINDINGS: CERVICAL SPINE: BONES AND ALIGNMENT: No acute fracture or traumatic malalignment. DEGENERATIVE CHANGES: Mild endplate osteophytes at multiple levels. Small disc bulges at multiple levels throughout the cervical spine. Facet arthrosis and uncovertebral hypertrophy  at multiple levels. Foraminal stenosis is most pronounced on the right at C3-C4. There is mild to moderate spinal canal stenosis at C3-C4. SOFT TISSUES: No prevertebral soft tissue swelling. IMPRESSION: 1. No acute abnormality of the cervical spine related to the reported trauma. 2. Mild to moderate spinal canal stenosis at C3-C4. Foraminal stenosis most pronounced on the right at C3-C4. Electronically signed by: Donnice Mania MD 04/02/2024 09:51 AM EDT RP Workstation: [id number]   DG Hip Unilat With Pelvis 2-3 Views Right Result Date: 04/02/2024 EXAM: 2 OR MORE VIEW(S) XRAY OF THE UNILATERAL HIP 04/02/2024 09:19:00 AM COMPARISON: None available. CLINICAL HISTORY: pain. Per chart-Pt BIB EMS due to fall. Pt states fell and hit head on glass table table due to leg weakness about 655am. Skin tear to back of head. Denies dizziness, LOC, not on thinners, and no new pain. Pt complains of worsening tremors apon walking FINDINGS: BONES AND JOINTS: No acute fracture or focal osseous  lesion. Osteophytosis of the superior acetabulum. Mild degenerative changes of bilateral hip joints without significant joint space narrowing. The hip joint is maintained. SOFT TISSUES: Vascular calcifications. IMPRESSION: 1. No acute findings. 2. Mild degenerative changes of bilateral hip joints without significant joint space narrowing. Electronically signed by: Donnice Mania MD 04/02/2024 09:47 AM EDT RP Workstation: [id number]   CT Head Wo Contrast Result Date: 04/02/2024 EXAM: CT HEAD WITHOUT CONTRAST 04/02/2024 09:29:12 AM TECHNIQUE: CT of the head was performed without the administration of intravenous contrast. Automated exposure control, iterative reconstruction, and/or weight based adjustment of the mA/kV was utilized to reduce the radiation dose to as low as reasonably achievable. COMPARISON: CT head 03/18/2024. CLINICAL HISTORY: Ataxia, head trauma. BIB EMS due to fall. Pt states fell and hit head on glass table due to leg  weakness about 6:55 AM. Skin tear to back of head. Denies dizziness, LOC, not on thinners, and no new pain. Pt complains of worsening tremors upon walking. FINDINGS: BRAIN AND VENTRICLES: No acute hemorrhage. No evidence of acute infarct. Generalized parenchymal volume loss. Nonspecific hypoattenuation in the periventricular and subcortical white matter, most likely representing chronic microvascular ischemic changes. 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 No hydrocephalus. No extra-axial collection. No mass effect or midline shift. Left lens replacement. ORBITS: No acute abnormality. SINUSES: No acute abnormality. SOFT TISSUES AND SKULL: No acute soft tissue abnormality. No skull fracture. VASCULATURE: Atherosclerosis of the carotid siphons. IMPRESSION: 1. No acute intracranial abnormality. 2. Generalized parenchymal volume loss and mild chronic microvascular ischemic changes. 3. 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 Electronically signed by: Donnice Mania MD 04/02/2024 09:44 AM EDT RP Workstation: [id number]    Pertinent labs & imaging results that were available during my care of the patient were reviewed by me and considered in my medical decision making (see MDM for details).  Medications Ordered in ED Medications  Tdap (ADACEL) injection 0.5 mL (0.5 mLs Intramuscular Given 04/02/24 1312)                                                                                                                                     Procedures Procedures  (including critical care time)  Medical Decision Making / ED Course   This patient presents to the ED for concern of fall, this involves an extensive number of treatment options, and is a complaint that carries with it a high risk of complications and morbidity.  The differential diagnosis includes mechanical fall, syncope, history of tremor, generalized weakness, metabolic abnormalities, dehydration.  MDM: On exam, patient  pleasant, does seem slightly confused.  Reviewing his neurology note, this seems like it may be at his baseline.  Patient had a fall today, cannot give me specific details about it.  Wife is on the way.  Does sound as though he has been having an increasingly difficult time with his tremor.  Reading through his neurology note, sounds  of this happens more often with stress.  While in the room, does have fairly significant tremors in all extremities.  There is no mentions of any parkinsonian features in prior notes.  Will obtain imaging of the patient's head, neck.  He also endorses some pain in his right hip.  Blood work ordered.  Reassessment 12:50 PM-patient's wife at bedside.  She expressed some concern about the patient's ability to ambulate at home.  Patient's blood work is returned.  Initial troponin mildly elevated, however no significant increase.  Does have some chronic kidney disease, no chest pain, EKG nonischemic.  Do not believe this is ACS.  Patient CT imaging negative.  I have placed a TOC and PT consult to see if we can get the patient some additional at home health needs.  Reassessment 3:20 PM-patient has been evaluated by TOC.  We have some additional home health services arranged for the patient.  He will be discharged with his wife.  They are comfortable with discharge.  Additional history obtained: -Additional history obtained from EMS, wife at bedside -External records from outside source obtained and reviewed including: Chart review including previous notes, labs, imaging, consultation notes   Lab Tests: -I ordered, reviewed, and interpreted labs.   The pertinent results include:   Labs Reviewed  BASIC METABOLIC PANEL WITH GFR - Abnormal; Notable for the following components:      Result Value   Glucose, Bld 126 (*)    BUN 25 (*)    Creatinine, Ser 1.88 (*)    GFR, Estimated 37 (*)    All other components within normal limits  CBC WITH DIFFERENTIAL/PLATELET - Abnormal;  Notable for the following components:   RBC 5.83 (*)    All other components within normal limits  URINALYSIS, W/ REFLEX TO CULTURE (INFECTION SUSPECTED) - Abnormal; Notable for the following components:   Bacteria, UA RARE (*)    All other components within normal limits  TROPONIN T, HIGH SENSITIVITY - Abnormal; Notable for the following components:   Troponin T High Sensitivity 56 (*)    All other components within normal limits  TROPONIN T, HIGH SENSITIVITY - Abnormal; Notable for the following components:   Troponin T High Sensitivity 59 (*)    All other components within normal limits      EKG normal sinus rhythm, no ST segment depressions or elevations, no evidence of acute ischemia.  EKG Interpretation Date/Time:  Tuesday April 02 2024 08:38:00 EDT Ventricular Rate:  75 PR Interval:  177 QRS Duration:  95 QT Interval:  404 QTC Calculation: 452 R Axis:   4  Text Interpretation: Sinus rhythm RSR' in V1 or V2, right VCD or RVH Confirmed by Mannie Pac [id number]) on 04/02/2024 9:51:05 AM         Imaging Studies ordered: I ordered imaging studies including CT head, neck I independently visualized and interpreted imaging. I agree with the radiologist interpretation   Medicines ordered and prescription drug management: Meds ordered this encounter  Medications   Tdap (ADACEL) injection 0.5 mL    -I have reviewed the patients home medicines and have made adjustments as needed  Cardiac Monitoring: The patient was maintained on a cardiac monitor.  I personally viewed and interpreted the cardiac monitored which showed an underlying rhythm of: Normal sinus rhythm  Social Determinants of Health:  Factors impacting patients care include: Lack of access to primary care   Reevaluation: After the interventions noted above, I reevaluated the patient and found that they have :  improved  Co morbidities that complicate the patient evaluation  Past Medical History:   Diagnosis Date   Allergy    Bone spur    Left Shoulder   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 (HCC) 07/18/2017   Fracture    right forearm   Gout    Hyperlipidemia    Hypertension    Other testicular hypofunction    PONV (postoperative nausea and vomiting)    Type II or unspecified type diabetes mellitus without mention of complication, not stated as uncontrolled       Dispostion: I considered admission for this patient, however with his reassuring workup, negative traumatic workup, and additional resources on an outpatient basis, I believe he is appropriate for discharge.     Final Clinical Impression(s) / ED Diagnoses Final diagnoses:  Fall, initial encounter     @PCDICTATION @    Mannie Pac T, DO 04/02/24 1523

## 2024-04-02 NOTE — ED Notes (Signed)
 Patient with wound to back of the head sustained during fall this morning.

## 2024-04-03 ENCOUNTER — Emergency Department (HOSPITAL_COMMUNITY)

## 2024-04-03 LAB — TROPONIN T, HIGH SENSITIVITY
Troponin T High Sensitivity: 52 ng/L — ABNORMAL HIGH (ref 0–19)
Troponin T High Sensitivity: 57 ng/L — ABNORMAL HIGH (ref 0–19)

## 2024-04-03 LAB — CBC WITH DIFFERENTIAL/PLATELET
Abs Immature Granulocytes: 0.02 K/uL (ref 0.00–0.07)
Basophils Absolute: 0.1 K/uL (ref 0.0–0.1)
Basophils Relative: 1 %
Eosinophils Absolute: 0.2 K/uL (ref 0.0–0.5)
Eosinophils Relative: 2 %
HCT: 48.2 % (ref 39.0–52.0)
Hemoglobin: 16.2 g/dL (ref 13.0–17.0)
Immature Granulocytes: 0 %
Lymphocytes Relative: 14 %
Lymphs Abs: 1.2 K/uL (ref 0.7–4.0)
MCH: 27.8 pg (ref 26.0–34.0)
MCHC: 33.6 g/dL (ref 30.0–36.0)
MCV: 82.7 fL (ref 80.0–100.0)
Monocytes Absolute: 0.6 K/uL (ref 0.1–1.0)
Monocytes Relative: 7 %
Neutro Abs: 6.3 K/uL (ref 1.7–7.7)
Neutrophils Relative %: 76 %
Platelets: 303 K/uL (ref 150–400)
RBC: 5.83 MIL/uL — ABNORMAL HIGH (ref 4.22–5.81)
RDW: 13.2 % (ref 11.5–15.5)
WBC: 8.4 K/uL (ref 4.0–10.5)
nRBC: 0 % (ref 0.0–0.2)

## 2024-04-03 LAB — BASIC METABOLIC PANEL WITH GFR
Anion gap: 16 — ABNORMAL HIGH (ref 5–15)
BUN: 26 mg/dL — ABNORMAL HIGH (ref 8–23)
CO2: 18 mmol/L — ABNORMAL LOW (ref 22–32)
Calcium: 10.1 mg/dL (ref 8.9–10.3)
Chloride: 104 mmol/L (ref 98–111)
Creatinine, Ser: 1.69 mg/dL — ABNORMAL HIGH (ref 0.61–1.24)
GFR, Estimated: 42 mL/min — ABNORMAL LOW (ref >60)
Glucose, Bld: 145 mg/dL — ABNORMAL HIGH (ref 70–99)
Potassium: 3.9 mmol/L (ref 3.5–5.1)
Sodium: 138 mmol/L (ref 135–145)

## 2024-04-03 MED ORDER — DIPHENHYDRAMINE HCL 25 MG PO CAPS
25.0000 mg | ORAL_CAPSULE | Freq: Once | ORAL | Status: AC
Start: 2024-04-03 — End: 2024-04-03
  Administered 2024-04-03: 25 mg via ORAL
  Filled 2024-04-03: qty 1

## 2024-04-03 NOTE — ED Notes (Signed)
 MRI at bedside
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
## 2024-04-03 NOTE — ED Provider Notes (Addendum)
 Emergency Medicine Observation Re-evaluation Note  [name] is a 73 y.o. male, seen on rounds today.  Pt initially presented to the ED for complaints of No chief complaint on file. Currently, the patient is in his room without complaint.  Physical Exam  BP (!) 141/80 (BP Location: Right Arm)   Pulse 72   Temp 98 F (36.7 C) (Oral)   Resp 18   Ht 5' 9 (1.753 m)   Wt 67.1 kg   SpO2 100%   BMI 21.86 kg/m  Physical Exam General: Awake, alert Cardiac: Normal rate Lungs: Normal effort Psych: Mood is appropriate  ED Course / MDM  EKG:EKG Interpretation Date/Time:  Tuesday April 02 2024 08:38:00 EDT Ventricular Rate:  75 PR Interval:  177 QRS Duration:  95 QT Interval:  404 QTC Calculation: 452 R Axis:   4  Text Interpretation: Sinus rhythm RSR' in V1 or V2, right VCD or RVH Confirmed by Mannie Pac [phone]) on 04/02/2024 9:51:05 AM  I have reviewed the labs performed to date as well as medications administered while in observation.  Recent changes in the last 24 hours include evaluation yesterday for a fall.  I had seen the patient, the plan had been for discharge, however physical therapy had concerns about the patient's ability to ambulate safely at home.  Family then chose placement for short-term rehab.  Reassessment 12:50 PM-patient with his wife at bedside.  Had been able to ambulate with a rolling walker pretty well with physical therapy.  Wife stating that if the patient is able to use a walker at home, she would prefer to go home with home health PT rather than go to a skilled nursing facility.  We have set up home health with PT.  Patient to be discharged.  Reassessment 2:10 Pm-as patient was preparing to leave, he began to have worsening tremors.  Reviewing his neurology note, does believe that these tremors worsen with anxiety.  He has no focal or lateralizing deficits.  Wife no longer feels comfortable bringing the patient back home.  I have ordered some repeat  labs.  Have informed oncoming 3 PM physician to follow labs to see if admission is warranted. Plan  Current plan is for labs, likely SNF placement.   Mannie Pac T, DO 04/03/24 0743    Mannie Pac DASEN, DO 04/03/24 1249    Mannie Pac T, DO 04/03/24 [phone]

## 2024-04-03 NOTE — ED Notes (Signed)
 Patients daughter took his clothes home

## 2024-04-03 NOTE — Progress Notes (Signed)
 Pt recommended for SNF and pt/family agreeable. Fl2 completed and bed requests sent in hub. CSW to follow up with SNF bed offers.

## 2024-04-03 NOTE — ED Notes (Signed)
Meal Tray at bedside 

## 2024-04-03 NOTE — NC FL2 (Signed)
 [place]  MEDICAID FL2 LEVEL OF CARE FORM     IDENTIFICATION  Patient Name: [name] Birthdate: Nov 15, 1950 Sex: male Admission Date (Current Location): 04/02/2024  [facility] and [place] Number:  Producer, television/film/video and Address:  [facility], [address] [place], [place] [address]      Provider Number: [id number]  Attending Physician Name and Address:  Mannie Fairy DASEN, DO  Relative Name and Phone Number:  Tolsma,[name] (Spouse)  [phone] (Mobile)    Current Level of Care: Hospital Recommended Level of Care: Skilled Nursing Facility Prior Approval Number:    Date Approved/Denied:   PASRR Number: [id number]  Discharge Plan: SNF    Current Diagnoses: Patient Active Problem List   Diagnosis Date Noted   Aphasia 09/28/2023   Memory loss 09/28/2023   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   Neuropathy 09/28/2023   Tremor 09/28/2023   Chronic bilateral low back pain with right-sided sciatica 03/15/2023   Gait abnormality 03/15/2023   Mild neurocognitive disorder 11/04/2022   Parkinsonian features 09/06/2022   RLS (restless legs syndrome) 09/06/2022   MCI (mild cognitive impairment) 09/06/2022   Senile purpura 01/08/2020   Statin myopathy 12/19/2019   Diabetes, polyneuropathy (HCC) 12/17/2018   Statin intolerance 12/17/2018   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 (HCC) 07/18/2017   Vitamin D  deficiency 08/29/2013   Medication management 08/29/2013   Hyperlipidemia associated with type 2 diabetes mellitus (HCC)    Hypertension    Type 2 diabetes mellitus with stage 3 chronic kidney disease (HCC)    Testosterone  deficiency    Gout     Orientation RESPIRATION BLADDER Height & Weight     Self, Situation, Time, Place  Normal Continent Weight: 148 lb (67.1 kg) Height:  5' 9 (175.3 cm)  BEHAVIORAL SYMPTOMS/MOOD NEUROLOGICAL BOWEL NUTRITION STATUS      Continent Diet (see d/c summary)  AMBULATORY STATUS COMMUNICATION OF NEEDS Skin    Extensive Assist Verbally Normal                       Personal Care Assistance Level of Assistance  Feeding, Bathing, Dressing Bathing Assistance: Maximum assistance Feeding assistance: Independent Dressing Assistance: Limited assistance     Functional Limitations Info  Sight, Hearing, Speech Sight Info: Adequate Hearing Info: Adequate Speech Info: Adequate    SPECIAL CARE FACTORS FREQUENCY  PT (By licensed PT), OT (By licensed OT)     PT Frequency: 5x/week OT Frequency: 5x/week            Contractures Contractures Info: Not present    Additional Factors Info  Code Status, Allergies Code Status Info: full code Allergies Info: tape, Crestor (rosuvastatin), penicillins, Welchol (colesevelam Hcl), Zoloft (sertraline Hcl)           Current Medications (04/03/2024):  This is the current hospital active medication list Current Facility-Administered Medications  Medication Dose Route Frequency Provider Last Rate Last Admin   aspirin EC tablet 81 mg  81 mg Oral Daily [name], [name], MD   81 mg at 04/02/24 1721   cholecalciferol (VITAMIN D3) 25 MCG (1000 UNIT) tablet 5,000 Units  5,000 Units Oral Daily [name], MD   5,000 Units at 04/02/24 1728   enalapril  (VASOTEC ) tablet 10 mg  10 mg Oral Daily [name], MD       ezetimibe  (ZETIA ) tablet 10 mg  10 mg Oral Daily [name], [name], MD   10 mg at 04/02/24 1719   gabapentin  (NEURONTIN ) capsule  400 mg  400 mg Oral BID [name], MD   400 mg at 04/02/24 2230   HYDROcodone -acetaminophen  (NORCO/VICODIN) 5-325 MG per tablet 1 tablet  1 tablet Oral Q6H PRN [name], MD   1 tablet at 04/02/24 1726   memantine  (NAMENDA  XR) 24 hr capsule 28 mg  28 mg Oral Daily [name], [name], MD   28 mg at 04/02/24 2042   tamsulosin  (FLOMAX ) capsule 0.4 mg  0.4 mg Oral Daily [name], MD   0.4 mg at 04/02/24 1719   Current Outpatient Medications  Medication Sig Dispense Refill   allopurinol  (ZYLOPRIM ) 300 MG tablet  Take 300 mg by mouth daily as needed (for gout flares).     ALPHA LIPOIC ACID PO Take 1 capsule by mouth daily.     aspirin EC 81 MG tablet Take 81 mg by mouth 2 (two) times a week. Swallow whole.     barrier cream (NON-SPECIFIED) CREA Apply 1 Application topically See admin instructions. Apply to sacral area 3 times a day     Cholecalciferol (VITAMIN D3 PO) Take 5,000 Units by mouth daily.     Cyanocobalamin  (VITAMIN B-12 PO) Take 1 tablet by mouth daily.     desoximetasone  (TOPICORT ) 0.25 % cream Apply 1 Application topically daily as needed (Skin rash). (Patient taking differently: Apply 1 Application topically daily as needed (for skin rashes).) 30 g 1   donepezil  (ARICEPT ) 5 MG tablet Take 5 mg by mouth in the morning.     enalapril  (VASOTEC ) 10 MG tablet Take 1 tablet (10 mg total) by mouth daily. (Patient taking differently: Take 5 mg by mouth daily as needed (for elevated blood pressure).) 90 tablet 1   gabapentin  (NEURONTIN ) 400 MG capsule Take 1 capsule (400 mg total) by mouth 2 (two) times daily. For tremor and neuropathic pain 180 capsule 4   Magnesium 250 MG TABS Take 250 mg by mouth daily.     memantine  (NAMENDA  XR) 28 MG CP24 24 hr capsule Take 1 capsule (28 mg total) by mouth daily. 90 capsule 4   methocarbamol (ROBAXIN) 500 MG tablet Take 1,000 mg by mouth See admin instructions. Take 1,000 mg by mouth every five hours     tamsulosin  (FLOMAX ) 0.4 MG CAPS capsule Take 1 capsule (0.4 mg total) by mouth daily. Help with urination 90 capsule 1   traMADol (ULTRAM) 50 MG tablet Take 50 mg by mouth 3 (three) times daily.     donepezil  (ARICEPT ) 10 MG tablet TAKE 1 TABLET BY MOUTH AT BEDTIME (Patient not taking: Reported on 04/02/2024) 90 tablet 0   ezetimibe  (ZETIA ) 10 MG tablet TAKE 1 TABLET BY MOUTH DAILY FOR CHOLESTEROL (Patient not taking: Reported on 04/02/2024) 90 tablet 3   HYDROcodone -acetaminophen  (NORCO/VICODIN) 5-325 MG tablet Take 1-2 tablets by mouth every 4 (four) hours as  needed. (Patient not taking: Reported on 04/02/2024) 10 tablet 0     Discharge Medications: Please see discharge summary for a list of discharge medications.  Relevant Imaging Results:  Relevant Lab Results:   Additional Information SSN [id number] [place], [place]

## 2024-04-04 ENCOUNTER — Observation Stay (HOSPITAL_COMMUNITY)

## 2024-04-04 ENCOUNTER — Encounter (HOSPITAL_COMMUNITY): Payer: Self-pay | Admitting: Internal Medicine

## 2024-04-04 DIAGNOSIS — S32010A Wedge compression fracture of first lumbar vertebra, initial encounter for closed fracture: Secondary | ICD-10-CM | POA: Diagnosis not present

## 2024-04-04 DIAGNOSIS — R7989 Other specified abnormal findings of blood chemistry: Secondary | ICD-10-CM | POA: Diagnosis present

## 2024-04-04 LAB — T4, FREE: Free T4: 1.19 ng/dL — ABNORMAL HIGH (ref 0.61–1.12)

## 2024-04-04 LAB — CBC
HCT: 48.5 % (ref 39.0–52.0)
Hemoglobin: 15.7 g/dL (ref 13.0–17.0)
MCH: 27.3 pg (ref 26.0–34.0)
MCHC: 32.4 g/dL (ref 30.0–36.0)
MCV: 84.3 fL (ref 80.0–100.0)
Platelets: 273 K/uL (ref 150–400)
RBC: 5.75 MIL/uL (ref 4.22–5.81)
RDW: 13.3 % (ref 11.5–15.5)
WBC: 6.8 K/uL (ref 4.0–10.5)
nRBC: 0 % (ref 0.0–0.2)

## 2024-04-04 LAB — COMPREHENSIVE METABOLIC PANEL WITH GFR
ALT: 13 U/L (ref 0–44)
AST: 22 U/L (ref 15–41)
Albumin: 4.1 g/dL (ref 3.5–5.0)
Alkaline Phosphatase: 181 U/L — ABNORMAL HIGH (ref 38–126)
Anion gap: 14 (ref 5–15)
BUN: 28 mg/dL — ABNORMAL HIGH (ref 8–23)
CO2: 20 mmol/L — ABNORMAL LOW (ref 22–32)
Calcium: 9.4 mg/dL (ref 8.9–10.3)
Chloride: 105 mmol/L (ref 98–111)
Creatinine, Ser: 2 mg/dL — ABNORMAL HIGH (ref 0.61–1.24)
GFR, Estimated: 35 mL/min — ABNORMAL LOW (ref >60)
Glucose, Bld: 167 mg/dL — ABNORMAL HIGH (ref 70–99)
Potassium: 3.9 mmol/L (ref 3.5–5.1)
Sodium: 139 mmol/L (ref 135–145)
Total Bilirubin: 0.5 mg/dL (ref 0.0–1.2)
Total Protein: 6.5 g/dL (ref 6.5–8.1)

## 2024-04-04 LAB — AMMONIA: Ammonia: 16 umol/L (ref 9–35)

## 2024-04-04 LAB — MAGNESIUM: Magnesium: 2.1 mg/dL (ref 1.7–2.4)

## 2024-04-04 LAB — PHOSPHORUS: Phosphorus: 3.2 mg/dL (ref 2.5–4.6)

## 2024-04-04 LAB — TSH: TSH: 2.57 u[IU]/mL (ref 0.350–4.500)

## 2024-04-04 MED ORDER — MELATONIN 3 MG PO TABS
3.0000 mg | ORAL_TABLET | Freq: Every evening | ORAL | Status: DC | PRN
  Administered 2024-04-07 – 2024-04-09 (×3): 3 mg via ORAL
  Filled 2024-04-04 (×3): qty 1

## 2024-04-04 MED ORDER — ACETAMINOPHEN 325 MG PO TABS
650.0000 mg | ORAL_TABLET | Freq: Four times a day (QID) | ORAL | Status: DC | PRN
  Administered 2024-04-08: 650 mg via ORAL
  Filled 2024-04-04: qty 2

## 2024-04-04 MED ORDER — ONDANSETRON HCL 4 MG/2ML IJ SOLN
4.0000 mg | Freq: Four times a day (QID) | INTRAMUSCULAR | Status: DC | PRN
  Administered 2024-04-07 – 2024-04-08 (×4): 4 mg via INTRAVENOUS
  Filled 2024-04-04 (×4): qty 2

## 2024-04-04 MED ORDER — SODIUM CHLORIDE 0.45 % IV SOLN: INTRAVENOUS | Status: AC

## 2024-04-04 MED ORDER — LIDOCAINE 5 % EX PTCH
1.0000 | MEDICATED_PATCH | CUTANEOUS | Status: DC
  Administered 2024-04-04 – 2024-04-08 (×5): 1 via TRANSDERMAL
  Filled 2024-04-04 (×6): qty 1

## 2024-04-04 MED ORDER — ACETAMINOPHEN 650 MG RE SUPP: 650.0000 mg | Freq: Four times a day (QID) | RECTAL | Status: DC | PRN

## 2024-04-04 NOTE — ED Notes (Signed)
 Attempted to call report Monica  3w charge nurse unable to take at this time as she is looking into appropriatiness of the admisson.
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
## 2024-04-04 NOTE — Progress Notes (Signed)
 Orthopedic Tech Progress Note Patient Details:  [name] 1950/10/18 [id number]  Patient ID: [name], male   DOB: 08-20-1950, 73 y.o.   MRN: [id number]  [name] 04/04/2024, 7:38 PM Tlso ordered from [facility] clinic

## 2024-04-04 NOTE — Progress Notes (Signed)
  Carryover admission to the Day Admitter.  I discussed this case with the EDP, Dr. Bari.  Per these discussions:   This is a 73 year old male who presents from home with multiple recent ground-level falls in the context of new onset issues with balance over the course the last 2 months, with patient noting some new onset low back discomfort.  It is in the increased frequency of falls as well as acute onset back discomfort, patient's wife conveys that she is having progressive difficulty in providing the necessary level of assistance with the patient's increasing ADL needs.   MRI of the lumbar spine shows an acute L1 compression fracture with 35% height reduction, will also showing a chronic T12 compression fracture with 50% height loss.   EDP discussed patient's case and radiographic findings with on-call neurosurgery this morning, who recommended TL SLO brace, PT/OT consults, pain control.  Neurosurgery conveyed that there is not currently any indication for urgent or emergent surgical intervention, and requested that the patient follow-up in neurosurgery clinic as an outpatient.  I have placed an order for observation for further evaluation management of the above.  I have placed some additional preliminary admit orders via the adult multi-morbid admission order set. I have also ordered continuation of existing order for prn Norco and added Lidoderm  patch to be applied to affected area of back.  Have also ordered fall precautions, PT/OT consults.    Eva Pore, DO Hospitalist

## 2024-04-04 NOTE — H&P (Signed)
 History and Physical    Patient: [name] FMW:[id number] DOB: 10/19/1950 DOA: 04/02/2024 DOS: the patient was seen and examined on 04/04/2024 PCP: Aletha Bene, MD  Patient coming from: Home  Chief Complaint: No chief complaint on file.  HPI: [name] is a 73 y.o. male with medical history significant of seasonal allergies, left shoulder bone spur, stage III CKD, type 2 diabetes, diabetic polyneuropathy, gout, hyperlipidemia, hypertension, hypogonadism, mild cognitive impairment, restless syndrome, parkinsonian features, tremor, vitamin D  deficiency who was brought to the emergency department due to confusion and abnormal gait. He is unable to elaborate, but is able to answer simple questions. He denies headache, chest, back or abdominal pain at this time.  No general complaints.  Lab work: Urinalysis showed rare bacteria but was otherwise unremarkable.  CBC showed a white count of 8.4, hemoglobin 15.9 g/dL platelets 704.  Troponin T was 56, then 59, 52 ng/L and 57.  BMP showed glucose of 126, BUN 25 creatinine 1.88 mg/dL.  Electrolytes were normal.  Ammonia was 16 mol/L.  TSH was 2.570 international units/mL and free T4 1.19 mg/dL.  Imaging: MRI brain without contrast showing no acute intercranial normality.  Shows mild age-related cerebral atrophy with chronic small vessel ischemic disease.  MRI of the lumbar spine showing acute compression fracture of L1 superior endplate with up to 35% height loss, but with no significant retropulsion.  There is a chronic compression fracture of T12 with up to 50% height loss and trace (2 mm) bony retropulsion.  There is underlying multilevel lumbar spondylosis without significant spinal stenosis.  Mild to moderate multilevel foraminal narrowing as below.  CT cervical spine from 2 days ago with no acute abnormality of the cervical spine.  There was mild to moderate spinal canal stenosis at C3-C4 with foraminal stenosis also most pronounced at the right  C3-C4 level.  ED course: Initial vital signs were temperature 98 F, pulse 73, respiration 19, BP 141/80 mmHg and O2 sat 100% on room air.  The patient received diphenhydramine 25 mg p.o. x 1 yesterday evening.  Review of Systems: As mentioned in the history of present illness. All other systems reviewed and are negative. Past Medical History:  Diagnosis Date   Allergy    Bone spur    Left Shoulder   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 (HCC) 07/18/2017   Fracture    right forearm   Gout    Hyperlipidemia    Hypertension    Other testicular hypofunction    PONV (postoperative nausea and vomiting)    Type II or unspecified type diabetes mellitus without mention of complication, not stated as uncontrolled    Past Surgical History:  Procedure Laterality Date   CATARACT EXTRACTION Left 08/2023   CYST REMOVAL HAND     ORIF RADIAL FRACTURE Right 07/18/2019   Procedure: OPEN REDUCTION INTERNAL FIXATION (ORIF) RADIUS AND ULNA FRACTURE;  Surgeon: Kendal Franky SQUIBB, MD;  Location: [facility] OR;  Service: Orthopedics;  Laterality: Right;   PROSTATE BIOPSY  2010   WRIST SURGERY Right 1998   Social History:  reports that he has never smoked. He has never used smokeless tobacco. He reports that he does not currently use alcohol. He reports that he does not use drugs.  Allergies  Allergen Reactions   Tape Other (See Comments)    SKIN IS VERY THIN. IT TEARS AND BRUISES VERY EASILY!!   Crestor [Rosuvastatin] Diarrhea   Welchol [Colesevelam Hcl] Diarrhea   Zoloft [Sertraline Hcl] Other (  See Comments)    Reaction not recalled    Penicillins Hives, Nausea And Vomiting, Swelling and Other (See Comments)    Severe hives and possible swelling of the throat    Family History  Problem Relation Age of Onset   Heart disease Mother 100       stent   Heart disease Father    Kidney disease Father    Heart failure Father    Alzheimer's disease Paternal Grandfather    Colon cancer Neg Hx     Esophageal cancer Neg Hx    Liver cancer Neg Hx    Pancreatic cancer Neg Hx    Rectal cancer Neg Hx    Stomach cancer Neg Hx     Prior to Admission medications   Medication Sig Start Date End Date Taking? Authorizing Provider  allopurinol  (ZYLOPRIM ) 300 MG tablet Take 300 mg by mouth daily as needed (for gout flares).   Yes [provider]  ALPHA LIPOIC ACID PO Take 1 capsule by mouth daily.   Yes [provider]  aspirin EC 81 MG tablet Take 81 mg by mouth 2 (two) times a week. Swallow whole.   Yes [provider]  barrier cream (NON-SPECIFIED) CREA Apply 1 Application topically See admin instructions. Apply to sacral area 3 times a day   Yes [provider]  Cholecalciferol (VITAMIN D3 PO) Take 5,000 Units by mouth daily.   Yes [provider]  Cyanocobalamin  (VITAMIN B-12 PO) Take 1 tablet by mouth daily.   Yes [provider]  desoximetasone  (TOPICORT ) 0.25 % cream Apply 1 Application topically daily as needed (Skin rash). Patient taking differently: Apply 1 Application topically daily as needed (for skin rashes). 12/07/22  Yes Wilkinson, Dana E, NP  donepezil  (ARICEPT ) 5 MG tablet Take 5 mg by mouth in the morning.   Yes [provider]  enalapril  (VASOTEC ) 10 MG tablet Take 1 tablet (10 mg total) by mouth daily. Patient taking differently: Take 5 mg by mouth daily as needed (for elevated blood pressure). 12/21/23 12/20/24 Yes Aletha Bene, MD  gabapentin  (NEURONTIN ) 400 MG capsule Take 1 capsule (400 mg total) by mouth 2 (two) times daily. For tremor and neuropathic pain 01/24/24  Yes [name], MD  Magnesium 250 MG TABS Take 250 mg by mouth daily.   Yes [provider]  memantine  (NAMENDA  XR) 28 MG CP24 24 hr capsule Take 1 capsule (28 mg total) by mouth daily. 01/24/24  Yes [name], MD  methocarbamol (ROBAXIN) 500 MG tablet Take 1,000 mg by mouth See admin instructions. Take 1,000 mg by mouth every five  hours   Yes [provider]  tamsulosin  (FLOMAX ) 0.4 MG CAPS capsule Take 1 capsule (0.4 mg total) by mouth daily. Help with urination 03/08/24  Yes Aletha Bene, MD  traMADol (ULTRAM) 50 MG tablet Take 50 mg by mouth 3 (three) times daily.   Yes [provider]  donepezil  (ARICEPT ) 10 MG tablet TAKE 1 TABLET BY MOUTH AT BEDTIME Patient not taking: Reported on 04/02/2024 07/10/23   [name], MD  ezetimibe  (ZETIA ) 10 MG tablet TAKE 1 TABLET BY MOUTH DAILY FOR CHOLESTEROL Patient not taking: Reported on 04/02/2024 07/03/23   Wilkinson, Dana E, NP  HYDROcodone -acetaminophen  (NORCO/VICODIN) 5-325 MG tablet Take 1-2 tablets by mouth every 4 (four) hours as needed. Patient not taking: Reported on 04/02/2024 03/18/24   [name], MD    Physical Exam: Vitals:   04/03/24 1935 04/03/24 2031 04/03/24  2214 04/04/24 0640  BP:  (!) 148/72 (!) 142/96 135/72  Pulse:  71 76 62  Resp:  18 20 18   Temp:  98.1 F (36.7 C) 99 F (37.2 C) 98.5 F (36.9 C)  TempSrc:  Oral Axillary Oral  SpO2: 99% 99% 100% 100%  Weight:      Height:       Physical Exam Vitals reviewed.  Constitutional:      General: He is awake. He is not in acute distress.    Appearance: He is normal weight. He is ill-appearing.  HENT:     Head: Normocephalic.     Nose: No rhinorrhea.     Mouth/Throat:     Mouth: Mucous membranes are dry.  Eyes:     General: No scleral icterus.    Pupils: Pupils are equal, round, and reactive to light.  Neck:     Vascular: No JVD.  Cardiovascular:     Rate and Rhythm: Normal rate and regular rhythm.     Heart sounds: S1 normal and S2 normal.  Pulmonary:     Effort: Pulmonary effort is normal.     Breath sounds: No wheezing, rhonchi or rales.  Abdominal:     General: Bowel sounds are normal. There is no distension.     Palpations: Abdomen is soft.     Tenderness: There is no abdominal tenderness. There is no right CVA tenderness or left CVA tenderness.   Musculoskeletal:     Cervical back: Neck supple.     Right lower leg: No edema.     Left lower leg: No edema.  Skin:    General: Skin is warm and dry.  Neurological:     Mental Status: He is alert.     Comments: Oriented x 3 with some difficulty.  Psychiatric:        Mood and Affect: Mood normal.        Behavior: Behavior is cooperative.     Data Reviewed:  Results are pending, will review when available. EKG: Vent. rate 69 BPM PR interval 162 ms QRS duration 100 ms QT/QTcB 376/402 ms P-R-T axes 66 -10 74 Normal sinus rhythm Incomplete right bundle branch block Borderline ECG No significant change from previous tracing.  Assessment and Plan: Principal Problem:   Closed compression fracture of body of L1 vertebra (HCC) Case reviewed by neurosurgery. No need for any procedures. Observation/telemetry. Analgesics as needed. TLSO brace. Consult PT and OT.  Active Problems:   Hyperlipidemia associated with type 2 diabetes mellitus (HCC) Continue ezetimibe  10 mg p.o. daily.    Hypertension Hold Vasotec . Creatinine has increased today.    Type 2 diabetes mellitus with stage 3 chronic kidney disease (HCC) Carbohydrate modified diet. CBG monitoring with RI SS.    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 (HCC) Monitor renal function and electrolytes.    Parkinsonian features Supportive care. Follow-up with neurology as an outpatient.    Memory loss   Mild neurocognitive disorder Continue Namenda  XR 28 mg daily.    Neuropathy Continue gabapentin  400 mg p.o. twice daily.    Elevated troponin Check echocardiogram.    Advance Care Planning:   Code Status: Full Code   Consults:   Family Communication:   Severity of Illness: The appropriate patient status for this patient is OBSERVATION. Observation status is judged to be reasonable and necessary in order to provide the required intensity of service to ensure the patient's safety. The patient's  presenting symptoms, physical exam findings,  and initial radiographic and laboratory data in the context of their medical condition is felt to place them at decreased risk for further clinical deterioration. Furthermore, it is anticipated that the patient will be medically stable for discharge from the hospital within 2 midnights of admission.   Author: Alm Dorn Castor, MD 04/04/2024 7:44 AM  For on call review [email or web address].   This document was prepared using Dragon voice recognition software and may contain some unintended transcription errors.

## 2024-04-04 NOTE — ED Provider Notes (Addendum)
 Patient signed out pending had additional lab work and workup done yesterday because of ongoing confusion and difficulty walking.  TSH and ammonia normal.  Chemistry test at baseline.  Did have an MRI that shows a new L1 compression fracture with 25% height loss as well as a chronic T12 fracture with 50% height loss.  Will consult with neurosurgery.  6:05 AM Recommends follow-up with neurosurgery Marybelle) in 2 weeks.  TLSO in the meantime.  Patient unable to ambulate safely and needs pain control and PT.  6:26 AM Hospitalist to admit.   Bari Charmaine FALCON, MD 04/04/24 [id number]    Bari Charmaine FALCON, MD 04/04/24 [id number]    Bari Charmaine FALCON, MD 04/04/24 [phone]

## 2024-04-04 NOTE — ED Notes (Addendum)
 2 gold tops and 1 lavender top on ice sent to lab.

## 2024-04-05 ENCOUNTER — Observation Stay (HOSPITAL_COMMUNITY)

## 2024-04-05 DIAGNOSIS — R7989 Other specified abnormal findings of blood chemistry: Secondary | ICD-10-CM

## 2024-04-05 DIAGNOSIS — J302 Other seasonal allergic rhinitis: Secondary | ICD-10-CM | POA: Diagnosis present

## 2024-04-05 DIAGNOSIS — W19XXXA Unspecified fall, initial encounter: Secondary | ICD-10-CM

## 2024-04-05 DIAGNOSIS — E1122 Type 2 diabetes mellitus with diabetic chronic kidney disease: Secondary | ICD-10-CM | POA: Diagnosis present

## 2024-04-05 DIAGNOSIS — R251 Tremor, unspecified: Secondary | ICD-10-CM | POA: Diagnosis present

## 2024-04-05 DIAGNOSIS — S32010A Wedge compression fracture of first lumbar vertebra, initial encounter for closed fracture: Secondary | ICD-10-CM

## 2024-04-05 DIAGNOSIS — E559 Vitamin D deficiency, unspecified: Secondary | ICD-10-CM | POA: Diagnosis present

## 2024-04-05 DIAGNOSIS — Z79899 Other long term (current) drug therapy: Secondary | ICD-10-CM | POA: Diagnosis not present

## 2024-04-05 DIAGNOSIS — E7849 Other hyperlipidemia: Secondary | ICD-10-CM | POA: Diagnosis present

## 2024-04-05 DIAGNOSIS — Z8419 Family history of other disorders of kidney and ureter: Secondary | ICD-10-CM | POA: Diagnosis not present

## 2024-04-05 DIAGNOSIS — M109 Gout, unspecified: Secondary | ICD-10-CM | POA: Diagnosis present

## 2024-04-05 DIAGNOSIS — Z8249 Family history of ischemic heart disease and other diseases of the circulatory system: Secondary | ICD-10-CM | POA: Diagnosis not present

## 2024-04-05 DIAGNOSIS — Z880 Allergy status to penicillin: Secondary | ICD-10-CM | POA: Diagnosis not present

## 2024-04-05 DIAGNOSIS — M4854XA Collapsed vertebra, not elsewhere classified, thoracic region, initial encounter for fracture: Secondary | ICD-10-CM | POA: Diagnosis present

## 2024-04-05 DIAGNOSIS — I10 Essential (primary) hypertension: Secondary | ICD-10-CM | POA: Diagnosis not present

## 2024-04-05 DIAGNOSIS — E1142 Type 2 diabetes mellitus with diabetic polyneuropathy: Secondary | ICD-10-CM | POA: Diagnosis present

## 2024-04-05 DIAGNOSIS — S32018A Other fracture of first lumbar vertebra, initial encounter for closed fracture: Secondary | ICD-10-CM | POA: Diagnosis present

## 2024-04-05 DIAGNOSIS — M4856XA Collapsed vertebra, not elsewhere classified, lumbar region, initial encounter for fracture: Secondary | ICD-10-CM | POA: Diagnosis present

## 2024-04-05 DIAGNOSIS — N1832 Chronic kidney disease, stage 3b: Secondary | ICD-10-CM | POA: Diagnosis present

## 2024-04-05 DIAGNOSIS — R4701 Aphasia: Secondary | ICD-10-CM | POA: Diagnosis present

## 2024-04-05 DIAGNOSIS — I2489 Other forms of acute ischemic heart disease: Secondary | ICD-10-CM | POA: Diagnosis present

## 2024-04-05 DIAGNOSIS — T426X4A Poisoning by other antiepileptic and sedative-hypnotic drugs, undetermined, initial encounter: Secondary | ICD-10-CM | POA: Diagnosis not present

## 2024-04-05 DIAGNOSIS — G2581 Restless legs syndrome: Secondary | ICD-10-CM | POA: Diagnosis present

## 2024-04-05 DIAGNOSIS — N189 Chronic kidney disease, unspecified: Secondary | ICD-10-CM | POA: Diagnosis not present

## 2024-04-05 DIAGNOSIS — E1169 Type 2 diabetes mellitus with other specified complication: Secondary | ICD-10-CM | POA: Diagnosis present

## 2024-04-05 DIAGNOSIS — Z23 Encounter for immunization: Secondary | ICD-10-CM | POA: Diagnosis not present

## 2024-04-05 DIAGNOSIS — R4189 Other symptoms and signs involving cognitive functions and awareness: Secondary | ICD-10-CM | POA: Diagnosis present

## 2024-04-05 DIAGNOSIS — Z7982 Long term (current) use of aspirin: Secondary | ICD-10-CM | POA: Diagnosis not present

## 2024-04-05 DIAGNOSIS — I129 Hypertensive chronic kidney disease with stage 1 through stage 4 chronic kidney disease, or unspecified chronic kidney disease: Secondary | ICD-10-CM | POA: Diagnosis present

## 2024-04-05 LAB — ECHOCARDIOGRAM COMPLETE
AR max vel: 1.66 cm2
AV Area VTI: 1.76 cm2
AV Area mean vel: 1.68 cm2
AV Mean grad: 4 mmHg
AV Peak grad: 7.1 mmHg
Ao pk vel: 1.33 m/s
Area-P 1/2: 2.33 cm2
Height: 66 in
S' Lateral: 2.1 cm
Weight: 2440.93 [oz_av]

## 2024-04-05 LAB — BASIC METABOLIC PANEL WITH GFR
Anion gap: 13 (ref 5–15)
BUN: 31 mg/dL — ABNORMAL HIGH (ref 8–23)
CO2: 18 mmol/L — ABNORMAL LOW (ref 22–32)
Calcium: 9.2 mg/dL (ref 8.9–10.3)
Chloride: 108 mmol/L (ref 98–111)
Creatinine, Ser: 2.01 mg/dL — ABNORMAL HIGH (ref 0.61–1.24)
GFR, Estimated: 34 mL/min — ABNORMAL LOW (ref >60)
Glucose, Bld: 135 mg/dL — ABNORMAL HIGH (ref 70–99)
Potassium: 4.1 mmol/L (ref 3.5–5.1)
Sodium: 139 mmol/L (ref 135–145)

## 2024-04-05 LAB — MAGNESIUM: Magnesium: 2.2 mg/dL (ref 1.7–2.4)

## 2024-04-05 MED ORDER — DIPHENHYDRAMINE HCL 25 MG PO CAPS
25.0000 mg | ORAL_CAPSULE | Freq: Once | ORAL | Status: AC
  Administered 2024-04-05: 25 mg via ORAL
  Filled 2024-04-05: qty 1

## 2024-04-05 MED ORDER — SODIUM CHLORIDE 0.9 % IV SOLN: INTRAVENOUS | Status: DC

## 2024-04-05 MED ORDER — POLYETHYLENE GLYCOL 3350 17 G PO PACK
17.0000 g | PACK | Freq: Every day | ORAL | Status: DC | PRN
  Administered 2024-04-05 – 2024-04-07 (×2): 17 g via ORAL
  Filled 2024-04-05 (×2): qty 1

## 2024-04-05 NOTE — Progress Notes (Signed)
 PROGRESS NOTE    [name]  FMW:[id number] DOB: [birth year] DOA: 04/02/2024 PCP: Aletha Bene, MD   Brief Narrative:  73 y.o. male with medical history significant of stage III CKD, type 2 diabetes, diabetic polyneuropathy, gout, hyperlipidemia, hypertension, hypogonadism, mild cognitive impairment, restless syndrome, parkinsonian features, tremor, vitamin D  deficiency presented with altered mental status and abnormal gait.  On presentation, patient was confused.  Troponin was 56, 59, 52 and 57.  Creatinine of 1.88.  Ammonia was 16.  MRI of brain without contrast did not show any acute intracranial abnormality.  MRI of lumbar spine showed acute compression fracture of L1 superior endplate with up to 35% height loss, no significant retropulsion; chronic compression fracture of T12; no significant spinal stenosis.  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  ED provider discussed with neurosurgery on-call who recommended conservative management and outpatient follow-up with neurosurgery.  Assessment & Plan:   Acute closed compression fracture of L1 vertebra Chronic compression fracture of T12 Back pain - Imaging as above. ED provider discussed with neurosurgery on-call who recommended conservative management and outpatient follow-up with neurosurgery. - Continue TLSO brace. -PT/OT recommending SNF placement.  Consult TOC  Hypertension Hyperlipidemia -continue ezetimibe .  Antihypertensives on hold for now because of worsening kidney function.  CKD stage IIIb -Baseline creatinine of 1.5-1.8.  Creatinine slightly elevated at 2.01 today.  Start gentle hydration.  Repeat a.m. labs  Diabetes mellitus type 2 - Blood sugars stable.  Continue carb modified diet  Parkinsonian features  - Continue supportive care.  Outpatient follow-up with neurology  Mild neurocognitive  disorder - Continue memantine   Neuropathy -Continue gabapentin   Elevated troponin - Possible from demand ischemia.  Troponins did not trend upwards.  No chest pain.  EF of 55 to 60% with grade 1 diastolic dysfunction   DVT prophylaxis: SCDs Code Status: Full Family Communication: None at bedside Disposition Plan: Status is: Observation The patient will require care spanning > 2 midnights and should be moved to inpatient because: Of severity of illness.  Need for SNF placement    Consultants: None.  ED provider curbsided with neurosurgery on-call Procedures: None  Antimicrobials: None   Subjective: Patient seen and examined at bedside.  Continues to have lower back pain.  Slow to respond.  Poor historian.  No fever or vomiting reported.  Objective: Vitals:   04/04/24 2024 04/05/24 0600 04/05/24 0623 04/05/24 1105  BP: (!) 154/73  121/72 122/70  Pulse: 75  71 70  Resp: 18  16 18   Temp: 97.8 F (36.6 C)  97.9 F (36.6 C) 97.9 F (36.6 C)  TempSrc: Oral  Oral Oral  SpO2: 99%  97% 98%  Weight: 69.2 kg 69.2 kg    Height: 5' 6 (1.676 m)       Intake/Output Summary (Last 24 hours) at 04/05/2024 1151 Last data filed at 04/05/2024 0622 Gross per 24 hour  Intake 1800 ml  Output 600 ml  Net 1200 ml   Filed Weights   04/02/24 0830 04/04/24 2024 04/05/24 0600  Weight: 67.1 kg 69.2 kg 69.2 kg    Examination:  General exam: Appears calm and comfortable.  Elderly male lying in bed. Respiratory system: Bilateral decreased breath sounds at bases with scattered crackles Cardiovascular system: S1 & S2 heard, Rate controlled Gastrointestinal system: Abdomen is nondistended, soft and nontender. Normal bowel sounds heard. Extremities: No cyanosis, clubbing, edema  Central nervous system: Awake, slow to respond, answers some questions.  No focal neurological deficits. Moving extremities Skin: No rashes, lesions or ulcers Psychiatry: Flat affect.  Not agitated.    Data  Reviewed: I have personally reviewed following labs and imaging studies  CBC: Recent Labs  Lab 04/02/24 0841 04/03/24 1453 04/04/24 1555  WBC 8.4 8.4 6.8  NEUTROABS 6.4 6.3  --   HGB 15.9 16.2 15.7  HCT 49.7 48.2 48.5  MCV 85.2 82.7 84.3  PLT 295 303 273   Basic Metabolic Panel: Recent Labs  Lab 04/02/24 0841 04/03/24 1453 04/04/24 1555 04/05/24 0850  NA 142 138 139 139  K 4.0 3.9 3.9 4.1  CL 106 104 105 108  CO2 22 18* 20* 18*  GLUCOSE 126* 145* 167* 135*  BUN 25* 26* 28* 31*  CREATININE 1.88* 1.69* 2.00* 2.01*  CALCIUM  10.0 10.1 9.4 9.2  MG  --   --  2.1 2.2  PHOS  --   --  3.2  --    GFR: Estimated Creatinine Clearance: 29.5 mL/min (A) (by C-G formula based on SCr of 2.01 mg/dL (H)). Liver Function Tests: Recent Labs  Lab 04/04/24 1555  AST 22  ALT 13  ALKPHOS 181*  BILITOT 0.5  PROT 6.5  ALBUMIN 4.1   No results for input(s): LIPASE, AMYLASE in the last 168 hours. Recent Labs  Lab 04/04/24 0026  AMMONIA 16   Coagulation Profile: No results for input(s): INR, PROTIME in the last 168 hours. Cardiac Enzymes: No results for input(s): CKTOTAL, CKMB, CKMBINDEX, TROPONINI in the last 168 hours. BNP (last 3 results) No results for input(s): PROBNP in the last 8760 hours. HbA1C: No results for input(s): HGBA1C in the last 72 hours. CBG: No results for input(s): GLUCAP in the last 168 hours. Lipid Profile: No results for input(s): CHOL, HDL, LDLCALC, TRIG, CHOLHDL, LDLDIRECT in the last 72 hours. Thyroid  Function Tests: Recent Labs    04/04/24 0026  TSH 2.570  FREET4 1.19*   Anemia Panel: No results for input(s): VITAMINB12, FOLATE, FERRITIN, TIBC, IRON, RETICCTPCT in the last 72 hours. Sepsis Labs: No results for input(s): PROCALCITON, LATICACIDVEN in the last 168 hours.  No results found for this or any previous visit (from the past 240 hours).       Radiology Studies: ECHOCARDIOGRAM  COMPLETE Result Date: 04/05/2024    ECHOCARDIOGRAM REPORT   Patient Name:   TAMI BARREN Date of Exam: 04/05/2024 Medical Rec #:  [id number]   Height:       66.0 in Accession #:    [id number]  Weight:       152.6 lb Date of Birth:  05-12-1951   BSA:          1.782 m Patient Age:    73 years    BP:           121/72 mmHg Patient Gender: M           HR:           79 bpm. Exam Location:  Inpatient Procedure: 2D Echo, Cardiac Doppler and Color Doppler (Both Spectral and Color            Flow Doppler were utilized during procedure). Indications:    Elevated Troponin  History:        Patient has prior history of Echocardiogram examinations and                 Patient has no prior history of Echocardiogram examinations.  Risk Factors:Hypertension and Diabetes.  Sonographer:    Jayson Gaskins Referring Phys: [id number] DAVID MANUEL ORTIZ IMPRESSIONS  1. Left ventricular ejection fraction, by estimation, is 55 to 60%. The left ventricle has normal function. The left ventricle has no regional wall motion abnormalities. Left ventricular diastolic parameters are consistent with Grade I diastolic dysfunction (impaired relaxation).  2. Right ventricular systolic function is normal. The right ventricular size is normal.  3. The mitral valve is normal in structure. No evidence of mitral valve regurgitation.  4. The aortic valve is tricuspid. Aortic valve regurgitation is not visualized. Aortic valve sclerosis is present, with no evidence of aortic valve stenosis.  5. The inferior vena cava is normal in size with greater than 50% respiratory variability, suggesting right atrial pressure of 3 mmHg. Comparison(s): No prior Echocardiogram. FINDINGS  Left Ventricle: Left ventricular ejection fraction, by estimation, is 55 to 60%. The left ventricle has normal function. The left ventricle has no regional wall motion abnormalities. The left ventricular internal cavity size was normal in size. There is  no left ventricular  hypertrophy. Left ventricular diastolic parameters are consistent with Grade I diastolic dysfunction (impaired relaxation). Right Ventricle: The right ventricular size is normal. No increase in right ventricular wall thickness. Right ventricular systolic function is normal. Left Atrium: Left atrial size was normal in size. Right Atrium: Right atrial size was normal in size. Pericardium: There is no evidence of pericardial effusion. Mitral Valve: The mitral valve is normal in structure. No evidence of mitral valve regurgitation. Tricuspid Valve: The tricuspid valve is grossly normal. Tricuspid valve regurgitation is trivial. Aortic Valve: The aortic valve is tricuspid. Aortic valve regurgitation is not visualized. Aortic valve sclerosis is present, with no evidence of aortic valve stenosis. Aortic valve mean gradient measures 4.0 mmHg. Aortic valve peak gradient measures 7.1  mmHg. Aortic valve area, by VTI measures 1.76 cm. Pulmonic Valve: The pulmonic valve was grossly normal. Pulmonic valve regurgitation is trivial. Aorta: The aortic root is normal in size and structure. Venous: The inferior vena cava is normal in size with greater than 50% respiratory variability, suggesting right atrial pressure of 3 mmHg. IAS/Shunts: The interatrial septum was not well visualized.  LEFT VENTRICLE PLAX 2D LVIDd:         3.70 cm   Diastology LVIDs:         2.10 cm   LV e' medial:    5.98 cm/s LV PW:         0.90 cm   LV E/e' medial:  9.5 LV IVS:        0.80 cm   LV e' lateral:   6.74 cm/s LVOT diam:     1.90 cm   LV E/e' lateral: 8.4 LV SV:         41 LV SV Index:   23 LVOT Area:     2.84 cm  RIGHT VENTRICLE RV Basal diam:  3.50 cm RV Mid diam:    3.00 cm LEFT ATRIUM             Index        RIGHT ATRIUM           Index LA Vol (A2C):   27.6 ml 15.48 ml/m  RA Area:     15.10 cm LA Vol (A4C):   21.9 ml 12.29 ml/m  RA Volume:   37.70 ml  21.15 ml/m LA Biplane Vol: 26.1 ml 14.64 ml/m  AORTIC VALVE AV Area (Vmax):    1.66 cm  AV Area (Vmean):   1.68 cm AV Area (VTI):     1.76 cm AV Vmax:           133.00 cm/s AV Vmean:          97.400 cm/s AV VTI:            0.231 m AV Peak Grad:      7.1 mmHg AV Mean Grad:      4.0 mmHg LVOT Vmax:         77.70 cm/s LVOT Vmean:        57.700 cm/s LVOT VTI:          0.143 m LVOT/AV VTI ratio: 0.62  AORTA Ao Root diam: 2.60 cm MITRAL VALVE MV Area (PHT): 2.33 cm    SHUNTS MV Decel Time: 325 msec    Systemic VTI:  0.14 m MV E velocity: 56.60 cm/s  Systemic Diam: 1.90 cm MV A velocity: 72.80 cm/s MV E/A ratio:  0.78 Emeline Calender Electronically signed by Emeline Calender Signature Date/Time: 04/05/2024/10:10:09 AM    Final    MR LUMBAR SPINE WO CONTRAST Result Date: 04/03/2024 EXAM: MRI LUMBAR SPINE 04/03/2024 06:54:00 PM TECHNIQUE: Multiplanar multisequence MRI of the lumbar spine was performed without the administration of intravenous contrast. COMPARISON: Comparison to prior MRI from 03/17/2023. CLINICAL HISTORY: Low back pain, trauma. Neuro deficit, acute, stroke suspected. Low back pain, trauma. FINDINGS: BONES AND ALIGNMENT: Normal alignment. Acute compression fracture involving the superior endplate of L1 associated height loss measures up to 35% without significant retropulsion. This is benign/mechanical in appearance. Chronic compression fracture of T12 with up to 50% height loss and trace 2 mm bony retropulsion. Vertebral body height otherwise maintained with no other acute or chronic fracture. Bone marrow signal intensity within normal limits. Probable small atypical hemangioma noted within the S2 segment. No worrisome osseous lesions. Trace facet mediastinal osseous L5 on S1. Low waveform disc space labeled the L5 S1 level. SPINAL CORD: The conus medullaris terminates at the level of L1. SOFT TISSUES: Paraspinal soft tissues demonstrate no acute findings. Few scattered T2 hyperintense cysts noted about the visualized kidneys, benign in appearance, no follow up imaging recommended. T11-T12: Mild  disc bulge with 2 mm bony retropulsion related to the chronic T12 fracture. No significant spinal stenosis. Foramina appear grossly patent. T12-L1: Minimal disc bulge superimposed left foraminal disc protrusion (series 12, image 7). No spinal stenosis. Mild bilateral foraminal narrowing. L1-L2: Mild disc bulge superimposed left foraminal and extraforaminal disc protrusion closely approximates to the left L1 nerve root. No spinal stenosis. Mild bilateral foraminal narrowing. L2-L3: Mild disc bulge with endplate spurring. Mild facet hypertrophy. Result in mild narrowing of the right lateral recess. Central canal remains patent. Mild bilateral L2 foraminal narrowing. L3-L4: Mild disc bulge with endplate spurring. Superimposed left foraminal to extraforaminal disc protrusion (series 13, image 28). Mild bilateral facet spurring. No spinal stenosis. Moderate left with mild right L3 foraminal narrowing. L4-L5: Mild disc bulge. Mild endplate spurring. Mild bilateral facet hypertrophy. No significant spinal stenosis. Mild right with moderate left L4 foraminal stenosis. L5-S1: Negative interspace. Moderate right worse than left facet hypertrophy. Resultant mild bilateral subarticular stenosis. Central canal remains patent. No significant foraminal stenosis. IMPRESSION: 1. Acute compression fracture of L1 superior endplate with up to 35% height loss, but with no significant retropulsion. 2. Chronic compression fracture of T12 with up to 50% height loss and trace (2 mm) bony retropulsion. 3. Underlying multilevel lumbar spondylosis without significant spinal stenosis. . Mild to  moderate multilevel foraminal narrowing as above. Electronically signed by: Morene Hoard MD 04/03/2024 07:18 PM EDT RP Workstation: [id number]   MR BRAIN WO CONTRAST Result Date: 04/03/2024 EXAM: MRI BRAIN WITHOUT CONTRAST 04/03/2024 06:54:00 PM TECHNIQUE: Multiplanar multisequence MRI of the head/brain was performed without the administration  of intravenous contrast. COMPARISON: Comparison with prior CT from 04/02/2024. CLINICAL HISTORY: Neuro deficit, acute, stroke suspected. Neuro deficit, acute, stroke suspected. Low back pain, trauma. Neuro deficit, acute, stroke suspected. Neuro deficit, acute, stroke suspected. Low back pain, trauma. FINDINGS: BRAIN AND VENTRICLES: No acute infarct. No intracranial hemorrhage. No mass. No midline shift. No hydrocephalus. Mild age related cerebral atrophy with chronic small vessel ischemic disease. Few small remote lacunar infarcts present about the bilateral corona radiata/basal ganglia. The sella is unremarkable. Normal flow voids. ORBITS: Prior oculolens replacement on the left. No acute abnormality. SINUSES AND MASTOIDS: No acute abnormality. BONES AND SOFT TISSUES: Normal marrow signal. No acute soft tissue abnormality. IMPRESSION: 1. No acute intracranial abnormality. 2. Mild age-related cerebral atrophy with chronic small vessel ischemic disease. Electronically signed by: Morene Hoard MD 04/03/2024 07:10 PM EDT RP Workstation: [id number]        Scheduled Meds:  aspirin EC  81 mg Oral Daily   cholecalciferol  5,000 Units Oral Daily   ezetimibe   10 mg Oral Daily   gabapentin   400 mg Oral BID   lidocaine   1 patch Transdermal Q24H   memantine   28 mg Oral Daily   tamsulosin   0.4 mg Oral Daily   Continuous Infusions:        Sophie Mao, MD [facility] 04/05/2024, 11:51 AM

## 2024-04-05 NOTE — TOC Initial Note (Addendum)
 Transition of Care [facility]) - Initial/Assessment Note    Patient Details  Name: [name] MRN: [id number] Date of Birth: [birth year]  [facility] CM/SW Contact:    Sonda Manuella Quill, RN Phone Number: 04/05/2024, 2:55 PM  Clinical Narrative:                 IP CM for d/c planning; PT recc SNF; spoke w/ pt and brother Arley in room; pt said he lives at home w/ his wife Isom Kochan [phone]); they plan for d/c to SNF; transportation by [facility]; pt verified insurance/PCP; he denied SDOH risks; pt and brother said he does not have DME; he has HHPT w/ [facility]; pt previously sent out for bed offers; attempted to contact his wife to discuss; unable to LVM at number listed; message said VM full; list of bed offers left in pt's room on duffle bag: [address], [place], [address], [address] and [place], and [facility]; awaiting choice; ins auth needed.  [phone] spoke w/ pt's wife; she was given name of facilities that have offered beds, and ins auth needed; she was also notified that list of bed offers left on pt's duffle bag in room; Ms Lumbert verbalized understanding; will follow up w/ pt/wife for bed choice.  Expected Discharge Plan: Skilled Nursing Facility Barriers to Discharge: Continued Medical Work up   Patient Goals and CMS Choice Patient states their goals for this hospitalization and ongoing recovery are:: snf CMS Medicare.gov Compare Post Acute Care list provided to:: Other (Comment Required) Ismar Yabut (brother))   [place] ownership interest in [facility].provided to:: Sibling    Expected Discharge Plan and Services   Discharge Planning Services: CM Consult   Living arrangements for the past 2 months: Single Family Home                 DME Arranged: N/A DME Agency: NA       HH Arranged: NA HH Agency: NA Date HH Agency Contacted: 04/02/24   Representative spoke with at [facility] Agency: [place]  Prior Living  Arrangements/Services Living arrangements for the past 2 months: Single Family Home Lives with:: Spouse Patient language and need for interpreter reviewed:: Yes Do you feel safe going back to the place where you live?: Yes      Need for Family Participation in Patient Care: Yes (Comment) Care giver support system in place?: Yes (comment) Current home services: Home PT (HHPT w/ [facility]) Criminal Activity/Legal Involvement Pertinent to Current Situation/Hospitalization: No - Comment as needed  Activities of Daily Living   ADL Screening (condition at time of admission) Independently performs ADLs?: Yes (appropriate for developmental age) Is the patient deaf or have difficulty hearing?: No Does the patient have difficulty seeing, even when wearing glasses/contacts?: No Does the patient have difficulty concentrating, remembering, or making decisions?: Yes  Permission Sought/Granted Permission sought to share information with : Case Manager Permission granted to share information with : Yes, Verbal Permission Granted  Share Information with NAME: Case Manager     Permission granted to share info w Relationship: Kalum Minner (spouse) [phone]     Emotional Assessment Appearance:: Appears stated age Attitude/Demeanor/Rapport: Gracious Affect (typically observed): Accepting Orientation: : Oriented to Self, Oriented to Place, Oriented to  Time, Oriented to Situation Alcohol / Substance Use: Not Applicable Psych Involvement: No (comment)  Admission diagnosis:  Fall, initial encounter [W19.XXXA] Closed compression fracture of body of L1 vertebra (HCC) [S32.010A] Closed compression fracture of L1 vertebra, initial encounter (  HCC) [S32.010A] Fall at home, initial encounter [W19.CHERENE, Y92.009] Patient Active Problem List   Diagnosis Date Noted   Fall at home, initial encounter 04/05/2024   Closed compression fracture of body of L1 vertebra (HCC) 04/04/2024   Elevated troponin  04/04/2024   Aphasia 09/28/2023   Memory loss 09/28/2023   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   Neuropathy 09/28/2023   Tremor 09/28/2023   Chronic bilateral low back pain with right-sided sciatica 03/15/2023   Gait abnormality 03/15/2023   Mild neurocognitive disorder 11/04/2022   Parkinsonian features 09/06/2022   RLS (restless legs syndrome) 09/06/2022   MCI (mild cognitive impairment) 09/06/2022   Senile purpura 01/08/2020   Statin myopathy 12/19/2019   Diabetes, polyneuropathy (HCC) 12/17/2018   Statin intolerance 12/17/2018   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 (HCC) 07/18/2017   Vitamin D  deficiency 08/29/2013   Medication management 08/29/2013   Hyperlipidemia associated with type 2 diabetes mellitus (HCC)    Hypertension    Type 2 diabetes mellitus with stage 3 chronic kidney disease (HCC)    Testosterone  deficiency    Gout    PCP:  Aletha Bene, MD Pharmacy:   [facility] [id number] - [place], [place] - [address] [place] [address] Phone: [phone] Fax: [phone]     Social Drivers of Health (SDOH) Social History: SDOH Screenings   Food Insecurity: No Food Insecurity (04/05/2024)  Housing: Low Risk  (04/05/2024)  Transportation Needs: No Transportation Needs (04/05/2024)  Utilities: Not At Risk (04/05/2024)  Depression (PHQ2-9): Low Risk  (04/12/2023)  Financial Resource Strain: Low Risk  (09/28/2023)   Received from [facility]  Social Connections: Moderately Integrated (04/04/2024)  Tobacco Use: Low Risk  (04/04/2024)   SDOH Interventions: Food Insecurity Interventions: Intervention Not Indicated, Inpatient TOC Housing Interventions: Intervention Not Indicated, Inpatient TOC Transportation Interventions: Intervention Not Indicated, Inpatient TOC Utilities Interventions: Intervention Not Indicated, Inpatient TOC   Readmission Risk Interventions     No data to display
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
## 2024-04-05 NOTE — Plan of Care (Signed)
  Problem: Safety: Goal: Ability to remain free from injury will improve Outcome: Progressing   Problem: Pain Managment: Goal: General experience of comfort will improve and/or be controlled Outcome: Progressing   Problem: Elimination: Goal: Will not experience complications related to urinary retention Outcome: Progressing   Problem: Coping: Goal: Level of anxiety will decrease Outcome: Progressing

## 2024-04-05 NOTE — Care Management Obs Status (Signed)
 MEDICARE OBSERVATION STATUS NOTIFICATION   Patient Details  Name: Nik Gorrell MRN: [id number] Date of Birth: [birth year]   Medicare Observation Status Notification Given:  Yes    Sonda Manuella Quill, RN 04/05/2024, 12:35 PM

## 2024-04-05 NOTE — Plan of Care (Signed)
  Problem: Clinical Measurements: Goal: Ability to maintain clinical measurements within normal limits will improve Outcome: Progressing   Problem: Activity: Goal: Risk for activity intolerance will decrease Outcome: Progressing   Problem: Nutrition: Goal: Adequate nutrition will be maintained Outcome: Progressing   Problem: Pain Managment: Goal: General experience of comfort will improve and/or be controlled Outcome: Progressing

## 2024-04-05 NOTE — Progress Notes (Signed)
 Orthopedic Tech Progress Note Patient Details:  [name] 21-Apr-1951 [id number] TLSO was sized and delivered to bedside. Patient was given instructions on how to properly apply and remove the brace when needed.  Ortho Devices Type of Ortho Device: Thoracolumbar corset (TLSO) Ortho Device/Splint Interventions: Ordered, Adjustment   Post Interventions Instructions Provided: Adjustment of device  Kailany Dinunzio E Morayma [place] 04/05/2024, 9:49 AM

## 2024-04-06 DIAGNOSIS — S32010A Wedge compression fracture of first lumbar vertebra, initial encounter for closed fracture: Secondary | ICD-10-CM | POA: Diagnosis not present

## 2024-04-06 LAB — MAGNESIUM: Magnesium: 2.1 mg/dL (ref 1.7–2.4)

## 2024-04-06 LAB — BASIC METABOLIC PANEL WITH GFR
Anion gap: 10 (ref 5–15)
BUN: 29 mg/dL — ABNORMAL HIGH (ref 8–23)
CO2: 23 mmol/L (ref 22–32)
Calcium: 9 mg/dL (ref 8.9–10.3)
Chloride: 109 mmol/L (ref 98–111)
Creatinine, Ser: 1.87 mg/dL — ABNORMAL HIGH (ref 0.61–1.24)
GFR, Estimated: 37 mL/min — ABNORMAL LOW (ref >60)
Glucose, Bld: 101 mg/dL — ABNORMAL HIGH (ref 70–99)
Potassium: 3.9 mmol/L (ref 3.5–5.1)
Sodium: 142 mmol/L (ref 135–145)

## 2024-04-06 MED ORDER — PRIMIDONE 50 MG PO TABS
50.0000 mg | ORAL_TABLET | Freq: Every day | ORAL | Status: DC
  Administered 2024-04-06 – 2024-04-08 (×3): 50 mg via ORAL
  Filled 2024-04-06 (×3): qty 1

## 2024-04-06 NOTE — Plan of Care (Signed)
   Problem: Health Behavior/Discharge Planning: Goal: Ability to manage health-related needs will improve Outcome: Progressing   Problem: Clinical Measurements: Goal: Ability to maintain clinical measurements within normal limits will improve Outcome: Progressing Goal: Will remain free from infection Outcome: Progressing Goal: Diagnostic test results will improve Outcome: Progressing Goal: Respiratory complications will improve Outcome: Progressing Goal: Cardiovascular complication will be avoided Outcome: Progressing   Problem: Activity: Goal: Risk for activity intolerance will decrease Outcome: Progressing   Problem: Nutrition: Goal: Adequate nutrition will be maintained Outcome: Progressing   Problem: Coping: Goal: Level of anxiety will decrease Outcome: Progressing   Problem: Elimination: Goal: Will not experience complications related to bowel motility Outcome: Progressing   Problem: Pain Managment: Goal: General experience of comfort will improve and/or be controlled Outcome: Progressing   Problem: Safety: Goal: Ability to remain free from injury will improve Outcome: Progressing   Problem: Skin Integrity: Goal: Risk for impaired skin integrity will decrease Outcome: Progressing

## 2024-04-06 NOTE — Plan of Care (Signed)

## 2024-04-06 NOTE — Progress Notes (Signed)
 PROGRESS NOTE    [name]  FMW:[id number] DOB: [birth year] DOA: 04/02/2024 PCP: Wayne Bene, MD   Brief Narrative:  73 y.o. male with medical history significant of stage III CKD, type 2 diabetes, diabetic polyneuropathy, gout, hyperlipidemia, hypertension, hypogonadism, mild cognitive impairment, restless syndrome, parkinsonian features, tremor, vitamin D  deficiency presented with altered mental status and abnormal gait.  On presentation, patient was confused.  Troponin was 56, 59, 52 and 57.  Creatinine of 1.88.  Ammonia was 16.  MRI of brain without contrast did not show any acute intracranial abnormality.  MRI of lumbar spine showed acute compression fracture of L1 superior endplate with up to 35% height loss, no significant retropulsion; chronic compression fracture of T12; no significant spinal stenosis.  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  ED provider discussed with neurosurgery on-call who recommended conservative management and outpatient follow-up with neurosurgery.  Assessment & Plan:   Acute closed compression fracture of L1 vertebra Chronic compression fracture of T12 Back pain - Imaging as above. ED provider discussed with neurosurgery on-call who recommended conservative management and outpatient follow-up with neurosurgery. - Continue TLSO brace. -PT/OT recommending SNF placement.  TOC following  Hypertension Hyperlipidemia -continue ezetimibe .  Antihypertensives on hold for now because of worsening kidney function.  CKD stage IIIb -Baseline creatinine of 1.5-1.8.  Creatinine improving to 1.87 today.  Encourage oral intake.  DC IV fluids.  Repeat a.m. labs  Diabetes mellitus type 2 - Blood sugars stable.  Continue carb modified diet  Parkinsonian features  - Continue supportive care.  Outpatient follow-up with neurology  Mild neurocognitive  disorder - Continue memantine   Neuropathy -Continue gabapentin   Elevated troponin - Possible from demand ischemia.  Troponins did not trend upwards.  No chest pain.  EF of 55 to 60% with grade 1 diastolic dysfunction   DVT prophylaxis: SCDs Code Status: Full Family Communication: None at bedside Disposition Plan: Status is: inpatient because: Of severity of illness.  Need for SNF placement.  Currently medically stable for discharge to rehab    Consultants: None.  ED provider curbsided with neurosurgery on-call Procedures: None  Antimicrobials: None   Subjective: Patient seen and examined at bedside.  Poor historian.  Continues to have intermittent lower back pain.  No agitation, fever, seizures reported. Objective: Vitals:   04/05/24 1324 04/05/24 2206 04/06/24 0500 04/06/24 0631  BP: (!) 122/96 138/83  132/86  Pulse: 72 60  (!) 59  Resp: 18 16  15   Temp: (!) 97.5 F (36.4 C) 97.9 F (36.6 C)  97.6 F (36.4 C)  TempSrc: Oral   Oral  SpO2: 100% 99%  100%  Weight:   70.4 kg   Height:        Intake/Output Summary (Last 24 hours) at 04/06/2024 0854 Last data filed at 04/06/2024 0600 Gross per 24 hour  Intake 1528.75 ml  Output 920 ml  Net 608.75 ml   Filed Weights   04/04/24 2024 04/05/24 0600 04/06/24 0500  Weight: 69.2 kg 69.2 kg 70.4 kg    Examination:  General: On room air.  No distress ENT/neck: No thyromegaly.  JVD is not elevated  respiratory: Decreased breath sounds at bases bilaterally with some crackles; no wheezing  CVS: S1-S2 heard, rate controlled currently Abdominal: Soft, nontender, slightly distended; no organomegaly, bowel sounds are heard Extremities: Trace lower extremity edema; no cyanosis  CNS: Awake and alert.  Remains slow to respond and a poor historian.  No focal neurologic deficit.  Moves extremities Lymph: No obvious lymphadenopathy Skin: No obvious ecchymosis/lesions  psych: Mostly flat affect.  Not agitated currently.    Musculoskeletal: No obvious joint swelling/deformity     Data Reviewed: I have personally reviewed following labs and imaging studies  CBC: Recent Labs  Lab 04/02/24 0841 04/03/24 1453 04/04/24 1555  WBC 8.4 8.4 6.8  NEUTROABS 6.4 6.3  --   HGB 15.9 16.2 15.7  HCT 49.7 48.2 48.5  MCV 85.2 82.7 84.3  PLT 295 303 273   Basic Metabolic Panel: Recent Labs  Lab 04/02/24 0841 04/03/24 1453 04/04/24 1555 04/05/24 0850 04/06/24 0356  NA 142 138 139 139 142  K 4.0 3.9 3.9 4.1 3.9  CL 106 104 105 108 109  CO2 22 18* 20* 18* 23  GLUCOSE 126* 145* 167* 135* 101*  BUN 25* 26* 28* 31* 29*  CREATININE 1.88* 1.69* 2.00* 2.01* 1.87*  CALCIUM  10.0 10.1 9.4 9.2 9.0  MG  --   --  2.1 2.2 2.1  PHOS  --   --  3.2  --   --    GFR: Estimated Creatinine Clearance: 31.7 mL/min (A) (by C-G formula based on SCr of 1.87 mg/dL (H)). Liver Function Tests: Recent Labs  Lab 04/04/24 1555  AST 22  ALT 13  ALKPHOS 181*  BILITOT 0.5  PROT 6.5  ALBUMIN 4.1   No results for input(s): LIPASE, AMYLASE in the last 168 hours. Recent Labs  Lab 04/04/24 0026  AMMONIA 16   Coagulation Profile: No results for input(s): INR, PROTIME in the last 168 hours. Cardiac Enzymes: No results for input(s): CKTOTAL, CKMB, CKMBINDEX, TROPONINI in the last 168 hours. BNP (last 3 results) No results for input(s): PROBNP in the last 8760 hours. HbA1C: No results for input(s): HGBA1C in the last 72 hours. CBG: No results for input(s): GLUCAP in the last 168 hours. Lipid Profile: No results for input(s): CHOL, HDL, LDLCALC, TRIG, CHOLHDL, LDLDIRECT in the last 72 hours. Thyroid  Function Tests: Recent Labs    04/04/24 0026  TSH 2.570  FREET4 1.19*   Anemia Panel: No results for input(s): VITAMINB12, FOLATE, FERRITIN, TIBC, IRON, RETICCTPCT in the last 72 hours. Sepsis Labs: No results for input(s): PROCALCITON, LATICACIDVEN in the last 168  hours.  No results found for this or any previous visit (from the past 240 hours).       Radiology Studies: ECHOCARDIOGRAM COMPLETE Result Date: 04/05/2024    ECHOCARDIOGRAM REPORT   Patient Name:   [name] Date of Exam: 04/05/2024 Medical Rec #:  [id number]   Height:       66.0 in Accession #:    [id number]  Weight:       152.6 lb Date of Birth:  April 16, 1951   BSA:          1.782 m Patient Age:    73 years    BP:           121/72 mmHg Patient Gender: M           HR:           79 bpm. Exam Location:  Inpatient Procedure: 2D Echo, Cardiac Doppler and Color Doppler (Both Spectral and Color            Flow Doppler were utilized during procedure). Indications:    Elevated Troponin  History:        Patient has prior history of Echocardiogram  examinations and                 Patient has no prior history of Echocardiogram examinations.                 Risk Factors:Hypertension and Diabetes.  Sonographer:    Jayson Gaskins Referring Phys: [id number] DAVID MANUEL ORTIZ IMPRESSIONS  1. Left ventricular ejection fraction, by estimation, is 55 to 60%. The left ventricle has normal function. The left ventricle has no regional wall motion abnormalities. Left ventricular diastolic parameters are consistent with Grade I diastolic dysfunction (impaired relaxation).  2. Right ventricular systolic function is normal. The right ventricular size is normal.  3. The mitral valve is normal in structure. No evidence of mitral valve regurgitation.  4. The aortic valve is tricuspid. Aortic valve regurgitation is not visualized. Aortic valve sclerosis is present, with no evidence of aortic valve stenosis.  5. The inferior vena cava is normal in size with greater than 50% respiratory variability, suggesting right atrial pressure of 3 mmHg. Comparison(s): No prior Echocardiogram. FINDINGS  Left Ventricle: Left ventricular ejection fraction, by estimation, is 55 to 60%. The left ventricle has normal function. The left ventricle has no  regional wall motion abnormalities. The left ventricular internal cavity size was normal in size. There is  no left ventricular hypertrophy. Left ventricular diastolic parameters are consistent with Grade I diastolic dysfunction (impaired relaxation). Right Ventricle: The right ventricular size is normal. No increase in right ventricular wall thickness. Right ventricular systolic function is normal. Left Atrium: Left atrial size was normal in size. Right Atrium: Right atrial size was normal in size. Pericardium: There is no evidence of pericardial effusion. Mitral Valve: The mitral valve is normal in structure. No evidence of mitral valve regurgitation. Tricuspid Valve: The tricuspid valve is grossly normal. Tricuspid valve regurgitation is trivial. Aortic Valve: The aortic valve is tricuspid. Aortic valve regurgitation is not visualized. Aortic valve sclerosis is present, with no evidence of aortic valve stenosis. Aortic valve mean gradient measures 4.0 mmHg. Aortic valve peak gradient measures 7.1  mmHg. Aortic valve area, by VTI measures 1.76 cm. Pulmonic Valve: The pulmonic valve was grossly normal. Pulmonic valve regurgitation is trivial. Aorta: The aortic root is normal in size and structure. Venous: The inferior vena cava is normal in size with greater than 50% respiratory variability, suggesting right atrial pressure of 3 mmHg. IAS/Shunts: The interatrial septum was not well visualized.  LEFT VENTRICLE PLAX 2D LVIDd:         3.70 cm   Diastology LVIDs:         2.10 cm   LV e' medial:    5.98 cm/s LV PW:         0.90 cm   LV E/e' medial:  9.5 LV IVS:        0.80 cm   LV e' lateral:   6.74 cm/s LVOT diam:     1.90 cm   LV E/e' lateral: 8.4 LV SV:         41 LV SV Index:   23 LVOT Area:     2.84 cm  RIGHT VENTRICLE RV Basal diam:  3.50 cm RV Mid diam:    3.00 cm LEFT ATRIUM             Index        RIGHT ATRIUM           Index LA Vol (A2C):   27.6 ml 15.48 ml/m  RA Area:  15.10 cm LA Vol (A4C):   21.9 ml  12.29 ml/m  RA Volume:   37.70 ml  21.15 ml/m LA Biplane Vol: 26.1 ml 14.64 ml/m  AORTIC VALVE AV Area (Vmax):    1.66 cm AV Area (Vmean):   1.68 cm AV Area (VTI):     1.76 cm AV Vmax:           133.00 cm/s AV Vmean:          97.400 cm/s AV VTI:            0.231 m AV Peak Grad:      7.1 mmHg AV Mean Grad:      4.0 mmHg LVOT Vmax:         77.70 cm/s LVOT Vmean:        57.700 cm/s LVOT VTI:          0.143 m LVOT/AV VTI ratio: 0.62  AORTA Ao Root diam: 2.60 cm MITRAL VALVE MV Area (PHT): 2.33 cm    SHUNTS MV Decel Time: 325 msec    Systemic VTI:  0.14 m MV E velocity: 56.60 cm/s  Systemic Diam: 1.90 cm MV A velocity: 72.80 cm/s MV E/A ratio:  0.78 Emeline Calender Electronically signed by Emeline Calender Signature Date/Time: 04/05/2024/10:10:09 AM    Final         Scheduled Meds:  aspirin EC  81 mg Oral Daily   cholecalciferol  5,000 Units Oral Daily   ezetimibe   10 mg Oral Daily   gabapentin   400 mg Oral BID   lidocaine   1 patch Transdermal Q24H   memantine   28 mg Oral Daily   tamsulosin   0.4 mg Oral Daily   Continuous Infusions:  sodium chloride  75 mL/hr at 04/06/24 0342          Sophie Mao, MD [facility] 04/06/2024, 8:54 AM

## 2024-04-06 NOTE — TOC Progression Note (Signed)
 Transition of Care [facility]) - Progression Note    Patient Details  Name: [name] MRN: [id number] Date of Birth: August 30, 1950  [facility] CM/SW Contact  Sonda Manuella Quill, RN Phone Number: 04/06/2024, 4:27 PM  Clinical Narrative:    Notified by pt's wife [name] that they have selected [facility] and Rehab; facility notified via SNF hub    Expected Discharge Plan: Skilled Nursing Facility Barriers to Discharge: Continued Medical Work up               Expected Discharge Plan and Services   Discharge Planning Services: CM Consult   Living arrangements for the past 2 months: Single Family Home                 DME Arranged: N/A DME Agency: NA       HH Arranged: NA HH Agency: NA Date HH Agency Contacted: 04/02/24   Representative spoke with at [facility] Agency: [facility]   Social Drivers of Health (SDOH) Interventions SDOH Screenings   Food Insecurity: No Food Insecurity (04/05/2024)  Housing: Low Risk  (04/05/2024)  Transportation Needs: No Transportation Needs (04/05/2024)  Utilities: Not At Risk (04/05/2024)  Depression (PHQ2-9): Low Risk  (04/12/2023)  Financial Resource Strain: Low Risk  (09/28/2023)   Received from [facility]  Social Connections: Moderately Integrated (04/04/2024)  Tobacco Use: Low Risk  (04/04/2024)    Readmission Risk Interventions     No data to display

## 2024-04-07 ENCOUNTER — Inpatient Hospital Stay (HOSPITAL_COMMUNITY)

## 2024-04-07 ENCOUNTER — Telehealth: Payer: Self-pay | Admitting: Diagnostic Neuroimaging

## 2024-04-07 DIAGNOSIS — S32010A Wedge compression fracture of first lumbar vertebra, initial encounter for closed fracture: Secondary | ICD-10-CM | POA: Diagnosis not present

## 2024-04-07 LAB — BASIC METABOLIC PANEL WITH GFR
Anion gap: 11 (ref 5–15)
BUN: 29 mg/dL — ABNORMAL HIGH (ref 8–23)
CO2: 22 mmol/L (ref 22–32)
Calcium: 9.3 mg/dL (ref 8.9–10.3)
Chloride: 108 mmol/L (ref 98–111)
Creatinine, Ser: 1.68 mg/dL — ABNORMAL HIGH (ref 0.61–1.24)
GFR, Estimated: 43 mL/min — ABNORMAL LOW (ref >60)
Glucose, Bld: 133 mg/dL — ABNORMAL HIGH (ref 70–99)
Potassium: 5.1 mmol/L (ref 3.5–5.1)
Sodium: 141 mmol/L (ref 135–145)

## 2024-04-07 LAB — GLUCOSE, CAPILLARY: Glucose-Capillary: 167 mg/dL — ABNORMAL HIGH (ref 70–99)

## 2024-04-07 LAB — MAGNESIUM: Magnesium: 2.1 mg/dL (ref 1.7–2.4)

## 2024-04-07 MED ORDER — BISACODYL 10 MG RE SUPP
10.0000 mg | Freq: Every day | RECTAL | Status: DC | PRN
  Administered 2024-04-08: 10 mg via RECTAL
  Filled 2024-04-07: qty 1

## 2024-04-07 NOTE — TOC Progression Note (Addendum)
 Transition of Care [facility]) - Progression Note    Patient Details  Name: [name] MRN: [id number] Date of Birth: 1951-06-10  [facility]) CM/SW Contact  Sonda Manuella Quill, RN Phone Number: 04/07/2024, 12:34 PM  Clinical Narrative:    LVM at [facility] triage line to start ins auth; awaiting return call;  Darrien, Admissions at [facility] given notification.  [phone]- return call from Ernest Prescott, RN from [facility]; she will initiate ins auth for SNF and transport by ambulance per PT recc; she was also given main # for IP CM for follow up; awaiting ins auth. Expected Discharge Plan: Skilled Nursing Facility Barriers to Discharge: Continued Medical Work up               Expected Discharge Plan and Services   Discharge Planning Services: CM Consult   Living arrangements for the past 2 months: Single Family Home                 DME Arranged: N/A DME Agency: NA       HH Arranged: NA HH Agency: NA Date HH Agency Contacted: 04/02/24   Representative spoke with at [facility] Agency: [facility]   Social Drivers of Health (SDOH) Interventions SDOH Screenings   Food Insecurity: No Food Insecurity (04/05/2024)  Housing: Low Risk  (04/05/2024)  Transportation Needs: No Transportation Needs (04/05/2024)  Utilities: Not At Risk (04/05/2024)  Depression (PHQ2-9): Low Risk  (04/12/2023)  Financial Resource Strain: Low Risk  (09/28/2023)   Received from [facility]  Social Connections: Moderately Integrated (04/04/2024)  Tobacco Use: Low Risk  (04/04/2024)    Readmission Risk Interventions     No data to display

## 2024-04-07 NOTE — Telephone Encounter (Signed)
 Patient's wife called in to after hours on-call line for [facility]. She is asking about tremors and mental status changes.  Patient now admitted due to fall and found to have acute L1 compression fracture.  Tremors have worsened in the last day or 2.  Inpatient team started primidone yesterday.  Patient still having issues with confusion, slurred speech, tremors and pain according to wife.  I recommended that she continue to communicate with inpatient team, and I defer to their management currently.  Patient has underlying mild dementia and tremor issue which probably are exacerbated by the current acute pain and compression fracture.  Hopefully once patient is able to be stabilized and transferred to rehab facility, he will gradually start to improve.  EDUARD FABIENE HANLON, MD 04/07/2024, 2:00 PM Certified in Neurology, Neurophysiology and Neuroimaging  [facility] [address] [place], [place] [address] [phone]

## 2024-04-07 NOTE — Progress Notes (Addendum)
 PROGRESS NOTE    [name]  FMW:[id number] DOB: [birth year] DOA: 04/02/2024 PCP: Aletha Bene, MD   Brief Narrative:  73 y.o. male with medical history significant of stage III CKD, type 2 diabetes, diabetic polyneuropathy, gout, hyperlipidemia, hypertension, hypogonadism, mild cognitive impairment, restless syndrome, parkinsonian features, tremor, vitamin D  deficiency presented with altered mental status and abnormal gait.  On presentation, patient was confused.  Troponin was 56, 59, 52 and 57.  Creatinine of 1.88.  Ammonia was 16.  MRI of brain without contrast did not show any acute intracranial abnormality.  MRI of lumbar spine showed acute compression fracture of L1 superior endplate with up to 35% height loss, no significant retropulsion; chronic compression fracture of T12; no significant spinal stenosis.  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  ED provider discussed with neurosurgery on-call who recommended conservative management and outpatient follow-up with neurosurgery.  PT recommended SNF placement.  Currently medically stable for discharge to SNF.  Assessment & Plan:   Acute closed compression fracture of L1 vertebra Chronic compression fracture of T12 Back pain Neck pain - Imaging as above. ED provider discussed with neurosurgery on-call who recommended conservative management and outpatient follow-up with neurosurgery. - Continue TLSO brace. -PT/OT recommending SNF placement.  TOC following - Patient complained of neck pain on 04/06/2024 and family requested MRI.  MRI of cervical spine pending.  Hypertension Hyperlipidemia -continue ezetimibe .  Antihypertensives on hold for now because of worsening kidney function.  CKD stage IIIb -Baseline creatinine of 1.5-1.8.  Creatinine improving to 1.68 today.  Encourage oral intake.  Off IV fluids.  Repeat a.m.  labs  Diabetes mellitus type 2 - Blood sugars stable.  Continue carb modified diet  Parkinsonian features  Tremors - Continue supportive care.  Patient was recently put on gabapentin  by neurology for tremors.  Apparently, tremors have been worsening: Started patient on primidone since last night as per recent neurology note from outpatient.  Outpatient follow-up with neurology  Mild neurocognitive disorder - Continue memantine   Neuropathy -Continue gabapentin   Elevated troponin - Possible from demand ischemia.  Troponins did not trend upwards.  No chest pain.  EF of 55 to 60% with grade 1 diastolic dysfunction   DVT prophylaxis: SCDs Code Status: Full Family Communication: spoke to wife on phone Disposition Plan: Status is: inpatient because: Of severity of illness.  Need for SNF placement.  Currently medically stable for discharge to rehab    Consultants: None.  ED provider curbsided with neurosurgery on-call Procedures: None  Antimicrobials: None   Subjective: Patient seen and examined at bedside.  Poor historian.  Having intermittent neck and back pain.  Had a rough night last night with tremors.  No fever, vomiting, abdominal pain reported  objective: Vitals:   04/06/24 0631 04/06/24 1425 04/06/24 2149 04/07/24 0405  BP: 132/86 (!) 162/74 (!) 159/77 (!) 166/85  Pulse: (!) 59 67 71 76  Resp: 15 17 16 18   Temp: 97.6 F (36.4 C) (!) 97.5 F (36.4 C) 98.1 F (36.7 C) 97.7 F (36.5 C)  TempSrc: Oral Oral Oral Oral  SpO2: 100% 100% 97% 100%  Weight:      Height:        Intake/Output Summary (Last 24 hours) at 04/07/2024 0806 Last data filed at 04/07/2024 0200 Gross per 24 hour  Intake 740 ml  Output 820 ml  Net -80 ml   Filed  Weights   04/04/24 2024 04/05/24 0600 04/06/24 0500  Weight: 69.2 kg 69.2 kg 70.4 kg    Examination:  General: Currently on room air and in no acute distress.  Slow respond.  Poor historian.  Hand tremors present  intermittently. respiratory: Bilateral decreased breath sounds at bases with no wheezing  CVS: Currently rate mostly controlled; S1-S2 heard  abdominal: Soft, nontender, distended mildly, no organomegaly; normal bowel sounds are heard  extremities: No clubbing; mild lower extremity edema present       Data Reviewed: I have personally reviewed following labs and imaging studies  CBC: Recent Labs  Lab 04/02/24 0841 04/03/24 1453 04/04/24 1555  WBC 8.4 8.4 6.8  NEUTROABS 6.4 6.3  --   HGB 15.9 16.2 15.7  HCT 49.7 48.2 48.5  MCV 85.2 82.7 84.3  PLT 295 303 273   Basic Metabolic Panel: Recent Labs  Lab 04/03/24 1453 04/04/24 1555 04/05/24 0850 04/06/24 0356 04/07/24 0317  NA 138 139 139 142 141  K 3.9 3.9 4.1 3.9 5.1  CL 104 105 108 109 108  CO2 18* 20* 18* 23 22  GLUCOSE 145* 167* 135* 101* 133*  BUN 26* 28* 31* 29* 29*  CREATININE 1.69* 2.00* 2.01* 1.87* 1.68*  CALCIUM  10.1 9.4 9.2 9.0 9.3  MG  --  2.1 2.2 2.1 2.1  PHOS  --  3.2  --   --   --    GFR: Estimated Creatinine Clearance: 35.3 mL/min (A) (by C-G formula based on SCr of 1.68 mg/dL (H)). Liver Function Tests: Recent Labs  Lab 04/04/24 1555  AST 22  ALT 13  ALKPHOS 181*  BILITOT 0.5  PROT 6.5  ALBUMIN 4.1   No results for input(s): LIPASE, AMYLASE in the last 168 hours. Recent Labs  Lab 04/04/24 0026  AMMONIA 16   Coagulation Profile: No results for input(s): INR, PROTIME in the last 168 hours. Cardiac Enzymes: No results for input(s): CKTOTAL, CKMB, CKMBINDEX, TROPONINI in the last 168 hours. BNP (last 3 results) No results for input(s): PROBNP in the last 8760 hours. HbA1C: No results for input(s): HGBA1C in the last 72 hours. CBG: No results for input(s): GLUCAP in the last 168 hours. Lipid Profile: No results for input(s): CHOL, HDL, LDLCALC, TRIG, CHOLHDL, LDLDIRECT in the last 72 hours. Thyroid  Function Tests: No results for input(s): TSH,  T4TOTAL, FREET4, T3FREE, THYROIDAB in the last 72 hours.  Anemia Panel: No results for input(s): VITAMINB12, FOLATE, FERRITIN, TIBC, IRON, RETICCTPCT in the last 72 hours. Sepsis Labs: No results for input(s): PROCALCITON, LATICACIDVEN in the last 168 hours.  No results found for this or any previous visit (from the past 240 hours).       Radiology Studies: No results found.       Scheduled Meds:  aspirin EC  81 mg Oral Daily   cholecalciferol  5,000 Units Oral Daily   ezetimibe   10 mg Oral Daily   gabapentin   400 mg Oral BID   lidocaine   1 patch Transdermal Q24H   memantine   28 mg Oral Daily   primidone  50 mg Oral QHS   tamsulosin   0.4 mg Oral Daily   Continuous Infusions:          Sophie Mao, MD [facility] 04/07/2024, 8:06 AM

## 2024-04-08 DIAGNOSIS — I129 Hypertensive chronic kidney disease with stage 1 through stage 4 chronic kidney disease, or unspecified chronic kidney disease: Secondary | ICD-10-CM

## 2024-04-08 DIAGNOSIS — R251 Tremor, unspecified: Secondary | ICD-10-CM

## 2024-04-08 DIAGNOSIS — E1122 Type 2 diabetes mellitus with diabetic chronic kidney disease: Secondary | ICD-10-CM

## 2024-04-08 DIAGNOSIS — S32010A Wedge compression fracture of first lumbar vertebra, initial encounter for closed fracture: Secondary | ICD-10-CM | POA: Diagnosis not present

## 2024-04-08 LAB — AMMONIA: Ammonia: 25 umol/L (ref 9–35)

## 2024-04-08 LAB — GLUCOSE, CAPILLARY: Glucose-Capillary: 153 mg/dL — ABNORMAL HIGH (ref 70–99)

## 2024-04-08 LAB — TSH: TSH: 0.897 u[IU]/mL (ref 0.350–4.500)

## 2024-04-08 MED ORDER — HYDRALAZINE HCL 25 MG PO TABS
25.0000 mg | ORAL_TABLET | Freq: Four times a day (QID) | ORAL | Status: DC | PRN
  Administered 2024-04-08: 25 mg via ORAL
  Filled 2024-04-08: qty 1

## 2024-04-08 MED ORDER — ALUM & MAG HYDROXIDE-SIMETH 200-200-20 MG/5ML PO SUSP
30.0000 mL | Freq: Four times a day (QID) | ORAL | Status: DC | PRN
  Administered 2024-04-08: 30 mL via ORAL
  Filled 2024-04-08: qty 30

## 2024-04-08 MED ORDER — LIDOCAINE 5 % EX PTCH
2.0000 | MEDICATED_PATCH | CUTANEOUS | Status: DC
  Administered 2024-04-10: 2 via TRANSDERMAL
  Filled 2024-04-08 (×2): qty 2

## 2024-04-08 MED ORDER — GABAPENTIN 100 MG PO CAPS
200.0000 mg | ORAL_CAPSULE | Freq: Two times a day (BID) | ORAL | Status: DC
Start: 2024-04-08 — End: 2024-04-08

## 2024-04-08 MED ORDER — ALPRAZOLAM 0.5 MG PO TABS
0.5000 mg | ORAL_TABLET | Freq: Once | ORAL | Status: AC
  Administered 2024-04-08: 0.5 mg via ORAL
  Filled 2024-04-08: qty 1

## 2024-04-08 MED ORDER — AMLODIPINE BESYLATE 5 MG PO TABS
5.0000 mg | ORAL_TABLET | Freq: Every day | ORAL | Status: DC
  Administered 2024-04-08 – 2024-04-10 (×3): 5 mg via ORAL
  Filled 2024-04-08 (×3): qty 1

## 2024-04-08 MED ORDER — GABAPENTIN 100 MG PO CAPS
100.0000 mg | ORAL_CAPSULE | Freq: Two times a day (BID) | ORAL | Status: DC
  Administered 2024-04-08 – 2024-04-10 (×4): 100 mg via ORAL
  Filled 2024-04-08 (×4): qty 1

## 2024-04-08 MED ORDER — IBUPROFEN 400 MG PO TABS
400.0000 mg | ORAL_TABLET | Freq: Once | ORAL | Status: AC
  Administered 2024-04-08: 400 mg via ORAL
  Filled 2024-04-08: qty 1

## 2024-04-08 MED ORDER — CARBIDOPA-LEVODOPA 25-100 MG PO TABS
1.0000 | ORAL_TABLET | Freq: Three times a day (TID) | ORAL | Status: DC
  Administered 2024-04-09 – 2024-04-10 (×5): 1 via ORAL
  Filled 2024-04-08 (×5): qty 1

## 2024-04-08 MED ORDER — POLYVINYL ALCOHOL 1.4 % OP SOLN
2.0000 [drp] | OPHTHALMIC | Status: DC | PRN
  Administered 2024-04-08: 2 [drp] via OPHTHALMIC
  Filled 2024-04-08: qty 15

## 2024-04-08 MED ORDER — PANTOPRAZOLE SODIUM 40 MG PO TBEC
40.0000 mg | DELAYED_RELEASE_TABLET | Freq: Every day | ORAL | Status: DC
  Administered 2024-04-08 – 2024-04-10 (×3): 40 mg via ORAL
  Filled 2024-04-08 (×3): qty 1

## 2024-04-08 MED ORDER — HYDROCODONE-ACETAMINOPHEN 5-325 MG PO TABS
1.0000 | ORAL_TABLET | Freq: Four times a day (QID) | ORAL | Status: DC | PRN
  Administered 2024-04-10: 1 via ORAL
  Filled 2024-04-08: qty 1

## 2024-04-08 MED ORDER — SENNOSIDES-DOCUSATE SODIUM 8.6-50 MG PO TABS
1.0000 | ORAL_TABLET | Freq: Two times a day (BID) | ORAL | Status: DC
  Administered 2024-04-08 – 2024-04-10 (×5): 1 via ORAL
  Filled 2024-04-08 (×5): qty 1

## 2024-04-08 NOTE — Consult Note (Signed)
 NEUROLOGY CONSULT NOTE   Date of service: April 08, 2024 Patient Name: [name] MRN:  [id number] DOB:  1951/05/06 Chief Complaint: Tremors Requesting Provider: [name] Page, MD  History of Present Illness  [name] is a 73 y.o. 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  His MRI of the lumbar spine showed acute L1 superior endplate compression fracture and chronic T12 compression fracture.  He is being managed conservatively for the fracture. Neurology was consulted for the parkinsonian features because he also provides history of increasing falls and worsening tremor. Patient reports that his tremors have now been present and worsening for over a year. He does not recall having tried Sinemet. 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  There was no evidence of radiculopathy on outpatient MRI cervical spine done earlier this year. There was concern that despite DaTscan  being negative, he does have parkinsonian signs and symptoms-shuffling, soft voice, cogwheeling in the arms and is probably a variant parkinsonian form that was negative on DaTscan .     ROS  Comprehensive ROS performed and pertinent positives documented in the HPI  Past History   Past Medical History:  Diagnosis Date   Allergy    Bone spur    Left Shoulder   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 (HCC) 07/18/2017   Fracture    right forearm   Gout    Hyperlipidemia    Hypertension     Other testicular hypofunction    PONV (postoperative nausea and vomiting)    Type II or unspecified type diabetes mellitus without mention of complication, not stated as uncontrolled     Past Surgical History:  Procedure Laterality Date   CATARACT EXTRACTION Left 08/2023   CYST REMOVAL HAND     ORIF RADIAL FRACTURE Right 07/18/2019   Procedure: OPEN REDUCTION INTERNAL FIXATION (ORIF) RADIUS AND ULNA FRACTURE;  Surgeon: Kendal Franky SQUIBB, MD;  Location: [facility] OR;  Service: Orthopedics;  Laterality: Right;   PROSTATE BIOPSY  2010   WRIST SURGERY Right 1998    Family History: Family History  Problem Relation Age of Onset   Heart disease Mother 40       stent   Heart disease Father    Kidney disease Father    Heart failure Father    Alzheimer's disease Paternal Grandfather    Colon cancer Neg Hx    Esophageal cancer Neg Hx    Liver cancer Neg Hx    Pancreatic cancer Neg Hx    Rectal cancer Neg Hx    Stomach cancer Neg Hx     Social History  reports that he has never smoked. He has never used smokeless tobacco. He reports that he does not currently use alcohol. He reports that he does not use drugs.  Allergies  Allergen Reactions   Tape Other (See Comments)    SKIN IS VERY THIN. IT TEARS AND BRUISES VERY EASILY!!   Crestor [Rosuvastatin] Diarrhea   Welchol [Colesevelam Hcl] Diarrhea   Zoloft [Sertraline Hcl] Other (See Comments)  Reaction not recalled    Penicillins Hives, Nausea And Vomiting, Swelling and Other (See Comments)    Severe hives and possible swelling of the throat    Medications   Current Facility-Administered Medications:    acetaminophen  (TYLENOL ) tablet 650 mg, 650 mg, Oral, Q6H PRN, 650 mg at 04/08/24 1622 **OR** acetaminophen  (TYLENOL ) suppository 650 mg, 650 mg, Rectal, Q6H PRN, [name], [name], DO   alum & mag hydroxide-simeth (MAALOX/MYLANTA) 200-200-20 MG/5ML suspension 30 mL, 30 mL, Oral, Q6H PRN, [name], [name], MD, 30 mL at 04/08/24 0055    amLODipine (NORVASC) tablet 5 mg, 5 mg, Oral, Daily, [name], [name], MD, 5 mg at 04/08/24 0949   artificial tears ophthalmic solution 2 drop, 2 drop, Both Eyes, PRN, [name], [name], MD, 2 drop at 04/08/24 0300   aspirin EC tablet 81 mg, 81 mg, Oral, Daily, [name], MD, 81 mg at 04/08/24 0948   bisacodyl (DULCOLAX) suppository 10 mg, 10 mg, Rectal, Daily PRN, [name], [name], MD, 10 mg at 04/08/24 0036   cholecalciferol (VITAMIN D3) 25 MCG (1000 UNIT) tablet 5,000 Units, 5,000 Units, Oral, Daily, [name], MD, 5,000 Units at 04/08/24 0948   ezetimibe  (ZETIA ) tablet 10 mg, 10 mg, Oral, Daily, [name], [name], MD, 10 mg at 04/08/24 9051   gabapentin  (NEURONTIN ) capsule 100 mg, 100 mg, Oral, BID, Shreyas Piatkowski, MD, 100 mg at 04/08/24 2146   HYDROcodone -acetaminophen  (NORCO/VICODIN) 5-325 MG per tablet 1-2 tablet, 1-2 tablet, Oral, Q6H PRN, [name], [name], MD   [START ON 04/09/2024] lidocaine  (LIDODERM ) 5 % 2 patch, 2 patch, Transdermal, Q24H, [name], [name], MD   melatonin tablet 3 mg, 3 mg, Oral, QHS PRN, [name], [name], DO, 3 mg at 04/08/24 2145   memantine  (NAMENDA  XR) 24 hr capsule 28 mg, 28 mg, Oral, Daily, [name], [name], MD, 28 mg at 04/08/24 0948   ondansetron  (ZOFRAN ) injection 4 mg, 4 mg, Intravenous, Q6H PRN, [name], [name], DO, 4 mg at 04/08/24 0957   pantoprazole (PROTONIX) EC tablet 40 mg, 40 mg, Oral, Daily, [name], [name], MD, 40 mg at 04/08/24 1242   polyethylene glycol (MIRALAX / GLYCOLAX) packet 17 g, 17 g, Oral, Daily PRN, [name], NP, 17 g at 04/07/24 1017   primidone (MYSOLINE) tablet 50 mg, 50 mg, Oral, QHS, [name], [name], MD, 50 mg at 04/08/24 2145   senna-docusate (Senokot-S) tablet 1 tablet, 1 tablet, Oral, BID, [name], [name], MD, 1 tablet at 04/08/24 2146   tamsulosin  (FLOMAX ) capsule 0.4 mg, 0.4 mg, Oral, Daily, [name], MD, 0.4 mg at 04/08/24 0948  Vitals   Vitals:   04/08/24 1308 04/08/24 1600 04/08/24 1751 04/08/24 2053  BP: (!)  167/77 (!) 157/101 (!) 155/72 139/78  Pulse: 72 (!) 101 88 71  Resp:    18  Temp:   98.7 F (37.1 C) (!) 97.5 F (36.4 C)  TempSrc:      SpO2: 100% 100% 99% 100%  Weight:      Height:        Body mass index is 25.26 kg/m.   Physical Exam  General: Awake alert HEENT: Normocephalic/atraumatic Lungs: Clear Vascular: Regular rhythm Neurological exam He is awake alert oriented x 2 His speech is mildly hypophonic There is no evidence of dysarthria Cranial nerves: Pupils equal round react light, extract movements intact, visual fields full, facial sensation intact, face is symmetric, there is hypomimia, tongue and palate midline. Motor examination shows significant cogwheeling in both upper extremities as well as in lower extremities with increased tone and brisk  deep tendon reflexes. Sensory exam intact to light touch Coordination examination reveals no gross dysmetria or action tremor. Gait testing deferred    Labs/Imaging/Neurodiagnostic studies   CBC:  Recent Labs  Lab 04-24-2024 0841 04/03/24 1453 04/04/24 1555  WBC 8.4 8.4 6.8  NEUTROABS 6.4 6.3  --   HGB 15.9 16.2 15.7  HCT 49.7 48.2 48.5  MCV 85.2 82.7 84.3  PLT 295 303 273   Basic Metabolic Panel:  Lab Results  Component Value Date   NA 141 04/07/2024   K 5.1 04/07/2024   CO2 22 04/07/2024   GLUCOSE 133 (H) 04/07/2024   BUN 29 (H) 04/07/2024   CREATININE 1.68 (H) 04/07/2024   CALCIUM  9.3 04/07/2024   GFRNONAA 43 (L) 04/07/2024   GFRAA 49 (L) 12/02/2020   Lipid Panel:  Lab Results  Component Value Date   LDLCALC 94 12/06/2023   HgbA1c:  Lab Results  Component Value Date   HGBA1C 6.3 (H) 12/06/2023   INR  Lab Results  Component Value Date   INR 1.01 07/31/2009   APTT  Lab Results  Component Value Date   APTT 29 07/31/2009   Imaging personally reviewed MR brain without contrast: No acute findings MRI lumbar spine with acute compression fracture of L1 superior endplate and up to 35% height  loss but no significant retropulsion.  Chronic compression fracture of T12 with up to 50% height loss and trace bony retropulsion.  Underlying DJD. MRI of the C-spine: Chronic degenerative disease at C3-4 with mild to moderate central spinal canal stenosis and moderate right neural foraminal stenosis.  Broad-based disc bulging at C6-7 with mild to moderate central canal spinal stenosis.  Neuroforamina are patent.  ASSESSMENT   [name] is a 73 y.o. 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 Neurology was consulted for worsening tremors.  Reports that worsening tremors is not new it has been going on for past few months. He has not tried Sinemet-his exam has significant parkinsonian findings and I feel a trial of Sinemet might be helpful at this time.  Gabapentin  toxicity might of caused exacerbation of tremors 2.   Impression: Parkinsonian symptoms, question primary Parkinson's.  Worsening of symptoms in the setting of CKD and gabapentin .  RECOMMENDATIONS  Decrease gabapentin  to 100 twice daily Trial of Sinemet-ordered Continue therapy assessments Agree with Dr. Khaliqdina -- reduce opioids as much as possible Agree with checking TSH, ammonia, copper, serial plasmin. Neurology will follow Plan relayed to Dr. [name] via secure chat message ______________________________________________________________________    Signed, Eligio Lav, MD [facility]
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
## 2024-04-08 NOTE — Plan of Care (Signed)
   Problem: Activity: Goal: Risk for activity intolerance will decrease Outcome: Progressing   Problem: Nutrition: Goal: Adequate nutrition will be maintained Outcome: Progressing   Problem: Pain Managment: Goal: General experience of comfort will improve and/or be controlled Outcome: Progressing

## 2024-04-08 NOTE — Significant Event (Signed)
 Rapid Response Event Note   Reason for Call :  Bedside RN stated she needed someone to come do an assessment on him as patient has multiple complaints of tremors, eye burning, no BM for several days, and epigastric discomfort.   Initial Focused Assessment:  Pain, nausea, tremors, and constipation already addressed by Bedside RN and attending orders. Patient is alert, but needs prompting for orientation questions. Does eventually verbalize correct orientation to person, place, and time, but not situation. This appears consistent throughout admission. VS show hypertension consistent with admission vitals. Tremors known to attending and neurology consult has occurred.  Interventions:  Per previously ordered standing orders, Liqui-film eye drops ordered for eye irritation. Bedside RN will give bisacodyl suppository for constipation and order medication for indigestion per previously ordered standing orders if needed.   Plan of Care:  Monitor vitals and continue plan of care. Full Delirium precautions. Call RR and provider if changes in condition.  Event Summary:   Call Time: 0018 Arrival Time: 0020 End Time: 0030  Alan LITTIE Portugal, RN

## 2024-04-08 NOTE — Progress Notes (Signed)
 Patient currently asleep and resting comfortably. Will continue to monitor the patient.

## 2024-04-08 NOTE — Progress Notes (Signed)
 PROGRESS NOTE    [name]  FMW:[id number] DOB: February 15, 1951 DOA: 04/02/2024 PCP: Aletha Bene, MD   Brief Narrative:  73 y.o. male with medical history significant of stage III CKD, type 2 diabetes, diabetic polyneuropathy, gout, hyperlipidemia, hypertension, hypogonadism, mild cognitive impairment, restless syndrome, parkinsonian features, tremor, vitamin D  deficiency presented with altered mental status and abnormal gait.  On presentation, patient was confused.  Troponin was 56, 59, 52 and 57.  Creatinine of 1.88.  Ammonia was 16.  MRI of brain without contrast did not show any acute intracranial abnormality.  MRI of lumbar spine showed acute compression fracture of L1 superior endplate with up to 35% height loss, no significant retropulsion; chronic compression fracture of T12; no significant spinal stenosis.  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  ED provider discussed with neurosurgery on-call who recommended conservative management and outpatient follow-up with neurosurgery.  PT recommended SNF placement.  Currently medically stable for discharge to SNF.  Assessment & Plan:   Acute closed compression fracture of L1 vertebra Chronic compression fracture of T12 Back pain Neck pain - Imaging as above. ED provider discussed with neurosurgery on-call who recommended conservative management and outpatient follow-up with neurosurgery. - Continue TLSO brace. -PT/OT recommending SNF placement.  TOC following - MRI of cervical spine showed mild to moderate C3-C4 stenosis and C6-C7  Hypertension Hyperlipidemia -continue ezetimibe .  Blood pressures on the higher side.  Start amlodipine.  Will hold off on enalapril  because of recent increase in creatinine.  CKD stage IIIb -Baseline creatinine of 1.5-1.8.  Creatinine improving to 1.68 on 04/07/2024.  Encourage oral intake.  Off  IV fluids.  Labs pending today.  Diabetes mellitus type 2 - Blood sugars stable.  Continue carb modified diet  Parkinsonian features  Tremors - Continue supportive care.  Patient was recently put on gabapentin  by neurology for tremors.  Apparently, tremors have been worsening: Started patient on primidone on 03/28/2024 as per recent neurology note from outpatient.  Outpatient follow-up with neurology  Mild neurocognitive disorder - Continue memantine   Neuropathy -Continue gabapentin   Elevated troponin - Possible from demand ischemia.  Troponins did not trend upwards.  No chest pain.  EF of 55 to 60% with grade 1 diastolic dysfunction   DVT prophylaxis: SCDs Code Status: Full Family Communication: spoke to wife on phone on 03/28/2024 Disposition Plan: Status is: inpatient because: Of severity of illness.  Need for SNF placement.     Consultants: None.  ED provider curbsided with neurosurgery on-call Procedures: None  Antimicrobials: None   Subjective: Patient seen and examined at bedside.  Poor historian.  Continues to have intermittent tremors with back and neck pain.  No seizures, vomiting, fever reported.   Objective: Vitals:   04/08/24 0027 04/08/24 0500 04/08/24 0613 04/08/24 0728  BP: (!) 186/87  (!) 156/83 (!) 166/75  Pulse: 76  73 72  Resp: 18  15 18   Temp: 97.6 F (36.4 C)  98 F (36.7 C) 98 F (36.7 C)  TempSrc:   Oral Oral  SpO2: 100%  100% 100%  Weight:  71 kg    Height:        Intake/Output Summary (Last 24 hours) at 04/08/2024 0758 Last data filed at 04/08/2024 0600 Gross per 24 hour  Intake 240 ml  Output 600 ml  Net -360 ml   Filed Weights   04/05/24 0600 04/06/24 0500 04/08/24  0500  Weight: 69.2 kg 70.4 kg 71 kg    Examination:  General: Remains on room air and in no distress.  Still very slow respond.  Poor historian.  Hand tremors present intermittently. respiratory: Decreased breath sound bases bilaterally with scattered crackles CVS:  S1-S2 heard; rate currently controlled  abdominal: Soft, nontender, distended slightly; no organomegaly; bowel sounds heard  extremities: Trace lower extremity edema present; no cyanosis       Data Reviewed: I have personally reviewed following labs and imaging studies  CBC: Recent Labs  Lab 04/02/24 0841 04/03/24 1453 04/04/24 1555  WBC 8.4 8.4 6.8  NEUTROABS 6.4 6.3  --   HGB 15.9 16.2 15.7  HCT 49.7 48.2 48.5  MCV 85.2 82.7 84.3  PLT 295 303 273   Basic Metabolic Panel: Recent Labs  Lab 04/03/24 1453 04/04/24 1555 04/05/24 0850 04/06/24 0356 04/07/24 0317  NA 138 139 139 142 141  K 3.9 3.9 4.1 3.9 5.1  CL 104 105 108 109 108  CO2 18* 20* 18* 23 22  GLUCOSE 145* 167* 135* 101* 133*  BUN 26* 28* 31* 29* 29*  CREATININE 1.69* 2.00* 2.01* 1.87* 1.68*  CALCIUM  10.1 9.4 9.2 9.0 9.3  MG  --  2.1 2.2 2.1 2.1  PHOS  --  3.2  --   --   --    GFR: Estimated Creatinine Clearance: 35.3 mL/min (A) (by C-G formula based on SCr of 1.68 mg/dL (H)). Liver Function Tests: Recent Labs  Lab 04/04/24 1555  AST 22  ALT 13  ALKPHOS 181*  BILITOT 0.5  PROT 6.5  ALBUMIN 4.1   No results for input(s): LIPASE, AMYLASE in the last 168 hours. Recent Labs  Lab 04/04/24 0026  AMMONIA 16   Coagulation Profile: No results for input(s): INR, PROTIME in the last 168 hours. Cardiac Enzymes: No results for input(s): CKTOTAL, CKMB, CKMBINDEX, TROPONINI in the last 168 hours. BNP (last 3 results) No results for input(s): PROBNP in the last 8760 hours. HbA1C: No results for input(s): HGBA1C in the last 72 hours. CBG: Recent Labs  Lab 04/07/24 1924 04/08/24 0024  GLUCAP 167* 153*   Lipid Profile: No results for input(s): CHOL, HDL, LDLCALC, TRIG, CHOLHDL, LDLDIRECT in the last 72 hours. Thyroid  Function Tests: No results for input(s): TSH, T4TOTAL, FREET4, T3FREE, THYROIDAB in the last 72 hours.  Anemia Panel: No results for  input(s): VITAMINB12, FOLATE, FERRITIN, TIBC, IRON, RETICCTPCT in the last 72 hours. Sepsis Labs: No results for input(s): PROCALCITON, LATICACIDVEN in the last 168 hours.  No results found for this or any previous visit (from the past 240 hours).       Radiology Studies: MR CERVICAL SPINE WO CONTRAST Result Date: 04/07/2024 EXAM: MRI CERVICAL SPINE WITHOUT CONTRAST 04/07/2024 08:00:53 AM TECHNIQUE: Multiplanar multisequence MRI of the cervical spine was performed. COMPARISON: CT of the cervical spine dated 04/02/2024 and MRI of the cervical spine dated 03/17/2023. CLINICAL HISTORY: Myelopathy, acute, cervical spine. Myelopathy, acute, cervical spine ; Prior 03/29/23. Myelopathy, acute, cervical spine. Myelopathy, acute, cervical spine ; Prior 03/29/23. FINDINGS: BONES AND ALIGNMENT: Normal alignment. Normal vertebral body heights. Bone marrow signal is unremarkable. SPINAL CORD: Normal spinal cord size. No abnormal spinal cord signal. SOFT TISSUES: No paraspinal mass. C2-C3: No significant disc herniation. No spinal canal stenosis or neural foraminal narrowing. C3-C4: Chronic degenerative disc disease with right-sided uncovertebral joint hypertrophy and facet arthrosis, with mild-to-moderate central spinal canal stenosis and moderate right neural foraminal stenosis. C4-C5: No significant disc herniation.  No spinal canal stenosis or neural foraminal narrowing. C5-C6: No significant disc herniation. No spinal canal stenosis or neural foraminal narrowing. C6-C7: Broad-based disc bulging, with mild-to-moderate central spinal canal stenosis. The neural foramina are patent. C7-T1: No significant disc herniation. No spinal canal stenosis or neural foraminal narrowing. IMPRESSION: 1. Chronic degenerative disc disease at C3-4 with mild-to-moderate central spinal canal stenosis and moderate right neural foraminal stenosis. 2. Broad-based disc bulging at C6-7 with mild-to-moderate central spinal  canal stenosis. Neural foramina are patent. Electronically signed by: Evalene Coho MD 04/07/2024 08:50 AM EDT RP Workstation: [id number]         Scheduled Meds:  aspirin EC  81 mg Oral Daily   cholecalciferol  5,000 Units Oral Daily   ezetimibe   10 mg Oral Daily   gabapentin   400 mg Oral BID   lidocaine   1 patch Transdermal Q24H   memantine   28 mg Oral Daily   primidone  50 mg Oral QHS   tamsulosin   0.4 mg Oral Daily   Continuous Infusions:          Sophie Mao, MD [facility] 04/08/2024, 7:58 AM

## 2024-04-08 NOTE — TOC Progression Note (Addendum)
 Transition of Care [facility]) - Progression Note    Patient Details  Name: [name] MRN: [id number] Date of Birth: [birth year]  [facility]) CM/SW Contact  Heather DELENA Saltness, LCSW Phone Number: 04/08/2024, 10:16 AM  Clinical Narrative:     ADDENDUM  Peer-to-peer has been approved for SNF rehab at [facility] and Re[facility]. Auth ID: [id number]. Insurance authorization for ambulance transportation has been approved. Auth ID: [id number]. CSW notified [facility] admissions who report pt can admit tomorrow. MD notified.  CSW spoke with Jori GARFINKEL RN, at [employer]. Per Jori, [employer] has requested peer-to-peer for SNF insurance authorization. Dr. Marcey Sar, [phone]. Peer-to-peer is due by tomorrow 10/21 at 10 AM. Attending MD notified.  CSW attempted to speak with UR RN, [phone], at [employer] regarding pt's authorization for SNF placement at [facility]. No answer, voicemail left. TOC will continue to follow.   Expected Discharge Plan: Skilled Nursing Facility Barriers to Discharge: Continued Medical Work up   Expected Discharge Plan and Services   Discharge Planning Services: CM Consult   Living arrangements for the past 2 months: Single Family Home                 DME Arranged: N/A DME Agency: NA       HH Arranged: NA HH Agency: NA Date HH Agency Contacted: 04/02/24   Representative spoke with at [facility] Agency: [facility]   Social Drivers of Health (SDOH) Interventions SDOH Screenings   Food Insecurity: No Food Insecurity (04/05/2024)  Housing: Low Risk  (04/05/2024)  Transportation Needs: No Transportation Needs (04/05/2024)  Utilities: Not At Risk (04/05/2024)  Depression (PHQ2-9): Low Risk  (04/12/2023)  Financial Resource Strain: Low Risk  (09/28/2023)   Received from [facility]  Social Connections: Moderately Integrated (04/04/2024)  Tobacco Use: Low Risk  (04/04/2024)    Readmission Risk  Interventions     No data to display          Signed: Heather Saltness, MSW, LCSW Clinical Social Worker Inpatient Care Management 04/08/2024 10:18 AM

## 2024-04-08 NOTE — Progress Notes (Signed)
 Patient c/o epigastric pain, nausea, burning eyes, tremors, and no BM. Patient alert & oriented x 3. Patient was given Zofran . Vital Signs were taken with elevated BP, which has been consistent since hospital admission. Glucose:153. Rapid Nurse called for assessment d/t patient multiple complaints and status change. Will give the patient Bisacodyl for constipation, Liquid Tears for burning eyes, and  Maalox to help with epigastric symptoms. On-Call NP Stephen) made aware of the patient status and will continue to monitor the patient.

## 2024-04-08 NOTE — Progress Notes (Addendum)
 Consulted for assistance with tremor management around 4pm. However, too late for our team to leave [facility] to go to [facility] at this time. Will have him on schedule to see tomorrow.  Will cut down gabapentin  in half for now. Request judicious use of opiods and will defer how to cut down opiods to primary team.  Ordered TSH, Ammonia, Copper and Ceruloplasmin.  Discussed with Dr. [name] over secure chat.  Kanton Kamel [facility]

## 2024-04-09 DIAGNOSIS — S32010A Wedge compression fracture of first lumbar vertebra, initial encounter for closed fracture: Secondary | ICD-10-CM | POA: Diagnosis not present

## 2024-04-09 LAB — BASIC METABOLIC PANEL WITH GFR
Anion gap: 12 (ref 5–15)
BUN: 21 mg/dL (ref 8–23)
CO2: 22 mmol/L (ref 22–32)
Calcium: 9.5 mg/dL (ref 8.9–10.3)
Chloride: 101 mmol/L (ref 98–111)
Creatinine, Ser: 1.61 mg/dL — ABNORMAL HIGH (ref 0.61–1.24)
GFR, Estimated: 45 mL/min — ABNORMAL LOW (ref >60)
Glucose, Bld: 120 mg/dL — ABNORMAL HIGH (ref 70–99)
Potassium: 4 mmol/L (ref 3.5–5.1)
Sodium: 135 mmol/L (ref 135–145)

## 2024-04-09 LAB — MAGNESIUM: Magnesium: 2.3 mg/dL (ref 1.7–2.4)

## 2024-04-09 LAB — CERULOPLASMIN: Ceruloplasmin: 22.7 mg/dL (ref 16.0–31.0)

## 2024-04-09 NOTE — Plan of Care (Signed)
  Problem: Activity: Goal: Risk for activity intolerance will decrease Outcome: Progressing   Problem: Nutrition: Goal: Adequate nutrition will be maintained Outcome: Progressing   Problem: Coping: Goal: Level of anxiety will decrease Outcome: Progressing   Problem: Pain Managment: Goal: General experience of comfort will improve and/or be controlled Outcome: Progressing

## 2024-04-09 NOTE — TOC Progression Note (Signed)
 Transition of Care [facility]) - Progression Note    Patient Details  Name: [name] MRN: [id number] Date of Birth: 1951-01-20  Transition of Care [facility]) CM/SW Contact  NORMAN ASPEN, LCSW Phone Number: 04/09/2024, 1:39 PM  Clinical Narrative:     Per MD, pt not yet medically cleared for dc to SNF but hopefully will be ready tomorrow.  Pt/ family/ [name] all aware.  Expected Discharge Plan: Skilled Nursing Facility Barriers to Discharge: Continued Medical Work up               Expected Discharge Plan and Services   Discharge Planning Services: CM Consult   Living arrangements for the past 2 months: Single Family Home                 DME Arranged: N/A DME Agency: NA       HH Arranged: NA HH Agency: NA Date HH Agency Contacted: 04/02/24   Representative spoke with at [facility] Agency: [facility]   Social Drivers of Health (SDOH) Interventions SDOH Screenings   Food Insecurity: No Food Insecurity (04/05/2024)  Housing: Low Risk  (04/05/2024)  Transportation Needs: No Transportation Needs (04/05/2024)  Utilities: Not At Risk (04/05/2024)  Depression (PHQ2-9): Low Risk  (04/12/2023)  Financial Resource Strain: Low Risk  (09/28/2023)   Received from [facility]  Social Connections: Moderately Integrated (04/04/2024)  Tobacco Use: Low Risk  (04/04/2024)    Readmission Risk Interventions     No data to display
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
## 2024-04-09 NOTE — Progress Notes (Addendum)
 PROGRESS NOTE    [name]  FMW:[id number] DOB: 18-Dec-1950 DOA: 04/02/2024 PCP: Aletha Bene, MD   Brief Narrative:  73 y.o. male with medical history significant of stage III CKD, type 2 diabetes, diabetic polyneuropathy, gout, hyperlipidemia, hypertension, hypogonadism, mild cognitive impairment, restless syndrome, parkinsonian features, tremor, vitamin D  deficiency presented with altered mental status and abnormal gait.  On presentation, patient was confused.  Troponin was 56, 59, 52 and 57.  Creatinine of 1.88.  Ammonia was 16.  MRI of brain without contrast did not show any acute intracranial abnormality.  MRI of lumbar spine showed acute compression fracture of L1 superior endplate with up to 35% height loss, no significant retropulsion; chronic compression fracture of T12; no significant spinal stenosis.  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  ED provider discussed with neurosurgery on-call who recommended conservative management and outpatient follow-up with neurosurgery.  PT recommended SNF placement.   Assessment & Plan:   Acute closed compression fracture of L1 vertebra Chronic compression fracture of T12 Back pain Neck pain - Imaging as above. ED provider discussed with neurosurgery on-call who recommended conservative management and outpatient follow-up with neurosurgery. - Continue TLSO brace. -PT/OT recommending SNF placement.  TOC following - MRI of cervical spine showed mild to moderate C3-C4 stenosis and C6-C7 - Continue current pain management: Neurology recommended to use opiates as little as possible.  Will switch to Tylenol  around-the-clock.  Continue lidocaine  patch.  Hypertension Hyperlipidemia -continue ezetimibe .  Blood pressure improving after starting amlodipine.  Monitor.  Will hold off on enalapril  because of recent increase in  creatinine.  CKD stage IIIb -Baseline creatinine of 1.5-1.8.  Creatinine improving to 1.68 on 04/07/2024.  Encourage oral intake.  Off IV fluids.  Labs pending today.  Diabetes mellitus type 2 - Blood sugars stable.  Continue carb modified diet  Parkinsonian features  Tremors - Continue supportive care.  Patient was recently put on gabapentin  by neurology for tremors.  Apparently, tremors have been worsening: Started patient on primidone on 04/07/2024 as per recent neurology note from outpatient.  Tremors continued to worsen: Neurology consulted on 04/08/2024; gabapentin  dose has been decreased to 100 mg twice a day as neurology thinks that patient might have gabapentin  toxicity aggravating tremors.  Trial of Sinemet has been started as well.  Follow further neurology recommendations. I will DC primidone  Mild neurocognitive disorder - Continue memantine   Neuropathy - Gabapentin  plan as above  Elevated troponin - Possible from demand ischemia.  Troponins did not trend upwards.  No chest pain.  EF of 55 to 60% with grade 1 diastolic dysfunction   DVT prophylaxis: SCDs Code Status: Full Family Communication: spoke to wife on phone on 03/28/2024 Disposition Plan: Status is: inpatient because: Of severity of illness.  Need for SNF placement.  Possible DC tomorrow if tremors improve.   Consultants: None.  ED provider curbsided with neurosurgery on-call Procedures: None  Antimicrobials: None   Subjective: Patient seen and examined at bedside.  Poor historian.  Nursing staff reports increasing tremors yesterday. Tremor better this morning as per the patient.  No fever or shortness of breath reported  objective: Vitals:   04/08/24 1751 04/08/24 2053 04/09/24 0500 04/09/24 0635  BP: (!) 155/72 139/78  (!) 146/76  Pulse: 88 71  64  Resp:  18  18  Temp: 98.7 F (37.1 C) (!) 97.5 F (36.4 C)  98.1 F (36.7 C)  TempSrc:      SpO2: 99% 100%  100%  Weight:   73.8 kg   Height:         Intake/Output Summary (Last 24 hours) at 04/09/2024 0811 Last data filed at 04/09/2024 0600 Gross per 24 hour  Intake 240 ml  Output 625 ml  Net -385 ml   Filed Weights   04/06/24 0500 04/08/24 0500 04/09/24 0500  Weight: 70.4 kg 71 kg 73.8 kg    Examination:  General: On room air and currently in no distress.  Still extremely slow respond but answers some questions.  Poor historian.  Hand tremors improving respiratory: Bilateral decreased breath sounds at bases with some crackles CVS: Rate currently controlled; S1 and S2 are heard abdominal: Soft, nontender, mildly distended; no organomegaly; bowel sounds heard normally extremities: No clubbing; mild lower extremity edema present      Data Reviewed: I have personally reviewed following labs and imaging studies  CBC: Recent Labs  Lab 04/02/24 0841 04/03/24 1453 04/04/24 1555  WBC 8.4 8.4 6.8  NEUTROABS 6.4 6.3  --   HGB 15.9 16.2 15.7  HCT 49.7 48.2 48.5  MCV 85.2 82.7 84.3  PLT 295 303 273   Basic Metabolic Panel: Recent Labs  Lab 04/03/24 1453 04/04/24 1555 04/05/24 0850 04/06/24 0356 04/07/24 0317  NA 138 139 139 142 141  K 3.9 3.9 4.1 3.9 5.1  CL 104 105 108 109 108  CO2 18* 20* 18* 23 22  GLUCOSE 145* 167* 135* 101* 133*  BUN 26* 28* 31* 29* 29*  CREATININE 1.69* 2.00* 2.01* 1.87* 1.68*  CALCIUM  10.1 9.4 9.2 9.0 9.3  MG  --  2.1 2.2 2.1 2.1  PHOS  --  3.2  --   --   --    GFR: Estimated Creatinine Clearance: 35.3 mL/min (A) (by C-G formula based on SCr of 1.68 mg/dL (H)). Liver Function Tests: Recent Labs  Lab 04/04/24 1555  AST 22  ALT 13  ALKPHOS 181*  BILITOT 0.5  PROT 6.5  ALBUMIN 4.1   No results for input(s): LIPASE, AMYLASE in the last 168 hours. Recent Labs  Lab 04/04/24 0026 04/08/24 1707  AMMONIA 16 25   Coagulation Profile: No results for input(s): INR, PROTIME in the last 168 hours. Cardiac Enzymes: No results for input(s): CKTOTAL, CKMB, CKMBINDEX,  TROPONINI in the last 168 hours. BNP (last 3 results) No results for input(s): PROBNP in the last 8760 hours. HbA1C: No results for input(s): HGBA1C in the last 72 hours. CBG: Recent Labs  Lab 04/07/24 1924 04/08/24 0024  GLUCAP 167* 153*   Lipid Profile: No results for input(s): CHOL, HDL, LDLCALC, TRIG, CHOLHDL, LDLDIRECT in the last 72 hours. Thyroid  Function Tests: Recent Labs    04/08/24 1707  TSH 0.897    Anemia Panel: No results for input(s): VITAMINB12, FOLATE, FERRITIN, TIBC, IRON, RETICCTPCT in the last 72 hours. Sepsis Labs: No results for input(s): PROCALCITON, LATICACIDVEN in the last 168 hours.  No results found for this or any previous visit (from the past 240 hours).       Radiology Studies: No results found.        Scheduled Meds:  amLODipine  5 mg Oral Daily   aspirin EC  81 mg Oral Daily   carbidopa-levodopa  1 tablet Oral TID   cholecalciferol  5,000 Units Oral Daily   ezetimibe   10 mg Oral Daily   gabapentin   100 mg Oral BID  lidocaine   2 patch Transdermal Q24H   memantine   28 mg Oral Daily   pantoprazole  40 mg Oral Daily   primidone  50 mg Oral QHS   senna-docusate  1 tablet Oral BID   tamsulosin   0.4 mg Oral Daily   Continuous Infusions:          Sophie Mao, MD [facility] 04/09/2024, 8:11 AM

## 2024-04-10 DIAGNOSIS — S32010A Wedge compression fracture of first lumbar vertebra, initial encounter for closed fracture: Secondary | ICD-10-CM | POA: Diagnosis not present

## 2024-04-10 DIAGNOSIS — N189 Chronic kidney disease, unspecified: Secondary | ICD-10-CM

## 2024-04-10 DIAGNOSIS — T426X4A Poisoning by other antiepileptic and sedative-hypnotic drugs, undetermined, initial encounter: Secondary | ICD-10-CM

## 2024-04-10 LAB — CBC WITH DIFFERENTIAL/PLATELET
Abs Immature Granulocytes: 0.02 K/uL (ref 0.00–0.07)
Basophils Absolute: 0.1 K/uL (ref 0.0–0.1)
Basophils Relative: 1 %
Eosinophils Absolute: 0.6 K/uL — ABNORMAL HIGH (ref 0.0–0.5)
Eosinophils Relative: 8 %
HCT: 45.1 % (ref 39.0–52.0)
Hemoglobin: 15.2 g/dL (ref 13.0–17.0)
Immature Granulocytes: 0 %
Lymphocytes Relative: 21 %
Lymphs Abs: 1.7 K/uL (ref 0.7–4.0)
MCH: 27.7 pg (ref 26.0–34.0)
MCHC: 33.7 g/dL (ref 30.0–36.0)
MCV: 82.1 fL (ref 80.0–100.0)
Monocytes Absolute: 0.6 K/uL (ref 0.1–1.0)
Monocytes Relative: 8 %
Neutro Abs: 4.8 K/uL (ref 1.7–7.7)
Neutrophils Relative %: 62 %
Platelets: 267 K/uL (ref 150–400)
RBC: 5.49 MIL/uL (ref 4.22–5.81)
RDW: 13 % (ref 11.5–15.5)
WBC: 7.8 K/uL (ref 4.0–10.5)
nRBC: 0 % (ref 0.0–0.2)

## 2024-04-10 LAB — BASIC METABOLIC PANEL WITH GFR
Anion gap: 12 (ref 5–15)
BUN: 22 mg/dL (ref 8–23)
CO2: 23 mmol/L (ref 22–32)
Calcium: 9.3 mg/dL (ref 8.9–10.3)
Chloride: 101 mmol/L (ref 98–111)
Creatinine, Ser: 1.56 mg/dL — ABNORMAL HIGH (ref 0.61–1.24)
GFR, Estimated: 47 mL/min — ABNORMAL LOW (ref >60)
Glucose, Bld: 121 mg/dL — ABNORMAL HIGH (ref 70–99)
Potassium: 3.9 mmol/L (ref 3.5–5.1)
Sodium: 136 mmol/L (ref 135–145)

## 2024-04-10 LAB — MAGNESIUM: Magnesium: 2.2 mg/dL (ref 1.7–2.4)

## 2024-04-10 LAB — COPPER, SERUM: Copper: 95 ug/dL (ref 69–132)

## 2024-04-10 MED ORDER — LIDOCAINE 5 % EX PTCH: 2.0000 | MEDICATED_PATCH | CUTANEOUS | 0 refills | Status: DC

## 2024-04-10 MED ORDER — GABAPENTIN 100 MG PO CAPS: 100.0000 mg | ORAL_CAPSULE | Freq: Two times a day (BID) | ORAL | Status: DC

## 2024-04-10 MED ORDER — ENALAPRIL MALEATE 2.5 MG PO TABS
2.5000 mg | ORAL_TABLET | Freq: Every day | ORAL | Status: DC
  Administered 2024-04-10: 2.5 mg via ORAL
  Filled 2024-04-10: qty 1

## 2024-04-10 MED ORDER — CARBIDOPA-LEVODOPA 25-100 MG PO TABS: 1.0000 | ORAL_TABLET | Freq: Three times a day (TID) | ORAL | Status: DC

## 2024-04-10 MED ORDER — DONEPEZIL HCL 10 MG PO TABS
5.0000 mg | ORAL_TABLET | Freq: Every day | ORAL | Status: DC
  Administered 2024-04-10: 5 mg via ORAL
  Filled 2024-04-10: qty 1

## 2024-04-10 MED ORDER — HYDROCODONE-ACETAMINOPHEN 5-325 MG PO TABS: 1.0000 | ORAL_TABLET | ORAL | 0 refills | Status: DC | PRN

## 2024-04-10 MED ORDER — MAGNESIUM OXIDE -MG SUPPLEMENT 400 (240 MG) MG PO TABS
200.0000 mg | ORAL_TABLET | Freq: Every day | ORAL | Status: DC
  Administered 2024-04-10: 200 mg via ORAL
  Filled 2024-04-10: qty 1

## 2024-04-10 MED ORDER — ENALAPRIL MALEATE 10 MG PO TABS: 10.0000 mg | ORAL_TABLET | Freq: Every day | ORAL | Status: DC

## 2024-04-10 MED ORDER — TRAMADOL HCL 50 MG PO TABS: 50.0000 mg | ORAL_TABLET | Freq: Three times a day (TID) | ORAL | 0 refills | Status: DC

## 2024-04-10 MED ORDER — MAGNESIUM 250 MG PO TABS: 250.0000 mg | ORAL_TABLET | Freq: Every day | ORAL | Status: DC
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
## 2024-04-10 NOTE — TOC Transition Note (Signed)
 Transition of Care [facility]) - Discharge Note   Patient Details  Name: [name] MRN: [id number] Date of Birth: 22-May-1951  [facility]) CM/SW Contact:  NORMAN ASPEN, LCSW Phone Number: 04/10/2024, 12:46 PM   Clinical Narrative:     Have received insurance auth and medical clearance for pt to dc to [facility] today.  Pt and spouse aware and agreeable.  PTAR called at 12:40pm.  RN to call report to [phone].  No further IP CM needs.  Final next level of care: Skilled Nursing Facility Barriers to Discharge: Barriers Resolved   Patient Goals and CMS Choice Patient states their goals for this hospitalization and ongoing recovery are:: snf CMS Medicare.gov Compare Post Acute Care list provided to:: Other (Comment Required) Aaiden Depoy (brother))   [place] ownership interest in [facility].provided to:: Sibling    Discharge Placement              Patient chooses bed at: [facility] Patient to be transferred to facility by: PTAR Name of family member notified: spouse Patient and family notified of of transfer: 04/10/24  Discharge Plan and Services Additional resources added to the After Visit Summary for     Discharge Planning Services: CM Consult            DME Arranged: N/A DME Agency: NA       HH Arranged: NA HH Agency: NA Date HH Agency Contacted: 04/02/24   Representative spoke with at [facility] Agency: [place]  Social Drivers of Health (SDOH) Interventions SDOH Screenings   Food Insecurity: No Food Insecurity (04/05/2024)  Housing: Low Risk  (04/05/2024)  Transportation Needs: No Transportation Needs (04/05/2024)  Utilities: Not At Risk (04/05/2024)  Depression (PHQ2-9): Low Risk  (04/12/2023)  Financial Resource Strain: Low Risk  (09/28/2023)   [facility] from [facility]  Social Connections: Moderately Integrated (04/04/2024)  Tobacco Use: Low Risk  (04/04/2024)     Readmission Risk Interventions    04/10/2024   12:44 PM   Readmission Risk Prevention Plan  Post Dischage Appt Complete  Medication Screening Complete  Transportation Screening Complete

## 2024-04-10 NOTE — Final Progress Note (Signed)
Report given to RN at [facility]. 

## 2024-04-10 NOTE — Progress Notes (Signed)
 [facility] PROGRESS NOTE    Progress Note  [name]  FMW:[id number] DOB: 07/02/1950 DOA: 04/02/2024 PCP: Aletha Bene, MD     Brief Narrative:   [name] is an 73 y.o. male past medical history of chronic kidney stage III, diabetes mellitus type 2 with peripheral neuropathy, hyperlipidemia, essential hypertension, mild cognitive impairment, parkinsonism features, comes to the ED for altered mental status and abnormal gait.  MRI of the brain showed no acute findings, MRI of the lumbar spine showed an acute compression fracture of L1 with about 25% height loss, with no significant retropulsion, there is a chronic T12 compression fracture.  CT of the cervical spine done 2 days prior to admission showed no acute abnormalities.  The ED physician discussed the case with the neurosurgeon on-call who recommended conservative management and follow-up with them as an outpatient.  PT evaluated the patient, she is awaiting skilled nursing facility.  Assessment/Plan:   Acute closed compression fracture of body of L1 vertebra (HCC)/back pain/neck pain: MRI of the cervical spine showed moderate stenosis at C3-C4 and C5 C7. Case discussed with the neurosurgeon recommended conservative management with possible TLSO brace pain control and physical therapy.  Essential hypertension/hyperlipidemia: Continue current home regimen. Start enalapril  at 2.5 titrate as tolerated.  Chronic kidney disease stage IIIb: With a baseline creatinine around 1.5-1.8. Resume enalapril .  Diabetes mellitus type 2: Continue sliding scale insulin .  Parkinson features/tremors: Patient was recently started on gabapentin  by neurology for tremors.  Neurology was consulted in 04/08/2024 and his Neurontin  was increased to twice a day. A trial of Sinemet was started as well. DC primidone.  Mild cognitive disorder: Continue memantine .  Peripheral neuropathy: Continue Neurontin .  Elevated troponins: Likely  demand ischemia.    DVT prophylaxis: lovenox Family Communication:none Status is: Inpatient Remains inpatient appropriate because: Compression fracture    Code Status:     Code Status Orders  (From admission, onward)           Start     Ordered   04/04/24 0620  Full code  Continuous       Question:  By:  Answer:  Consent: discussion documented in EHR   04/04/24 0619           Code Status History     This patient has a current code status but no historical code status.         IV Access:   Peripheral IV   Procedures and diagnostic studies:   No results found.   Medical Consultants:   None.   Subjective:    [name] no complaints today  Objective:    Vitals:   04/09/24 1418 04/09/24 2152 04/10/24 0500 04/10/24 0553  BP: (!) 148/81 (!) 168/74  (!) 161/80  Pulse: 74 84  73  Resp: 14 17  16   Temp:  97.9 F (36.6 C)  99.5 F (37.5 C)  TempSrc:  Oral  Oral  SpO2: 100% 100%  99%  Weight:   71.6 kg   Height:       SpO2: 99 %   Intake/Output Summary (Last 24 hours) at 04/10/2024 0918 Last data filed at 04/10/2024 0600 Gross per 24 hour  Intake 840 ml  Output 1175 ml  Net -335 ml   Filed Weights   04/08/24 0500 04/09/24 0500 04/10/24 0500  Weight: 71 kg 73.8 kg 71.6 kg    Exam: General exam: In no acute distress. Respiratory system: Good air movement and clear to auscultation. Cardiovascular system: S1 &  S2 heard, RRR. No JVD. Gastrointestinal system: Abdomen is nondistended, soft and nontender.  Extremities: No pedal edema. Skin: No rashes, lesions or ulcers Psychiatry: Judgement and insight appear normal. Mood & affect appropriate.    Data Reviewed:    Labs: Basic Metabolic Panel: Recent Labs  Lab 04/04/24 1555 04/05/24 0850 04/06/24 0356 04/07/24 0317 04/09/24 0737 04/10/24 0336  NA 139 139 142 141 135 136  K 3.9 4.1 3.9 5.1 4.0 3.9  CL 105 108 109 108 101 101  CO2 20* 18* 23 22 22 23   GLUCOSE 167* 135*  101* 133* 120* 121*  BUN 28* 31* 29* 29* 21 22  CREATININE 2.00* 2.01* 1.87* 1.68* 1.61* 1.56*  CALCIUM  9.4 9.2 9.0 9.3 9.5 9.3  MG 2.1 2.2 2.1 2.1 2.3 2.2  PHOS 3.2  --   --   --   --   --    GFR Estimated Creatinine Clearance: 38.1 mL/min (A) (by C-G formula based on SCr of 1.56 mg/dL (H)). Liver Function Tests: Recent Labs  Lab 04/04/24 1555  AST 22  ALT 13  ALKPHOS 181*  BILITOT 0.5  PROT 6.5  ALBUMIN 4.1   No results for input(s): LIPASE, AMYLASE in the last 168 hours. Recent Labs  Lab 04/04/24 0026 04/08/24 1707  AMMONIA 16 25   Coagulation profile No results for input(s): INR, PROTIME in the last 168 hours. COVID-19 Labs  No results for input(s): DDIMER, FERRITIN, LDH, CRP in the last 72 hours.  Lab Results  Component Value Date   SARSCOV2NAA NEGATIVE 07/17/2019    CBC: Recent Labs  Lab 04/03/24 1453 04/04/24 1555 04/10/24 0336  WBC 8.4 6.8 7.8  NEUTROABS 6.3  --  4.8  HGB 16.2 15.7 15.2  HCT 48.2 48.5 45.1  MCV 82.7 84.3 82.1  PLT 303 273 267   Cardiac Enzymes: No results for input(s): CKTOTAL, CKMB, CKMBINDEX, TROPONINI in the last 168 hours. BNP (last 3 results) No results for input(s): PROBNP in the last 8760 hours. CBG: Recent Labs  Lab 04/07/24 1924 04/08/24 0024  GLUCAP 167* 153*   D-Dimer: No results for input(s): DDIMER in the last 72 hours. Hgb A1c: No results for input(s): HGBA1C in the last 72 hours. Lipid Profile: No results for input(s): CHOL, HDL, LDLCALC, TRIG, CHOLHDL, LDLDIRECT in the last 72 hours. Thyroid  function studies: Recent Labs    04/08/24 1707  TSH 0.897   Anemia work up: No results for input(s): VITAMINB12, FOLATE, FERRITIN, TIBC, IRON, RETICCTPCT in the last 72 hours. Sepsis Labs: Recent Labs  Lab 04/03/24 1453 04/04/24 1555 04/10/24 0336  WBC 8.4 6.8 7.8   Microbiology No results found for this or any previous visit (from the past 240  hours).   Medications:    amLODipine  5 mg Oral Daily   aspirin EC  81 mg Oral Daily   carbidopa-levodopa  1 tablet Oral TID   cholecalciferol  5,000 Units Oral Daily   ezetimibe   10 mg Oral Daily   gabapentin   100 mg Oral BID   lidocaine   2 patch Transdermal Q24H   memantine   28 mg Oral Daily   pantoprazole  40 mg Oral Daily   senna-docusate  1 tablet Oral BID   tamsulosin   0.4 mg Oral Daily   Continuous Infusions:    LOS: 5 days   [name]  [facility]  04/10/2024, 9:18 AM

## 2024-04-10 NOTE — Progress Notes (Signed)
 NEUROLOGY CONSULT FOLLOW UP NOTE   Date of service: April 10, 2024 Patient Name: [name] MRN:  [id number] DOB:  08-May-1951  Interval Hx/subjective   No family at the bedside. RN at the bedside. Patient with no new neurological events overnight  He states he feels like his tremors are better but they are still occurring   Vitals   Vitals:   04/09/24 1418 04/09/24 2152 04/10/24 0500 04/10/24 0553  BP: (!) 148/81 (!) 168/74  (!) 161/80  Pulse: 74 84  73  Resp: 14 17  16   Temp:  97.9 F (36.6 C)  99.5 F (37.5 C)  TempSrc:  Oral  Oral  SpO2: 100% 100%  99%  Weight:   71.6 kg   Height:         Body mass index is 25.48 kg/m.  Physical Exam   Constitutional: Appears well-developed and well-nourished.  Psych: Affect appropriate to situation.  Eyes: No scleral injection.  HENT: No OP obstrucion.  Head: Normocephalic.  Cardiovascular: Normal rate and regular rhythm.  Respiratory: Effort normal, non-labored breathing.  GI: Soft.  No distension. There is no tenderness.  Skin: WDI.   Neurologic Examination   Mental Status -  Awake and alert, oriented to self, month and age and year.  Cranial Nerves II - XII - II - Visual field intact OU . III, IV, VI - Extraocular movements intact . V - Facial sensation intact bilaterally . VII - Facial movement intact bilaterally . VIII - Hearing & vestibular intact bilaterally . X - Palate elevates symmetrically . XI - Chin turning & shoulder shrug intact bilaterally . XII - Tongue protrusion intact .  Motor Strength - bilateral uppers 5/5, bilateral lowers 4/5. Tremors noted in all 4 extremities. Cogwheeling in bilateral uppers R>L, bilateral lowers with increased tone  Bulk was normal and fasciculations were absent .   Sensory - intact bilaterally to light touch  Coordination - The patient had normal movements in the hands and feet with no ataxia or dysmetria.  Tremor was absent.  Gait and Station -  deferred.  Medications  Current Facility-Administered Medications:    acetaminophen  (TYLENOL ) tablet 650 mg, 650 mg, Oral, Q6H PRN, 650 mg at 04/08/24 1622 **OR** acetaminophen  (TYLENOL ) suppository 650 mg, 650 mg, Rectal, Q6H PRN, [name], [name], DO   alum & mag hydroxide-simeth (MAALOX/MYLANTA) 200-200-20 MG/5ML suspension 30 mL, 30 mL, Oral, Q6H PRN, [name], [name], MD, 30 mL at 04/08/24 0055   amLODipine (NORVASC) tablet 5 mg, 5 mg, Oral, Daily, [name], [name], MD, 5 mg at 04/10/24 1024   artificial tears ophthalmic solution 2 drop, 2 drop, Both Eyes, PRN, [name], [name], MD, 2 drop at 04/08/24 0300   aspirin EC tablet 81 mg, 81 mg, Oral, Daily, [name], [name], MD, 81 mg at 04/10/24 1024   bisacodyl (DULCOLAX) suppository 10 mg, 10 mg, Rectal, Daily PRN, [name], [name], MD, 10 mg at 04/08/24 0036   carbidopa-levodopa (SINEMET IR) 25-100 MG per tablet immediate release 1 tablet, 1 tablet, Oral, TID, [name], [name], MD, 1 tablet at 04/10/24 1024   cholecalciferol (VITAMIN D3) 25 MCG (1000 UNIT) tablet 5,000 Units, 5,000 Units, Oral, Daily, [name], [name], MD, 5,000 Units at 04/10/24 1023   donepezil  (ARICEPT ) tablet 5 mg, 5 mg, Oral, Daily, [name], MD, 5 mg at 04/10/24 1024   enalapril  (VASOTEC ) tablet 2.5 mg, 2.5 mg, Oral, Daily, [name], MD, 2.5 mg at 04/10/24 1024   ezetimibe  (ZETIA ) tablet 10 mg, 10 mg, Oral,  Daily, [name], MD, 10 mg at 04/10/24 1024   gabapentin  (NEURONTIN ) capsule 100 mg, 100 mg, Oral, BID, [name], [name], MD, 100 mg at 04/10/24 1024   HYDROcodone -acetaminophen  (NORCO/VICODIN) 5-325 MG per tablet 1-2 tablet, 1-2 tablet, Oral, Q6H PRN, [name], [name], MD   lidocaine  (LIDODERM ) 5 % 2 patch, 2 patch, Transdermal, Q24H, [name], [name], MD, 2 patch at 04/10/24 0847   magnesium oxide (MAG-OX) tablet 200 mg, 200 mg, Oral, Daily, [name], MD, 200 mg at 04/10/24 1024   melatonin tablet 3 mg, 3 mg, Oral, QHS PRN, [name], [name],  DO, 3 mg at 04/09/24 2148   memantine  (NAMENDA  XR) 24 hr capsule 28 mg, 28 mg, Oral, Daily, [name], MD, 28 mg at 04/10/24 1025   ondansetron  (ZOFRAN ) injection 4 mg, 4 mg, Intravenous, Q6H PRN, [name], [name], DO, 4 mg at 04/08/24 0957   pantoprazole (PROTONIX) EC tablet 40 mg, 40 mg, Oral, Daily, [name], [name], MD, 40 mg at 04/10/24 1024   polyethylene glycol (MIRALAX / GLYCOLAX) packet 17 g, 17 g, Oral, Daily PRN, [name], NP, 17 g at 04/07/24 1017   senna-docusate (Senokot-S) tablet 1 tablet, 1 tablet, Oral, BID, [name], [name], MD, 1 tablet at 04/10/24 1024   tamsulosin  (FLOMAX ) capsule 0.4 mg, 0.4 mg, Oral, Daily, [name], MD, 0.4 mg at 04/10/24 1024  Labs and Diagnostic Imaging   CBC:  Recent Labs  Lab 04/03/24 1453 04/04/24 1555 04/10/24 0336  WBC 8.4 6.8 7.8  NEUTROABS 6.3  --  4.8  HGB 16.2 15.7 15.2  HCT 48.2 48.5 45.1  MCV 82.7 84.3 82.1  PLT 303 273 267    Basic Metabolic Panel:  Lab Results  Component Value Date   NA 136 04/10/2024   K 3.9 04/10/2024   CO2 23 04/10/2024   GLUCOSE 121 (H) 04/10/2024   BUN 22 04/10/2024   CREATININE 1.56 (H) 04/10/2024   CALCIUM  9.3 04/10/2024   GFRNONAA 47 (L) 04/10/2024   GFRAA 49 (L) 12/02/2020   Lipid Panel:  Lab Results  Component Value Date   LDLCALC 94 12/06/2023   HgbA1c:  Lab Results  Component Value Date   HGBA1C 6.3 (H) 12/06/2023   Urine Drug Screen: No results found for: LABOPIA, COCAINSCRNUR, LABBENZ, AMPHETMU, THCU, LABBARB  Alcohol Level No results found for: [facility] INR  Lab Results  Component Value Date   INR 1.01 07/31/2009   APTT  Lab Results  Component Value Date   APTT 29 07/31/2009   AED levels: No results found for: PHENYTOIN, ZONISAMIDE, LAMOTRIGINE, LEVETIRACETA    CT head  With no acute process   MRI Brain(Personally reviewed): MR brain without contrast: No acute findings  MRI lumbar spine with acute compression fracture of L1  superior endplate and up to 35% height loss but no significant retropulsion.  Chronic compression fracture of T12 with up to 50% height loss and trace bony retropulsion.  Underlying DJD. MRI of the C-spine: Chronic degenerative disease at C3-4 with mild to moderate central spinal canal stenosis and moderate right neural foraminal stenosis.  Broad-based disc bulging at C6-7 with mild to moderate central canal spinal stenosis.  Neuroforamina are patent.  Assessment   [name] is a 73 y.o. 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 Neurology was consulted for worsening tremors.  Reports that worsening tremors is not new it has been going on for past few months. He has not tried Sinemet-his exam has significant parkinsonian findings and I feel a trial of Sinemet might be helpful at this time.  Gabapentin  toxicity might of caused exacerbation of tremors 2.     Impression: Parkinsonian symptoms, question primary Parkinson's.  Worsening of symptoms in the setting of CKD and gabapentin .   Recommendations  - continue Sinemet  - continue gabapentin   - PT/OT  - Follow up with outpatient neurology in 1 month after discharge   _- Neurology will sign off Please call with questions or concerns  _____________________________________________________________________   Signed, Karna DELENA Geralds, NP [facility]

## 2024-04-23 ENCOUNTER — Emergency Department (HOSPITAL_COMMUNITY)

## 2024-04-23 ENCOUNTER — Inpatient Hospital Stay (HOSPITAL_COMMUNITY)
Admission: EM | Admit: 2024-04-23 | Discharge: 2024-05-03 | DRG: 093 | Disposition: A | Discharge status: Skilled Nursing Facility | Attending: Internal Medicine | Admitting: Internal Medicine

## 2024-04-23 ENCOUNTER — Encounter (HOSPITAL_COMMUNITY): Payer: Self-pay

## 2024-04-23 ENCOUNTER — Other Ambulatory Visit: Payer: Self-pay

## 2024-04-23 DIAGNOSIS — E860 Dehydration: Secondary | ICD-10-CM | POA: Diagnosis present

## 2024-04-23 DIAGNOSIS — R251 Tremor, unspecified: Principal | ICD-10-CM | POA: Diagnosis present

## 2024-04-23 DIAGNOSIS — Z820 Family history of epilepsy and other diseases of the nervous system: Secondary | ICD-10-CM

## 2024-04-23 DIAGNOSIS — I129 Hypertensive chronic kidney disease with stage 1 through stage 4 chronic kidney disease, or unspecified chronic kidney disease: Secondary | ICD-10-CM | POA: Diagnosis present

## 2024-04-23 DIAGNOSIS — E1142 Type 2 diabetes mellitus with diabetic polyneuropathy: Secondary | ICD-10-CM | POA: Diagnosis present

## 2024-04-23 DIAGNOSIS — T426X5A Adverse effect of other antiepileptic and sedative-hypnotic drugs, initial encounter: Secondary | ICD-10-CM

## 2024-04-23 DIAGNOSIS — Z7982 Long term (current) use of aspirin: Secondary | ICD-10-CM

## 2024-04-23 DIAGNOSIS — Z880 Allergy status to penicillin: Secondary | ICD-10-CM

## 2024-04-23 DIAGNOSIS — T50995A Adverse effect of other drugs, medicaments and biological substances, initial encounter: Secondary | ICD-10-CM | POA: Diagnosis present

## 2024-04-23 DIAGNOSIS — T40425A Adverse effect of tramadol, initial encounter: Secondary | ICD-10-CM | POA: Diagnosis not present

## 2024-04-23 DIAGNOSIS — R29810 Facial weakness: Secondary | ICD-10-CM | POA: Diagnosis present

## 2024-04-23 DIAGNOSIS — R4182 Altered mental status, unspecified: Secondary | ICD-10-CM | POA: Diagnosis not present

## 2024-04-23 DIAGNOSIS — K5900 Constipation, unspecified: Secondary | ICD-10-CM | POA: Diagnosis present

## 2024-04-23 DIAGNOSIS — N1832 Chronic kidney disease, stage 3b: Secondary | ICD-10-CM | POA: Diagnosis present

## 2024-04-23 DIAGNOSIS — Z79899 Other long term (current) drug therapy: Secondary | ICD-10-CM

## 2024-04-23 DIAGNOSIS — N400 Enlarged prostate without lower urinary tract symptoms: Secondary | ICD-10-CM | POA: Diagnosis present

## 2024-04-23 DIAGNOSIS — Z888 Allergy status to other drugs, medicaments and biological substances status: Secondary | ICD-10-CM

## 2024-04-23 DIAGNOSIS — Z8419 Family history of other disorders of kidney and ureter: Secondary | ICD-10-CM

## 2024-04-23 DIAGNOSIS — N179 Acute kidney failure, unspecified: Secondary | ICD-10-CM | POA: Diagnosis present

## 2024-04-23 DIAGNOSIS — E1122 Type 2 diabetes mellitus with diabetic chronic kidney disease: Secondary | ICD-10-CM | POA: Diagnosis present

## 2024-04-23 DIAGNOSIS — M109 Gout, unspecified: Secondary | ICD-10-CM | POA: Diagnosis present

## 2024-04-23 DIAGNOSIS — E785 Hyperlipidemia, unspecified: Secondary | ICD-10-CM | POA: Diagnosis present

## 2024-04-23 DIAGNOSIS — G928 Other toxic encephalopathy: Secondary | ICD-10-CM | POA: Diagnosis not present

## 2024-04-23 DIAGNOSIS — Z9842 Cataract extraction status, left eye: Secondary | ICD-10-CM

## 2024-04-23 DIAGNOSIS — Z91048 Other nonmedicinal substance allergy status: Secondary | ICD-10-CM

## 2024-04-23 DIAGNOSIS — Z8249 Family history of ischemic heart disease and other diseases of the circulatory system: Secondary | ICD-10-CM

## 2024-04-23 LAB — COMPREHENSIVE METABOLIC PANEL WITH GFR
ALT: 6 U/L (ref 0–44)
AST: 21 U/L (ref 15–41)
Albumin: 4.1 g/dL (ref 3.5–5.0)
Alkaline Phosphatase: 115 U/L (ref 38–126)
Anion gap: 10 (ref 5–15)
BUN: 23 mg/dL (ref 8–23)
CO2: 26 mmol/L (ref 22–32)
Calcium: 9.4 mg/dL (ref 8.9–10.3)
Chloride: 102 mmol/L (ref 98–111)
Creatinine, Ser: 2 mg/dL — ABNORMAL HIGH (ref 0.61–1.24)
GFR, Estimated: 35 mL/min — ABNORMAL LOW (ref >60)
Glucose, Bld: 140 mg/dL — ABNORMAL HIGH (ref 70–99)
Potassium: 3.7 mmol/L (ref 3.5–5.1)
Sodium: 138 mmol/L (ref 135–145)
Total Bilirubin: 0.8 mg/dL (ref 0.0–1.2)
Total Protein: 6.8 g/dL (ref 6.5–8.1)

## 2024-04-23 LAB — CBC
HCT: 50.2 % (ref 39.0–52.0)
Hemoglobin: 16.5 g/dL (ref 13.0–17.0)
MCH: 27.8 pg (ref 26.0–34.0)
MCHC: 32.9 g/dL (ref 30.0–36.0)
MCV: 84.5 fL (ref 80.0–100.0)
Platelets: 261 K/uL (ref 150–400)
RBC: 5.94 MIL/uL — ABNORMAL HIGH (ref 4.22–5.81)
RDW: 13 % (ref 11.5–15.5)
WBC: 9.1 K/uL (ref 4.0–10.5)
nRBC: 0 % (ref 0.0–0.2)

## 2024-04-23 LAB — URINALYSIS, ROUTINE W REFLEX MICROSCOPIC
Bilirubin Urine: NEGATIVE
Glucose, UA: NEGATIVE mg/dL
Hgb urine dipstick: NEGATIVE
Ketones, ur: NEGATIVE mg/dL
Leukocytes,Ua: NEGATIVE
Nitrite: NEGATIVE
Protein, ur: NEGATIVE mg/dL
Specific Gravity, Urine: 1.015 (ref 1.005–1.030)
pH: 5 (ref 5.0–8.0)

## 2024-04-23 MED ORDER — POLYETHYLENE GLYCOL 3350 17 G PO PACK: 17.0000 g | PACK | Freq: Every day | ORAL | Status: DC | PRN

## 2024-04-23 MED ORDER — TRAMADOL HCL 50 MG PO TABS: 50.0000 mg | ORAL_TABLET | Freq: Three times a day (TID) | ORAL | Status: DC | PRN

## 2024-04-23 MED ORDER — MELATONIN 5 MG PO TABS
5.0000 mg | ORAL_TABLET | Freq: Every evening | ORAL | Status: DC | PRN
Start: 2024-04-23 — End: 2024-05-03
  Administered 2024-04-24 – 2024-05-02 (×7): 5 mg via ORAL
  Filled 2024-04-23 (×7): qty 1

## 2024-04-23 MED ORDER — SODIUM CHLORIDE 0.9 % IV BOLUS
1000.0000 mL | Freq: Once | INTRAVENOUS | Status: AC
  Administered 2024-04-23: 1000 mL via INTRAVENOUS

## 2024-04-23 MED ORDER — ENOXAPARIN SODIUM 30 MG/0.3ML IJ SOSY
30.0000 mg | PREFILLED_SYRINGE | INTRAMUSCULAR | Status: DC
  Administered 2024-04-23 – 2024-04-24 (×2): 30 mg via SUBCUTANEOUS
  Filled 2024-04-23 (×2): qty 0.3

## 2024-04-23 MED ORDER — PROCHLORPERAZINE EDISYLATE 10 MG/2ML IJ SOLN
5.0000 mg | Freq: Four times a day (QID) | INTRAMUSCULAR | Status: DC | PRN
  Administered 2024-04-24: 5 mg via INTRAVENOUS
  Filled 2024-04-23: qty 2

## 2024-04-23 MED ORDER — GABAPENTIN 100 MG PO CAPS: 100.0000 mg | ORAL_CAPSULE | Freq: Two times a day (BID) | ORAL | Status: DC

## 2024-04-23 MED ORDER — ACETAMINOPHEN 500 MG PO TABS
500.0000 mg | ORAL_TABLET | Freq: Four times a day (QID) | ORAL | Status: DC | PRN
  Administered 2024-04-27 – 2024-04-29 (×3): 500 mg via ORAL
  Filled 2024-04-23 (×3): qty 1

## 2024-04-23 MED ORDER — LACTATED RINGERS IV SOLN: INTRAVENOUS | Status: AC

## 2024-04-23 MED ORDER — CARBIDOPA-LEVODOPA 25-100 MG PO TABS
1.0000 | ORAL_TABLET | Freq: Three times a day (TID) | ORAL | Status: DC
  Administered 2024-04-24 – 2024-05-03 (×29): 1 via ORAL
  Filled 2024-04-23 (×29): qty 1

## 2024-04-23 NOTE — ED Triage Notes (Signed)
 Patient bib [facility] from [facility] with more confusion than normal and aphasia. He is also complaining about pressure behind his eyes.   Facility states he was altered yesterday and wife states he is altered today. There is no last known well time.   Unknown baseline due to dementia.

## 2024-04-23 NOTE — ED Provider Notes (Signed)
 [place] EMERGENCY DEPARTMENT AT [facility] Provider Note   CSN: [id number] Arrival date & time: 04/23/24  1444     Patient presents with: Altered Mental Status   [name] is a 73 y.o. male.   11 yo M with a chief complaints of confusion.  Per family the patient was fine until he suffered a fall he was seen in the hospital and spent multiple days here.  Had a significant tremor and some progressive confusion.  Was started on carbidopa levodopa with some improvement.  He is currently in rehab.  Family states that over the past couple days they feel like the tremors have worsened.  He had an episode today where he was acutely confused more than baseline.  Seems to have improved but maybe not back to baseline.  No cough congestion or fever.  States compliance with his medications.  No recurrent trauma.  Family felt like he was having trouble with 1 side of his face and having trouble swallowing.   Altered Mental Status      Prior to Admission medications   Medication Sig Start Date End Date Taking? Authorizing Provider  allopurinol  (ZYLOPRIM ) 300 MG tablet Take 300 mg by mouth daily as needed (for gout flares).    [provider]  aspirin EC 81 MG tablet Take 81 mg by mouth 2 (two) times a week. Swallow whole.    [provider]  carbidopa-levodopa (SINEMET IR) 25-100 MG tablet Take 1 tablet by mouth 3 (three) times daily. 04/10/24   [name], MD  Cholecalciferol (VITAMIN D3 PO) Take 5,000 Units by mouth daily.    [provider]  donepezil  (ARICEPT ) 5 MG tablet Take 5 mg by mouth in the morning.    [provider]  enalapril  (VASOTEC ) 10 MG tablet Take 1 tablet (10 mg total) by mouth daily. Patient taking differently: Take 5 mg by mouth daily as needed (for elevated blood pressure). 12/21/23 12/20/24  Aletha Bene, MD  ezetimibe  (ZETIA ) 10 MG tablet TAKE 1 TABLET BY MOUTH DAILY FOR CHOLESTEROL Patient not taking: Reported  on 04/02/2024 07/03/23   Wilkinson, Dana E, NP  gabapentin  (NEURONTIN ) 100 MG capsule Take 1 capsule (100 mg total) by mouth 2 (two) times daily. 04/10/24   [name], MD  HYDROcodone -acetaminophen  (NORCO/VICODIN) 5-325 MG tablet Take 1-2 tablets by mouth every 4 (four) hours as needed. 04/10/24   [name], MD  lidocaine  (LIDODERM ) 5 % Place 2 patches onto the skin daily. Remove & Discard patch within 12 hours or as directed by MD 04/11/24   [name], MD  Magnesium 250 MG TABS Take 250 mg by mouth daily.    [provider]  memantine  (NAMENDA  XR) 28 MG CP24 24 hr capsule Take 1 capsule (28 mg total) by mouth daily. 01/24/24   [name], MD  methocarbamol (ROBAXIN) 500 MG tablet Take 1,000 mg by mouth See admin instructions. Take 1,000 mg by mouth every five hours    [provider]  tamsulosin  (FLOMAX ) 0.4 MG CAPS capsule Take 1 capsule (0.4 mg total) by mouth daily. Help with urination 03/08/24   Aletha Bene, MD  traMADol (ULTRAM) 50 MG tablet Take 1 tablet (50 mg total) by mouth 3 (three) times daily. 04/10/24   [name], MD    Allergies: Tape, Crestor [rosuvastatin], Welchol [colesevelam hcl], Zoloft [sertraline hcl], and Penicillins    Review of Systems  Updated Vital Signs BP (!) 165/75  Pulse 69   Temp 97.8 F (36.6 C) (Oral)   Resp 13   Ht 5' 6 (1.676 m)   Wt 66.7 kg   SpO2 100%   BMI 23.73 kg/m   Physical Exam Vitals and nursing note reviewed.  Constitutional:      Appearance: He is well-developed.  HENT:     Head: Normocephalic and atraumatic.  Eyes:     Pupils: Pupils are equal, round, and reactive to light.  Neck:     Vascular: No JVD.  Cardiovascular:     Rate and Rhythm: Normal rate and regular rhythm.     Heart sounds: No murmur heard.    No friction rub. No gallop.  Pulmonary:     Effort: No respiratory distress.     Breath sounds: No wheezing.  Abdominal:     General: There is no  distension.     Tenderness: There is no abdominal tenderness. There is no guarding or rebound.  Musculoskeletal:        General: Normal range of motion.     Cervical back: Normal range of motion and neck supple.  Skin:    Coloration: Skin is not pale.     Findings: No rash.  Neurological:     Mental Status: He is alert.     Comments: Confused verbal response.  Is able to answer yes/no questions.  Coarse intention tremor no obvious unilateral weakness.  Perhaps visual field deficit in the left upper quadrant.  Some apathy.  Perhaps mild right-sided facial droop.  Psychiatric:        Behavior: Behavior normal.     (all labs ordered are listed, but only abnormal results are displayed) Labs Reviewed  COMPREHENSIVE METABOLIC PANEL WITH GFR - Abnormal; Notable for the following components:      Result Value   Glucose, Bld 140 (*)    Creatinine, Ser 2.00 (*)    GFR, Estimated 35 (*)    All other components within normal limits  CBC - Abnormal; Notable for the following components:   RBC 5.94 (*)    All other components within normal limits  URINALYSIS, ROUTINE W REFLEX MICROSCOPIC  CBC  CREATININE, SERUM  CBG MONITORING, ED    EKG: None  Radiology: MR BRAIN WO CONTRAST Result Date: 04/23/2024 EXAM: MRI BRAIN WITHOUT CONTRAST 04/23/2024 06:51:06 PM TECHNIQUE: Multiplanar multisequence MRI of the head/brain was performed without the administration of intravenous contrast. COMPARISON: MRI of the head dated 04/03/2024. CLINICAL HISTORY: Neuro deficit, acute, stroke suspected. FINDINGS: BRAIN AND VENTRICLES: Age-related atrophy. Mild-to-moderate cerebral white matter disease. There is no restricted diffusion to indicate acute or recent infarction. No intracranial hemorrhage. No mass. No midline shift. No hydrocephalus. The sella is unremarkable. Normal flow voids. ORBITS: No acute abnormality. SINUSES AND MASTOIDS: No acute abnormality. BONES AND SOFT TISSUES: Normal marrow signal. No  acute soft tissue abnormality. IMPRESSION: 1. No acute infarction. 2. Age-related atrophy and mild-to-moderate cerebral white matter disease. Electronically signed by: Evalene Coho MD 04/23/2024 06:58 PM EST RP Workstation: [id number]   CT Head Wo Contrast Result Date: 04/23/2024 EXAM: CT HEAD WITHOUT CONTRAST 04/23/2024 06:21:00 PM TECHNIQUE: CT of the head was performed without the administration of intravenous contrast. Automated exposure control, iterative reconstruction, and/or weight based adjustment of the mA/kV was utilized to reduce the radiation dose to as low as reasonably achievable. COMPARISON: 04/02/2024 CLINICAL HISTORY: Delirium FINDINGS: BRAIN AND VENTRICLES: No acute hemorrhage. No evidence of acute infarct. Diffuse cerebral atrophy. Chronic small vessel disease throughout the  deep white matter. Old left basal ganglia lacunar infarct, stable. No hydrocephalus. No extra-axial collection. No mass effect or midline shift. ORBITS: No acute abnormality. SINUSES: No acute abnormality. SOFT TISSUES AND SKULL: No acute soft tissue abnormality. No skull fracture. IMPRESSION: 1. No acute intracranial abnormality. 2. Diffuse cerebral atrophy and chronic small vessel disease throughout the deep white matter. 3. Stable old left basal ganglia lacunar infarct. Electronically signed by: Franky Crease MD 04/23/2024 06:26 PM EST RP Workstation: [id number]   DG Chest Port 1 View Result Date: 04/23/2024 EXAM: 1 VIEW(S) XRAY OF THE CHEST 04/23/2024 06:01:00 PM COMPARISON: None available. CLINICAL HISTORY: ams FINDINGS: LUNGS AND PLEURA: Small calcified granuloma in the right upper lung. No pulmonary edema. No pleural effusion. No pneumothorax. HEART AND MEDIASTINUM: Aortic atherosclerosis. No acute abnormality of the cardiac and mediastinal silhouettes. BONES AND SOFT TISSUES: No acute osseous abnormality. IMPRESSION: 1. No acute cardiopulmonary process. Electronically signed by: Luke Bun MD 04/23/2024  06:05 PM EST RP Workstation: [id number]     Procedures   Medications Ordered in the ED  carbidopa-levodopa (SINEMET IR) 25-100 MG per tablet immediate release 1 tablet (has no administration in time range)  enoxaparin (LOVENOX) injection 30 mg (has no administration in time range)  lactated ringers  infusion (has no administration in time range)  traMADol (ULTRAM) tablet 50 mg (has no administration in time range)  acetaminophen  (TYLENOL ) tablet 500 mg (has no administration in time range)  prochlorperazine (COMPAZINE) injection 5 mg (has no administration in time range)  melatonin tablet 5 mg (has no administration in time range)  polyethylene glycol (MIRALAX / GLYCOLAX) packet 17 g (has no administration in time range)  sodium chloride  0.9 % bolus 1,000 mL (1,000 mLs Intravenous New Bag/Given 04/23/24 1910)                                    Medical Decision Making Amount and/or Complexity of Data Reviewed Labs: ordered. Radiology: ordered.  Risk Decision regarding hospitalization.   73 yo M with a chief complaints of an episode of confusion difficulty swallowing and worsening tremors.  The tremors have been worsening over the past couple days.  Family states that they do started after a fall although on my record review the patient had endorsed tremors going on for a few months.  Will obtain a CT of the head.  Blood work.  Likely will repeat MRI. CT of the head without obvious acute intracranial pathology.  MRI of the brain also without obvious acute finding.  I discussed the results with Dr. Vanessa, neuro,  will come and evaluate patient at bedside.    Evaluated by neurology.  Thought to be a medication side effect.  Recommended medical observation and likely PT.  Will discuss with medicine for admission.  The patients results and plan were reviewed and discussed.   Any x-rays performed were independently reviewed by myself.   Differential diagnosis were considered  with the presenting HPI.  Medications  carbidopa-levodopa (SINEMET IR) 25-100 MG per tablet immediate release 1 tablet (has no administration in time range)  enoxaparin (LOVENOX) injection 30 mg (has no administration in time range)  lactated ringers  infusion (has no administration in time range)  traMADol (ULTRAM) tablet 50 mg (has no administration in time range)  acetaminophen  (TYLENOL ) tablet 500 mg (has no administration in time range)  prochlorperazine (COMPAZINE) injection 5 mg (has no administration in time range)  melatonin tablet  5 mg (has no administration in time range)  polyethylene glycol (MIRALAX / GLYCOLAX) packet 17 g (has no administration in time range)  sodium chloride  0.9 % bolus 1,000 mL (1,000 mLs Intravenous New Bag/Given 04/23/24 1910)    Vitals:   04/23/24 1445 04/23/24 1454 04/23/24 2000 04/23/24 2023  BP:  (!) 152/83 (!) 165/75   Pulse:  74 69   Resp:  16 13   Temp:  (!) 97.4 F (36.3 C)  97.8 F (36.6 C)  TempSrc:    Oral  SpO2: 98% 99% 100%   Weight:  66.7 kg    Height:  5' 6 (1.676 m)      Final diagnoses:  Tremor    Admission/ observation were discussed with the admitting physician, patient and/or family and they are comfortable with the plan.       Final diagnoses:  Tremor    ED Discharge Orders     None          Emil Share, DO 04/23/24 2229

## 2024-04-23 NOTE — H&P (Signed)
 History and Physical  [name] FMW:[id number] DOB: 06/28/1950 DOA: 04/23/2024  Referring physician: Dr. Emil, EDP  PCP: Aletha Bene, MD  Outpatient Specialists: Neurology. Patient coming from: SNF.  Chief Complaint: Tremors and confusion.  HPI: [name] is a 73 y.o. male with medical history significant for type 2 diabetes, peripheral neuropathy, hyperlipidemia, hypertension, chronic tremors, mild cognitive impairment, parkinsonian features, who presents to the ER due to worsening tremors for the past 2 days.  Associated with some confusion.  His wife was concerned about possible right facial droop.  In the ER, the patient was seen by neurology/stroke team.  CT head without contrast was nonacute.  MRI brain was negative for acute CVA.  EDP requesting admission for management of tremors.  Admitted by [facility], hospitalist service.  ED Course: Temperature 98.  148/66, pulse 70, respiratory rate 16, O2 saturation 100% on room air.  Review of Systems: Review of systems as noted in the HPI. All other systems reviewed and are negative.   Past Medical History:  Diagnosis Date   Allergy    Bone spur    Left Shoulder   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 (HCC) 07/18/2017   Fracture    right forearm   Gout    Hyperlipidemia    Hypertension    Other testicular hypofunction    PONV (postoperative nausea and vomiting)    Type II or unspecified type diabetes mellitus without mention of complication, not stated as uncontrolled    Past Surgical History:  Procedure Laterality Date   CATARACT EXTRACTION Left 08/2023   CYST REMOVAL HAND     ORIF RADIAL FRACTURE Right 07/18/2019   Procedure: OPEN REDUCTION INTERNAL FIXATION (ORIF) RADIUS AND ULNA FRACTURE;  Surgeon: Kendal Franky SQUIBB, MD;  Location: [facility] OR;  Service: Orthopedics;  Laterality: Right;   PROSTATE BIOPSY  2010   WRIST SURGERY Right 1998    Social History:  reports that he has never smoked. He has never used smokeless  tobacco. He reports that he does not currently use alcohol. He reports that he does not use drugs.   Allergies  Allergen Reactions   Tape Other (See Comments)    SKIN IS VERY THIN. IT TEARS AND BRUISES VERY EASILY!!   Crestor [Rosuvastatin] Diarrhea   Welchol [Colesevelam Hcl] Diarrhea   Zoloft [Sertraline Hcl] Other (See Comments)    Reaction not recalled    Penicillins Hives, Nausea And Vomiting, Swelling and Other (See Comments)    Severe hives and possible swelling of the throat    Family History  Problem Relation Age of Onset   Heart disease Mother 65       stent   Heart disease Father    Kidney disease Father    Heart failure Father    Alzheimer's disease Paternal Grandfather    Colon cancer Neg Hx    Esophageal cancer Neg Hx    Liver cancer Neg Hx    Pancreatic cancer Neg Hx    Rectal cancer Neg Hx    Stomach cancer Neg Hx       Prior to Admission medications   Medication Sig Start Date End Date Taking? Authorizing Provider  allopurinol  (ZYLOPRIM ) 300 MG tablet Take 300 mg by mouth daily as needed (for gout flares).    [provider]  aspirin EC 81 MG tablet Take 81 mg by mouth 2 (two) times a week. Swallow whole.    [provider]  carbidopa-levodopa (SINEMET IR) 25-100 MG tablet  Take 1 tablet by mouth 3 (three) times daily. 04/10/24   [name], MD  Cholecalciferol (VITAMIN D3 PO) Take 5,000 Units by mouth daily.    [provider]  donepezil  (ARICEPT ) 5 MG tablet Take 5 mg by mouth in the morning.    [provider]  enalapril  (VASOTEC ) 10 MG tablet Take 1 tablet (10 mg total) by mouth daily. Patient taking differently: Take 5 mg by mouth daily as needed (for elevated blood pressure). 12/21/23 12/20/24  Aletha Bene, MD  ezetimibe  (ZETIA ) 10 MG tablet TAKE 1 TABLET BY MOUTH DAILY FOR CHOLESTEROL Patient not taking: Reported on 04/02/2024 07/03/23   Wilkinson, Dana E, NP  gabapentin  (NEURONTIN ) 100 MG capsule Take 1  capsule (100 mg total) by mouth 2 (two) times daily. 04/10/24   [name], MD  HYDROcodone -acetaminophen  (NORCO/VICODIN) 5-325 MG tablet Take 1-2 tablets by mouth every 4 (four) hours as needed. 04/10/24   [name], MD  lidocaine  (LIDODERM ) 5 % Place 2 patches onto the skin daily. Remove & Discard patch within 12 hours or as directed by MD 04/11/24   [name], MD  Magnesium 250 MG TABS Take 250 mg by mouth daily.    [provider]  memantine  (NAMENDA  XR) 28 MG CP24 24 hr capsule Take 1 capsule (28 mg total) by mouth daily. 01/24/24   [name], MD  methocarbamol (ROBAXIN) 500 MG tablet Take 1,000 mg by mouth See admin instructions. Take 1,000 mg by mouth every five hours    [provider]  tamsulosin  (FLOMAX ) 0.4 MG CAPS capsule Take 1 capsule (0.4 mg total) by mouth daily. Help with urination 03/08/24   Aletha Bene, MD  traMADol (ULTRAM) 50 MG tablet Take 1 tablet (50 mg total) by mouth 3 (three) times daily. 04/10/24   [name], MD    Physical Exam: BP (!) 165/75   Pulse 69   Temp 97.8 F (36.6 C) (Oral)   Resp 13   Ht 5' 6 (1.676 m)   Wt 66.7 kg   SpO2 100%   BMI 23.73 kg/m   General: 73 y.o. year-old male well developed well nourished in no acute distress.  Alert and responds to questions appropriately. Cardiovascular: Regular rate and rhythm with no rubs or gallops.  No thyromegaly or JVD noted.  No lower extremity edema. 2/4 pulses in all 4 extremities. Respiratory: Clear to auscultation with no wheezes or rales. Good inspiratory effort. Abdomen: Soft nontender nondistended with normal bowel sounds x4 quadrants. Muskuloskeletal: No cyanosis, clubbing or edema noted bilaterally Neuro: CN II-XII intact, strength, sensation, reflexes.  Mild upper extremity tremors. Skin: No ulcerative lesions noted or rashes Psychiatry: Judgement and insight appear normal. Mood is appropriate for condition and setting           Labs on Admission:  Basic Metabolic Panel: Recent Labs  Lab 04/23/24 1534  NA 138  K 3.7  CL 102  CO2 26  GLUCOSE 140*  BUN 23  CREATININE 2.00*  CALCIUM  9.4   Liver Function Tests: Recent Labs  Lab 04/23/24 1534  AST 21  ALT 6  ALKPHOS 115  BILITOT 0.8  PROT 6.8  ALBUMIN 4.1   No results for input(s): LIPASE, AMYLASE in the last 168 hours. No results for input(s): AMMONIA in the last 168 hours. CBC: Recent Labs  Lab 04/23/24 1534  WBC 9.1  HGB 16.5  HCT 50.2  MCV 84.5  PLT 261   Cardiac Enzymes: No  results for input(s): CKTOTAL, CKMB, CKMBINDEX, TROPONINI in the last 168 hours.  BNP (last 3 results) No results for input(s): BNP in the last 8760 hours.  ProBNP (last 3 results) No results for input(s): PROBNP in the last 8760 hours.  CBG: No results for input(s): GLUCAP in the last 168 hours.  Radiological Exams on Admission: MR BRAIN WO CONTRAST Result Date: 04/23/2024 EXAM: MRI BRAIN WITHOUT CONTRAST 04/23/2024 06:51:06 PM TECHNIQUE: Multiplanar multisequence MRI of the head/brain was performed without the administration of intravenous contrast. COMPARISON: MRI of the head dated 04/03/2024. CLINICAL HISTORY: Neuro deficit, acute, stroke suspected. FINDINGS: BRAIN AND VENTRICLES: Age-related atrophy. Mild-to-moderate cerebral white matter disease. There is no restricted diffusion to indicate acute or recent infarction. No intracranial hemorrhage. No mass. No midline shift. No hydrocephalus. The sella is unremarkable. Normal flow voids. ORBITS: No acute abnormality. SINUSES AND MASTOIDS: No acute abnormality. BONES AND SOFT TISSUES: Normal marrow signal. No acute soft tissue abnormality. IMPRESSION: 1. No acute infarction. 2. Age-related atrophy and mild-to-moderate cerebral white matter disease. Electronically signed by: Evalene Coho MD 04/23/2024 06:58 PM EST RP Workstation: [id number]   CT Head Wo Contrast Result Date:  04/23/2024 EXAM: CT HEAD WITHOUT CONTRAST 04/23/2024 06:21:00 PM TECHNIQUE: CT of the head was performed without the administration of intravenous contrast. Automated exposure control, iterative reconstruction, and/or weight based adjustment of the mA/kV was utilized to reduce the radiation dose to as low as reasonably achievable. COMPARISON: 04/02/2024 CLINICAL HISTORY: Delirium FINDINGS: BRAIN AND VENTRICLES: No acute hemorrhage. No evidence of acute infarct. Diffuse cerebral atrophy. Chronic small vessel disease throughout the deep white matter. Old left basal ganglia lacunar infarct, stable. No hydrocephalus. No extra-axial collection. No mass effect or midline shift. ORBITS: No acute abnormality. SINUSES: No acute abnormality. SOFT TISSUES AND SKULL: No acute soft tissue abnormality. No skull fracture. IMPRESSION: 1. No acute intracranial abnormality. 2. Diffuse cerebral atrophy and chronic small vessel disease throughout the deep white matter. 3. Stable old left basal ganglia lacunar infarct. Electronically signed by: Franky Crease MD 04/23/2024 06:26 PM EST RP Workstation: [id number]   DG Chest Port 1 View Result Date: 04/23/2024 EXAM: 1 VIEW(S) XRAY OF THE CHEST 04/23/2024 06:01:00 PM COMPARISON: None available. CLINICAL HISTORY: ams FINDINGS: LUNGS AND PLEURA: Small calcified granuloma in the right upper lung. No pulmonary edema. No pleural effusion. No pneumothorax. HEART AND MEDIASTINUM: Aortic atherosclerosis. No acute abnormality of the cardiac and mediastinal silhouettes. BONES AND SOFT TISSUES: No acute osseous abnormality. IMPRESSION: 1. No acute cardiopulmonary process. Electronically signed by: Luke Bun MD 04/23/2024 06:05 PM EST RP Workstation: [id number]    EKG: I independently viewed the EKG done and my findings are as followed: Sinus rhythm rate of 76.  QTc 442.  Assessment/Plan Present on Admission:  AMS (altered mental status)  Principal Problem:   AMS (altered mental  status)  Acute metabolic encephalopathy, improving Appears to be close to his baseline Alert and answering to questions appropriately. Possible polypharmacy Continue to treat underlying conditions  AKI on CKD 3B, suspect prerenal Baseline creatinine 1.5 with GFR 47 Presented with creatinine of 2.0 with GFR 35 Avoid nephrotoxic agents, dehydration, and hypotension. Continue IV fluid hydration Monitor urine output. Repeat BMP in the morning.  Chronic tremors worsening Continue to hold off gabapentin  as recommended by neurology Home Sinemet resumed Rest of management per neurology  BPH Resume home tamsulosin   Generalized weakness PT OT evaluation Fall precautions   Time: 75 minutes.   DVT prophylaxis: Subcu Lovenox daily.  Code Status: Full  code.  Family Communication: None at bedside.  Disposition Plan: Admitted to telemetry unit.  Consults called: Neurology.  Admission status: Observation status.   Status is: Observation    Terry LOISE Hurst MD [facility] Pager [phone]  If 7PM-7AM, please contact night-coverage [email or web address] Password [email or web address]  04/23/2024, 9:01 PM

## 2024-04-23 NOTE — Consult Note (Signed)
 NEUROLOGY CONSULT NOTE   Date of service: April 23, 2024 Patient Name: [name] MRN:  [id number] DOB:  06-Dec-1950 Chief Complaint: tremors, confusion Requesting Provider: Emil Share, DO  History of Present Illness  [name] is a 73 y.o. 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 Who was recently evaluated by our team for tremors which improved with sinemet and discharged to rehab. He was brought in by his family from rehab for worsening tremors.  Tremors first noted maybe about 2 years ago, they were rare and not really that bothersome. They worsened about 3 weeks ago while he was at [facility] but significantly improved with sinemet and reducing gabapentin  and opiod. He was discharged to rehab where he was doing better. Over the last week, would notice brief tremors/jerking that would wake him up at night. Also poor po intake at rehab and not drinking enough water.  I spoke with [name] to discuss medications that he has been getting. He has been getting sinemet TID, gabapentin  100mg  BID and Tramadol 50mg  TID. He has Norco ordered as PRN but has not been getting any since he has not requested any. His Creatinine is up today and concerning for mild AKI.  Wife reports that tremors have been progressively worsening for the last couple of days and unable to do rehab today. He also seemed a bit confused this AM and it did sem like his R eye looked odd and dawn in. No facial droop thou. He was more tired and seemed to have trouble focusing. She requested rehab that he be sent to the Hospital for evaluation. He had MRI Brain and CT head which have both been non revealing.   ROS  Comprehensive ROS performed and pertinent positives documented in HPI   Past History   Past Medical History:  Diagnosis Date   Allergy    Bone spur    Left Shoulder   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 (HCC)  07/18/2017   Fracture    right forearm   Gout    Hyperlipidemia    Hypertension    Other testicular hypofunction    PONV (postoperative nausea and vomiting)    Type II or unspecified type diabetes mellitus without mention of complication, not stated as uncontrolled     Past Surgical History:  Procedure Laterality Date   CATARACT EXTRACTION Left 08/2023   CYST REMOVAL HAND     ORIF RADIAL FRACTURE Right 07/18/2019   Procedure: OPEN REDUCTION INTERNAL FIXATION (ORIF) RADIUS AND ULNA FRACTURE;  Surgeon: Kendal Franky SQUIBB, MD;  Location: [facility] OR;  Service: Orthopedics;  Laterality: Right;   PROSTATE BIOPSY  2010   WRIST SURGERY Right 1998    Family History: Family History  Problem Relation Age of Onset   Heart disease Mother 29       stent   Heart disease Father    Kidney disease Father    Heart failure Father    Alzheimer's disease Paternal Grandfather    Colon cancer Neg Hx    Esophageal cancer Neg Hx    Liver cancer Neg Hx    Pancreatic cancer Neg Hx    Rectal cancer Neg Hx    Stomach cancer Neg Hx     Social History  reports that he has never smoked. He has never used smokeless tobacco. He reports that he does not currently use alcohol. He reports  that he does not use drugs.  Allergies  Allergen Reactions   Tape Other (See Comments)    SKIN IS VERY THIN. IT TEARS AND BRUISES VERY EASILY!!   Crestor [Rosuvastatin] Diarrhea   Welchol [Colesevelam Hcl] Diarrhea   Zoloft [Sertraline Hcl] Other (See Comments)    Reaction not recalled    Penicillins Hives, Nausea And Vomiting, Swelling and Other (See Comments)    Severe hives and possible swelling of the throat    Medications  No current facility-administered medications for this encounter.  Current Outpatient Medications:    allopurinol  (ZYLOPRIM ) 300 MG tablet, Take 300 mg by mouth daily as needed (for gout flares)., Disp: , Rfl:    aspirin EC 81 MG tablet, Take 81 mg by mouth 2 (two) times a week. Swallow whole.,  Disp: , Rfl:    carbidopa-levodopa (SINEMET IR) 25-100 MG tablet, Take 1 tablet by mouth 3 (three) times daily., Disp: , Rfl:    Cholecalciferol (VITAMIN D3 PO), Take 5,000 Units by mouth daily., Disp: , Rfl:    donepezil  (ARICEPT ) 5 MG tablet, Take 5 mg by mouth in the morning., Disp: , Rfl:    enalapril  (VASOTEC ) 10 MG tablet, Take 1 tablet (10 mg total) by mouth daily. (Patient taking differently: Take 5 mg by mouth daily as needed (for elevated blood pressure).), Disp: 90 tablet, Rfl: 1   ezetimibe  (ZETIA ) 10 MG tablet, TAKE 1 TABLET BY MOUTH DAILY FOR CHOLESTEROL (Patient not taking: Reported on 04/02/2024), Disp: 90 tablet, Rfl: 3   gabapentin  (NEURONTIN ) 100 MG capsule, Take 1 capsule (100 mg total) by mouth 2 (two) times daily., Disp: , Rfl:    HYDROcodone -acetaminophen  (NORCO/VICODIN) 5-325 MG tablet, Take 1-2 tablets by mouth every 4 (four) hours as needed., Disp: 5 tablet, Rfl: 0   lidocaine  (LIDODERM ) 5 %, Place 2 patches onto the skin daily. Remove & Discard patch within 12 hours or as directed by MD, Disp: 30 patch, Rfl: 0   Magnesium 250 MG TABS, Take 250 mg by mouth daily., Disp: , Rfl:    memantine  (NAMENDA  XR) 28 MG CP24 24 hr capsule, Take 1 capsule (28 mg total) by mouth daily., Disp: 90 capsule, Rfl: 4   methocarbamol (ROBAXIN) 500 MG tablet, Take 1,000 mg by mouth See admin instructions. Take 1,000 mg by mouth every five hours, Disp: , Rfl:    tamsulosin  (FLOMAX ) 0.4 MG CAPS capsule, Take 1 capsule (0.4 mg total) by mouth daily. Help with urination, Disp: 90 capsule, Rfl: 1   traMADol (ULTRAM) 50 MG tablet, Take 1 tablet (50 mg total) by mouth 3 (three) times daily., Disp: 4 tablet, Rfl: 0  Vitals   Vitals:   04/23/24 1445 04/23/24 1454  BP:  (!) 152/83  Pulse:  74  Resp:  16  Temp:  (!) 97.4 F (36.3 C)  SpO2: 98% 99%  Weight:  66.7 kg  Height:  5' 6 (1.676 m)    Body mass index is 23.73 kg/m.   Physical Exam   General: Laying comfortably in bed; in no acute  distress.  HENT: Normal oropharynx and mucosa. Normal external appearance of ears and nose.  Neck: Supple, no pain or tenderness  CV: No JVD. No peripheral edema.  Pulmonary: Symmetric Chest rise. Normal respiratory effort.  Abdomen: Soft to touch, non-tender.  Ext: No cyanosis, edema, or deformity  Skin: No rash. Normal palpation of skin.   Musculoskeletal: Normal digits and nails by inspection. No clubbing.   Neurologic Examination  Mental status/Cognition:  Alert, oriented to self, place, but thinks this is October and not November. Oriented to year, good attention.  Speech/language: non fluent, bradyphrenia, comprehension intact, object naming intact, repetition intact.  Cranial nerves:   CN II Pupils equal and reactive to light, no VF deficits    CN III,IV,VI EOM intact, no gaze preference or deviation, no nystagmus    CN V normal sensation in V1, V2, and V3 segments bilaterally    CN VII no asymmetry, no nasolabial fold flattening    CN VIII normal hearing to speech    CN IX & X normal palatal elevation, no uvular deviation    CN XI 5/5 head turn and 5/5 shoulder shrug bilaterally    CN XII midline tongue protrusion    Motor:  Muscle bulk: normal, tone cogwheeling noted, pronator drift none. Tremor: has coarse high amplitude and high frequency tremor.  Mvmt Root Nerve  Muscle Right Left Comments  SA C5/6 Ax Deltoid 5 5   EF C5/6 Mc Biceps 5 5   EE C6/7/8 Rad Triceps 5 5   WF C6/7 Med FCR     WE C7/8 PIN ECU     F Ab C8/T1 U ADM/FDI 5 5   HF L1/2/3 Fem Illopsoas 5 5   KE L2/3/4 Fem Quad 5 5   DF L4/5 D Peron Tib Ant 5 5   PF S1/2 Tibial Grc/Sol 5 5    Reflexes:  Right Left Comments  Pectoralis      Biceps (C5/6) 2 2   Brachioradialis (C5/6) 2 2    Triceps (C6/7) 2 2    Patellar (L3/4) 2 2    Achilles (S1) 2 2    Hoffman      Plantar     Jaw jerk    Sensation:  Light touch Intact throughout   Pin prick    Temperature    Vibration   Proprioception     Coordination/Complex Motor:  - Finger to Nose with significant tremor but no ataxia. - Heel to shin unable to do due to tremors. - Rapid alternating movement are slowed in BL uppers. - Gait: deferred for patient safety.   Labs/Imaging/Neurodiagnostic studies   CBC:  Recent Labs  Lab 2024-04-25 1534  WBC 9.1  HGB 16.5  HCT 50.2  MCV 84.5  PLT 261   Basic Metabolic Panel:  Lab Results  Component Value Date   NA 138 2024/04/25   K 3.7 04/25/24   CO2 26 04/25/2024   GLUCOSE 140 (H) 04/25/2024   BUN 23 04/25/2024   CREATININE 2.00 (H) 04-25-24   CALCIUM  9.4 25-Apr-2024   GFRNONAA 35 (L) 2024-04-25   GFRAA 49 (L) 12/02/2020   Lipid Panel:  Lab Results  Component Value Date   LDLCALC 94 12/06/2023   HgbA1c:  Lab Results  Component Value Date   HGBA1C 6.3 (H) 12/06/2023   Urine Drug Screen: No results found for: LABOPIA, COCAINSCRNUR, LABBENZ, AMPHETMU, THCU, LABBARB  Alcohol Level No results found for: [facility] INR  Lab Results  Component Value Date   INR 1.01 07/31/2009   APTT  Lab Results  Component Value Date   APTT 29 07/31/2009   AED levels: No results found for: PHENYTOIN, ZONISAMIDE, LAMOTRIGINE, LEVETIRACETA  CT Head without contrast(Personally reviewed): CTH was negative for a large hypodensity concerning for a large territory infarct or hyperdensity concerning for an ICH  MRI Brain(Personally reviewed): No acute stroke ASSESSMENT   [name] is a 73 y.o. 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 Who was recently evaluated by our team for tremors which improved with sinemet and discharged to rehab. He was brought in by his family from rehab for worsening tremors and found to have AKI.  I suspect that his high amplitude and high frequency tremors are likely secondary to medications including gabapentin  and Tramadol. No  hyper-reflexia or other features of serotonin syndrome. Suspect reduced clearance in the setting of AKI probably contributing to the tremor. Thou the tremor is not classic for parkinsonism, he does have signs of parkinsonism including decreased facial expressions, cogwheel rigidity and bradykinesia.  Will hold gabapentin  tonight and use Tramadol PRN for back pain. I had extensive discussion with patient and wife about judicious use of pain medications.  RECOMMENDATIONS  - hold gabapentin  tonight - continue Sinemet 25/100 TID - use tramadol sparingly and as PRN rather than scheduled. - workup and treatment of AKI per primary team. - neurology will continue to follow along. ______________________________________________________________________  Plan discussed with patient, his wife at the bedside. Plan also discussed with Dr. Emil in person and with Dr. Shona over secure chat.  I personally spent a total of 75 minutes in the care of the patient today including preparing to see the patient, getting/reviewing separately obtained history, performing a medically appropriate exam/evaluation, counseling and educating, placing orders, referring and communicating with other health care professionals, documenting clinical information in the EHR, independently interpreting results, communicating results, and coordinating care.   Signed, Marolyn Urschel, MD [facility]

## 2024-04-23 NOTE — ED Notes (Signed)
 Witnessed RN inform CCMD of transfer from 23 to 11.

## 2024-04-24 DIAGNOSIS — Z7982 Long term (current) use of aspirin: Secondary | ICD-10-CM | POA: Diagnosis not present

## 2024-04-24 DIAGNOSIS — T40425A Adverse effect of tramadol, initial encounter: Secondary | ICD-10-CM | POA: Diagnosis not present

## 2024-04-24 DIAGNOSIS — Z8419 Family history of other disorders of kidney and ureter: Secondary | ICD-10-CM | POA: Diagnosis not present

## 2024-04-24 DIAGNOSIS — Z91048 Other nonmedicinal substance allergy status: Secondary | ICD-10-CM | POA: Diagnosis not present

## 2024-04-24 DIAGNOSIS — E1122 Type 2 diabetes mellitus with diabetic chronic kidney disease: Secondary | ICD-10-CM | POA: Diagnosis present

## 2024-04-24 DIAGNOSIS — R29810 Facial weakness: Secondary | ICD-10-CM | POA: Diagnosis present

## 2024-04-24 DIAGNOSIS — R251 Tremor, unspecified: Secondary | ICD-10-CM | POA: Diagnosis present

## 2024-04-24 DIAGNOSIS — N1832 Chronic kidney disease, stage 3b: Secondary | ICD-10-CM | POA: Diagnosis present

## 2024-04-24 DIAGNOSIS — E785 Hyperlipidemia, unspecified: Secondary | ICD-10-CM | POA: Diagnosis present

## 2024-04-24 DIAGNOSIS — Z820 Family history of epilepsy and other diseases of the nervous system: Secondary | ICD-10-CM | POA: Diagnosis not present

## 2024-04-24 DIAGNOSIS — K5900 Constipation, unspecified: Secondary | ICD-10-CM | POA: Diagnosis present

## 2024-04-24 DIAGNOSIS — M109 Gout, unspecified: Secondary | ICD-10-CM | POA: Diagnosis present

## 2024-04-24 DIAGNOSIS — T426X5A Adverse effect of other antiepileptic and sedative-hypnotic drugs, initial encounter: Secondary | ICD-10-CM | POA: Diagnosis not present

## 2024-04-24 DIAGNOSIS — I129 Hypertensive chronic kidney disease with stage 1 through stage 4 chronic kidney disease, or unspecified chronic kidney disease: Secondary | ICD-10-CM | POA: Diagnosis present

## 2024-04-24 DIAGNOSIS — E1142 Type 2 diabetes mellitus with diabetic polyneuropathy: Secondary | ICD-10-CM | POA: Diagnosis present

## 2024-04-24 DIAGNOSIS — N179 Acute kidney failure, unspecified: Secondary | ICD-10-CM | POA: Diagnosis not present

## 2024-04-24 DIAGNOSIS — Z9842 Cataract extraction status, left eye: Secondary | ICD-10-CM | POA: Diagnosis not present

## 2024-04-24 DIAGNOSIS — Z79899 Other long term (current) drug therapy: Secondary | ICD-10-CM | POA: Diagnosis not present

## 2024-04-24 DIAGNOSIS — G928 Other toxic encephalopathy: Secondary | ICD-10-CM | POA: Diagnosis present

## 2024-04-24 DIAGNOSIS — Z888 Allergy status to other drugs, medicaments and biological substances status: Secondary | ICD-10-CM | POA: Diagnosis not present

## 2024-04-24 DIAGNOSIS — Z880 Allergy status to penicillin: Secondary | ICD-10-CM | POA: Diagnosis not present

## 2024-04-24 DIAGNOSIS — T50995A Adverse effect of other drugs, medicaments and biological substances, initial encounter: Secondary | ICD-10-CM | POA: Diagnosis present

## 2024-04-24 DIAGNOSIS — E860 Dehydration: Secondary | ICD-10-CM | POA: Diagnosis present

## 2024-04-24 DIAGNOSIS — R4182 Altered mental status, unspecified: Secondary | ICD-10-CM | POA: Diagnosis not present

## 2024-04-24 DIAGNOSIS — Z8249 Family history of ischemic heart disease and other diseases of the circulatory system: Secondary | ICD-10-CM | POA: Diagnosis not present

## 2024-04-24 DIAGNOSIS — N400 Enlarged prostate without lower urinary tract symptoms: Secondary | ICD-10-CM | POA: Diagnosis present

## 2024-04-24 LAB — CBC
HCT: 48.4 % (ref 39.0–52.0)
Hemoglobin: 16.5 g/dL (ref 13.0–17.0)
MCH: 27.9 pg (ref 26.0–34.0)
MCHC: 34.1 g/dL (ref 30.0–36.0)
MCV: 81.8 fL (ref 80.0–100.0)
Platelets: 276 K/uL (ref 150–400)
RBC: 5.92 MIL/uL — ABNORMAL HIGH (ref 4.22–5.81)
RDW: 12.8 % (ref 11.5–15.5)
WBC: 10.8 K/uL — ABNORMAL HIGH (ref 4.0–10.5)
nRBC: 0 % (ref 0.0–0.2)

## 2024-04-24 LAB — BASIC METABOLIC PANEL WITH GFR
Anion gap: 19 — ABNORMAL HIGH (ref 5–15)
BUN: 22 mg/dL (ref 8–23)
CO2: 16 mmol/L — ABNORMAL LOW (ref 22–32)
Calcium: 9.3 mg/dL (ref 8.9–10.3)
Chloride: 104 mmol/L (ref 98–111)
Creatinine, Ser: 1.85 mg/dL — ABNORMAL HIGH (ref 0.61–1.24)
GFR, Estimated: 38 mL/min — ABNORMAL LOW (ref >60)
Glucose, Bld: 134 mg/dL — ABNORMAL HIGH (ref 70–99)
Potassium: 3.6 mmol/L (ref 3.5–5.1)
Sodium: 139 mmol/L (ref 135–145)

## 2024-04-24 LAB — CBG MONITORING, ED
Glucose-Capillary: 131 mg/dL — ABNORMAL HIGH (ref 70–99)
Glucose-Capillary: 173 mg/dL — ABNORMAL HIGH (ref 70–99)

## 2024-04-24 LAB — MAGNESIUM: Magnesium: 1.9 mg/dL (ref 1.7–2.4)

## 2024-04-24 LAB — PHOSPHORUS: Phosphorus: <1 mg/dL — CL (ref 2.5–4.6)

## 2024-04-24 LAB — GLUCOSE, CAPILLARY: Glucose-Capillary: 139 mg/dL — ABNORMAL HIGH (ref 70–99)

## 2024-04-24 MED ORDER — ALLOPURINOL 300 MG PO TABS: 300.0000 mg | ORAL_TABLET | Freq: Every day | ORAL | Status: DC | PRN

## 2024-04-24 MED ORDER — HYDRALAZINE HCL 20 MG/ML IJ SOLN: 10.0000 mg | Freq: Four times a day (QID) | INTRAMUSCULAR | Status: DC | PRN

## 2024-04-24 MED ORDER — POTASSIUM PHOSPHATES 15 MMOLE/5ML IV SOLN: 30.0000 mmol | Freq: Once | INTRAVENOUS | Status: DC

## 2024-04-24 MED ORDER — ASPIRIN 81 MG PO TBEC
81.0000 mg | DELAYED_RELEASE_TABLET | ORAL | Status: DC
  Administered 2024-04-25 – 2024-05-02 (×3): 81 mg via ORAL
  Filled 2024-04-24 (×4): qty 1

## 2024-04-24 MED ORDER — MEMANTINE HCL ER 28 MG PO CP24
28.0000 mg | ORAL_CAPSULE | Freq: Every day | ORAL | Status: DC
  Administered 2024-04-24 – 2024-05-03 (×10): 28 mg via ORAL
  Filled 2024-04-24 (×10): qty 1

## 2024-04-24 MED ORDER — POTASSIUM PHOSPHATES 15 MMOLE/5ML IV SOLN
30.0000 mmol | Freq: Two times a day (BID) | INTRAVENOUS | Status: AC
  Administered 2024-04-24 (×2): 30 mmol via INTRAVENOUS
  Filled 2024-04-24 (×2): qty 10

## 2024-04-24 MED ORDER — TAMSULOSIN HCL 0.4 MG PO CAPS
0.4000 mg | ORAL_CAPSULE | Freq: Every day | ORAL | Status: DC
  Administered 2024-04-24 – 2024-05-03 (×10): 0.4 mg via ORAL
  Filled 2024-04-24 (×11): qty 1

## 2024-04-24 MED ORDER — EZETIMIBE 10 MG PO TABS
10.0000 mg | ORAL_TABLET | Freq: Every day | ORAL | Status: DC
  Administered 2024-04-24 – 2024-05-02 (×9): 10 mg via ORAL
  Filled 2024-04-24 (×9): qty 1

## 2024-04-24 MED ORDER — POLYETHYLENE GLYCOL 3350 17 G PO PACK
17.0000 g | PACK | Freq: Every day | ORAL | Status: DC
  Administered 2024-04-24 – 2024-05-02 (×9): 17 g via ORAL
  Filled 2024-04-24 (×9): qty 1

## 2024-04-24 MED ORDER — VITAMIN D 25 MCG (1000 UNIT) PO TABS
5000.0000 [IU] | ORAL_TABLET | Freq: Every day | ORAL | Status: DC
  Administered 2024-04-24 – 2024-05-03 (×10): 5000 [IU] via ORAL
  Filled 2024-04-24 (×10): qty 5

## 2024-04-24 MED ORDER — DONEPEZIL HCL 5 MG PO TABS
5.0000 mg | ORAL_TABLET | Freq: Every morning | ORAL | Status: DC
  Administered 2024-04-24 – 2024-05-03 (×10): 5 mg via ORAL
  Filled 2024-04-24 (×10): qty 1

## 2024-04-24 MED ORDER — BISACODYL 10 MG RE SUPP
10.0000 mg | Freq: Once | RECTAL | Status: AC
  Administered 2024-04-24: 10 mg via RECTAL
  Filled 2024-04-24: qty 1

## 2024-04-24 NOTE — Evaluation (Signed)
 Physical Therapy Evaluation Patient Details Name: [name] MRN: [id number] DOB: 1951/06/12 Today's Date: 04/24/2024  History of Present Illness  [name] is a 73 y.o. male presented from [facility] to ED with worsening tremors, increased confusion, right eye looking different. MRI and CT -negative for acute event. 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 Pt presents with an increase in whole body tremors, generalized weakness, and impaired balance/gait pattern. Required ModAx2 to TotalAx2 for bed mobility. Once seated on EOB, pt required B LE's blocked due to an increase in tremors with pt sliding anteriorly. Unable to stand fully upright due to tremors with TotalAx2 needed to perform squat pivot. Pt also has a fear of falling with reassurance and active listening provided during session. Recommending return to <3hrs post acute rehab with acute PT to follow.         If plan is discharge home, recommend the following: A lot of help with walking and/or transfers;A lot of help with bathing/dressing/bathroom;Assist for transportation;Help with stairs or ramp for entrance;Direct supervision/assist for medications management;Direct supervision/assist for financial management;Assistance with cooking/housework   Can travel by private vehicle   No    Equipment Recommendations Rolling walker (2 wheels);BSC/3in1     Functional Status Assessment Patient has had a recent decline in their functional status and demonstrates the ability to make significant improvements in function in a reasonable and predictable amount of time.     Precautions / Restrictions Precautions Precautions: Fall;Back Precaution Booklet Issued: No (verbally reviewed) Recall of Precautions/Restrictions:  Impaired Required Braces or Orthoses: Other Brace;Spinal Brace Spinal Brace: Thoracolumbosacral orthotic;Applied in sitting position Other Brace: no currently here in person (per wife, it's in the car) Restrictions Weight Bearing Restrictions Per Provider Order: No      Mobility  Bed Mobility Overal bed mobility: Needs Assistance Bed Mobility: Rolling, Sidelying to Sit, Sit to Supine Rolling: Min assist Sidelying to sit: Mod assist, +2 for safety/equipment   Sit to supine: Total assist, +2 for physical assistance   General bed mobility comments: cues for log roll technique with MinA to roll. ModAx2 to manage LE's and support trunk. B LE blocked upon sitting upright due to increase in whole body tremors. TotalAx2 for return to supine as pt started to slide anteriorly    Transfers Overall transfer level: Needs assistance Equipment used: 2 person hand held assist Transfers: Bed to chair/wheelchair/BSC     Squat pivot transfers: Total assist, +2 physical assistance    General transfer comment: unable to complete full sit<>stand due to whole body tremors. TotalAx2 to perform squat pivot with B LE's blocked    Ambulation/Gait    General Gait Details: unable this date     Balance Overall balance assessment: Needs assistance Sitting-balance support: Bilateral upper extremity supported, Feet supported Sitting balance-Leahy Scale: Poor Sitting balance - Comments: due to tremors   Standing balance support: Bilateral upper extremity supported Standing balance-Leahy Scale: Zero Standing balance comment: unable to use RW at this time due to tremors       Pertinent Vitals/Pain Pain Assessment Pain Assessment: Faces Faces Pain Scale: Hurts a little bit Pain Location: back but intermittently Pain Descriptors / Indicators: Discomfort Pain Intervention(s): Limited activity within patient's tolerance, Monitored during session, Repositioned    Home Living Family/patient expects to  be discharged to:: Private residence Living Arrangements: Spouse/significant other Available Help at Discharge: Family;Available PRN/intermittently Type of Home: House Home Access: Stairs to enter Entrance Stairs-Rails: Left Entrance Stairs-Number of Steps: 4   Home Layout: One level Home Equipment: Shower seat - built in;Grab bars - tub/shower;Cane - single point;Rollator (4 wheels);BSC/3in1 Additional Comments: counter next to toilet    Prior Function Prior Level of Function : Needs assist;History of Falls (last six months);Driving    Mobility Comments: using RW with therapy, a few falls 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 Unable to formally MMT due to tremors RLE Coordination: decreased gross motor LLE Deficits / Details: At least 3/5, tremors worse with intention. Unable to formally MMT due to tremors LLE Coordination: decreased gross motor       Communication   Communication Communication: Impaired Factors Affecting Communication: Hearing impaired    Cognition Arousal: Alert Behavior During Therapy: Anxious   PT - Cognitive impairments: Problem solving, Safety/Judgement, Sequencing, Initiation, Orientation, History of cognitive impairments   Orientation impairments: Situation    PT - Cognition Comments: Wife reports hx of dementia  and memory loss at baseline. Anxious about falling with increased time to follow commands and to re-assure pt Following commands: Impaired Following commands impaired: Follows one step commands inconsistently     Cueing Cueing Techniques: Verbal cues      PT Assessment Patient needs continued PT services  PT Problem List Decreased strength;Decreased coordination;Decreased range of motion;Decreased cognition;Decreased activity tolerance;Decreased balance;Decreased mobility;Decreased knowledge of precautions;Decreased safety awareness;Decreased knowledge of use of DME       PT Treatment Interventions Therapeutic exercise;DME instruction;Gait training;Stair training;Functional mobility training;Therapeutic activities;Patient/family education;Neuromuscular re-education;Balance training    PT Goals (Current goals can be found in the Care Plan section)  Acute Rehab PT Goals Patient Stated Goal: to go back to rehab PT Goal Formulation: With patient/family Time For Goal Achievement: 05/08/24 Potential to Achieve Goals: Good    Frequency Min 2X/week     Co-evaluation PT/OT/SLP Co-Evaluation/Treatment: Yes Reason for Co-Treatment: For patient/therapist safety;To address functional/ADL transfers PT goals addressed during session: Mobility/safety with mobility;Balance;Proper use of DME OT goals addressed during session: ADL's and self-care;Proper use of Adaptive equipment and DME       AM-PAC PT 6 Clicks Mobility  Outcome Measure Help needed turning from your back to your side while in a flat bed without using bedrails?: A Little Help needed moving from lying on your back to sitting on the side of a flat bed without using bedrails?: Total Help needed moving to and from a bed to a chair (including a wheelchair)?: Total Help needed standing up from a chair using your arms (e.g., wheelchair or bedside chair)?: Total Help needed to walk in hospital room?: Total Help needed climbing 3-5  steps with a railing? : Total 6 Click Score: 8    End of Session Equipment Utilized During Treatment: Gait belt Activity Tolerance: Patient tolerated treatment well Patient left: in bed;with  call bell/phone within reach;with family/visitor present Nurse Communication: Mobility status PT Visit Diagnosis: Other abnormalities of gait and mobility (R26.89);Muscle weakness (generalized) (M62.81);Other symptoms and signs involving the nervous system (R29.898);Unsteadiness on feet (R26.81)    Time: 8956-8884 PT Time Calculation (min) (ACUTE ONLY): 32 min   Charges:   PT Evaluation $PT Eval Moderate Complexity: 1 Mod   PT General Charges $$ ACUTE PT VISIT: 1 Visit       Kate ORN, PT, DPT Secure Chat Preferred  Rehab Office [phone]   Kate BRAVO Wendolyn 04/24/2024, 1:51 PM

## 2024-04-24 NOTE — Evaluation (Signed)
 Occupational Therapy Evaluation Patient Details Name: [name] MRN: [id number] DOB: [birth year] Today's Date: 04/24/2024   History of Present Illness   [name] is a 73 y.o. male presented from [facility] to ED with worsening tremors, increased confusion, right eye looking different. MRI and CT -negative for acute event. 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 Presently he is max-total A for all basic ADLs and total A +2 for transfers due to his whole body tremors when he starts to move. He will continue to benefit from acute OT with follow up from continued inpatient follow up therapy, <3 hours/day.      If plan is discharge home, recommend the following:   Two people to help with walking and/or transfers;Assistance with cooking/housework;Assistance with feeding;Direct supervision/assist for financial management;Direct supervision/assist for medications management;Assist for transportation;Help with stairs or ramp for entrance;Supervision due to cognitive status;Two people to help with bathing/dressing/bathroom     Functional Status Assessment   Patient has had a recent decline in their functional status and demonstrates the ability to make significant improvements in function in a reasonable and predictable amount of time.     Equipment Recommendations   Other (comment) (TBD next venue)      Precautions/Restrictions   Precautions Precautions: Fall;Back Precaution Booklet Issued:  (verbally) Recall of Precautions/Restrictions: Impaired Required Braces or Orthoses: Other Brace;Spinal Brace Spinal Brace: Thoracolumbosacral orthotic;Applied in sitting  position Other Brace: no currently here in person (per wife, it's in the car) Restrictions Weight Bearing Restrictions Per Provider Order: No     Mobility Bed Mobility Overal bed mobility: Needs Assistance Bed Mobility: Rolling, Sidelying to Sit, Sit to Supine Rolling: Min assist (Mod VCs)) Sidelying to sit: Mod assist, +2 for safety/equipment   Sit to supine: Total assist, +2 for physical assistance        Transfers Overall transfer level: Needs assistance Equipment used: 2 person hand held assist Transfers: Bed to chair/wheelchair/BSC, Sit to/from Stand Sit to Stand:  (pt unable to fully stand with +2 A due to shaking/tremors)   Squat pivot transfers: Total assist, +2 physical assistance              Balance Overall balance assessment: Needs assistance Sitting-balance support: Bilateral upper extremity supported, Feet supported Sitting balance-Leahy Scale: Poor Sitting balance - Comments: due to tremors   Standing balance support: Bilateral upper extremity supported Standing balance-Leahy Scale: Zero Standing balance comment: unable to use RW at this time due to tremors                           ADL either performed or assessed with clinical judgement   ADL Overall ADL's : Needs assistance/impaired                                       General ADL Comments: max-total A +2 for all basic ADLs due to tremors (worse in open chain movements/ unsupported positions--ie: sitting EOB)     Vision Patient Visual Report: No change from baseline  Pertinent Vitals/Pain Pain Assessment Pain Assessment: Faces Faces Pain Scale: Hurts a little bit Pain Location: back but intermittently Pain Descriptors / Indicators: Discomfort Pain Intervention(s): Limited activity within patient's tolerance, Monitored during session, Repositioned     Extremity/Trunk Assessment Upper Extremity Assessment Upper Extremity Assessment: Generalized  weakness RUE Deficits / Details: + tremors (increase with intention) RUE Coordination: decreased fine motor;decreased gross motor LUE Deficits / Details: + tremors (increase with intention) LUE Coordination: decreased fine motor;decreased gross motor           Communication Communication Communication: Impaired Factors Affecting Communication: Hearing impaired   Cognition Arousal: Alert Behavior During Therapy: Anxious Cognition: Cognition impaired   Orientation impairments: Situation Awareness: Intellectual awareness intact, Online awareness intact   Attention impairment (select first level of impairment): Sustained attention Executive functioning impairment (select all impairments): Initiation, Organization, Sequencing, Reasoning, Problem solving                   Following commands: Impaired Following commands impaired: Follows one step commands inconsistently     Cueing   Cueing Techniques: Verbal cues              Home Living Family/patient expects to be discharged to:: Private residence Living Arrangements: Spouse/significant other Available Help at Discharge: Family;Available PRN/intermittently Type of Home: House Home Access: Stairs to enter [employer] of Steps: 4 Entrance Stairs-Rails: Left Home Layout: One level     Bathroom Shower/Tub: Producer, Television/film/video: Standard     Home Equipment: Shower seat - built in;Grab bars - tub/shower;Cane - single point;Rollator (4 wheels);BSC/3in1   Additional Comments: counter next to toilet      Prior Functioning/Environment Prior Level of Function : Needs assist;History of Falls (last six months);Driving             Mobility Comments: using RW with therapy, a few falls 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 (prior to last admission); per pt he could intermittently put his socks on    OT Problem List: Impaired balance (sitting  and/or standing);Decreased coordination;Decreased cognition;Decreased safety awareness;Impaired UE functional use;Pain   OT Treatment/Interventions: Self-care/ADL training;Therapeutic exercise;Neuromuscular education;DME and/or AE instruction;Therapeutic activities;Cognitive remediation/compensation;Patient/family education;Balance training      OT Goals(Current goals can be found in the care plan section)   Acute Rehab OT Goals Patient Stated Goal: for the tremors to go away OT Goal Formulation: With patient Time For Goal Achievement: 05/08/24 Potential to Achieve Goals:  (depends on control of tremors)   OT Frequency:  Min 2X/week    Co-evaluation PT/OT/SLP Co-Evaluation/Treatment: Yes Reason for Co-Treatment: For patient/therapist safety;To address functional/ADL transfers PT goals addressed during session: Mobility/safety with mobility;Balance;Proper use of DME OT goals addressed during session: ADL's and self-care;Proper use of Adaptive equipment and DME      AM-PAC OT 6 Clicks Daily Activity     Outcome Measure Help from another person eating meals?: Total Help from another person taking care of personal grooming?: Total Help from another person toileting, which includes using toliet, bedpan, or urinal?: Total Help from another person bathing (including washing, rinsing, drying)?: Total Help from another person to put on and taking off regular upper body clothing?: Total Help from another person to put on and taking off regular lower body clothing?: Total 6 Click Score: 6   End of Session Equipment Utilized During Treatment: Gait belt Nurse Communication: Mobility status (use bed pan --not up on BSC)  Activity Tolerance:  (limited  by tremors) Patient left: in bed (stretcher)  OT Visit Diagnosis: Unsteadiness on feet (R26.81);Other abnormalities of gait and mobility (R26.89);Repeated falls (R29.6);History of falling (Z91.81);Other symptoms and signs involving the  nervous system (R29.898);Pain Pain - part of body:  (back)                Time: 8973-8882 OT Time Calculation (min): 51 min Charges:  OT General Charges $OT Visit: 1 Visit OT Evaluation $OT Eval Moderate Complexity: 1 Mod OT Treatments $Self Care/Home Management : 8-22 mins  Donny BECKER OT Acute Rehabilitation Services Office [phone]    [place] Dorothyann Distel 04/24/2024, 1:35 PM

## 2024-04-24 NOTE — Progress Notes (Signed)
 PROGRESS NOTE    [name]  FMW:[id number] DOB: [birth year] DOA: 04/23/2024 PCP: Wayne Bene, MD   Brief Narrative:  HPI: [name] is a 73 y.o. male with medical history significant for type 2 diabetes, peripheral neuropathy, hyperlipidemia, hypertension, chronic tremors, mild cognitive impairment, parkinsonian features, who presents to the ER due to worsening tremors for the past 2 days.  Associated with some confusion.  His wife was concerned about possible right facial droop.   In the ER, the patient was seen by neurology/stroke team.  CT head without contrast was nonacute.  MRI brain was negative for acute CVA.  EDP requesting admission for management of tremors.  Admitted by [name], hospitalist service.   ED Course: Temperature 98.  148/66, pulse 70, respiratory rate 16, O2 saturation 100% on room air.  Assessment & Plan:   Principal Problem:   AMS (altered mental status)  Acute metabolic encephalopathy, POA: CT head followed by MRI brain unremarkable.  Patient is fully alert and oriented and back to baseline now.   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  Continue IV fluids, avoid nephrotoxic agent.  Hypertension: Blood pressure labile.  On enalapril  PTA which is on hold due to AKI.  Will place on as needed hydralazine.   Chronic tremors, worsening: Tremors first noted maybe about 2 years ago, they were rare and not really that bothersome. They worsened about 3 weeks ago while he was at [facility] but significantly improved with sinemet and reducing gabapentin  and opiod. He was discharged to rehab where he was doing better. Over the last week, would notice brief tremors/jerking that would wake him up at night. Also poor po intake at rehab and not drinking enough water.  Seen by neurology, holding gabapentin  per their recommendations and continuing current dose of  Sinemet.  Severe hypophosphatemia: Being replenished.   BPH Resume home tamsulosin    Generalized weakness PT OT evaluation Fall precautions  Constipation: Has not had any BM in last 2 days.  Complains of abdominal pain which likely is due to constipation.  No abdominal tenderness.  Dulcolax suppository ordered.  DVT prophylaxis: enoxaparin (LOVENOX) injection 30 mg Start: 04/23/24 2115   Code Status: Full Code  Family Communication:  None present at bedside.  Plan of care discussed with patient in length and he/she verbalized understanding and agreed with it.  Status is: Observation The patient will require care spanning > 2 midnights and should be moved to inpatient because: Patient is still with significant tremors and severe hypophosphatemia.   Estimated body mass index is 23.73 kg/m as calculated from the following:   Height as of this encounter: 5' 6 (1.676 m).   Weight as of this encounter: 66.7 kg.    Nutritional Assessment: Body mass index is 23.73 kg/m.[name] Seen by dietician.  I agree with the assessment and plan as outlined below: Nutrition Status:        . Skin Assessment: I have examined the patient's skin and I agree with the wound assessment as performed by the wound care RN as outlined below:    Consultants:  Neurology following  Procedures:  None  Antimicrobials:  Anti-infectives (From admission, onward)    None         Subjective: Patient seen and examined in the ED, no family present at bedside.  Patient fully alert and oriented.  He had significant bilateral upper extremity tremors  and tachypnea.  However he denied any shortness of breath, chest pain or any other complaint.  His major complaint was mild abdominal pain and constipation.  Objective: Vitals:   04/24/24 0615 04/24/24 0630 04/24/24 0727 04/24/24 0745  BP:   (!) 150/83 (!) 144/79  Pulse: (!) 59 61 72 77  Resp: 13 14 14  (!) 26  Temp:   97.6 F (36.4 C)   TempSrc:   Oral    SpO2: 100% 100% 100% 100%  Weight:      Height:        Intake/Output Summary (Last 24 hours) at 04/24/2024 0832 Last data filed at 04/24/2024 0725 Gross per 24 hour  Intake 1604.94 ml  Output --  Net 1604.94 ml   Filed Weights   04/23/24 1454  Weight: 66.7 kg    Examination:  General exam: Appears anxious, tachypneic.  With significant bilateral upper extremity tremors more on the right arm than the left arm. Respiratory system: Clear to auscultation. Respiratory effort normal.  Tachypneic. Cardiovascular system: S1 & S2 heard, RRR. No JVD, murmurs, rubs, gallops or clicks. No pedal edema. Gastrointestinal system: Abdomen is nondistended, soft and nontender. No organomegaly or masses felt. Normal bowel sounds heard. Central nervous system: Alert and oriented. No focal neurological deficits. Extremities: Symmetric 5 x 5 power. Skin: No rashes, lesions or ulcers Psychiatry: Judgement and insight appear normal. Mood & affect appropriate.    Data Reviewed: I have personally reviewed following labs and imaging studies  CBC: Recent Labs  Lab 04/23/24 1534 04/24/24 0057  WBC 9.1 10.8*  HGB 16.5 16.5  HCT 50.2 48.4  MCV 84.5 81.8  PLT 261 276   Basic Metabolic Panel: Recent Labs  Lab 04/23/24 1534 04/24/24 0057  NA 138 139  K 3.7 3.6  CL 102 104  CO2 26 16*  GLUCOSE 140* 134*  BUN 23 22  CREATININE 2.00* 1.85*  CALCIUM  9.4 9.3  MG  --  1.9  PHOS  --  <1.0*   GFR: Estimated Creatinine Clearance: 32.1 mL/min (A) (by C-G formula based on SCr of 1.85 mg/dL (H)). Liver Function Tests: Recent Labs  Lab 04/23/24 1534  AST 21  ALT 6  ALKPHOS 115  BILITOT 0.8  PROT 6.8  ALBUMIN 4.1   No results for input(s): LIPASE, AMYLASE in the last 168 hours. No results for input(s): AMMONIA in the last 168 hours. Coagulation Profile: No results for input(s): INR, PROTIME in the last 168 hours. Cardiac Enzymes: No results for input(s): CKTOTAL, CKMB,  CKMBINDEX, TROPONINI in the last 168 hours. BNP (last 3 results) No results for input(s): PROBNP in the last 8760 hours. HbA1C: No results for input(s): HGBA1C in the last 72 hours. CBG: Recent Labs  Lab 04/24/24 0100  GLUCAP 131*   Lipid Profile: No results for input(s): CHOL, HDL, LDLCALC, TRIG, CHOLHDL, LDLDIRECT in the last 72 hours. Thyroid  Function Tests: No results for input(s): TSH, T4TOTAL, FREET4, T3FREE, THYROIDAB in the last 72 hours. Anemia Panel: No results for input(s): VITAMINB12, FOLATE, FERRITIN, TIBC, IRON, RETICCTPCT in the last 72 hours. Sepsis Labs: No results for input(s): PROCALCITON, LATICACIDVEN in the last 168 hours.  No results found for this or any previous visit (from the past 240 hours).   Radiology Studies: MR BRAIN WO CONTRAST Result Date: 04/23/2024 EXAM: MRI BRAIN WITHOUT CONTRAST 04/23/2024 06:51:06 PM TECHNIQUE: Multiplanar multisequence MRI of the head/brain was performed without the administration of intravenous contrast. COMPARISON: MRI of the head dated 04/03/2024. CLINICAL HISTORY: Neuro  deficit, acute, stroke suspected. FINDINGS: BRAIN AND VENTRICLES: Age-related atrophy. Mild-to-moderate cerebral white matter disease. There is no restricted diffusion to indicate acute or recent infarction. No intracranial hemorrhage. No mass. No midline shift. No hydrocephalus. The sella is unremarkable. Normal flow voids. ORBITS: No acute abnormality. SINUSES AND MASTOIDS: No acute abnormality. BONES AND SOFT TISSUES: Normal marrow signal. No acute soft tissue abnormality. IMPRESSION: 1. No acute infarction. 2. Age-related atrophy and mild-to-moderate cerebral white matter disease. Electronically signed by: Evalene Coho MD 04/23/2024 06:58 PM EST RP Workstation: [id number]   CT Head Wo Contrast Result Date: 04/23/2024 EXAM: CT HEAD WITHOUT CONTRAST 04/23/2024 06:21:00 PM TECHNIQUE: CT of the head was performed  without the administration of intravenous contrast. Automated exposure control, iterative reconstruction, and/or weight based adjustment of the mA/kV was utilized to reduce the radiation dose to as low as reasonably achievable. COMPARISON: 04/02/2024 CLINICAL HISTORY: Delirium FINDINGS: BRAIN AND VENTRICLES: No acute hemorrhage. No evidence of acute infarct. Diffuse cerebral atrophy. Chronic small vessel disease throughout the deep white matter. Old left basal ganglia lacunar infarct, stable. No hydrocephalus. No extra-axial collection. No mass effect or midline shift. ORBITS: No acute abnormality. SINUSES: No acute abnormality. SOFT TISSUES AND SKULL: No acute soft tissue abnormality. No skull fracture. IMPRESSION: 1. No acute intracranial abnormality. 2. Diffuse cerebral atrophy and chronic small vessel disease throughout the deep white matter. 3. Stable old left basal ganglia lacunar infarct. Electronically signed by: Franky Crease MD 04/23/2024 06:26 PM EST RP Workstation: [id number]   DG Chest Port 1 View Result Date: 04/23/2024 EXAM: 1 VIEW(S) XRAY OF THE CHEST 04/23/2024 06:01:00 PM COMPARISON: None available. CLINICAL HISTORY: ams FINDINGS: LUNGS AND PLEURA: Small calcified granuloma in the right upper lung. No pulmonary edema. No pleural effusion. No pneumothorax. HEART AND MEDIASTINUM: Aortic atherosclerosis. No acute abnormality of the cardiac and mediastinal silhouettes. BONES AND SOFT TISSUES: No acute osseous abnormality. IMPRESSION: 1. No acute cardiopulmonary process. Electronically signed by: Luke Bun MD 04/23/2024 06:05 PM EST RP Workstation: [id number]    Scheduled Meds:  bisacodyl  10 mg Rectal Once   carbidopa-levodopa  1 tablet Oral TID   enoxaparin (LOVENOX) injection  30 mg Subcutaneous Q24H   tamsulosin   0.4 mg Oral Daily   Continuous Infusions:  lactated ringers  Stopped (04/24/24 0725)   potassium PHOSPHATE IVPB (in mmol) 30 mmol (04/24/24 0726)     LOS: 0 days   Fredia Skeeter, MD [facility]  04/24/2024, 8:32 AM   *Please note that this is a verbal dictation therefore any spelling or grammatical errors are due to the Dragon Medical One system interpretation.  Please page via Amion and do not message via secure chat for urgent patient care matters. Secure chat can be used for non urgent patient care matters.  How to contact the [facility] Attending or Consulting provider 7A - 7P or covering provider during after hours 7P -7A, for this patient?  Check the care team in [facility] and look for a) attending/consulting [facility] provider listed and b) the [facility] team listed. Page or secure chat 7A-7P. Log into [email or web address] and use [facility] universal password to access. If you do not have the password, please contact the hospital operator. Locate the [facility] provider you are looking for under [facility] and page to a number that you can be directly reached. If you still have difficulty reaching the provider, please page the [facility] (Director on Call) for the Hospitalists listed on amion for assistance.

## 2024-04-24 NOTE — ED Notes (Signed)
 Checked patient cbg it was 38 notified RN of blood sugar patient is resting with call bel in reach

## 2024-04-24 NOTE — ED Notes (Signed)
Pt placed on bedpan by NT.  

## 2024-04-24 NOTE — Telephone Encounter (Addendum)
 Patient's wife left voicemail on 04/23/24 at 10:20 am: he has be released from [place] and in [place] at rehabilitation. Need neurology appointment. Please call me back at [phone].  Forwarded message to New Patient Referrals

## 2024-04-25 DIAGNOSIS — N179 Acute kidney failure, unspecified: Secondary | ICD-10-CM | POA: Diagnosis not present

## 2024-04-25 DIAGNOSIS — T426X5A Adverse effect of other antiepileptic and sedative-hypnotic drugs, initial encounter: Secondary | ICD-10-CM | POA: Diagnosis not present

## 2024-04-25 DIAGNOSIS — E1122 Type 2 diabetes mellitus with diabetic chronic kidney disease: Secondary | ICD-10-CM | POA: Diagnosis not present

## 2024-04-25 DIAGNOSIS — T40425A Adverse effect of tramadol, initial encounter: Secondary | ICD-10-CM | POA: Diagnosis not present

## 2024-04-25 DIAGNOSIS — R4182 Altered mental status, unspecified: Secondary | ICD-10-CM | POA: Diagnosis not present

## 2024-04-25 LAB — COMPREHENSIVE METABOLIC PANEL WITH GFR
ALT: 6 U/L (ref 0–44)
AST: 20 U/L (ref 15–41)
Albumin: 3.7 g/dL (ref 3.5–5.0)
Alkaline Phosphatase: 105 U/L (ref 38–126)
Anion gap: 12 (ref 5–15)
BUN: 14 mg/dL (ref 8–23)
CO2: 22 mmol/L (ref 22–32)
Calcium: 8.9 mg/dL (ref 8.9–10.3)
Chloride: 104 mmol/L (ref 98–111)
Creatinine, Ser: 1.73 mg/dL — ABNORMAL HIGH (ref 0.61–1.24)
GFR, Estimated: 41 mL/min — ABNORMAL LOW (ref >60)
Glucose, Bld: 138 mg/dL — ABNORMAL HIGH (ref 70–99)
Potassium: 4.7 mmol/L (ref 3.5–5.1)
Sodium: 138 mmol/L (ref 135–145)
Total Bilirubin: 0.9 mg/dL (ref 0.0–1.2)
Total Protein: 6.2 g/dL — ABNORMAL LOW (ref 6.5–8.1)

## 2024-04-25 LAB — CBC WITH DIFFERENTIAL/PLATELET
Abs Immature Granulocytes: 0.04 K/uL (ref 0.00–0.07)
Basophils Absolute: 0.1 K/uL (ref 0.0–0.1)
Basophils Relative: 1 %
Eosinophils Absolute: 0.4 K/uL (ref 0.0–0.5)
Eosinophils Relative: 5 %
HCT: 44.7 % (ref 39.0–52.0)
Hemoglobin: 15.1 g/dL (ref 13.0–17.0)
Immature Granulocytes: 1 %
Lymphocytes Relative: 22 %
Lymphs Abs: 1.6 K/uL (ref 0.7–4.0)
MCH: 27.9 pg (ref 26.0–34.0)
MCHC: 33.8 g/dL (ref 30.0–36.0)
MCV: 82.5 fL (ref 80.0–100.0)
Monocytes Absolute: 0.7 K/uL (ref 0.1–1.0)
Monocytes Relative: 9 %
Neutro Abs: 4.6 K/uL (ref 1.7–7.7)
Neutrophils Relative %: 62 %
Platelets: 248 K/uL (ref 150–400)
RBC: 5.42 MIL/uL (ref 4.22–5.81)
RDW: 13.2 % (ref 11.5–15.5)
WBC: 7.3 K/uL (ref 4.0–10.5)
nRBC: 0 % (ref 0.0–0.2)

## 2024-04-25 LAB — PHOSPHORUS: Phosphorus: 6.8 mg/dL — ABNORMAL HIGH (ref 2.5–4.6)

## 2024-04-25 LAB — GLUCOSE, CAPILLARY
Glucose-Capillary: 121 mg/dL — ABNORMAL HIGH (ref 70–99)
Glucose-Capillary: 137 mg/dL — ABNORMAL HIGH (ref 70–99)

## 2024-04-25 MED ORDER — CARVEDILOL 3.125 MG PO TABS
3.1250 mg | ORAL_TABLET | Freq: Two times a day (BID) | ORAL | Status: DC
  Administered 2024-04-25 – 2024-05-03 (×17): 3.125 mg via ORAL
  Filled 2024-04-25 (×17): qty 1

## 2024-04-25 MED ORDER — ENOXAPARIN SODIUM 40 MG/0.4ML IJ SOSY
40.0000 mg | PREFILLED_SYRINGE | INTRAMUSCULAR | Status: DC
  Administered 2024-04-25 – 2024-05-02 (×8): 40 mg via SUBCUTANEOUS
  Filled 2024-04-25 (×8): qty 0.4

## 2024-04-25 MED ORDER — HYDRALAZINE HCL 20 MG/ML IJ SOLN: 10.0000 mg | Freq: Four times a day (QID) | INTRAMUSCULAR | Status: DC | PRN

## 2024-04-25 NOTE — Plan of Care (Signed)

## 2024-04-25 NOTE — Progress Notes (Signed)
 PROGRESS NOTE                                                                                                                                                                                                             Patient Demographics:    [name], is a 73 y.o. male, DOB - December 16, 1950, FMW:[id number]  Outpatient Primary MD for the patient is Wayne Bene, MD    LOS - 1  Admit date - 04/23/2024    Chief Complaint  Patient presents with   Altered Mental Status       Brief Narrative (HPI from H&P)    73 y.o. male with medical history significant for type 2 diabetes, peripheral neuropathy, hyperlipidemia, hypertension, chronic tremors, mild cognitive impairment, parkinsonian features, who presents to the ER due to worsening tremors for the past 2 days.  Associated with some confusion.  His wife was concerned about possible right facial droop.   In the ER, the patient was seen by neurology/stroke team.  CT head without contrast was nonacute.  MRI brain was negative for acute CVA.  EDP requesting admission for management of tremors.  Admitted by [facility], hospitalist service.   Subjective:    [name] today has, No headache, No chest pain, No abdominal pain - No Nausea, No new weakness tingling or numbness, no shortness of breath, generalized weakness.   Assessment  & Plan :    Acute metabolic encephalopathy, POA:  CT head followed by MRI brain unremarkable.  Patient is fully alert and oriented and back to baseline now.  Minimize narcotics and muscle relaxants, continue supportive care overall much better.  Likely back to rehab in a day or 2 if stable.  AKI on CKD 3B, suspect prerenal - Baseline creatinine appears to be between 1.5-1.8 with GFR 47, Presented with creatinine of 2.0 which has improved to 1.85.  Continue IV fluids, avoid nephrotoxic agent.  Creatinine improved   Hypertension: Blood pressure slightly on the  high side will place on low-dose Coreg and monitor continue to hold ACE and ARB due to recent AKI and dehydration, as needed hydralazine.   Chronic tremors, worsening: Tremors first noted maybe about 2 years ago, they were rare and not really that bothersome. They worsened about 3 weeks ago while he was at [facility] but significantly improved with sinemet and reducing gabapentin   and opiod. He was discharged to rehab where he was doing better. Over the last week, would notice brief tremors/jerking that would wake him up at night. Also poor po intake at rehab and not drinking enough water.  Seen by neurology, holding gabapentin  per their recommendations and continuing current dose of Sinemet.  Neurology postdischarge will follow-up with Dr. Evonnie, patient's personal neurologist has left the practice according to the wife.   Severe hypophosphatemia: Replaced and stable   BPH  Resume home tamsulosin    Constipation: On bowel regimen and improving  Generalized weakness PT OT evaluation Fall precautions, likely discharge back to rehab in a day or 2.           Condition - Extremely Guarded  Family Communication  : Updated wife in detail on 04/25/2024  Code Status : Full code  Consults  : Neurology  PUD Prophylaxis :    Procedures  :     MRI - 1. No acute infarction. 2. Age-related atrophy and mild-to-moderate cerebral white matter disease.      Disposition Plan  :    Status is: Inpatient  DVT Prophylaxis  :    enoxaparin (LOVENOX) injection 30 mg Start: 04/23/24 2115    Lab Results  Component Value Date   PLT 248 04/25/2024    Diet :  Diet Order             Diet heart healthy/carb modified Room service appropriate? Yes; Fluid consistency: Thin  Diet effective now                    Inpatient Medications  Scheduled Meds:  aspirin EC  81 mg Oral Once per day on Monday Thursday   carbidopa-levodopa  1 tablet Oral TID   carvedilol  3.125 mg Oral BID WC   cholecalciferol   5,000 Units Oral Daily   donepezil   5 mg Oral q AM   enoxaparin (LOVENOX) injection  30 mg Subcutaneous Q24H   ezetimibe   10 mg Oral QHS   memantine   28 mg Oral Daily   polyethylene glycol  17 g Oral Daily   tamsulosin   0.4 mg Oral Daily   Continuous Infusions: PRN Meds:.acetaminophen , allopurinol , hydrALAZINE, melatonin, prochlorperazine, traMADol  Antibiotics  :    Anti-infectives (From admission, onward)    None         Objective:   Vitals:   04/24/24 2000 04/24/24 2346 04/25/24 0400 04/25/24 0738  BP: (!) 147/79 136/77 (!) 146/77 (!) 142/84  Pulse:  90 75 76  Resp:  18 20 16   Temp:  98 F (36.7 C) 97.9 F (36.6 C) 97.7 F (36.5 C)  TempSrc:  Oral Oral Oral  SpO2:  96% 96% 97%  Weight:      Height:        Wt Readings from Last 3 Encounters:  04/23/24 66.7 kg  04/10/24 71.6 kg  03/18/24 72.6 kg     Intake/Output Summary (Last 24 hours) at 04/25/2024 0816 Last data filed at 04/25/2024 0648 Gross per 24 hour  Intake 1073.39 ml  Output 1100 ml  Net -26.61 ml     Physical Exam  Awake Alert, No new F.N deficits, Normal affect [name].AT,PERRAL Supple Neck, No JVD,   Symmetrical Chest wall movement, Good air movement bilaterally, CTAB RRR,No Gallops,Rubs or new Murmurs,  +ve B.Sounds, Abd Soft, No tenderness,   No Cyanosis, Clubbing or edema      Data Review:    Recent Labs  Lab 04/23/24  1534 04/24/24 0057 04/25/24 0302  WBC 9.1 10.8* 7.3  HGB 16.5 16.5 15.1  HCT 50.2 48.4 44.7  PLT 261 276 248  MCV 84.5 81.8 82.5  MCH 27.8 27.9 27.9  MCHC 32.9 34.1 33.8  RDW 13.0 12.8 13.2  LYMPHSABS  --   --  1.6  MONOABS  --   --  0.7  EOSABS  --   --  0.4  BASOSABS  --   --  0.1    Recent Labs  Lab 04/23/24 1534 04/24/24 0057 04/25/24 0302  NA 138 139 138  K 3.7 3.6 4.7  CL 102 104 104  CO2 26 16* 22  ANIONGAP 10 19* 12  GLUCOSE 140* 134* 138*  BUN 23 22 14   CREATININE 2.00* 1.85* 1.73*  AST 21  --  20  ALT 6  --  6  ALKPHOS 115  --  105   BILITOT 0.8  --  0.9  ALBUMIN 4.1  --  3.7  MG  --  1.9  --   PHOS  --  <1.0* 6.8*  CALCIUM  9.4 9.3 8.9      Recent Labs  Lab 04/23/24 1534 04/24/24 0057 04/25/24 0302  MG  --  1.9  --   CALCIUM  9.4 9.3 8.9    --------------------------------------------------------------------------------------------------------------- Lab Results  Component Value Date   CHOL 161 12/06/2023   HDL 45 12/06/2023   LDLCALC 94 12/06/2023   TRIG 127 12/06/2023   CHOLHDL 3.6 12/06/2023    Lab Results  Component Value Date   HGBA1C 6.3 (H) 12/06/2023   No results for input(s): TSH, T4TOTAL, FREET4, T3FREE, THYROIDAB in the last 72 hours. No results for input(s): VITAMINB12, FOLATE, FERRITIN, TIBC, IRON, RETICCTPCT in the last 72 hours. ------------------------------------------------------------------------------------------------------------------ Cardiac Enzymes No results for input(s): CKMB, TROPONINI, MYOGLOBIN in the last 168 hours.  Invalid input(s): CK  Micro Results No results found for this or any previous visit (from the past 240 hours).  Radiology Report  MR BRAIN WO CONTRAST Result Date: 04/23/2024 EXAM: MRI BRAIN WITHOUT CONTRAST 04/23/2024 06:51:06 PM TECHNIQUE: Multiplanar multisequence MRI of the head/brain was performed without the administration of intravenous contrast. COMPARISON: MRI of the head dated 04/03/2024. CLINICAL HISTORY: Neuro deficit, acute, stroke suspected. FINDINGS: BRAIN AND VENTRICLES: Age-related atrophy. Mild-to-moderate cerebral white matter disease. There is no restricted diffusion to indicate acute or recent infarction. No intracranial hemorrhage. No mass. No midline shift. No hydrocephalus. The sella is unremarkable. Normal flow voids. ORBITS: No acute abnormality. SINUSES AND MASTOIDS: No acute abnormality. BONES AND SOFT TISSUES: Normal marrow signal. No acute soft tissue abnormality. IMPRESSION: 1. No acute infarction.  2. Age-related atrophy and mild-to-moderate cerebral white matter disease. Electronically signed by: Evalene Coho MD 04/23/2024 06:58 PM EST RP Workstation: [id number]   CT Head Wo Contrast Result Date: 04/23/2024 EXAM: CT HEAD WITHOUT CONTRAST 04/23/2024 06:21:00 PM TECHNIQUE: CT of the head was performed without the administration of intravenous contrast. Automated exposure control, iterative reconstruction, and/or weight based adjustment of the mA/kV was utilized to reduce the radiation dose to as low as reasonably achievable. COMPARISON: 04/02/2024 CLINICAL HISTORY: Delirium FINDINGS: BRAIN AND VENTRICLES: No acute hemorrhage. No evidence of acute infarct. Diffuse cerebral atrophy. Chronic small vessel disease throughout the deep white matter. Old left basal ganglia lacunar infarct, stable. No hydrocephalus. No extra-axial collection. No mass effect or midline shift. ORBITS: No acute abnormality. SINUSES: No acute abnormality. SOFT TISSUES AND SKULL: No acute soft tissue abnormality. No skull fracture. IMPRESSION: 1. No acute  intracranial abnormality. 2. Diffuse cerebral atrophy and chronic small vessel disease throughout the deep white matter. 3. Stable old left basal ganglia lacunar infarct. Electronically signed by: Franky Crease MD 04/23/2024 06:26 PM EST RP Workstation: [id number]   DG Chest Port 1 View Result Date: 04/23/2024 EXAM: 1 VIEW(S) XRAY OF THE CHEST 04/23/2024 06:01:00 PM COMPARISON: None available. CLINICAL HISTORY: ams FINDINGS: LUNGS AND PLEURA: Small calcified granuloma in the right upper lung. No pulmonary edema. No pleural effusion. No pneumothorax. HEART AND MEDIASTINUM: Aortic atherosclerosis. No acute abnormality of the cardiac and mediastinal silhouettes. BONES AND SOFT TISSUES: No acute osseous abnormality. IMPRESSION: 1. No acute cardiopulmonary process. Electronically signed by: Luke Bun MD 04/23/2024 06:05 PM EST RP Workstation: [id number]     Signature  -   Lavada Stank M.D on 04/25/2024 at 8:16 AM   -  To page go to [email or web address]

## 2024-04-25 NOTE — TOC Initial Note (Signed)
 Transition of Care [facility]) - Initial/Assessment Note    Patient Details  Name: [name] MRN: [id number] Date of Birth: [birth year]  [facility]) CM/SW Contact:    Sharyne Drum, Student-Social Work Phone Number: 04/25/2024, 1:36 PM  Clinical Narrative:                  Patient arrived to ED from [facility] with confusion and aphasia. CSW received consult for SNF placement at discharge. MSW Student spoke with spouse as patient is disoriented. MSW Student received consent to start insurance auth back to [employer] and informed spouse of PTAR transport when medically ready for d/c. Inpatient care team continuing to follow for needs.   Expected Discharge Plan: Skilled Nursing Facility Barriers to Discharge: Continued Medical Work up   Patient Goals and CMS Choice Patient states their goals for this hospitalization and ongoing recovery are:: getting stronger at snf          Expected Discharge Plan and Services In-house Referral: Clinical Social Work     Living arrangements for the past 2 months: Single Family Home                                      Prior Living Arrangements/Services Living arrangements for the past 2 months: Single Family Home Lives with:: Spouse Patient language and need for interpreter reviewed:: Yes Do you feel safe going back to the place where you live?: Yes      Need for Family Participation in Patient Care: Yes (Comment) Care giver support system in place?: Yes (comment)   Criminal Activity/Legal Involvement Pertinent to Current Situation/Hospitalization: No - Comment as needed  Activities of Daily Living   ADL Screening (condition at time of admission) Independently performs ADLs?: Yes (appropriate for developmental age) Is the patient deaf or have difficulty hearing?: No Does the patient have difficulty seeing, even when wearing glasses/contacts?: Yes Does the patient have difficulty concentrating, remembering, or  making decisions?: Yes  Permission Sought/Granted Permission sought to share information with : Family Supports                Emotional Assessment Appearance:: Appears stated age Attitude/Demeanor/Rapport: Unable to Assess Affect (typically observed): Unable to Assess Orientation: : Oriented to Self, Oriented to Place Alcohol / Substance Use: Not Applicable Psych Involvement: No (comment)  Admission diagnosis:  Tremor [R25.1] AMS (altered mental status) [R41.82] Patient Active Problem List   Diagnosis Date Noted   AMS (altered mental status) 04/23/2024   Fall at home, initial encounter 04/05/2024   Closed compression fracture of body of L1 vertebra (HCC) 04/04/2024   Elevated troponin 04/04/2024   Aphasia 09/28/2023   Memory loss 09/28/2023   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   Neuropathy 09/28/2023   Tremors of nervous system 09/28/2023   Chronic bilateral low back pain with right-sided sciatica 03/15/2023   Gait abnormality 03/15/2023   Mild neurocognitive disorder 11/04/2022   Parkinsonian features 09/06/2022   RLS (restless legs syndrome) 09/06/2022   MCI (mild cognitive impairment) 09/06/2022   Senile purpura 01/08/2020   Statin myopathy 12/19/2019   Diabetes, polyneuropathy (HCC) 12/17/2018   Statin intolerance 12/17/2018   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 (HCC) 07/18/2017   Vitamin D  deficiency 08/29/2013   Medication management 08/29/2013   Hyperlipidemia associated with type 2 diabetes mellitus (HCC)    Hypertension    Type 2 diabetes mellitus with  stage 3 chronic kidney disease (HCC)    Testosterone  deficiency    Gout    PCP:  Aletha Bene, MD Pharmacy:   [facility] [id number] - [address], [place] - [address] [facility] [address] [place] [address] Phone: [phone] Fax: [phone]  [facility] - [place], [place] - [address] [place] [address] [place] [address] [place] [address] Phone: [phone] Fax:  [phone]     Social Drivers of Health (SDOH) Social History: SDOH Screenings   Food Insecurity: No Food Insecurity (04/24/2024)  Housing: Low Risk  (04/24/2024)  Transportation Needs: Unmet Transportation Needs (04/24/2024)  Utilities: Not At Risk (04/24/2024)  Depression (PHQ2-9): Low Risk  (04/12/2023)  Financial Resource Strain: Low Risk  (09/28/2023)   Received from [facility]  Social Connections: Moderately Integrated (04/24/2024)  Tobacco Use: Low Risk  (04/23/2024)   SDOH Interventions: Transportation Interventions: Intervention Not Indicated, Inpatient TOC   Readmission Risk Interventions    04/10/2024   12:44 PM  Readmission Risk Prevention Plan  Post Dischage Appt Complete  Medication Screening Complete  Transportation Screening Complete

## 2024-04-25 NOTE — Plan of Care (Signed)
  Problem: Health Behavior/Discharge Planning: Goal: Ability to manage health-related needs will improve Outcome: Progressing   Problem: Clinical Measurements: Goal: Will remain free from infection Outcome: Progressing   Problem: Activity: Goal: Risk for activity intolerance will decrease Outcome: Progressing   Problem: Pain Managment: Goal: General experience of comfort will improve and/or be controlled Outcome: Progressing   Problem: Safety: Goal: Ability to remain free from injury will improve Outcome: Progressing   Problem: Skin Integrity: Goal: Risk for impaired skin integrity will decrease Outcome: Progressing

## 2024-04-25 NOTE — Progress Notes (Signed)
 NEUROLOGY CONSULT FOLLOW UP NOTE   Date of service: April 25, 2024 Patient Name: [name] MRN:  [id number] DOB:  02/06/1951  Interval Hx/subjective   Kidney function is improving, no family at the bedside. He is unsure if his tremors are better or worse at this time. Family noted that his tremors worsen with movement   Spoke with wife over the phone today. Gabapentin  started for painful neuropathy (burning) initially. When it was stopped he became shaky so it was restarted outpatient. Since stopping it again he has not reported neuropathy symptoms. Tramadol was started for pain after his fall. Primidone did not work and was discontinued and Sinemet was started. Since Sinemet was started, he has been night and day better. Seems more coherent and responsive since starting Sinemet as well as improvement in the tremors per wife. She also attributes this to stopping the tramadol. Planning to follow up with Dr. Evonnie outpatient.   Vitals   Vitals:   04/24/24 2000 04/24/24 2346 04/25/24 0400 04/25/24 0738  BP: (!) 147/79 136/77 (!) 146/77 (!) 142/84  Pulse:  90 75 76  Resp:  18 20 16   Temp:  98 F (36.7 C) 97.9 F (36.6 C) 97.7 F (36.5 C)  TempSrc:  Oral Oral Oral  SpO2:  96% 96% 97%  Weight:      Height:         Body mass index is 23.73 kg/m.  Physical Exam   Constitutional: Appears well-developed and well-nourished.  Psych: Affect appropriate to situation.  Eyes: No scleral injection.  HENT: No OP obstrucion.  Head: Normocephalic.  Cardiovascular: Normal rate and regular rhythm.  Respiratory: Effort normal, non-labored breathing.  GI: Soft.  No distension. There is no tenderness.  Skin: WDI.   Neurologic Examination   Mental Status: Patient is awake, alert, oriented to person, place; initially states October but corrects himself.  No aphasia or dysarthria  Comprehension intact, but difficulty remembering medications  Cranial Nerves: II: Visual Fields are full.  Pupils are equal, round, and reactive to light.   III,IV, VI: EOMI without ptosis or diplopia.  V: Facial sensation is symmetric to temperature VII: Facial movement is symmetric resting and smiling VIII: Hearing is intact to voice X: Palate elevates symmetrically XI: Shoulder shrug is symmetric. XII: Tongue protrudes midline without atrophy Motor: Tone is normal. Bulk is normal. Able to maintain antigravity strength in all extremities. Tremor worsens with movement  Sensory: Sensation is symmetric to light touch and temperature in the arms and legs. No extinction to DSS present.  Cerebellar: FNF with tremor worse on the right than the left Tremor noted in bilateral lower extremities but able to tap big toe to finger accurately    Medications  Current Facility-Administered Medications:    acetaminophen  (TYLENOL ) tablet 500 mg, 500 mg, Oral, Q6H PRN, Shona, Carole N, DO   allopurinol  (ZYLOPRIM ) tablet 300 mg, 300 mg, Oral, Daily PRN, Vernon Ranks, MD   aspirin EC tablet 81 mg, 81 mg, Oral, Once per day on Monday Thursday, Pahwani, Ranks, MD   carbidopa-levodopa (SINEMET IR) 25-100 MG per tablet immediate release 1 tablet, 1 tablet, Oral, TID, Khaliqdina, Salman, MD, 1 tablet at 04/24/24 2129   cholecalciferol (VITAMIN D3) 25 MCG (1000 UNIT) tablet 5,000 Units, 5,000 Units, Oral, Daily, Pahwani, Ranks, MD, 5,000 Units at 04/24/24 0959   donepezil  (ARICEPT ) tablet 5 mg, 5 mg, Oral, q AM, Pahwani, Ravi, MD, 5 mg at 04/25/24 0631   enoxaparin (LOVENOX) injection 30 mg, 30 mg,  Subcutaneous, Q24H, Shona Laurence N, DO, 30 mg at 04/24/24 2129   ezetimibe  (ZETIA ) tablet 10 mg, 10 mg, Oral, QHS, Pahwani, Ravi, MD, 10 mg at 04/24/24 2129   hydrALAZINE (APRESOLINE) injection 10 mg, 10 mg, Intravenous, Q6H PRN, Vernon Ranks, MD   melatonin tablet 5 mg, 5 mg, Oral, QHS PRN, Shona Laurence N, DO, 5 mg at 04/24/24 0212   memantine  (NAMENDA  XR) 24 hr capsule 28 mg, 28 mg, Oral, Daily, Pahwani, Ravi, MD, 28 mg  at 04/24/24 1025   polyethylene glycol (MIRALAX / GLYCOLAX) packet 17 g, 17 g, Oral, Daily, Pahwani, Ravi, MD, 17 g at 04/24/24 0959   prochlorperazine (COMPAZINE) injection 5 mg, 5 mg, Intravenous, Q6H PRN, Shona, Carole N, DO, 5 mg at 04/24/24 0003   tamsulosin  (FLOMAX ) capsule 0.4 mg, 0.4 mg, Oral, Daily, Hall, Carole N, DO, 0.4 mg at 04/24/24 0212   traMADol (ULTRAM) tablet 50 mg, 50 mg, Oral, TID PRN, Shona Laurence SAILOR, DO  Labs and Diagnostic Imaging   CBC:  Recent Labs  Lab 04/24/24 0057 04/25/24 0302  WBC 10.8* 7.3  NEUTROABS  --  4.6  HGB 16.5 15.1  HCT 48.4 44.7  MCV 81.8 82.5  PLT 276 248    Basic Metabolic Panel:  Lab Results  Component Value Date   NA 138 04/25/2024   K 4.7 04/25/2024   CO2 22 04/25/2024   GLUCOSE 138 (H) 04/25/2024   BUN 14 04/25/2024   CREATININE 1.73 (H) 04/25/2024   CALCIUM  8.9 04/25/2024   GFRNONAA 41 (L) 04/25/2024   GFRAA 49 (L) 12/02/2020   Lipid Panel:  Lab Results  Component Value Date   LDLCALC 94 12/06/2023   HgbA1c:  Lab Results  Component Value Date   HGBA1C 6.3 (H) 12/06/2023   Urine Drug Screen: No results found for: LABOPIA, COCAINSCRNUR, LABBENZ, AMPHETMU, THCU, LABBARB  Alcohol Level No results found for: [facility] INR  Lab Results  Component Value Date   INR 1.01 07/31/2009   APTT  Lab Results  Component Value Date   APTT 29 07/31/2009   CT Head without contrast (Personally reviewed): CTH was negative for any large hypodensity concerning for a large territory infarct or hyperdensity concerning for an ICH   MRI Brain (Personally reviewed): No acute stroke  Assessment   [name] is a 73 y.o. male with a past medical history of HTN, HLD, DM2 complicated by polyneuropathy, MCI on aricept  and namenda , CKD, chronic tremors on gabapentin  and now sinemet. Was recently evaluated by our team for tremors which improved with sinemet and discharged to rehab. He was brought in by his family from rehab for  worsening tremors and found to have AKI. We suspect that his high amplitude and high frequency tremors are likely secondary to medications including gabapentin  and possibly tramadol. No hyper-reflexia or other features of serotonin syndrome. Suspect reduced clearance in the setting of AKI probably contributing to the tremor. Though the tremor is not classic for Parkinsonism, he does have signs of Parkinsonism including decreased facial expressions, cogwheel rigidity and bradykinesia.   Will hold gabapentin  and use Tramadol PRN for back pain. Spoke with wife at length over the phone regarding medication history. Sinemet has seemed to help the most in regards to responsiveness/clarity and tremor. She has also not heard him complain of any neuropathy symptoms since coming off of the gabapentin  and is okay with continuing to stay off of it.   Recommendations  - Discontinue gabapentin . Do not represcribe at discharge.  -  Gabapentin  level is pending ([facility] sendout) - Continue Sinemet 25/100 TID - Use tramadol sparingly and as PRN rather than scheduled. - Workup and treatment of AKI per primary team. - Follow up outpatient with Dr. Evonnie - Neurohospitalist service will sign off. Please call if there are additional questions.  ______________________________________________________________________   Bonney Jorene Last, NP [facility]  Electronically signed: Dr. Aamori Mcmasters

## 2024-04-25 NOTE — [place] FL2 (Signed)
 [place]  MEDICAID FL2 LEVEL OF CARE FORM     IDENTIFICATION  Patient Name: [name] Birthdate: 08-20-1950 Sex: male Admission Date (Current Location): 04/23/2024  [facility] and [place] Number:  Producer, Television/film/video and Address:  [facility]. [facility], [address], [place], [place] [address]      Provider Number: [id number]  Attending Physician Name and Address:  Dennise Lavada POUR, MD  Relative Name and Phone Number:       Current Level of Care: Hospital Recommended Level of Care: Skilled Nursing Facility Prior Approval Number:    Date Approved/Denied:   PASRR Number: [id number]  Discharge Plan: SNF    Current Diagnoses: Patient Active Problem List   Diagnosis Date Noted   AMS (altered mental status) 04/23/2024   Fall at home, initial encounter 04/05/2024   Closed compression fracture of body of L1 vertebra (HCC) 04/04/2024   Elevated troponin 04/04/2024   Aphasia 09/28/2023   Memory loss 09/28/2023   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   Neuropathy 09/28/2023   Tremors of nervous system 09/28/2023   Chronic bilateral low back pain with right-sided sciatica 03/15/2023   Gait abnormality 03/15/2023   Mild neurocognitive disorder 11/04/2022   Parkinsonian features 09/06/2022   RLS (restless legs syndrome) 09/06/2022   MCI (mild cognitive impairment) 09/06/2022   Senile purpura 01/08/2020   Statin myopathy 12/19/2019   Diabetes, polyneuropathy (HCC) 12/17/2018   Statin intolerance 12/17/2018   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 (HCC) 07/18/2017   Vitamin D  deficiency 08/29/2013   Medication management 08/29/2013   Hyperlipidemia associated with type 2 diabetes mellitus (HCC)    Hypertension    Type 2 diabetes mellitus with stage 3 chronic kidney disease (HCC)    Testosterone  deficiency    Gout     Orientation RESPIRATION BLADDER Height & Weight     Self, Place  Normal Incontinent Weight: 147 lb (66.7 kg) Height:  5' 6 (167.6 cm)   BEHAVIORAL SYMPTOMS/MOOD NEUROLOGICAL BOWEL NUTRITION STATUS      Continent Diet (See dc summary)  AMBULATORY STATUS COMMUNICATION OF NEEDS Skin   Extensive Assist Verbally Other (Comment)                       Personal Care Assistance Level of Assistance  Bathing, Feeding, Dressing Bathing Assistance: Maximum assistance Feeding assistance: Limited assistance Dressing Assistance: Maximum assistance     Functional Limitations Info  Sight Sight Info: Impaired Hearing Info: Adequate Speech Info: Adequate    SPECIAL CARE FACTORS FREQUENCY  PT (By licensed PT), OT (By licensed OT)     PT Frequency: 5x/week OT Frequency: 5x/week            Contractures Contractures Info: Not present    Additional Factors Info  Code Status, Allergies Code Status Info: Full Allergies Info: Tape, Crestor (Rosuvastatin), Penicillins, Welchol (Colesevelam Hcl), Zoloft (Sertraline Hcl)           Current Medications (04/25/2024):  This is the current hospital active medication list Current Facility-Administered Medications  Medication Dose Route Frequency Provider Last Rate Last Admin   acetaminophen  (TYLENOL ) tablet 500 mg  500 mg Oral Q6H PRN Hall, Carole N, DO       allopurinol  (ZYLOPRIM ) tablet 300 mg  300 mg Oral Daily PRN Pahwani, Ravi, MD       aspirin EC tablet 81 mg  81 mg Oral Once per day on Monday Thursday Vernon Ranks, MD   81 mg at  04/25/24 1009   carbidopa-levodopa (SINEMET IR) 25-100 MG per tablet immediate release 1 tablet  1 tablet Oral TID Khaliqdina, Salman, MD   1 tablet at 04/25/24 1009   carvedilol (COREG) tablet 3.125 mg  3.125 mg Oral BID WC Singh, Prashant K, MD   3.125 mg at 04/25/24 1009   cholecalciferol (VITAMIN D3) 25 MCG (1000 UNIT) tablet 5,000 Units  5,000 Units Oral Daily Pahwani, Ravi, MD   5,000 Units at 04/25/24 1009   donepezil  (ARICEPT ) tablet 5 mg  5 mg Oral q AM Pahwani, Ravi, MD   5 mg at 04/25/24 0631   enoxaparin (LOVENOX) injection 30 mg  30  mg Subcutaneous Q24H Hall, Carole N, DO   30 mg at 04/24/24 2129   ezetimibe  (ZETIA ) tablet 10 mg  10 mg Oral QHS Pahwani, Ravi, MD   10 mg at 04/24/24 2129   hydrALAZINE (APRESOLINE) injection 10 mg  10 mg Intravenous Q6H PRN Singh, Prashant K, MD       melatonin tablet 5 mg  5 mg Oral QHS PRN Hall, Carole N, DO   5 mg at 04/24/24 0212   memantine  (NAMENDA  XR) 24 hr capsule 28 mg  28 mg Oral Daily Pahwani, Ravi, MD   28 mg at 04/25/24 1009   polyethylene glycol (MIRALAX / GLYCOLAX) packet 17 g  17 g Oral Daily Pahwani, Ravi, MD   17 g at 04/25/24 1009   prochlorperazine (COMPAZINE) injection 5 mg  5 mg Intravenous Q6H PRN Hall, Carole N, DO   5 mg at 04/24/24 0003   tamsulosin  (FLOMAX ) capsule 0.4 mg  0.4 mg Oral Daily Shona Laurence N, DO   0.4 mg at 04/25/24 1009   traMADol (ULTRAM) tablet 50 mg  50 mg Oral TID PRN Hall, Carole N, DO         Discharge Medications: Please see discharge summary for a list of discharge medications.  Relevant Imaging Results:  Relevant Lab Results:   Additional Information SSN [id number] [place], [place]

## 2024-04-26 DIAGNOSIS — R4182 Altered mental status, unspecified: Secondary | ICD-10-CM | POA: Diagnosis not present

## 2024-04-26 LAB — MISC LABCORP TEST (SEND OUT)
LabCorp test name: 716811
Labcorp test code: 716811

## 2024-04-26 LAB — GLUCOSE, CAPILLARY: Glucose-Capillary: 121 mg/dL — ABNORMAL HIGH (ref 70–99)

## 2024-04-26 MED ORDER — ACETAMINOPHEN 500 MG PO TABS: 500.0000 mg | ORAL_TABLET | Freq: Three times a day (TID) | ORAL | Status: AC | PRN

## 2024-04-26 MED ORDER — CARVEDILOL 3.125 MG PO TABS: 3.1250 mg | ORAL_TABLET | Freq: Two times a day (BID) | ORAL | Status: DC

## 2024-04-26 MED ORDER — AMLODIPINE BESYLATE 10 MG PO TABS: 10.0000 mg | ORAL_TABLET | Freq: Every day | ORAL | Status: DC

## 2024-04-26 NOTE — Discharge Summary (Signed)
 [name] FMW:[id number] DOB: [birth year] DOA: 04/23/2024  PCP: Aletha Bene, MD  Admit date: 04/23/2024  Discharge date: 04/26/2024  Admitted From: SNF   Disposition:  SNF   Recommendations for Outpatient Follow-up:   Follow up with PCP in 1-2 weeks  PCP Please obtain BMP/CBC, 2 view CXR in 1week,  (see Discharge instructions)   PCP Please follow up on the following pending results: Minimize narcotics, benzodiazepines, muscle relaxants and other sedating medications   Home Health: None Equipment/Devices: None Consultations: Neurology Discharge Condition: Stable    CODE STATUS: Full    Diet Recommendation: Heart Healthy Low Carb    Chief Complaint  Patient presents with   Altered Mental Status     Brief history of present illness from the day of admission and additional interim summary    73 y.o. male with medical history significant for type 2 diabetes, peripheral neuropathy, hyperlipidemia, hypertension, chronic tremors, mild cognitive impairment, parkinsonian features, who presents to the ER due to worsening tremors for the past 2 days.  Associated with some confusion.  His wife was concerned about possible right facial droop.   In the ER, the patient was seen by neurology/stroke team.  CT head without contrast was nonacute.  MRI brain was negative for acute CVA.  EDP requesting admission for management of tremors.  Admitted by [facility], hospitalist service.                                                                 Hospital Course    Acute metabolic encephalopathy, POA:  CT head followed by MRI brain unremarkable.  Patient is fully alert and oriented and back to baseline now.  Minimize narcotics and muscle relaxants, continue supportive care overall much better.  Charged back to SNF.   AKI on CKD  3B, suspect prerenal - Baseline creatinine appears to be between 1.5-1.8 with GFR 47, Presented with creatinine of 2.0 which has improved to 1.85.  Continue IV fluids, avoid nephrotoxic agent.  Creatinine improved, switched from ARB to Norvasc   Hypertension: Blood pressure improved, placed on Coreg upon discharge.  ARB held due to above.   Chronic tremors, worsening: Tremors first noted maybe about 2 years ago, they were rare and not really that bothersome. They worsened about 3 weeks ago while he was at [facility] but significantly improved with sinemet and reducing gabapentin  and opiod. He was discharged to rehab where he was doing better. Over the last week, would notice brief tremors/jerking that would wake him up at night. Also poor po intake at rehab and not drinking enough water.  Seen by neurology, holding gabapentin  per their recommendations and continuing current dose of Sinemet.  Neurology postdischarge will follow-up with Dr. Evonnie, patient's personal neurologist has left the practice according to the wife.  Overall much  better.   BPH new Flomax    Constipation: On bowel regimen and resolved.   Generalized weakness PT OT evaluation Fall precautions, will be discharged back to SNF for continued PT OT.  Observe fall precautions.   Discharge diagnosis     Principal Problem:   AMS (altered mental status)    Discharge instructions    Discharge Instructions     Discharge instructions   Complete by: As directed    Follow with Primary MD Aletha Bene, MD in 7 days   Get CBC, CMP, Magnesium, 2 view Chest X ray -  checked next visit with your primary MD or SNF MD   Activity: As tolerated with Full fall precautions use walker/cane & assistance as needed  Disposition SNF  Diet: Heart Healthy and low carbohydrate, check CBGs q. ACHS.  Special Instructions: If you have smoked or chewed Tobacco  in the last 2 yrs please stop smoking, stop any regular Alcohol  and or any Recreational drug  use.  On your next visit with your primary care physician please Get Medicines reviewed and adjusted.  Please request your Prim.MD to go over all Hospital Tests and Procedure/Radiological results at the follow up, please get all Hospital records sent to your Prim MD by signing hospital release before you go home.  If you experience worsening of your admission symptoms, develop shortness of breath, life threatening emergency, suicidal or homicidal thoughts you must seek medical attention immediately by calling 911 or calling your MD immediately  if symptoms less severe.  You Must read complete instructions/literature along with all the possible adverse reactions/side effects for all the Medicines you take and that have been prescribed to you. Take any new Medicines after you have completely understood and accpet all the possible adverse reactions/side effects.   Do not drive when taking Pain medications.  Do not take more than prescribed Pain, Sleep and Anxiety Medications  Wear Seat belts while driving.   Increase activity slowly   Complete by: As directed    No wound care   Complete by: As directed        Discharge Medications   Allergies as of 04/26/2024       Reactions   Tape Other (See Comments)   SKIN IS VERY THIN. IT TEARS AND BRUISES VERY EASILY!!   Crestor [rosuvastatin] Diarrhea   Penicillins Hives, Nausea And Vomiting, Swelling, Other (See Comments)   Severe hives and possible swelling of the throat   Welchol [colesevelam Hcl] Diarrhea   Zoloft [sertraline Hcl] Other (See Comments)   Reaction not recalled        Medication List     STOP taking these medications    enalapril  10 MG tablet Commonly known as: VASOTEC    gabapentin  100 MG capsule Commonly known as: NEURONTIN    HYDROcodone -acetaminophen  5-325 MG tablet Commonly known as: NORCO/VICODIN   lidocaine  5 % Commonly known as: LIDODERM    methocarbamol 500 MG tablet Commonly known as: ROBAXIN    traMADol 50 MG tablet Commonly known as: ULTRAM       TAKE these medications    acetaminophen  500 MG tablet Commonly known as: TYLENOL  Take 1 tablet (500 mg total) by mouth every 8 (eight) hours as needed.   allopurinol  300 MG tablet Commonly known as: ZYLOPRIM  Take 300 mg by mouth daily as needed (for gout flares).   aspirin EC 81 MG tablet Take 81 mg by mouth 2 (two) times a week. Swallow whole. Take on Mon and Thurs  carvedilol 3.125 MG tablet Commonly known as: COREG Take 1 tablet (3.125 mg total) by mouth 2 (two) times daily with a meal.   donepezil  5 MG tablet Commonly known as: ARICEPT  Take 5 mg by mouth in the morning.   ezetimibe  10 MG tablet Commonly known as: ZETIA  TAKE 1 TABLET BY MOUTH DAILY FOR CHOLESTEROL What changed:  how much to take how to take this when to take this   Magnesium 250 MG Tabs Take 250 mg by mouth daily.   memantine  28 MG Cp24 24 hr capsule Commonly known as: NAMENDA  XR Take 1 capsule (28 mg total) by mouth daily.   nystatin 100000 UNIT/ML suspension Commonly known as: MYCOSTATIN Take 4 mLs by mouth 3 (three) times daily.   ondansetron  4 MG tablet Commonly known as: ZOFRAN  Take 4 mg by mouth every 4 (four) hours as needed for nausea or vomiting.   polyethylene glycol 17 g packet Commonly known as: MIRALAX / GLYCOLAX Take 17 g by mouth daily.   tamsulosin  0.4 MG Caps capsule Commonly known as: FLOMAX  Take 1 capsule (0.4 mg total) by mouth daily. Help with urination What changed: when to take this   Vitamin D3 125 MCG (5000 UT) Tabs Take 5,000 Units by mouth daily.         Contact information for follow-up providers     Aletha Bene, MD. Schedule an appointment as soon as possible for a visit in 1 week(s).   Specialty: Family Medicine Contact information: [address] [place] [address] [phone]         TatAsberry RAMAN, DO. Schedule an appointment as soon as possible for a visit in 1  week(s).   Specialty: Neurology Contact information: [address] [place]  [address] [place] [address] [phone]              Contact information for after-discharge care     Destination     [facility] .   Service: Skilled Nursing Contact information: [address] [place]  [address] [phone]                     Major procedures and Radiology Reports - PLEASE review detailed and final reports thoroughly  -       MR BRAIN WO CONTRAST Result Date: 04/23/2024 EXAM: MRI BRAIN WITHOUT CONTRAST 04/23/2024 06:51:06 PM TECHNIQUE: Multiplanar multisequence MRI of the head/brain was performed without the administration of intravenous contrast. COMPARISON: MRI of the head dated 04/03/2024. CLINICAL HISTORY: Neuro deficit, acute, stroke suspected. FINDINGS: BRAIN AND VENTRICLES: Age-related atrophy. Mild-to-moderate cerebral white matter disease. There is no restricted diffusion to indicate acute or recent infarction. No intracranial hemorrhage. No mass. No midline shift. No hydrocephalus. The sella is unremarkable. Normal flow voids. ORBITS: No acute abnormality. SINUSES AND MASTOIDS: No acute abnormality. BONES AND SOFT TISSUES: Normal marrow signal. No acute soft tissue abnormality. IMPRESSION: 1. No acute infarction. 2. Age-related atrophy and mild-to-moderate cerebral white matter disease. Electronically signed by: Evalene Coho MD 04/23/2024 06:58 PM EST RP Workstation: [id number]   CT Head Wo Contrast Result Date: 04/23/2024 EXAM: CT HEAD WITHOUT CONTRAST 04/23/2024 06:21:00 PM TECHNIQUE: CT of the head was performed without the administration of intravenous contrast. Automated exposure control, iterative reconstruction, and/or weight based adjustment of the mA/kV was utilized to reduce the radiation dose to as low as reasonably achievable. COMPARISON: 04/02/2024 CLINICAL HISTORY: Delirium FINDINGS: BRAIN AND  VENTRICLES: No acute hemorrhage. No  evidence of acute infarct. Diffuse cerebral atrophy. Chronic small vessel disease throughout the deep white matter. Old left basal ganglia lacunar infarct, stable. No hydrocephalus. No extra-axial collection. No mass effect or midline shift. ORBITS: No acute abnormality. SINUSES: No acute abnormality. SOFT TISSUES AND SKULL: No acute soft tissue abnormality. No skull fracture. IMPRESSION: 1. No acute intracranial abnormality. 2. Diffuse cerebral atrophy and chronic small vessel disease throughout the deep white matter. 3. Stable old left basal ganglia lacunar infarct. Electronically signed by: Franky Crease MD 04/23/2024 06:26 PM EST RP Workstation: [id number]   DG Chest Port 1 View Result Date: 04/23/2024 EXAM: 1 VIEW(S) XRAY OF THE CHEST 04/23/2024 06:01:00 PM COMPARISON: None available. CLINICAL HISTORY: ams FINDINGS: LUNGS AND PLEURA: Small calcified granuloma in the right upper lung. No pulmonary edema. No pleural effusion. No pneumothorax. HEART AND MEDIASTINUM: Aortic atherosclerosis. No acute abnormality of the cardiac and mediastinal silhouettes. BONES AND SOFT TISSUES: No acute osseous abnormality. IMPRESSION: 1. No acute cardiopulmonary process. Electronically signed by: Luke Bun MD 04/23/2024 06:05 PM EST RP Workstation: [id number]   MR CERVICAL SPINE WO CONTRAST Result Date: 04/07/2024 EXAM: MRI CERVICAL SPINE WITHOUT CONTRAST 04/07/2024 08:00:53 AM TECHNIQUE: Multiplanar multisequence MRI of the cervical spine was performed. COMPARISON: CT of the cervical spine dated 04/02/2024 and MRI of the cervical spine dated 03/17/2023. CLINICAL HISTORY: Myelopathy, acute, cervical spine. Myelopathy, acute, cervical spine ; Prior 03/29/23. Myelopathy, acute, cervical spine. Myelopathy, acute, cervical spine ; Prior 03/29/23. FINDINGS: BONES AND ALIGNMENT: Normal alignment. Normal vertebral body heights. Bone marrow signal is unremarkable. SPINAL CORD: Normal spinal cord  size. No abnormal spinal cord signal. SOFT TISSUES: No paraspinal mass. C2-C3: No significant disc herniation. No spinal canal stenosis or neural foraminal narrowing. C3-C4: Chronic degenerative disc disease with right-sided uncovertebral joint hypertrophy and facet arthrosis, with mild-to-moderate central spinal canal stenosis and moderate right neural foraminal stenosis. C4-C5: No significant disc herniation. No spinal canal stenosis or neural foraminal narrowing. C5-C6: No significant disc herniation. No spinal canal stenosis or neural foraminal narrowing. C6-C7: Broad-based disc bulging, with mild-to-moderate central spinal canal stenosis. The neural foramina are patent. C7-T1: No significant disc herniation. No spinal canal stenosis or neural foraminal narrowing. IMPRESSION: 1. Chronic degenerative disc disease at C3-4 with mild-to-moderate central spinal canal stenosis and moderate right neural foraminal stenosis. 2. Broad-based disc bulging at C6-7 with mild-to-moderate central spinal canal stenosis. Neural foramina are patent. Electronically signed by: Evalene Coho MD 04/07/2024 08:50 AM EDT RP Workstation: [id number]   ECHOCARDIOGRAM COMPLETE Result Date: 04/05/2024    ECHOCARDIOGRAM REPORT   Patient Name:   [name] Date of Exam: 04/05/2024 Medical Rec #:  [id number]   Height:       66.0 in Accession #:    [id number]  Weight:       152.6 lb Date of Birth:  [birth year]   BSA:          1.782 m Patient Age:    73 years    BP:           121/72 mmHg Patient Gender: M           HR:           79 bpm. Exam Location:  Inpatient Procedure: 2D Echo, Cardiac Doppler and Color Doppler (Both Spectral and Color            Flow Doppler were utilized during procedure). Indications:    Elevated Troponin  History:        Patient has prior history  of Echocardiogram examinations and                 Patient has no prior history of Echocardiogram examinations.                 Risk Factors:Hypertension and Diabetes.   Sonographer:    Jayson Gaskins Referring Phys: [id number] DAVID MANUEL ORTIZ IMPRESSIONS  1. Left ventricular ejection fraction, by estimation, is 55 to 60%. The left ventricle has normal function. The left ventricle has no regional wall motion abnormalities. Left ventricular diastolic parameters are consistent with Grade I diastolic dysfunction (impaired relaxation).  2. Right ventricular systolic function is normal. The right ventricular size is normal.  3. The mitral valve is normal in structure. No evidence of mitral valve regurgitation.  4. The aortic valve is tricuspid. Aortic valve regurgitation is not visualized. Aortic valve sclerosis is present, with no evidence of aortic valve stenosis.  5. The inferior vena cava is normal in size with greater than 50% respiratory variability, suggesting right atrial pressure of 3 mmHg. Comparison(s): No prior Echocardiogram. FINDINGS  Left Ventricle: Left ventricular ejection fraction, by estimation, is 55 to 60%. The left ventricle has normal function. The left ventricle has no regional wall motion abnormalities. The left ventricular internal cavity size was normal in size. There is  no left ventricular hypertrophy. Left ventricular diastolic parameters are consistent with Grade I diastolic dysfunction (impaired relaxation). Right Ventricle: The right ventricular size is normal. No increase in right ventricular wall thickness. Right ventricular systolic function is normal. Left Atrium: Left atrial size was normal in size. Right Atrium: Right atrial size was normal in size. Pericardium: There is no evidence of pericardial effusion. Mitral Valve: The mitral valve is normal in structure. No evidence of mitral valve regurgitation. Tricuspid Valve: The tricuspid valve is grossly normal. Tricuspid valve regurgitation is trivial. Aortic Valve: The aortic valve is tricuspid. Aortic valve regurgitation is not visualized. Aortic valve sclerosis is present, with no evidence of aortic  valve stenosis. Aortic valve mean gradient measures 4.0 mmHg. Aortic valve peak gradient measures 7.1  mmHg. Aortic valve area, by VTI measures 1.76 cm. Pulmonic Valve: The pulmonic valve was grossly normal. Pulmonic valve regurgitation is trivial. Aorta: The aortic root is normal in size and structure. Venous: The inferior vena cava is normal in size with greater than 50% respiratory variability, suggesting right atrial pressure of 3 mmHg. IAS/Shunts: The interatrial septum was not well visualized.  LEFT VENTRICLE PLAX 2D LVIDd:         3.70 cm   Diastology LVIDs:         2.10 cm   LV e' medial:    5.98 cm/s LV PW:         0.90 cm   LV E/e' medial:  9.5 LV IVS:        0.80 cm   LV e' lateral:   6.74 cm/s LVOT diam:     1.90 cm   LV E/e' lateral: 8.4 LV SV:         41 LV SV Index:   23 LVOT Area:     2.84 cm  RIGHT VENTRICLE RV Basal diam:  3.50 cm RV Mid diam:    3.00 cm LEFT ATRIUM             Index        RIGHT ATRIUM           Index LA Vol (A2C):   27.6 ml 15.48 ml/m  RA Area:     15.10 cm LA Vol (A4C):   21.9 ml 12.29 ml/m  RA Volume:   37.70 ml  21.15 ml/m LA Biplane Vol: 26.1 ml 14.64 ml/m  AORTIC VALVE AV Area (Vmax):    1.66 cm AV Area (Vmean):   1.68 cm AV Area (VTI):     1.76 cm AV Vmax:           133.00 cm/s AV Vmean:          97.400 cm/s AV VTI:            0.231 m AV Peak Grad:      7.1 mmHg AV Mean Grad:      4.0 mmHg LVOT Vmax:         77.70 cm/s LVOT Vmean:        57.700 cm/s LVOT VTI:          0.143 m LVOT/AV VTI ratio: 0.62  AORTA Ao Root diam: 2.60 cm MITRAL VALVE MV Area (PHT): 2.33 cm    SHUNTS MV Decel Time: 325 msec    Systemic VTI:  0.14 m MV E velocity: 56.60 cm/s  Systemic Diam: 1.90 cm MV A velocity: 72.80 cm/s MV E/A ratio:  0.78 Emeline Calender Electronically signed by Emeline Calender Signature Date/Time: 04/05/2024/10:10:09 AM    Final    MR LUMBAR SPINE WO CONTRAST Result Date: 04/03/2024 EXAM: MRI LUMBAR SPINE 04/03/2024 06:54:00 PM TECHNIQUE: Multiplanar multisequence MRI of  the lumbar spine was performed without the administration of intravenous contrast. COMPARISON: Comparison to prior MRI from 03/17/2023. CLINICAL HISTORY: Low back pain, trauma. Neuro deficit, acute, stroke suspected. Low back pain, trauma. FINDINGS: BONES AND ALIGNMENT: Normal alignment. Acute compression fracture involving the superior endplate of L1 associated height loss measures up to 35% without significant retropulsion. This is benign/mechanical in appearance. Chronic compression fracture of T12 with up to 50% height loss and trace 2 mm bony retropulsion. Vertebral body height otherwise maintained with no other acute or chronic fracture. Bone marrow signal intensity within normal limits. Probable small atypical hemangioma noted within the S2 segment. No worrisome osseous lesions. Trace facet mediastinal osseous L5 on S1. Low waveform disc space labeled the L5 S1 level. SPINAL CORD: The conus medullaris terminates at the level of L1. SOFT TISSUES: Paraspinal soft tissues demonstrate no acute findings. Few scattered T2 hyperintense cysts noted about the visualized kidneys, benign in appearance, no follow up imaging recommended. T11-T12: Mild disc bulge with 2 mm bony retropulsion related to the chronic T12 fracture. No significant spinal stenosis. Foramina appear grossly patent. T12-L1: Minimal disc bulge superimposed left foraminal disc protrusion (series 12, image 7). No spinal stenosis. Mild bilateral foraminal narrowing. L1-L2: Mild disc bulge superimposed left foraminal and extraforaminal disc protrusion closely approximates to the left L1 nerve root. No spinal stenosis. Mild bilateral foraminal narrowing. L2-L3: Mild disc bulge with endplate spurring. Mild facet hypertrophy. Result in mild narrowing of the right lateral recess. Central canal remains patent. Mild bilateral L2 foraminal narrowing. L3-L4: Mild disc bulge with endplate spurring. Superimposed left foraminal to extraforaminal disc protrusion  (series 13, image 28). Mild bilateral facet spurring. No spinal stenosis. Moderate left with mild right L3 foraminal narrowing. L4-L5: Mild disc bulge. Mild endplate spurring. Mild bilateral facet hypertrophy. No significant spinal stenosis. Mild right with moderate left L4 foraminal stenosis. L5-S1: Negative interspace. Moderate right worse than left facet hypertrophy. Resultant mild bilateral subarticular stenosis. Central canal remains patent. No significant foraminal stenosis. IMPRESSION: 1. Acute compression  fracture of L1 superior endplate with up to 35% height loss, but with no significant retropulsion. 2. Chronic compression fracture of T12 with up to 50% height loss and trace (2 mm) bony retropulsion. 3. Underlying multilevel lumbar spondylosis without significant spinal stenosis. . Mild to moderate multilevel foraminal narrowing as above. Electronically signed by: Morene Hoard MD 04/03/2024 07:18 PM EDT RP Workstation: [id number]   MR BRAIN WO CONTRAST Result Date: 04/03/2024 EXAM: MRI BRAIN WITHOUT CONTRAST 04/03/2024 06:54:00 PM TECHNIQUE: Multiplanar multisequence MRI of the head/brain was performed without the administration of intravenous contrast. COMPARISON: Comparison with prior CT from 04/02/2024. CLINICAL HISTORY: Neuro deficit, acute, stroke suspected. Neuro deficit, acute, stroke suspected. Low back pain, trauma. Neuro deficit, acute, stroke suspected. Neuro deficit, acute, stroke suspected. Low back pain, trauma. FINDINGS: BRAIN AND VENTRICLES: No acute infarct. No intracranial hemorrhage. No mass. No midline shift. No hydrocephalus. Mild age related cerebral atrophy with chronic small vessel ischemic disease. Few small remote lacunar infarcts present about the bilateral corona radiata/basal ganglia. The sella is unremarkable. Normal flow voids. ORBITS: Prior oculolens replacement on the left. No acute abnormality. SINUSES AND MASTOIDS: No acute abnormality. BONES AND SOFT TISSUES:  Normal marrow signal. No acute soft tissue abnormality. IMPRESSION: 1. No acute intracranial abnormality. 2. Mild age-related cerebral atrophy with chronic small vessel ischemic disease. Electronically signed by: Morene Hoard MD 04/03/2024 07:10 PM EDT RP Workstation: [id number]   CT Cervical Spine Wo Contrast Result Date: 04/02/2024 EXAM: CT CERVICAL SPINE WITHOUT CONTRAST 04/02/2024 09:29:12 AM TECHNIQUE: CT of the cervical spine was performed without the administration of intravenous contrast. Multiplanar reformatted images are provided for review. Automated exposure control, iterative reconstruction, and/or weight based adjustment of the mA/kV was utilized to reduce the radiation dose to as low as reasonably achievable. COMPARISON: None available. CLINICAL HISTORY: Ataxia, cervical trauma. BIB EMS due to fall. Pt states fell and hit head on glass table due to leg weakness about 6:55 AM. Skin tear to back of head. Denies dizziness, LOC, not on thinners, and no new pain. Pt complains of worsening tremors upon walking. FINDINGS: CERVICAL SPINE: BONES AND ALIGNMENT: No acute fracture or traumatic malalignment. DEGENERATIVE CHANGES: Mild endplate osteophytes at multiple levels. Small disc bulges at multiple levels throughout the cervical spine. Facet arthrosis and uncovertebral hypertrophy at multiple levels. Foraminal stenosis is most pronounced on the right at C3-C4. There is mild to moderate spinal canal stenosis at C3-C4. SOFT TISSUES: No prevertebral soft tissue swelling. IMPRESSION: 1. No acute abnormality of the cervical spine related to the reported trauma. 2. Mild to moderate spinal canal stenosis at C3-C4. Foraminal stenosis most pronounced on the right at C3-C4. Electronically signed by: Donnice Mania MD 04/02/2024 09:51 AM EDT RP Workstation: [id number]   DG Hip Unilat With Pelvis 2-3 Views Right Result Date: 04/02/2024 EXAM: 2 OR MORE VIEW(S) XRAY OF THE UNILATERAL HIP 04/02/2024 09:19:00  AM COMPARISON: None available. CLINICAL HISTORY: pain. Per chart-Pt BIB EMS due to fall. Pt states fell and hit head on glass table table due to leg weakness about 655am. Skin tear to back of head. Denies dizziness, LOC, not on thinners, and no new pain. Pt complains of worsening tremors apon walking FINDINGS: BONES AND JOINTS: No acute fracture or focal osseous lesion. Osteophytosis of the superior acetabulum. Mild degenerative changes of bilateral hip joints without significant joint space narrowing. The hip joint is maintained. SOFT TISSUES: Vascular calcifications. IMPRESSION: 1. No acute findings. 2. Mild degenerative changes of bilateral hip joints without significant joint space  narrowing. Electronically signed by: Donnice Mania MD 04/02/2024 09:47 AM EDT RP Workstation: [id number]   CT Head Wo Contrast Result Date: 04/02/2024 EXAM: CT HEAD WITHOUT CONTRAST 04/02/2024 09:29:12 AM TECHNIQUE: CT of the head was performed without the administration of intravenous contrast. Automated exposure control, iterative reconstruction, and/or weight based adjustment of the mA/kV was utilized to reduce the radiation dose to as low as reasonably achievable. COMPARISON: CT head 03/18/2024. CLINICAL HISTORY: Ataxia, head trauma. BIB EMS due to fall. Pt states fell and hit head on glass table due to leg weakness about 6:55 AM. Skin tear to back of head. Denies dizziness, LOC, not on thinners, and no new pain. Pt complains of worsening tremors upon walking. FINDINGS: BRAIN AND VENTRICLES: No acute hemorrhage. No evidence of acute infarct. Generalized parenchymal volume loss. Nonspecific hypoattenuation in the periventricular and subcortical white matter, most likely representing chronic microvascular ischemic changes. 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 No hydrocephalus. No extra-axial collection. No mass effect or midline shift. Left lens replacement. ORBITS: No acute abnormality. SINUSES: No acute  abnormality. SOFT TISSUES AND SKULL: No acute soft tissue abnormality. No skull fracture. VASCULATURE: Atherosclerosis of the carotid siphons. IMPRESSION: 1. No acute intracranial abnormality. 2. Generalized parenchymal volume loss and mild chronic microvascular ischemic changes. 3. 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 Electronically signed by: Donnice Mania MD 04/02/2024 09:44 AM EDT RP Workstation: [id number]    Micro Results     No results found for this or any previous visit (from the past 240 hours).  Today   Subjective    [name] today has no headache,no chest abdominal pain,no new weakness tingling or numbness, feels much better     Objective   Blood pressure 134/82, pulse 75, temperature 97.8 F (36.6 C), temperature source Oral, resp. rate 18, height 5' 6 (1.676 m), weight 66.7 kg, SpO2 96%.   Intake/Output Summary (Last 24 hours) at 04/26/2024 0756 Last data filed at 04/26/2024 0356 Gross per 24 hour  Intake --  Output 1200 ml  Net -1200 ml    Exam  Awake Alert, No new F.N deficits,    [place].AT,PERRAL Supple Neck,   Symmetrical Chest wall movement, Good air movement bilaterally, CTAB RRR,No Gallops,   +ve B.Sounds, Abd Soft, Non tender,  No Cyanosis, Clubbing or edema    Data Review   Recent Labs  Lab 04/23/24 1534 04/24/24 0057 04/25/24 0302  WBC 9.1 10.8* 7.3  HGB 16.5 16.5 15.1  HCT 50.2 48.4 44.7  PLT 261 276 248  MCV 84.5 81.8 82.5  MCH 27.8 27.9 27.9  MCHC 32.9 34.1 33.8  RDW 13.0 12.8 13.2  LYMPHSABS  --   --  1.6  MONOABS  --   --  0.7  EOSABS  --   --  0.4  BASOSABS  --   --  0.1    Recent Labs  Lab 04/23/24 1534 04/24/24 0057 04/25/24 0302  NA 138 139 138  K 3.7 3.6 4.7  CL 102 104 104  CO2 26 16* 22  ANIONGAP 10 19* 12  GLUCOSE 140* 134* 138*  BUN 23 22 14   CREATININE 2.00* 1.85* 1.73*  AST 21  --  20  ALT 6  --  6  ALKPHOS 115  --  105  BILITOT 0.8  --  0.9  ALBUMIN 4.1  --  3.7  MG  --  1.9  --   PHOS  --   <1.0* 6.8*  CALCIUM  9.4 9.3  8.9   Lab Results  Component Value Date   HGBA1C 6.3 (H) 12/06/2023    Total Time in preparing paper work, data evaluation and todays exam - 35 minutes  Signature  -    Lavada Stank M.D on 04/26/2024 at 7:56 AM   -  To page go to [email or web address]

## 2024-04-26 NOTE — Plan of Care (Signed)
   Problem: Education: Goal: Knowledge of General Education information will improve Description Including pain rating scale, medication(s)/side effects and non-pharmacologic comfort measures Outcome: Progressing   Problem: Clinical Measurements: Goal: Ability to maintain clinical measurements within normal limits will improve Outcome: Progressing   Problem: Activity: Goal: Risk for activity intolerance will decrease Outcome: Progressing   Problem: Nutrition: Goal: Adequate nutrition will be maintained Outcome: Progressing   Problem: Safety: Goal: Ability to remain free from injury will improve Outcome: Progressing   Problem: Skin Integrity: Goal: Risk for impaired skin integrity will decrease Outcome: Progressing

## 2024-04-26 NOTE — TOC Progression Note (Addendum)
 Transition of Care [facility]) - Progression Note    Patient Details  Name: [name] MRN: [id number] Date of Birth: 18-Apr-1951  Transition [facility]) CM/SW Contact  Inocente GORMAN Kindle, LCSW Phone Number: 04/26/2024, 8:49 AM  Clinical Narrative:    8:49 AM-Awaiting insurance approval for [place].   8:58 AM-Received request from insurance for a Peer to Peer with Dr. Janit due by noon. Contact info provided to MD.   Ambulance transportation was approved, Auth # [id number].  10am-Per MD, Peer to Peer was denied per MD.  11:28 AM-CSW updated patient's spouse. She requested CSW to appeal the denial. CSW provided instruction on how to begin the Medicaid application process which she will complete on Monday as she is POA.   5:25 PM-CSW received official denial from [facility] and completed appeal and faxed to [phone] for an expedited appeal.     Expected Discharge Plan: Skilled Nursing Facility Barriers to Discharge: Insurance authorization               Expected Discharge Plan and Services In-house Referral: Clinical Social Work     Living arrangements for the past 2 months: Single Family Home Expected Discharge Date: 04/26/24                                     Social Drivers of Health (SDOH) Interventions SDOH Screenings   Food Insecurity: No Food Insecurity (04/24/2024)  Housing: Low Risk  (04/24/2024)  Transportation Needs: Unmet Transportation Needs (04/24/2024)  Utilities: Not At Risk (04/24/2024)  Depression (PHQ2-9): Low Risk  (04/12/2023)  Financial Resource Strain: Low Risk  (09/28/2023)   Received from [facility]  Social Connections: Moderately Integrated (04/24/2024)  Tobacco Use: Low Risk  (04/23/2024)    Readmission Risk Interventions    04/10/2024   12:44 PM  Readmission Risk Prevention Plan  Post Dischage Appt Complete  Medication Screening Complete  Transportation Screening Complete

## 2024-04-26 NOTE — Discharge Instructions (Signed)
 Follow with Primary MD Aletha Bene, MD in 7 days   Get CBC, CMP, Magnesium, 2 view Chest X ray -  checked next visit with your primary MD or SNF MD   Activity: As tolerated with Full fall precautions use walker/cane & assistance as needed  Disposition SNF  Diet: Heart Healthy and low carbohydrate, check CBGs q. ACHS.  Special Instructions: If you have smoked or chewed Tobacco  in the last 2 yrs please stop smoking, stop any regular Alcohol  and or any Recreational drug use.  On your next visit with your primary care physician please Get Medicines reviewed and adjusted.  Please request your Prim.MD to go over all Hospital Tests and Procedure/Radiological results at the follow up, please get all Hospital records sent to your Prim MD by signing hospital release before you go home.  If you experience worsening of your admission symptoms, develop shortness of breath, life threatening emergency, suicidal or homicidal thoughts you must seek medical attention immediately by calling 911 or calling your MD immediately  if symptoms less severe.  You Must read complete instructions/literature along with all the possible adverse reactions/side effects for all the Medicines you take and that have been prescribed to you. Take any new Medicines after you have completely understood and accpet all the possible adverse reactions/side effects.   Do not drive when taking Pain medications.  Do not take more than prescribed Pain, Sleep and Anxiety Medications  Wear Seat belts while driving.

## 2024-04-26 NOTE — Progress Notes (Signed)
 Physical Therapy Treatment Patient Details Name: [name] MRN: [id number] DOB: Jul 08, 1950 Today's Date: 04/26/2024   History of Present Illness [name] is a 73 y.o. male presented 04/23/24 from [facility] to ED with worsening tremors, increased confusion, right eye looking different. MRI and CT -negative for acute event. 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 He did not slide anteriorly but did maintain a R lateral lean in sitting (chair or EOB) and standing today. He was also able to progress to standing several times with only modAx1 while using a stedy to provide him with an increased sense of security as he is anxious with a fear of falling. The pt also began to perform pre-gait training, taking steps in place while standing in the stedy, needing modA for balance and cuing to shift weight, extend the stance leg's knee, and march the contralateral leg. He continues to display deficits in balance, power, strength, and activity tolerance that place him at risk for falls. Will continue to follow acutely.     If plan is discharge home, recommend the following: A lot of help with bathing/dressing/bathroom;Assist for transportation;Help with stairs or ramp for entrance;Direct supervision/assist for medications management;Direct supervision/assist for financial management;Assistance with cooking/housework;Two people to help with walking and/or transfers;Supervision due to cognitive status   Can travel by private vehicle     No  Equipment Recommendations  Rolling walker (2 wheels);BSC/3in1;Wheelchair (measurements PT);Wheelchair cushion (measurements PT);Hospital bed;Hoyer lift (pending progress)    Recommendations for Other Services       Precautions /  Restrictions Precautions Precautions: Fall;Back Precaution Booklet Issued: No (verbally reviewed) Recall of Precautions/Restrictions: Impaired Precaution/Restrictions Comments: reviewed back precautions Required Braces or Orthoses: Other Brace;Spinal Brace Spinal Brace: Thoracolumbosacral orthotic;Applied in sitting position Restrictions Weight Bearing Restrictions Per Provider Order: No     Mobility  Bed Mobility Overal bed mobility: Needs Assistance Bed Mobility: Rolling, Sidelying to Sit Rolling: Mod assist, Used rails Sidelying to sit: Max assist, HOB elevated       General bed mobility comments: Cues pt to flex L leg and reach L UE to R rail to log roll to R with modA at trunk and hips to rotate, tremors increasing with this movement. Cues then provided to bring bil legs off R EOB with assistance provided and cues provided to grab onto therapist's shoulders to pull up to sit, maxA needed at trunk due to noted R lateral lean in sitting.    Transfers Overall transfer level: Needs assistance Equipment used: Ambulation equipment used Transfers: Bed to chair/wheelchair/BSC, Sit to/from Stand Sit to Stand: Mod assist           General transfer comment: Utilized stedy to transfer pt OOB to recliner. Cues and assistance needed to place feet on either end of foot board and to grab onto stedy bar to prepare for transfer. R foot often had increased tremors and difficulty keeping R foot on the stedy foot plate. Extra time and effort from pt to initiate transfers sit to stand in stedy, 1x from elevated EOB, 1x from stedy flaps, and 1x from recliner, all with modA to power up to stand and balance. Transfer via Lift Equipment: Stedy  Ambulation/Gait Ambulation/Gait assistance: Mod assist  Pre-gait activities: Lateral weight shifting with contralateral foot lift off stedy platform while standing in stedy, cuing pt to shift weight, extend stance knee, and march contralateral  foot. ModA for balance and bil UE support on stedy bar, ~6-7 steps each foot     Stairs             Wheelchair Mobility     Tilt Bed    Modified Rankin (Stroke Patients Only)       Balance Overall balance assessment: Needs assistance Sitting-balance support: Bilateral upper extremity supported, Feet supported Sitting balance-Leahy Scale: Poor Sitting balance - Comments: leans to R sitting EOB and in chair, minA with intermittent CGA for safety Postural control: Right lateral lean Standing balance support: Bilateral upper extremity supported Standing balance-Leahy Scale: Poor Standing balance comment: reliant on bil UE support in stedy and modA for balance, R lateral lean noted                            Communication Communication Communication: Impaired Factors Affecting Communication: Hearing impaired  Cognition Arousal: Alert Behavior During Therapy: Anxious, Flat affect   PT - Cognitive impairments: Problem solving, Safety/Judgement, Sequencing, Initiation, History of cognitive impairments                       PT - Cognition Comments: Wife reports hx of dementia and memory loss at baseline. Anxious about falling with increased time to follow commands and to re-assure pt. Step-by-step cues to sequence mobility and maintain spinal precautions throughout Following commands: Impaired Following commands impaired: Follows one step commands inconsistently, Follows one step commands with increased time    Cueing Cueing Techniques: Verbal cues, Tactile cues  Exercises      General Comments General comments (skin integrity, edema, etc.): Called wife to update her on PT session pre and post session. Wife appreciative of update.      Pertinent Vitals/Pain Pain Assessment Pain Assessment: Faces Faces Pain Scale: Hurts a little bit Pain Location: generalized with mobility Pain Descriptors / Indicators: Discomfort, Grimacing, Guarding Pain  Intervention(s): Limited activity within patient's tolerance, Monitored during session, Repositioned    Home Living                          Prior Function            PT Goals (current goals can now be found in the care plan section) Acute Rehab PT Goals Patient Stated Goal: to go back to rehab PT Goal Formulation: With patient/family Time For Goal Achievement: 05/08/24 Potential to Achieve Goals: Good Progress towards PT goals: Progressing toward goals    Frequency    Min 2X/week      PT Plan      Co-evaluation              AM-PAC PT 6 Clicks Mobility   Outcome Measure  Help needed turning from your back to your side while in a flat bed without using bedrails?: A Lot Help needed moving from lying on your back to sitting on the side of a flat bed without using bedrails?: A Lot Help needed moving to and from a bed to a chair (including a wheelchair)?: A Lot Help needed standing up from a chair using your arms (e.g., wheelchair or bedside chair)?: A Lot Help needed to walk in hospital room?: Total Help needed climbing 3-5 steps with a railing? :  Total 6 Click Score: 10    End of Session Equipment Utilized During Treatment: Gait belt;Back brace Activity Tolerance: Patient tolerated treatment well Patient left: with call bell/phone within reach;in chair;with chair alarm set;with family/visitor present Nurse Communication: Mobility status;Need for lift equipment (stedy) PT Visit Diagnosis: Other abnormalities of gait and mobility (R26.89);Muscle weakness (generalized) (M62.81);Other symptoms and signs involving the nervous system (R29.898);Unsteadiness on feet (R26.81);Difficulty in walking, not elsewhere classified (R26.2)     Time: 8490-8464 PT Time Calculation (min) (ACUTE ONLY): 26 min  Charges:    $Therapeutic Activity: 23-37 mins PT General Charges $$ ACUTE PT VISIT: 1 Visit                     Theo Ferretti, PT, DPT Acute Rehabilitation  Services  Office: [phone]    Theo CHRISTELLA Ferretti 04/26/2024, 3:50 PM

## 2024-04-27 DIAGNOSIS — R251 Tremor, unspecified: Secondary | ICD-10-CM

## 2024-04-27 LAB — GLUCOSE, CAPILLARY
Glucose-Capillary: 146 mg/dL — ABNORMAL HIGH (ref 70–99)
Glucose-Capillary: 120 mg/dL — ABNORMAL HIGH (ref 70–99)

## 2024-04-27 NOTE — Plan of Care (Signed)
  Problem: Clinical Measurements: Goal: Ability to maintain clinical measurements within normal limits will improve Outcome: Progressing Goal: Diagnostic test results will improve Outcome: Progressing   Problem: Activity: Goal: Risk for activity intolerance will decrease Outcome: Progressing   Problem: Safety: Goal: Ability to remain free from injury will improve Outcome: Progressing   Problem: Skin Integrity: Goal: Risk for impaired skin integrity will decrease Outcome: Progressing

## 2024-04-27 NOTE — Progress Notes (Signed)
 [facility]                                                                                                                                                                         Patient Demographics  [name], is a 73 y.o. male  RDW:[id number]  FMW:[id number]  DOB - May 18, 1951  Admit date - 04/23/2024  Admitting Physician Terry LOISE Hurst, DO  Outpatient Primary MD for the patient is Aletha Bene, MD  LOS - 3   Chief Complaint  Patient presents with   Altered Mental Status        Assessment & Plan    Patient seen briefly today, he was discharged yesterday to SNF however his placement was denied by his insurance, he is symptom-free except for generalized weakness, await appropriate placement.   Medications  Scheduled Meds:  aspirin EC  81 mg Oral Once per day on Monday Thursday   carbidopa-levodopa  1 tablet Oral TID   carvedilol  3.125 mg Oral BID WC   cholecalciferol  5,000 Units Oral Daily   donepezil   5 mg Oral q AM   enoxaparin (LOVENOX) injection  40 mg Subcutaneous Q24H   ezetimibe   10 mg Oral QHS   memantine   28 mg Oral Daily   polyethylene glycol  17 g Oral Daily   tamsulosin   0.4 mg Oral Daily   Continuous Infusions: PRN Meds:.acetaminophen , allopurinol , hydrALAZINE, melatonin    Time Spent in minutes   10 minutes   Lavada Stank M.D on 04/27/2024 at 8:32 AM  Between 7am to 7pm - Pager - [phone]  After 7pm go to [email or web address] - password [email or web address]  And look for the night coverage person covering for me after hours  [facility] Office  [phone]    Subjective:   [name] today has, No headache, No chest pain, No abdominal pain - No Nausea, No new weakness tingling or numbness, No Cough - SOB.    Objective:   Vitals:   04/26/24 1932 04/27/24 0026 04/27/24 0352 04/27/24 0802  BP: 136/85 131/79 (!) 129/96 (!) 140/79  Pulse: 73 78 74 76  Resp:  18 18 18    Temp: 97.6 F (36.4 C) 97.7 F (36.5 C) (!) 97.4 F (36.3 C) (!) 97.4 F (36.3 C)  TempSrc: Oral Oral Oral Oral  SpO2: 96% 96% 97% 97%  Weight:      Height:        Wt Readings from Last 3 Encounters:  04/23/24 66.7 kg  04/10/24 71.6 kg  03/18/24 72.6 kg     Intake/Output Summary (Last 24 hours) at 04/27/2024 0832 Last data filed at 04/27/2024 0616 Gross per 24 hour  Intake --  Output 550 ml  Net -550  ml    Exam  Awake Alert, No new F.N deficits, Normal affect [place].AT,PERRAL Supple Neck, No JVD,   Symmetrical Chest wall movement, Good air movement bilaterally, CTAB RRR,No Gallops, Rubs or new Murmurs,  +ve B.Sounds, Abd Soft, No tenderness,   No Cyanosis, Clubbing or edema   Data Review

## 2024-04-27 NOTE — TOC Progression Note (Signed)
 Transition of Care [facility]) - Progression Note    Patient Details  Name: [name] MRN: [id number] Date of Birth: [birth year]  Transition [facility]) CM/SW Contact  Dino CHRISTELLA Au, LCSWA Phone Number: 04/27/2024, 11:47 AM  Clinical Narrative:     SW spoke with Geni (HTA [phone]) confirmed receipt of appeal clinicals. Has been sent for review.   Expected Discharge Plan: Skilled Nursing Facility Barriers to Discharge: Continued Medical Work up               Expected Discharge Plan and Services In-house Referral: Clinical Social Work     Living arrangements for the past 2 months: Single Family Home Expected Discharge Date: 04/26/24                                     Social Drivers of Health (SDOH) Interventions SDOH Screenings   Food Insecurity: No Food Insecurity (04/24/2024)  Housing: Low Risk  (04/24/2024)  Transportation Needs: Unmet Transportation Needs (04/24/2024)  Utilities: Not At Risk (04/24/2024)  Depression (PHQ2-9): Low Risk  (04/12/2023)  Financial Resource Strain: Low Risk  (09/28/2023)   Received from [facility]  Social Connections: Moderately Integrated (04/24/2024)  Tobacco Use: Low Risk  (04/23/2024)    Readmission Risk Interventions    04/10/2024   12:44 PM  Readmission Risk Prevention Plan  Post Dischage Appt Complete  Medication Screening Complete  Transportation Screening Complete

## 2024-04-28 DIAGNOSIS — R251 Tremor, unspecified: Secondary | ICD-10-CM | POA: Diagnosis not present

## 2024-04-28 LAB — GLUCOSE, CAPILLARY
Glucose-Capillary: 131 mg/dL — ABNORMAL HIGH (ref 70–99)
Glucose-Capillary: 155 mg/dL — ABNORMAL HIGH (ref 70–99)

## 2024-04-28 NOTE — Progress Notes (Signed)
 [facility]                                                                                                                                                                         Patient Demographics  [name], is a 73 y.o. male  RDW:[id number]  FMW:[id number]  DOB - Sep 01, 1950  Admit date - 04/23/2024  Admitting Physician Terry LOISE Hurst, DO  Outpatient Primary MD for the patient is Aletha Bene, MD  LOS - 4   Chief Complaint  Patient presents with   Altered Mental Status        Assessment & Plan    Patient seen briefly today, he was discharged yesterday to SNF however his placement was denied by his insurance, he is symptom-free except for generalized weakness, await appropriate placement.   Medications  Scheduled Meds:  aspirin EC  81 mg Oral Once per day on Monday Thursday   carbidopa-levodopa  1 tablet Oral TID   carvedilol  3.125 mg Oral BID WC   cholecalciferol  5,000 Units Oral Daily   donepezil   5 mg Oral q AM   enoxaparin (LOVENOX) injection  40 mg Subcutaneous Q24H   ezetimibe   10 mg Oral QHS   memantine   28 mg Oral Daily   polyethylene glycol  17 g Oral Daily   tamsulosin   0.4 mg Oral Daily   Continuous Infusions: PRN Meds:.acetaminophen , allopurinol , hydrALAZINE, melatonin    Time Spent in minutes   10 minutes   Lavada Stank M.D on 04/28/2024 at 9:06 AM  Between 7am to 7pm - Pager - [phone]  After 7pm go to [email or web address] - password [email or web address]  And look for the night coverage person covering for me after hours  [facility] Office  [phone]    Subjective:   [name] today has, No headache, No chest pain, No abdominal pain - No Nausea, No new weakness tingling or numbness, No Cough - SOB.    Objective:   Vitals:   04/27/24 2028 04/28/24 0015 04/28/24 0339 04/28/24 0743  BP: 129/72 125/79 135/88 (!) 149/83  Pulse: 77 67 68 78  Resp: 18  18 16    Temp: 98.6 F (37 C) (!) 97.5 F (36.4 C) 97.7 F (36.5 C) 98.4 F (36.9 C)  TempSrc: Oral Oral Oral Oral  SpO2: 94% 95% 96% 97%  Weight:      Height:        Wt Readings from Last 3 Encounters:  04/23/24 66.7 kg  04/10/24 71.6 kg  03/18/24 72.6 kg     Intake/Output Summary (Last 24 hours) at 04/28/2024 0906 Last data filed at 04/27/2024 2030 Gross per 24 hour  Intake 240 ml  Output 700 ml  Net -460 ml  Exam  Awake Alert, No new F.N deficits, Normal affect [place].AT,PERRAL Supple Neck, No JVD,   Symmetrical Chest wall movement, Good air movement bilaterally, CTAB RRR,No Gallops, Rubs or new Murmurs,  +ve B.Sounds, Abd Soft, No tenderness,   No Cyanosis, Clubbing or edema   Data Review

## 2024-04-29 DIAGNOSIS — R251 Tremor, unspecified: Secondary | ICD-10-CM | POA: Diagnosis not present

## 2024-04-29 LAB — CBC WITH DIFFERENTIAL/PLATELET
Abs Immature Granulocytes: 0.02 K/uL (ref 0.00–0.07)
Basophils Absolute: 0.1 K/uL (ref 0.0–0.1)
Basophils Relative: 1 %
Eosinophils Absolute: 0.5 K/uL (ref 0.0–0.5)
Eosinophils Relative: 7 %
HCT: 49.3 % (ref 39.0–52.0)
Hemoglobin: 16.7 g/dL (ref 13.0–17.0)
Immature Granulocytes: 0 %
Lymphocytes Relative: 24 %
Lymphs Abs: 1.7 K/uL (ref 0.7–4.0)
MCH: 28.1 pg (ref 26.0–34.0)
MCHC: 33.9 g/dL (ref 30.0–36.0)
MCV: 82.9 fL (ref 80.0–100.0)
Monocytes Absolute: 0.6 K/uL (ref 0.1–1.0)
Monocytes Relative: 9 %
Neutro Abs: 4.2 K/uL (ref 1.7–7.7)
Neutrophils Relative %: 59 %
Platelets: 264 K/uL (ref 150–400)
RBC: 5.95 MIL/uL — ABNORMAL HIGH (ref 4.22–5.81)
RDW: 12.6 % (ref 11.5–15.5)
WBC: 7 K/uL (ref 4.0–10.5)
nRBC: 0 % (ref 0.0–0.2)

## 2024-04-29 LAB — BASIC METABOLIC PANEL WITH GFR
Anion gap: 10 (ref 5–15)
BUN: 26 mg/dL — ABNORMAL HIGH (ref 8–23)
CO2: 27 mmol/L (ref 22–32)
Calcium: 9.6 mg/dL (ref 8.9–10.3)
Chloride: 99 mmol/L (ref 98–111)
Creatinine, Ser: 1.75 mg/dL — ABNORMAL HIGH (ref 0.61–1.24)
GFR, Estimated: 41 mL/min — ABNORMAL LOW (ref >60)
Glucose, Bld: 124 mg/dL — ABNORMAL HIGH (ref 70–99)
Potassium: 4.2 mmol/L (ref 3.5–5.1)
Sodium: 136 mmol/L (ref 135–145)

## 2024-04-29 LAB — MAGNESIUM: Magnesium: 1.9 mg/dL (ref 1.7–2.4)

## 2024-04-29 LAB — GLUCOSE, CAPILLARY
Glucose-Capillary: 130 mg/dL — ABNORMAL HIGH (ref 70–99)
Glucose-Capillary: 181 mg/dL — ABNORMAL HIGH (ref 70–99)

## 2024-04-29 NOTE — Care Management Important Message (Signed)
 Important Message  Patient Details  Name: [name] MRN: [id number] Date of Birth: May 17, 1951   Important Message Given:  Yes - Medicare IM     Amoni Scallan 04/29/2024, 3:10 PM

## 2024-04-29 NOTE — TOC Progression Note (Addendum)
 Transition of Care [facility]) - Progression Note    Patient Details  Name: [name] MRN: [id number] Date of Birth: [birth year]  Transition of Care [facility]) CM/SW Contact  Inocente GORMAN Kindle, LCSW Phone Number: 04/29/2024, 7:52 AM  Clinical Narrative:    7:52 AM Awaiting expedited SNF appeal decision.   CSW updated patient's spouse.   11:04 AM-CSW received call from [facility] appeals department, Amy. She stated the denial decision was upheld and the case has gone to IRE review. They have 72 hours to make the determination and will mail the decision. CSW can contact appeals department to check the status in a day or two at [phone].  5pm-CSW faxed updated therapy note to the [facility] appeals team [phone]) in hopes that the assists case.    Expected Discharge Plan: Skilled Nursing Facility Barriers to Discharge: Continued Medical Work up               Expected Discharge Plan and Services In-house Referral: Clinical Social Work     Living arrangements for the past 2 months: Single Family Home Expected Discharge Date: 04/26/24                                     Social Drivers of Health (SDOH) Interventions SDOH Screenings   Food Insecurity: No Food Insecurity (04/24/2024)  Housing: Low Risk  (04/24/2024)  Transportation Needs: Unmet Transportation Needs (04/24/2024)  Utilities: Not At Risk (04/24/2024)  Depression (PHQ2-9): Low Risk  (04/12/2023)  Financial Resource Strain: Low Risk  (09/28/2023)   Received from [facility]  Social Connections: Moderately Integrated (04/24/2024)  Tobacco Use: Low Risk  (04/23/2024)    Readmission Risk Interventions    04/10/2024   12:44 PM  Readmission Risk Prevention Plan  Post Dischage Appt Complete  Medication Screening Complete  Transportation Screening Complete

## 2024-04-29 NOTE — Progress Notes (Signed)
 [facility]                                                                                                                                                                         Patient Demographics  [name], is a 73 y.o. male  RDW:[id number]  FMW:[id number]  DOB - 1950/07/12  Admit date - 04/23/2024  Admitting Physician Wayne LOISE Hurst, DO  Outpatient Primary MD for the patient is Wayne Bene, MD  LOS - 5   Chief Complaint  Patient presents with   Altered Mental Status        Assessment & Plan    Patient seen briefly today, he was discharged yesterday to SNF however his placement was denied by his insurance, he is symptom-free except for generalized weakness, await appropriate placement.  Updated wife again on 04/29/2024.  Medications  Scheduled Meds:  aspirin EC  81 mg Oral Once per day on Monday Thursday   carbidopa-levodopa  1 tablet Oral TID   carvedilol  3.125 mg Oral BID WC   cholecalciferol  5,000 Units Oral Daily   donepezil   5 mg Oral q AM   enoxaparin (LOVENOX) injection  40 mg Subcutaneous Q24H   ezetimibe   10 mg Oral QHS   memantine   28 mg Oral Daily   polyethylene glycol  17 g Oral Daily   tamsulosin   0.4 mg Oral Daily   Continuous Infusions: PRN Meds:.acetaminophen , allopurinol , hydrALAZINE, melatonin    Time Spent in minutes   10 minutes   [name] M.D on 04/29/2024 at 7:15 AM  Between 7am to 7pm - Pager - [phone]  After 7pm go to [email or web address] - password [email or web address]  And look for the night coverage person covering for me after hours  [facility] Office  [phone]    Subjective:   [name] today has, No headache, No chest pain, No abdominal pain - No Nausea, No new weakness tingling or numbness, No Cough - SOB.    Objective:   Vitals:   04/28/24 1557 04/28/24 2053 04/29/24 0016 04/29/24 0433  BP: 133/74 132/71 128/77 138/83   Pulse: 73 81 65 74  Resp:  18 18 18   Temp: 98.2 F (36.8 C) (!) 97.3 F (36.3 C) (!) 97.4 F (36.3 C) 97.7 F (36.5 C)  TempSrc: Oral Oral Oral Oral  SpO2: 97% 98% 98% 93%  Weight:      Height:        Wt Readings from Last 3 Encounters:  04/23/24 66.7 kg  04/10/24 71.6 kg  03/18/24 72.6 kg     Intake/Output Summary (Last 24 hours) at 04/29/2024 0715 Last data filed at 04/29/2024 0435 Gross per 24 hour  Intake 240 ml  Output 600  ml  Net -360 ml    Exam  Awake Alert, No new F.N deficits, Normal affect [place].AT,PERRAL Supple Neck, No JVD,   Symmetrical Chest wall movement, Good air movement bilaterally, CTAB RRR,No Gallops, Rubs or new Murmurs,  +ve B.Sounds, Abd Soft, No tenderness,   No Cyanosis, Clubbing or edema   Data Review

## 2024-04-29 NOTE — Progress Notes (Signed)
 Physical Therapy Treatment Patient Details Name: [name] MRN: [id number] DOB: January 15, 1951 Today's Date: 04/29/2024   History of Present Illness [name] is a 73 y.o. male presented 04/23/24 from [facility] to ED with worsening tremors, increased confusion, right eye looking different. MRI and CT -negative for acute event. 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 He was able to transfer sit <> stand 8x and ambulate up to ~100 ft with a RW and min-modA today! He remains at risk for falls due to his tremulous movements and noted deficits in generalized strength, balance, power, and endurance though. He could greatly benefit from inpatient rehab to maximize his independence and safety with functional mobility. Will continue to follow acutely.    If plan is discharge home, recommend the following: A lot of help with bathing/dressing/bathroom;Assist for transportation;Help with stairs or ramp for entrance;Direct supervision/assist for medications management;Direct supervision/assist for financial management;Assistance with cooking/housework;Two people to help with walking and/or transfers;Supervision due to cognitive status   Can travel by private vehicle     No  Equipment Recommendations  Rolling walker (2 wheels);BSC/3in1;Wheelchair (measurements PT);Wheelchair cushion (measurements PT);Hospital bed (pending progress)    Recommendations for Other Services       Precautions / Restrictions Precautions Precautions: Fall;Back Precaution Booklet Issued: No (verbally reviewed) Recall of Precautions/Restrictions: Impaired Precaution/Restrictions Comments: reviewed back precautions Required Braces or Orthoses: Other Brace;Spinal Brace Spinal Brace: Thoracolumbosacral orthotic;Applied in sitting  position Restrictions Weight Bearing Restrictions Per Provider Order: No     Mobility  Bed Mobility               General bed mobility comments: Pt sitting up in recliner upon arrival and end of session    Transfers Overall transfer level: Needs assistance Equipment used: Rolling walker (2 wheels) Transfers: Sit to/from Stand Sit to Stand: Min assist, Mod assist           General transfer comment: Pt needed repeated reminders to place one hand on the recliner arm rest and one on the RW to push up to stand. ModA initially but quickly progressing to minA with subsequent reps, x8 reps total    Ambulation/Gait Ambulation/Gait assistance: Mod assist, Min assist Gait Distance (Feet): 100 Feet Assistive device: Rolling walker (2 wheels) Gait Pattern/deviations: Decreased stride length, Trunk flexed, Step-through pattern, Shuffle Gait velocity: reduced Gait velocity interpretation: <1.8 ft/sec, indicate of risk for recurrent falls   General Gait Details: Pt ambulates with short, shuffling, tremulous steps while maintaining an inferior gaze and flexed posture. Cues needed for upright posture. Min-modA for balance throughout   Stairs             Wheelchair Mobility     Tilt Bed    Modified Rankin (Stroke Patients Only)       Balance Overall balance assessment: Needs assistance Sitting-balance support: Feet supported Sitting balance-Leahy Scale: Fair     Standing balance support: Bilateral upper extremity supported, Reliant on assistive device for balance, During functional activity Standing balance-Leahy Scale: Poor Standing balance comment: reliant on RW and min-modA                            Communication Communication Communication: Impaired Factors Affecting Communication: Hearing impaired  Cognition Arousal: Alert Behavior During Therapy: [facility] for tasks assessed/performed  PT - Cognitive impairments: Problem solving, Sequencing,  History of cognitive impairments, Memory                       PT - Cognition Comments: Wife reports hx of dementia and memory loss at baseline. Reduced anxiety levels this date. He needs repeated reminders for proper hand placement during transfers, displaying memory and problem-solving deficits Following commands: Impaired Following commands impaired: Follows one step commands with increased time, Only follows one step commands consistently, Follows multi-step commands inconsistently, Follows multi-step commands with increased time    Cueing Cueing Techniques: Verbal cues, Tactile cues  Exercises Other Exercises Other Exercises: x7 serial sit <> stand from recliner with minA    General Comments        Pertinent Vitals/Pain Pain Assessment Pain Assessment: Faces Faces Pain Scale: Hurts a little bit Pain Location: generalized with mobility Pain Descriptors / Indicators: Discomfort, Grimacing, Guarding Pain Intervention(s): Monitored during session, Limited activity within patient's tolerance, Repositioned    Home Living                          Prior Function            PT Goals (current goals can now be found in the care plan section) Acute Rehab PT Goals Patient Stated Goal: to go back to rehab PT Goal Formulation: With patient/family Time For Goal Achievement: 05/08/24 Potential to Achieve Goals: Good Progress towards PT goals: Progressing toward goals    Frequency    Min 2X/week      PT Plan      Co-evaluation              AM-PAC PT 6 Clicks Mobility   Outcome Measure  Help needed turning from your back to your side while in a flat bed without using bedrails?: A Lot Help needed moving from lying on your back to sitting on the side of a flat bed without using bedrails?: A Lot Help needed moving to and from a bed to a chair (including a wheelchair)?: A Lot Help needed standing up from a chair using your arms (e.g., wheelchair or  bedside chair)?: A Lot Help needed to walk in hospital room?: A Lot Help needed climbing 3-5 steps with a railing? : Total 6 Click Score: 11    End of Session Equipment Utilized During Treatment: Gait belt;Back brace Activity Tolerance: Patient tolerated treatment well Patient left: with call bell/phone within reach;in chair;with chair alarm set;with family/visitor present Nurse Communication: Mobility status;Other (comment) (BSC for bathroom needs) PT Visit Diagnosis: Other abnormalities of gait and mobility (R26.89);Muscle weakness (generalized) (M62.81);Other symptoms and signs involving the nervous system (R29.898);Unsteadiness on feet (R26.81);Difficulty in walking, not elsewhere classified (R26.2)     Time: 8396-8375 PT Time Calculation (min) (ACUTE ONLY): 21 min  Charges:    $Therapeutic Activity: 8-22 mins PT General Charges $$ ACUTE PT VISIT: 1 Visit                     Theo Ferretti, PT, DPT Acute Rehabilitation Services  Office: [phone]    Theo CHRISTELLA Ferretti 04/29/2024, 4:41 PM

## 2024-04-30 DIAGNOSIS — R251 Tremor, unspecified: Secondary | ICD-10-CM | POA: Diagnosis not present

## 2024-04-30 LAB — CREATININE, SERUM
Creatinine, Ser: 1.87 mg/dL — ABNORMAL HIGH (ref 0.61–1.24)
GFR, Estimated: 37 mL/min — ABNORMAL LOW (ref >60)

## 2024-04-30 LAB — GLUCOSE, CAPILLARY
Glucose-Capillary: 158 mg/dL — ABNORMAL HIGH (ref 70–99)
Glucose-Capillary: 113 mg/dL — ABNORMAL HIGH (ref 70–99)

## 2024-04-30 NOTE — Progress Notes (Signed)
   04/30/24 1318  Mobility  Activity Ambulated with assistance  Level of Assistance Minimal assist, patient does 75% or more  Assistive Device Front wheel walker  Distance Ambulated (ft) 250 ft  Activity Response Tolerated fair  Mobility Referral Yes  Mobility visit 1 Mobility  Mobility Specialist Start Time (ACUTE ONLY) 1318  Mobility Specialist Stop Time (ACUTE ONLY) 1338  Mobility Specialist Time Calculation (min) (ACUTE ONLY) 20 min   Mobility Specialist: Progress Note  Pt agreeable to mobility session - received in bed. Pt was asymptomatic throughout session with no complaints. Returned to chair with all needs met - call bell within reach. Chair alarm on. Wife present.   Additional comments: Pt seen for additional visit. Requesting transfer, c/o discomfort/ weakness in his legs.   Virgle Boards, BS Mobility Specialist Please contact via SecureChat or  Rehab office at [phone].

## 2024-04-30 NOTE — Progress Notes (Signed)
 PROGRESS NOTE                                                                                                                                                                                                             Patient Demographics:    [name], is a 73 y.o. male, DOB - Nov 19, 1950, FMW:[id number]  Outpatient Primary MD for the patient is Aletha Bene, MD    LOS - 6  Admit date - 04/23/2024    Chief Complaint  Patient presents with   Altered Mental Status       Brief Narrative (HPI from H&P)    73 y.o. male with medical history significant for type 2 diabetes, peripheral neuropathy, hyperlipidemia, hypertension, chronic tremors, mild cognitive impairment, parkinsonian features, who presents to the ER due to worsening tremors for the past 2 days.  Associated with some confusion.  His wife was concerned about possible right facial droop.   In the ER, the patient was seen by neurology/stroke team.  CT head without contrast was nonacute.  MRI brain was negative for acute CVA.  EDP requesting admission for management of tremors.  Admitted by [facility], hospitalist service.   Subjective:   Patient in bed, appears comfortable, denies any headache, no fever, no chest pain or pressure, no shortness of breath , no abdominal pain. No focal weakness.   Assessment  & Plan :    Acute metabolic encephalopathy, POA:  CT head followed by MRI brain unremarkable.  Patient is fully alert and oriented and back to baseline now.  Minimize narcotics and muscle relaxants, close to baseline but overall weak and deconditioned, he was discharged to SNF but insurance has declined placement, we await safe disposition.  TOC working  AKI on CKD 3B, suspect prerenal - Baseline creatinine appears to be between 1.5-1.8 with GFR 47, Presented with creatinine of 2.0 which has improved to 1.85.  Continue IV fluids, avoid nephrotoxic agent.  Creatinine  improved, follow intermittently   Hypertension: Blood pressure slightly on the high side will place on low-dose Coreg and monitor continue to hold ACE and ARB due to recent AKI and dehydration, as needed hydralazine.   Chronic tremors, worsening: Tremors first noted maybe about 2 years ago, they were rare and not really that bothersome. They worsened about 3 weeks ago while he was at [facility] but  significantly improved with sinemet and reducing gabapentin  and opiod. He was discharged to rehab where he was doing better. Over the last week, would notice brief tremors/jerking that would wake him up at night. Also poor po intake at rehab and not drinking enough water.  Seen by neurology, holding gabapentin  per their recommendations and continuing current dose of Sinemet.  Neurology postdischarge will follow-up with Dr. Evonnie, patient's personal neurologist has left the practice according to the wife.   Severe hypophosphatemia: Replaced and stable   BPH  Resume home tamsulosin    Constipation: On bowel regimen and resolved  Generalized weakness PT OT evaluation Qualifies for SNF but denied by insurance.           Condition - Extremely Guarded  Family Communication  : Updated wife in detail on 04/25/2024  Code Status : Full code  Consults  : Neurology  PUD Prophylaxis :    Procedures  :     MRI - 1. No acute infarction. 2. Age-related atrophy and mild-to-moderate cerebral white matter disease.      Disposition Plan  :    Status is: Inpatient  DVT Prophylaxis  :    enoxaparin (LOVENOX) injection 40 mg Start: 04/25/24 2200    Lab Results  Component Value Date   PLT 264 04/29/2024    Diet :  Diet Order             Diet heart healthy/carb modified Room service appropriate? Yes; Fluid consistency: Thin  Diet effective now                    Inpatient Medications  Scheduled Meds:  aspirin EC  81 mg Oral Once per day on Monday Thursday   carbidopa-levodopa  1 tablet Oral  TID   carvedilol  3.125 mg Oral BID WC   cholecalciferol  5,000 Units Oral Daily   donepezil   5 mg Oral q AM   enoxaparin (LOVENOX) injection  40 mg Subcutaneous Q24H   ezetimibe   10 mg Oral QHS   memantine   28 mg Oral Daily   polyethylene glycol  17 g Oral Daily   tamsulosin   0.4 mg Oral Daily   Continuous Infusions: PRN Meds:.acetaminophen , allopurinol , hydrALAZINE, melatonin  Antibiotics  :    Anti-infectives (From admission, onward)    None         Objective:   Vitals:   04/29/24 2029 04/30/24 0047 04/30/24 0405 04/30/24 0749  BP: 130/64 132/78 (!) 141/84 (!) 139/94  Pulse: 78 78 80   Resp: 18 18 18    Temp: 97.8 F (36.6 C) 97.7 F (36.5 C) 97.7 F (36.5 C) 98 F (36.7 C)  TempSrc: Oral Oral Oral Oral  SpO2: 98%  97%   Weight:      Height:        Wt Readings from Last 3 Encounters:  04/23/24 66.7 kg  04/10/24 71.6 kg  03/18/24 72.6 kg     Intake/Output Summary (Last 24 hours) at 04/30/2024 0934 Last data filed at 04/29/2024 2125 Gross per 24 hour  Intake 240 ml  Output 500 ml  Net -260 ml     Physical Exam  Awake Alert, No new F.N deficits, Normal affect [place].AT,PERRAL Supple Neck, No JVD,   Symmetrical Chest wall movement, Good air movement bilaterally, CTAB RRR,No Gallops,Rubs or new Murmurs,  +ve B.Sounds, Abd Soft, No tenderness,   No Cyanosis, Clubbing or edema      Data Review:    Recent Labs  Lab 04/23/24 1534 04/24/24 0057 04/25/24 0302 04/29/24 0317  WBC 9.1 10.8* 7.3 7.0  HGB 16.5 16.5 15.1 16.7  HCT 50.2 48.4 44.7 49.3  PLT 261 276 248 264  MCV 84.5 81.8 82.5 82.9  MCH 27.8 27.9 27.9 28.1  MCHC 32.9 34.1 33.8 33.9  RDW 13.0 12.8 13.2 12.6  LYMPHSABS  --   --  1.6 1.7  MONOABS  --   --  0.7 0.6  EOSABS  --   --  0.4 0.5  BASOSABS  --   --  0.1 0.1    Recent Labs  Lab 04/23/24 1534 04/24/24 0057 04/25/24 0302 04/29/24 0317 04/30/24 0433  NA 138 139 138 136  --   K 3.7 3.6 4.7 4.2  --   CL 102 104 104 99  --    CO2 26 16* 22 27  --   ANIONGAP 10 19* 12 10  --   GLUCOSE 140* 134* 138* 124*  --   BUN 23 22 14  26*  --   CREATININE 2.00* 1.85* 1.73* 1.75* 1.87*  AST 21  --  20  --   --   ALT 6  --  6  --   --   ALKPHOS 115  --  105  --   --   BILITOT 0.8  --  0.9  --   --   ALBUMIN 4.1  --  3.7  --   --   MG  --  1.9  --  1.9  --   PHOS  --  <1.0* 6.8*  --   --   CALCIUM  9.4 9.3 8.9 9.6  --       Recent Labs  Lab 04/23/24 1534 04/24/24 0057 04/25/24 0302 04/29/24 0317  MG  --  1.9  --  1.9  CALCIUM  9.4 9.3 8.9 9.6    --------------------------------------------------------------------------------------------------------------- Lab Results  Component Value Date   CHOL 161 12/06/2023   HDL 45 12/06/2023   LDLCALC 94 12/06/2023   TRIG 127 12/06/2023   CHOLHDL 3.6 12/06/2023    Lab Results  Component Value Date   HGBA1C 6.3 (H) 12/06/2023   No results for input(s): TSH, T4TOTAL, FREET4, T3FREE, THYROIDAB in the last 72 hours. No results for input(s): VITAMINB12, FOLATE, FERRITIN, TIBC, IRON, RETICCTPCT in the last 72 hours. ------------------------------------------------------------------------------------------------------------------ Cardiac Enzymes No results for input(s): CKMB, TROPONINI, MYOGLOBIN in the last 168 hours.  Invalid input(s): CK  Micro Results No results found for this or any previous visit (from the past 240 hours).  Radiology Report  No results found.    Signature  -   Lavada Stank M.D on 04/30/2024 at 9:34 AM   -  To page go to [email or web address]

## 2024-05-01 DIAGNOSIS — R4182 Altered mental status, unspecified: Secondary | ICD-10-CM | POA: Diagnosis not present

## 2024-05-01 LAB — PHOSPHORUS: Phosphorus: 2.8 mg/dL (ref 2.5–4.6)

## 2024-05-01 LAB — BASIC METABOLIC PANEL WITH GFR
Anion gap: 15 (ref 5–15)
BUN: 22 mg/dL (ref 8–23)
CO2: 21 mmol/L — ABNORMAL LOW (ref 22–32)
Calcium: 9.2 mg/dL (ref 8.9–10.3)
Chloride: 103 mmol/L (ref 98–111)
Creatinine, Ser: 1.82 mg/dL — ABNORMAL HIGH (ref 0.61–1.24)
GFR, Estimated: 39 mL/min — ABNORMAL LOW (ref >60)
Glucose, Bld: 169 mg/dL — ABNORMAL HIGH (ref 70–99)
Potassium: 3.8 mmol/L (ref 3.5–5.1)
Sodium: 139 mmol/L (ref 135–145)

## 2024-05-01 LAB — GLUCOSE, CAPILLARY
Glucose-Capillary: 130 mg/dL — ABNORMAL HIGH (ref 70–99)
Glucose-Capillary: 140 mg/dL — ABNORMAL HIGH (ref 70–99)

## 2024-05-01 MED ORDER — DIPHENHYDRAMINE HCL 25 MG PO CAPS
25.0000 mg | ORAL_CAPSULE | Freq: Four times a day (QID) | ORAL | Status: DC | PRN
Start: 2024-05-01 — End: 2024-05-03
  Administered 2024-05-01 (×2): 25 mg via ORAL
  Filled 2024-05-01 (×2): qty 1

## 2024-05-01 NOTE — Progress Notes (Signed)
   05/01/24 1432  Mobility  Activity Ambulated with assistance  Level of Assistance Contact guard assist, steadying assist  Assistive Device Front wheel walker  Distance Ambulated (ft) 500 ft  Activity Response Tolerated fair  Mobility Referral Yes  Mobility visit 1 Mobility  Mobility Specialist Start Time (ACUTE ONLY) 1432  Mobility Specialist Stop Time (ACUTE ONLY) 1500  Mobility Specialist Time Calculation (min) (ACUTE ONLY) 28 min   Mobility Specialist: Progress Note  Pt agreeable to mobility session - received in chair. C/o back pain rated 5/10, also feeling winded after walking eased with rest, used pulse ox, reading as not good.  Returned to bed with all needs met - call bell within reach. Bed alarm on.   Additional comments: Pt used BR, small soft BM, required assistance with pericare.  Virgle Boards, BS Mobility Specialist Please contact via SecureChat or  Rehab office at [phone].

## 2024-05-01 NOTE — Progress Notes (Signed)
 Physical Therapy Treatment Patient Details Name: [name] MRN: [id number] DOB: 08/13/1950 Today's Date: 05/01/2024   History of Present Illness [name] is a 73 y.o. male presented 04/23/24 from [facility] to ED with worsening tremors, increased confusion, right eye looking different. MRI and CT -negative for acute event. 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 He did display instability when stepping posteriorly with a RW, needing light minA for balance though. Attempted to progress pt away from needing an AD to ambulate, but pt displayed instability with trunk swaying while ambulating without UE support, needing minA to maintain his balance. Educated pt and his daughter on his balance deficits placing him at risk for falls and recs for frequent assistance if he discharges home along with recs for pt to use a RW for standing mobility at this time. He still would greatly benefit from inpatient rehab to maximize his independence and safety with mobility prior to d/c home since his wife works. His daughter did report she could be available to assist often though. If they can arrange frequent assistance at home at d/c then d/c home with HHPT services could be possible. Will continue to follow acutely.    If plan is discharge home, recommend the following: Assist for transportation;Help with stairs or ramp for entrance;Direct supervision/assist for medications management;Direct supervision/assist for financial management;Assistance with cooking/housework;Supervision due to cognitive status;A little help with walking and/or transfers;A little help with bathing/dressing/bathroom   Can travel by private vehicle     No  Equipment  Recommendations  Rolling walker (2 wheels);BSC/3in1    Recommendations for Other Services       Precautions / Restrictions Precautions Precautions: Fall;Back Precaution Booklet Issued: No (verbally reviewed) Recall of Precautions/Restrictions: Impaired Precaution/Restrictions Comments: reviewed back precautions Required Braces or Orthoses: Other Brace;Spinal Brace Spinal Brace: Thoracolumbosacral orthotic;Applied in sitting position Restrictions Weight Bearing Restrictions Per Provider Order: No     Mobility  Bed Mobility               General bed mobility comments: Pt sitting up in recliner upon arrival and end of session    Transfers Overall transfer level: Needs assistance Equipment used: Rolling walker (2 wheels) Transfers: Sit to/from Stand Sit to Stand: Contact guard assist           General transfer comment: CGA for safety standing from recliner, pt rocking trunk to gain momentum to stand. Cues needed to back up until legs were touching the chair and reach back prior to sitting as pt sat without doing either, CGA for safety    Ambulation/Gait Ambulation/Gait assistance: Contact guard assist, Min assist Gait Distance (Feet): 430 Feet Assistive device: Rolling walker (2 wheels), None Gait Pattern/deviations: Decreased stride length, Trunk flexed, Step-through pattern, Shuffle Gait velocity: reduced Gait velocity interpretation: <1.8 ft/sec, indicate of risk for recurrent falls   General Gait Details: Pt ambulates with short, shuffling, intermittent tremulous steps (less tremulous this date) while maintaining an inferior gaze and flexed posture. Cues needed for upright posture and to look superiorly/anteriorly. CGA with no LOB when ambulating anteriorly with a RW, light minA for balance when stepping posteriorly with RW. Ambulated ~250 ft of the distance without UE support with light minA for balance throughout,  intermittent swaying noted, especially when  turning or nodding his head. Educated pt that he currently still needs to use a RW for his [employer] Bed    Modified Rankin (Stroke Patients Only)       Balance Overall balance assessment: Needs assistance Sitting-balance support: Feet supported Sitting balance-Leahy Scale: Fair     Standing balance support: Bilateral upper extremity supported, During functional activity, No upper extremity supported Standing balance-Leahy Scale: Fair Standing balance comment: Able to stand statically without UE support but displays trunk sway when ambulating without UE support, needing minA for balance. CGA to ambulate with RW                            Communication Communication Communication: Impaired Factors Affecting Communication: Hearing impaired  Cognition Arousal: Alert Behavior During Therapy: WFL for tasks assessed/performed   PT - Cognitive impairments: Problem solving, History of cognitive impairments, Safety/Judgement, Memory                       PT - Cognition Comments: Wife reports hx of dementia and memory loss at baseline. Decreased insight into his balance deficits placing him at risk for falls, wanting to ambulate without RW at times but still needing it for safety Following commands: Impaired Following commands impaired: Follows multi-step commands with increased time    Cueing Cueing Techniques: Verbal cues, Tactile cues  Exercises      General Comments General comments (skin integrity, edema, etc.): Educated pt and daughter on pt's need for frequent assistance and recs to still use a RW at this time due to noted balance deficits.      Pertinent Vitals/Pain Pain Assessment Pain Assessment: Faces Faces Pain Scale: Hurts a little bit Pain Location: generalized with mobility Pain Descriptors / Indicators: Discomfort, Grimacing, Guarding Pain Intervention(s): Limited activity within  patient's tolerance, Monitored during session, Repositioned    Home Living                          Prior Function            PT Goals (current goals can now be found in the care plan section) Acute Rehab PT Goals Patient Stated Goal: to go back to rehab PT Goal Formulation: With patient/family Time For Goal Achievement: 05/08/24 Potential to Achieve Goals: Good Progress towards PT goals: Progressing toward goals    Frequency    Min 2X/week      PT Plan      Co-evaluation              AM-PAC PT 6 Clicks Mobility   Outcome Measure  Help needed turning from your back to your side while in a flat bed without using bedrails?: A Lot Help needed moving from lying on your back to sitting on the side of a flat bed without using bedrails?: A Lot Help needed moving to and from a bed to a chair (including a wheelchair)?: A Little Help needed standing up from a chair using your arms (e.g., wheelchair or bedside chair)?: A Little Help needed to walk in hospital room?: A Little Help needed climbing 3-5 steps with a railing? : Total 6 Click Score: 14    End of Session Equipment Utilized During Treatment: Back brace Activity Tolerance: Patient  tolerated treatment well Patient left: with call bell/phone within reach;in chair;with chair alarm set;with family/visitor present;with nursing/sitter in room Nurse Communication: Mobility status PT Visit Diagnosis: Other abnormalities of gait and mobility (R26.89);Muscle weakness (generalized) (M62.81);Other symptoms and signs involving the nervous system (R29.898);Unsteadiness on feet (R26.81);Difficulty in walking, not elsewhere classified (R26.2)     Time: 1206-1224 PT Time Calculation (min) (ACUTE ONLY): 18 min  Charges:    $Gait Training: 8-22 mins PT General Charges $$ ACUTE PT VISIT: 1 Visit                     Theo Ferretti, PT, DPT Acute Rehabilitation Services  Office: [phone]    Theo CHRISTELLA Ferretti 05/01/2024, 12:34 PM

## 2024-05-01 NOTE — TOC Progression Note (Signed)
 Transition of Care [facility]) - Progression Note    Patient Details  Name: [name] MRN: [id number] Date of Birth: May 07, 1951  [facility]) CM/SW Contact  Inocente GORMAN Kindle, LCSW Phone Number: 05/01/2024, 12:13 PM  Clinical Narrative:    12:13 PM-continuing to await response from appeal. Updated patient's spouse who was able to speak with Financial Counseling regarding Medicaid. Patient has too many assets to qualify for Medicaid though spouse is going to speak with a clinical research associate.   CSW discussed home health if the appeal comes back denied. She reported patient was walking down the hall with a walker so is making progress.   Spouse asked about if he eventually needs a hospital bed and CSW directed her to go through patient's PCP for any DME orders once he is home if that is ever needed.    Expected Discharge Plan: Skilled Nursing Facility Barriers to Discharge: Continued Medical Work up               Expected Discharge Plan and Services In-house Referral: Clinical Social Work     Living arrangements for the past 2 months: Single Family Home Expected Discharge Date: 04/26/24                                     Social Drivers of Health (SDOH) Interventions SDOH Screenings   Food Insecurity: No Food Insecurity (04/24/2024)  Housing: Low Risk  (04/24/2024)  Transportation Needs: Unmet Transportation Needs (04/24/2024)  Utilities: Not At Risk (04/24/2024)  Depression (PHQ2-9): Low Risk  (04/12/2023)  Financial Resource Strain: Low Risk  (09/28/2023)   Received from [facility]  Social Connections: Moderately Integrated (04/24/2024)  Tobacco Use: Low Risk  (04/23/2024)    Readmission Risk Interventions    04/10/2024   12:44 PM  Readmission Risk Prevention Plan  Post Dischage Appt Complete  Medication Screening Complete  Transportation Screening Complete

## 2024-05-01 NOTE — Progress Notes (Signed)
 PROGRESS NOTE                                                                                                                                                                                                             Patient Demographics:    [name], is a 73 y.o. male, DOB - 1950-09-02, FMW:[id number]  Outpatient Primary MD for the patient is Aletha Bene, MD    LOS - 7  Admit date - 04/23/2024    Chief Complaint  Patient presents with   Altered Mental Status       Brief Narrative (HPI from H&P)     73 y.o. male with medical history significant for type 2 diabetes, peripheral neuropathy, hyperlipidemia, hypertension, chronic tremors, mild cognitive impairment, parkinsonian features, who presents to the ER due to worsening tremors for the past 2 days.  Associated with some confusion.  His wife was concerned about possible right facial droop.   In the ER, the patient was seen by neurology/stroke team.  CT head without contrast was nonacute.  MRI brain was negative for acute CVA.  EDP requesting admission for management of tremors.  Admitted by [facility], hospitalist service.   Subjective:   Patient in bed, appears comfortable, denies headache, fever, chest pain or shortness of breath    Assessment  & Plan :    Acute metabolic encephalopathy, POA:   -CT head followed by MRI brain unremarkable.  Patient is fully alert and oriented and back to baseline now.  Minimize narcotics and muscle relaxants, close to baseline but overall weak and deconditioned, he was discharged to SNF but insurance has declined placement, we await safe disposition.  TOC working  AKI on CKD 3B, suspect prerenal  - Baseline creatinine appears to be between 1.5-1.8 with GFR 47, Presented with creatinine of 2.0 which has improved to 1.85.  Continue IV fluids, avoid nephrotoxic agent.  Creatinine improved, follow intermittently   Hypertension:  -Blood  pressure slightly on the high side will place on low-dose Coreg and monitor continue to hold ACE and ARB due to recent AKI and dehydration, as needed hydralazine.   Chronic tremors, worsening:  -Tremors first noted maybe about 2 years ago, they were rare and not really that bothersome. They worsened about 3 weeks ago while he was at [facility] but significantly improved with sinemet and reducing  gabapentin  and opiod. He was discharged to rehab where he was doing better. Over the last week, would notice brief tremors/jerking that would wake him up at night. Also poor po intake at rehab and not drinking enough water.  Seen by neurology, holding gabapentin  per their recommendations and continuing current dose of Sinemet.  Neurology postdischarge will follow-up with Dr. Evonnie, patient's personal neurologist has left the practice according to the wife.   Severe hypophosphatemia: Replaced and stable   BPH  Resume home tamsulosin    Constipation: On bowel regimen and resolved  Generalized weakness PT OT evaluation Qualifies for SNF but denied by insurance.            Family Communication  : None at bedside  Code Status : Full code  Consults  : Neurology  PUD Prophylaxis :    Procedures  :     MRI - 1. No acute infarction. 2. Age-related atrophy and mild-to-moderate cerebral white matter disease.      Disposition Plan  :    Status is: Inpatient  DVT Prophylaxis  :    enoxaparin (LOVENOX) injection 40 mg Start: 04/25/24 2200    Lab Results  Component Value Date   PLT 264 04/29/2024    Diet :  Diet Order             Diet heart healthy/carb modified Room service appropriate? Yes; Fluid consistency: Thin  Diet effective now                    Inpatient Medications  Scheduled Meds:  aspirin EC  81 mg Oral Once per day on Monday Thursday   carbidopa-levodopa  1 tablet Oral TID   carvedilol  3.125 mg Oral BID WC   cholecalciferol  5,000 Units Oral Daily   donepezil   5 mg  Oral q AM   enoxaparin (LOVENOX) injection  40 mg Subcutaneous Q24H   ezetimibe   10 mg Oral QHS   memantine   28 mg Oral Daily   polyethylene glycol  17 g Oral Daily   tamsulosin   0.4 mg Oral Daily   Continuous Infusions: PRN Meds:.acetaminophen , allopurinol , diphenhydrAMINE, hydrALAZINE, melatonin  Antibiotics  :    Anti-infectives (From admission, onward)    None         Objective:   Vitals:   05/01/24 0020 05/01/24 0417 05/01/24 0802 05/01/24 1059  BP: 125/71 130/83  130/81  Pulse: 81 81    Resp: 18 18    Temp: 98.5 F (36.9 C) 98.5 F (36.9 C) 98.4 F (36.9 C) 98.6 F (37 C)  TempSrc: Oral Oral Oral Oral  SpO2:      Weight:      Height:        Wt Readings from Last 3 Encounters:  04/23/24 66.7 kg  04/10/24 71.6 kg  03/18/24 72.6 kg    No intake or output data in the 24 hours ending 05/01/24 1137    Physical Exam  Awake, alert, no apparent distress, frail, deconditioned  Good air entry bilaterally  Regular rate and rhythm  Abdomen soft  Extremities with no edema    Data Review:    Recent Labs  Lab 04/25/24 0302 04/29/24 0317  WBC 7.3 7.0  HGB 15.1 16.7  HCT 44.7 49.3  PLT 248 264  MCV 82.5 82.9  MCH 27.9 28.1  MCHC 33.8 33.9  RDW 13.2 12.6  LYMPHSABS 1.6 1.7  MONOABS 0.7 0.6  EOSABS 0.4 0.5  BASOSABS 0.1 0.1  Recent Labs  Lab 04/25/24 0302 04/29/24 0317 04/30/24 0433 05/01/24 0253  NA 138 136  --  139  K 4.7 4.2  --  3.8  CL 104 99  --  103  CO2 22 27  --  21*  ANIONGAP 12 10  --  15  GLUCOSE 138* 124*  --  169*  BUN 14 26*  --  22  CREATININE 1.73* 1.75* 1.87* 1.82*  AST 20  --   --   --   ALT 6  --   --   --   ALKPHOS 105  --   --   --   BILITOT 0.9  --   --   --   ALBUMIN 3.7  --   --   --   MG  --  1.9  --   --   PHOS 6.8*  --   --  2.8  CALCIUM  8.9 9.6  --  9.2      Recent Labs  Lab 04/25/24 0302 04/29/24 0317 05/01/24 0253  MG  --  1.9  --   CALCIUM  8.9 9.6 9.2     --------------------------------------------------------------------------------------------------------------- Lab Results  Component Value Date   CHOL 161 12/06/2023   HDL 45 12/06/2023   LDLCALC 94 12/06/2023   TRIG 127 12/06/2023   CHOLHDL 3.6 12/06/2023    Lab Results  Component Value Date   HGBA1C 6.3 (H) 12/06/2023   No results for input(s): TSH, T4TOTAL, FREET4, T3FREE, THYROIDAB in the last 72 hours. No results for input(s): VITAMINB12, FOLATE, FERRITIN, TIBC, IRON, RETICCTPCT in the last 72 hours. ------------------------------------------------------------------------------------------------------------------ Cardiac Enzymes No results for input(s): CKMB, TROPONINI, MYOGLOBIN in the last 168 hours.  Invalid input(s): CK  Micro Results No results found for this or any previous visit (from the past 240 hours).  Radiology Report  No results found.    Signature  -   Brayton Lye M.D on 05/01/2024 at 11:37 AM   -  To page go to [email or web address]

## 2024-05-01 NOTE — Progress Notes (Signed)
   Inpatient Rehab Admissions Coordinator :  Per OT therapy recommendations patient was screened for CIR candidacy by Heron Leavell RN MSN. Noted in appeals with [employer] for SNF. If [employer] will not approve SNF, they also will not approve CIR for a higher level of care.I will not place a Rehab Consult. Recommend other Rehab Venues to be pursued. Please contact me with any questions.  Heron Leavell RN MSN Admissions Coordinator [phone]

## 2024-05-01 NOTE — Plan of Care (Signed)

## 2024-05-01 NOTE — Progress Notes (Signed)
 Occupational Therapy Treatment Patient Details Name: [name] MRN: [id number] DOB: 05-10-1951 Today's Date: 05/01/2024   History of present illness [name] is a 73 y.o. male presented 04/23/24 from [facility] to ED with worsening tremors, increased confusion, right eye looking different. MRI and CT -negative for acute event. 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 Pt is currently completing most LE adls with mod assist and at times min assist and UE adls with min assist outside of donning brace. Pt continues to have cognitive deficits that impact problem solving, initiation, and motor planning. Pt has a very supportive family and would benefit greatly from rehab at more than 3 hours a day.  Feel this pt will have great progression with acute rehab and family can be trained while pt improves. Will continue to see acutely.       If plan is discharge home, recommend the following:  A little help with walking and/or transfers;A little help with bathing/dressing/bathroom;Assistance with cooking/housework;Direct supervision/assist for medications management;Direct supervision/assist for financial management;Assist for transportation;Help with stairs or ramp for entrance;Supervision due to cognitive status   Equipment Recommendations  Other (comment) (tbd)    Recommendations for Other Services      Precautions / Restrictions Precautions Precautions: Fall;Back Precaution Booklet Issued: No (verbally reviewed back precautions. pt with very little carryover.) Recall of Precautions/Restrictions: Impaired Precaution/Restrictions Comments: reviewed back precautions Required Braces or Orthoses: Other Brace;Spinal Brace Spinal Brace: Thoracolumbosacral orthotic;Applied in sitting position Restrictions Weight Bearing Restrictions Per  Provider Order: No       Mobility Bed Mobility Overal bed mobility: Needs Assistance Bed Mobility: Rolling, Sidelying to Sit Rolling: Min assist Sidelying to sit: Min assist       General bed mobility comments: Pt with very parkinson like movements having difficulty follwing commands to roll like a log when getting ujp.    Transfers Overall transfer level: Needs assistance Equipment used: Rolling walker (2 wheels) Transfers: Sit to/from Stand Sit to Stand: Min assist     Step pivot transfers: Min assist     General transfer comment: Pt with very little carryover when remembering to reach back to surface he is going to. Pt holds to walker with one hand when sitting and will not let go.     Balance Overall balance assessment: Needs assistance Sitting-balance support: Feet supported Sitting balance-Leahy Scale: Fair Sitting balance - Comments: leans to R sitting EOB and in chair, minA with intermittent CGA for safety Postural control: Right lateral lean Standing balance support: Bilateral upper extremity supported, Reliant on assistive device for balance, During functional activity Standing balance-Leahy Scale: Poor Standing balance comment: reliant on RW and min-modA                           ADL either performed or assessed with clinical judgement   ADL Overall ADL's : Needs assistance/impaired Eating/Feeding: Set up;Sitting Eating/Feeding Details (indicate cue type and reason): attempted to have pt set up own tray but had difficulty with tremors. pt does not cut food with knife at home; he tears it with hands. Grooming: Wash/dry hands;Wash/dry face;Oral care;Sitting;Cueing for sequencing;Contact guard assist Grooming Details (indicate cue type and reason): attempted to do task at sink. Pt became weak and pale. Pt returned to sitting with BP at 78/63. After sitting for  a few minutes it returned to 125/88 and pt was abl eto participate well.         Upper  Body Dressing : Minimal assistance;Sitting Upper Body Dressing Details (indicate cue type and reason): max a for TLSO brace Lower Body Dressing: Moderate assistance;Sit to/from stand;Cueing for compensatory techniques Lower Body Dressing Details (indicate cue type and reason): PT sat in figure 4 to donn shoes. With shoe horn, feel pt would be able to donn without assist. Toilet Transfer: Minimal assistance;Rolling walker (2 wheels);Regular Toilet;Grab bars Toilet Transfer Details (indicate cue type and reason): walked to bathroom Toileting- Clothing Manipulation and Hygiene: Minimal assistance;Sitting/lateral lean Toileting - Clothing Manipulation Details (indicate cue type and reason): 3:1 put over commode for ease of getting up and have bars to hold to to clean self.     Functional mobility during ADLs: Minimal assistance;Rolling walker (2 wheels) General ADL Comments: Pt with some improvement noted with adls completing most at mod assist level due to tremors, weakness, and tolday pt had low BP at times.    Extremity/Trunk Assessment Upper Extremity Assessment Upper Extremity Assessment: RUE deficits/detail;LUE deficits/detail RUE Deficits / Details: + tremors (increase with intention) RUE Coordination: decreased fine motor;decreased gross motor LUE Deficits / Details: + tremors (increase with intention) LUE Coordination: decreased fine motor;decreased gross motor            Vision   Vision Assessment?: No apparent visual deficits   Perception     Praxis Praxis Praxis: Impaired Praxis Impairment Details: Motor planning;Organization   Communication Communication Communication: Impaired Factors Affecting Communication: Hearing impaired   Cognition Arousal: Alert Behavior During Therapy: Flat affect Cognition: Cognition impaired   Orientation impairments: Situation   Memory impairment (select all impairments): Short-term memory Attention impairment (select first level of  impairment): Sustained attention Executive functioning impairment (select all impairments): Initiation, Organization, Sequencing, Reasoning, Problem solving OT - Cognition Comments: Pt very flat this session answering most questions with grunts and sometimes not following commands until repeated.                 Following commands: Impaired Following commands impaired: Follows one step commands with increased time, Only follows one step commands consistently, Follows multi-step commands inconsistently, Follows multi-step commands with increased time      Cueing   Cueing Techniques: Verbal cues, Tactile cues  Exercises      Shoulder Instructions       General Comments Pt most limited by tremors, poor balance and impaired cogntion.    Pertinent Vitals/ Pain       Pain Assessment Pain Assessment: Faces Faces Pain Scale: Hurts a little bit Pain Location: generalized with mobility Pain Descriptors / Indicators: Discomfort, Grimacing, Guarding Pain Intervention(s): Monitored during session  Home Living                                          Prior Functioning/Environment              Frequency  Min 2X/week        Progress Toward Goals  OT Goals(current goals can now be found in the care plan section)  Progress towards OT goals: Progressing toward goals  Acute Rehab OT Goals Patient Stated Goal: to get stronger OT Goal Formulation: With patient Time For Goal Achievement: 05/08/24 Potential to Achieve Goals: Fair ADL Goals Pt Will Perform Grooming: with set-up;with supervision;sitting Pt Will  Perform Upper Body Bathing: with set-up;with supervision;sitting Pt Will Perform Lower Body Bathing: with min assist;sit to/from stand Pt Will Perform Upper Body Dressing: with min assist Pt Will Perform Lower Body Dressing: with min assist;sit to/from stand Pt Will Transfer to Toilet: with min assist;stand pivot transfer;bedside commode Pt Will  Perform Toileting - Clothing Manipulation and hygiene: with min assist;sit to/from stand Additional ADL Goal #1: Pt will be min A in and OOB for basic ADLs  Plan      Co-evaluation    PT/OT/SLP Co-Evaluation/Treatment: Yes            AM-PAC OT 6 Clicks Daily Activity     Outcome Measure   Help from another person eating meals?: A Little Help from another person taking care of personal grooming?: A Little Help from another person toileting, which includes using toliet, bedpan, or urinal?: A Little Help from another person bathing (including washing, rinsing, drying)?: A Little Help from another person to put on and taking off regular upper body clothing?: A Lot Help from another person to put on and taking off regular lower body clothing?: A Lot 6 Click Score: 16    End of Session Equipment Utilized During Treatment: Back brace;Rolling walker (2 wheels)  OT Visit Diagnosis: Unsteadiness on feet (R26.81);Other abnormalities of gait and mobility (R26.89);Repeated falls (R29.6);History of falling (Z91.81);Other symptoms and signs involving the nervous system (R29.898);Pain   Activity Tolerance Patient limited by fatigue   Patient Left in chair;with call bell/phone within reach;with chair alarm set;with family/visitor present   Nurse Communication Mobility status;Other (comment) (Pt with low BP when first getting up.  Feel pt needs to be walking to bathroom for toileting needs. 3:1 placed over commode for ease of toileting.)        Time: 0920-1000 OT Time Calculation (min): 40 min  Charges: OT General Charges $OT Visit: 1 Visit OT Treatments $Self Care/Home Management : 38-52 mins   Joshua Silvano Dragon 05/01/2024, 10:15 AM

## 2024-05-02 DIAGNOSIS — R4182 Altered mental status, unspecified: Secondary | ICD-10-CM | POA: Diagnosis not present

## 2024-05-02 LAB — GLUCOSE, CAPILLARY
Glucose-Capillary: 146 mg/dL — ABNORMAL HIGH (ref 70–99)
Glucose-Capillary: 122 mg/dL — ABNORMAL HIGH (ref 70–99)

## 2024-05-02 NOTE — TOC Progression Note (Addendum)
 Transition of Care [facility]) - Progression Note    Patient Details  Name: [name] MRN: [id number] Date of Birth: 1950-10-21  Transition of Care [facility]) CM/SW Contact  Inocente GORMAN Kindle, LCSW Phone Number: 05/02/2024, 8:40 AM  Clinical Narrative:    8:40 AM-CSW left voicemail for Amy with [employer] [phone]) to check the status.   2:03 PM-CSW contacted Amy again and went to voicemail.   2:53 PM-CSW spoke with Tammy at [facility] and she stated they have not received the appeal results yet.    Expected Discharge Plan: Skilled Nursing Facility Barriers to Discharge: Continued Medical Work up               Expected Discharge Plan and Services In-house Referral: Clinical Social Work     Living arrangements for the past 2 months: Single Family Home Expected Discharge Date: 04/26/24                                     Social Drivers of Health (SDOH) Interventions SDOH Screenings   Food Insecurity: No Food Insecurity (04/24/2024)  Housing: Low Risk  (04/24/2024)  Transportation Needs: Unmet Transportation Needs (04/24/2024)  Utilities: Not At Risk (04/24/2024)  Depression (PHQ2-9): Low Risk  (04/12/2023)  Financial Resource Strain: Low Risk  (09/28/2023)   Received from [facility]  Social Connections: Moderately Integrated (04/24/2024)  Tobacco Use: Low Risk  (04/23/2024)    Readmission Risk Interventions    04/10/2024   12:44 PM  Readmission Risk Prevention Plan  Post Dischage Appt Complete  Medication Screening Complete  Transportation Screening Complete

## 2024-05-02 NOTE — Progress Notes (Signed)
   05/02/24 1031  Mobility  Activity Ambulated with assistance  Level of Assistance Contact guard assist, steadying assist  Assistive Device None;Front wheel walker  Distance Ambulated (ft) 250 ft  Activity Response Tolerated fair  Mobility Referral Yes  Mobility visit 1 Mobility  Mobility Specialist Start Time (ACUTE ONLY) 1031  Mobility Specialist Stop Time (ACUTE ONLY) 1054  Mobility Specialist Time Calculation (min) (ACUTE ONLY) 23 min   Mobility Specialist: Progress Note  Pt agreeable to mobility session - received in bed. C/o L knee giving out.. Returned to chair with all needs met - call bell within reach. Wife present.   Additional comments: Pt opted to use RW the opted without for second half of ambulation.   Virgle Boards, BS Mobility Specialist Please contact via SecureChat or  Rehab office at [phone].

## 2024-05-02 NOTE — Progress Notes (Signed)
 PROGRESS NOTE                                                                                                                                                                                                             Patient Demographics:    [name], is a 73 y.o. male, DOB - 1950-06-23, FMW:[id number]  Outpatient Primary MD for the patient is Aletha Bene, MD    LOS - 8  Admit date - 04/23/2024    Chief Complaint  Patient presents with   Altered Mental Status       Brief Narrative (HPI from H&P)     73 y.o. male with medical history significant for type 2 diabetes, peripheral neuropathy, hyperlipidemia, hypertension, chronic tremors, mild cognitive impairment, parkinsonian features, who presents to the ER due to worsening tremors for the past 2 days.  Associated with some confusion.  His wife was concerned about possible right facial droop.   In the ER, the patient was seen by neurology/stroke team.  CT head without contrast was nonacute.  MRI brain was negative for acute CVA.  EDP requesting admission for management of tremors.  Admitted by [facility], hospitalist service.   Subjective:   Patient in bed, appears comfortable, he denies any complaints this morning.     Assessment  & Plan :    Acute metabolic encephalopathy, POA:   -CT head followed by MRI brain unremarkable.  Patient is fully alert and oriented and back to baseline now.  Minimize narcotics and muscle relaxants, close to baseline but overall weak and deconditioned, he was discharged to SNF but insurance has declined placement, we await safe disposition.  TOC working  AKI on CKD 3B, suspect prerenal  - Baseline creatinine appears to be between 1.5-1.8 with GFR 47, Presented with creatinine of 2.0 which has improved to 1.85.  Continue IV fluids, avoid nephrotoxic agent.  Creatinine improved, follow intermittently   Hypertension:  -Blood pressure slightly  on the high side will place on low-dose Coreg and monitor continue to hold ACE and ARB due to recent AKI and dehydration, as needed hydralazine.   Chronic tremors, worsening:  -Tremors first noted maybe about 2 years ago, they were rare and not really that bothersome. They worsened about 3 weeks ago while he was at [facility] but significantly improved with sinemet and reducing gabapentin  and  opiod. He was discharged to rehab where he was doing better. Over the last week, would notice brief tremors/jerking that would wake him up at night. Also poor po intake at rehab and not drinking enough water.  Seen by neurology, holding gabapentin  per their recommendations and continuing current dose of Sinemet.  Neurology postdischarge will follow-up with Dr. Evonnie, patient's personal neurologist has left the practice according to the wife.   Severe hypophosphatemia: Replaced and stable   BPH  Resume home tamsulosin    Constipation: On bowel regimen and resolved  Generalized weakness PT OT evaluation Qualifies for SNF but denied by insurance.            Family Communication  : None at bedside  Code Status : Full code  Consults  : Neurology  PUD Prophylaxis :    Procedures  :     MRI - 1. No acute infarction. 2. Age-related atrophy and mild-to-moderate cerebral white matter disease.      Disposition Plan  :    Status is: Inpatient  DVT Prophylaxis  :    enoxaparin (LOVENOX) injection 40 mg Start: 04/25/24 2200    Lab Results  Component Value Date   PLT 264 04/29/2024    Diet :  Diet Order             Diet heart healthy/carb modified Room service appropriate? Yes; Fluid consistency: Thin  Diet effective now                    Inpatient Medications  Scheduled Meds:  aspirin EC  81 mg Oral Once per day on Monday Thursday   carbidopa-levodopa  1 tablet Oral TID   carvedilol  3.125 mg Oral BID WC   cholecalciferol  5,000 Units Oral Daily   donepezil   5 mg Oral q AM    enoxaparin (LOVENOX) injection  40 mg Subcutaneous Q24H   ezetimibe   10 mg Oral QHS   memantine   28 mg Oral Daily   polyethylene glycol  17 g Oral Daily   tamsulosin   0.4 mg Oral Daily   Continuous Infusions: PRN Meds:.acetaminophen , allopurinol , diphenhydrAMINE, hydrALAZINE, melatonin  Antibiotics  :    Anti-infectives (From admission, onward)    None         Objective:   Vitals:   05/01/24 2036 05/01/24 2348 05/02/24 0504 05/02/24 0700  BP:  108/80 122/73 120/64  Pulse: 79 78 76   Resp:      Temp: 97.8 F (36.6 C) 97.9 F (36.6 C) (!) 97.5 F (36.4 C) 98 F (36.7 C)  TempSrc: Oral Oral Oral Oral  SpO2:      Weight:      Height:        Wt Readings from Last 3 Encounters:  04/23/24 66.7 kg  04/10/24 71.6 kg  03/18/24 72.6 kg    No intake or output data in the 24 hours ending 05/02/24 1133    Physical Exam  Awake, alert, no apparent distress, frail  Good air entry  Abdomen soft    Data Review:    Recent Labs  Lab 04/29/24 0317  WBC 7.0  HGB 16.7  HCT 49.3  PLT 264  MCV 82.9  MCH 28.1  MCHC 33.9  RDW 12.6  LYMPHSABS 1.7  MONOABS 0.6  EOSABS 0.5  BASOSABS 0.1    Recent Labs  Lab 04/29/24 0317 04/30/24 0433 05/01/24 0253  NA 136  --  139  K 4.2  --  3.8  CL 99  --  103  CO2 27  --  21*  ANIONGAP 10  --  15  GLUCOSE 124*  --  169*  BUN 26*  --  22  CREATININE 1.75* 1.87* 1.82*  MG 1.9  --   --   PHOS  --   --  2.8  CALCIUM  9.6  --  9.2      Recent Labs  Lab 04/29/24 0317 05/01/24 0253  MG 1.9  --   CALCIUM  9.6 9.2    --------------------------------------------------------------------------------------------------------------- Lab Results  Component Value Date   CHOL 161 12/06/2023   HDL 45 12/06/2023   LDLCALC 94 12/06/2023   TRIG 127 12/06/2023   CHOLHDL 3.6 12/06/2023    Lab Results  Component Value Date   HGBA1C 6.3 (H) 12/06/2023   No results for input(s): TSH, T4TOTAL, FREET4, T3FREE,  THYROIDAB in the last 72 hours. No results for input(s): VITAMINB12, FOLATE, FERRITIN, TIBC, IRON, RETICCTPCT in the last 72 hours. ------------------------------------------------------------------------------------------------------------------ Cardiac Enzymes No results for input(s): CKMB, TROPONINI, MYOGLOBIN in the last 168 hours.  Invalid input(s): CK  Micro Results No results found for this or any previous visit (from the past 240 hours).  Radiology Report  No results found.    Signature  -   Brayton Lye M.D on 05/02/2024 at 11:33 AM   -  To page go to [email or web address]

## 2024-05-02 NOTE — Progress Notes (Signed)
 Physical Therapy Treatment Patient Details Name: [name] MRN: [id number] DOB: Apr 13, 1951 Today's Date: 05/02/2024   History of Present Illness [name] is a 73 y.o. male presented 04/23/24 from [facility] to ED with worsening tremors, increased confusion, right eye looking different. MRI and CT -negative for acute event. 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 Pt noted to be much more fatigued this session compared to previous session stating that he felt like he overdid it yesterday. Despite this pt was able to ambulate in hallway with RW Min A with several standing rest breaks and navigate 2 stairs with Min A. Max verbal cues for sequencing and safety throughout session. No change in DC/DME recs at this time. PT will continue to follow.     If plan is discharge home, recommend the following: Assist for transportation;Help with stairs or ramp for entrance;Direct supervision/assist for medications management;Direct supervision/assist for financial management;Assistance with cooking/housework;Supervision due to cognitive status;A little help with walking and/or transfers;A little help with bathing/dressing/bathroom   Can travel by private vehicle     No  Equipment Recommendations  Rolling walker (2 wheels);BSC/3in1    Recommendations for Other Services       Precautions / Restrictions Precautions Precautions: Fall;Back Precaution Booklet Issued: No (verbally reviewed) Recall of Precautions/Restrictions: Impaired Precaution/Restrictions Comments: reviewed back precautions Required Braces or Orthoses: Other Brace;Spinal Brace Spinal Brace: Thoracolumbosacral orthotic;Applied in sitting position Restrictions Weight Bearing Restrictions Per Provider Order: No     Mobility  Bed Mobility Overal bed mobility: Needs  Assistance Bed Mobility: Supine to Sit, Sit to Supine     Supine to sit: Contact guard Sit to supine: Contact guard assist   General bed mobility comments: Pt constantly cued for back precautions however pt nonadherant.    Transfers Overall transfer level: Needs assistance Equipment used: Rolling walker (2 wheels) Transfers: Sit to/from Stand Sit to Stand: Contact guard assist           General transfer comment: CGA for safety standing from recliner, pt rocking trunk to gain momentum to stand. Cues needed to back up until legs were touching the chair and reach back prior to sitting as pt sat without doing either, CGA for safety    Ambulation/Gait Ambulation/Gait assistance: Contact guard assist, Min assist Gait Distance (Feet): 250 Feet Assistive device: Rolling walker (2 wheels) Gait Pattern/deviations: Decreased stride length, Trunk flexed, Step-through pattern, Shuffle Gait velocity: reduced Gait velocity interpretation: <1.8 ft/sec, indicate of risk for recurrent falls   General Gait Details: Pt with similar presentation to previous session however noted to be much more fatigued this session compared to previous session. Pt not following commands very well this session and noted to take several standing rest breaks. Up to Min A for balance.   Stairs Stairs: Yes Stairs assistance: Min assist Stair Management: Two rails, Step to pattern, Forwards, Backwards Number of Stairs: 2 General stair comments: Cued for sequencing. Min A to power up.   Wheelchair Mobility     Tilt Bed    Modified Rankin (Stroke Patients Only)       Balance Overall balance assessment: Needs assistance Sitting-balance support: Feet supported Sitting balance-Leahy Scale: Fair     Standing balance support: Bilateral upper extremity supported, During functional activity, No upper extremity supported Standing balance-Leahy Scale: Fair Standing balance comment: Able to stand statically  without UE support but displays trunk sway when ambulating without UE support, needing minA for balance. CGA to ambulate with RW                            Communication Communication Communication: Impaired Factors Affecting Communication: Hearing impaired  Cognition Arousal: Alert Behavior During Therapy: WFL for tasks assessed/performed   PT - Cognitive impairments: Problem solving, History of cognitive impairments, Safety/Judgement, Memory                       PT - Cognition Comments: Wife reports hx of dementia and memory loss at baseline. Decreased insight into his balance deficits placing him at risk for falls, wanting to ambulate without RW at times but still needing it for safety Following commands: Impaired Following commands impaired: Follows multi-step commands with increased time    Cueing Cueing Techniques: Verbal cues, Tactile cues  Exercises      General Comments General comments (skin integrity, edema, etc.): BP: 134/96 seated      Pertinent Vitals/Pain Pain Assessment Pain Assessment: Faces Faces Pain Scale: Hurts a little bit Pain Location: generalized with mobility Pain Descriptors / Indicators: Discomfort, Grimacing, Guarding Pain Intervention(s): Limited activity within patient's tolerance, Monitored during session, Repositioned    Home Living                          Prior Function            PT Goals (current goals can now be found in the care plan section) Acute Rehab PT Goals Patient Stated Goal: to go back to rehab Progress towards PT goals: Progressing toward goals    Frequency    Min 2X/week      PT Plan      Co-evaluation              AM-PAC PT 6 Clicks Mobility   Outcome Measure  Help needed turning from your back to your side while in a flat bed without using bedrails?: A Little Help needed moving from lying on your back to sitting on the side of a flat bed without using bedrails?: A  Little Help needed moving to and from a bed to a chair (including a wheelchair)?: A Little Help needed standing up from a chair using your arms (e.g., wheelchair or bedside chair)?: A Little Help needed to walk in hospital room?: A Little Help needed climbing 3-5 steps with a railing? : Total 6 Click Score: 16    End of Session Equipment Utilized During Treatment: Back brace Activity Tolerance: Patient tolerated treatment well Patient left: with call bell/phone within reach;in bed;with bed alarm set Nurse Communication: Mobility status PT Visit Diagnosis: Other abnormalities of gait and mobility (R26.89);Muscle weakness (generalized) (M62.81);Other symptoms and signs involving the nervous system (R29.898);Unsteadiness on feet (R26.81);Difficulty in walking, not elsewhere classified (R26.2)     Time: 9099-9076 PT Time Calculation (min) (ACUTE ONLY): 23 min  Charges:    $Gait Training: 23-37 mins PT General Charges $$ ACUTE PT VISIT: 1 Visit                     Sueellen NOVAK, PT, DPT Acute Rehab Services [phone]    [name] 05/02/2024, 9:51 AM

## 2024-05-02 NOTE — Plan of Care (Signed)

## 2024-05-03 DIAGNOSIS — R4182 Altered mental status, unspecified: Secondary | ICD-10-CM | POA: Diagnosis not present

## 2024-05-03 LAB — GLUCOSE, CAPILLARY: Glucose-Capillary: 145 mg/dL — ABNORMAL HIGH (ref 70–99)

## 2024-05-03 MED ORDER — MELATONIN 5 MG PO TABS
5.0000 mg | ORAL_TABLET | Freq: Every evening | ORAL | Status: AC | PRN
Start: 2024-05-03 — End: ?

## 2024-05-03 MED ORDER — CARBIDOPA-LEVODOPA 25-100 MG PO TABS: 1.0000 | ORAL_TABLET | Freq: Three times a day (TID) | ORAL | Status: DC

## 2024-05-03 NOTE — Plan of Care (Signed)
  Problem: Activity: Goal: Risk for activity intolerance will decrease Outcome: Not Progressing   Problem: Safety: Goal: Ability to remain free from injury will improve Outcome: Not Progressing   Problem: Health Behavior/Discharge Planning: Goal: Ability to manage health-related needs will improve Outcome: Not Progressing

## 2024-05-03 NOTE — TOC Transition Note (Signed)
 Transition of Care [facility]) - Discharge Note   Patient Details  Name: [name] MRN: [id number] Date of Birth: 1950-06-29  Transition of Care [facility]) CM/SW Contact:  Inocente GORMAN Kindle, LCSW Phone Number: 05/03/2024, 12:05 PM   Clinical Narrative:    Patient will DC to: [facility] Anticipated DC date: 05/03/24 Family notified: Spouse, Cathryne  Transport by: [facility]   Per MD patient ready for DC to [facility]. RN to call report prior to discharge [phone] room 1203). RN, patient, patient's family, and facility notified of DC. Discharge Summary and FL2 sent to facility. DC packet on chart. Ambulance transport requested for patient.   CSW will sign off for now as social work intervention is no longer needed. Please consult us  again if new needs arise.     Final next level of care: Skilled Nursing Facility Barriers to Discharge: Barriers Resolved   Patient Goals and CMS Choice Patient states their goals for this hospitalization and ongoing recovery are:: getting stronger at snf   Choice offered to / list presented to : Spouse [place] ownership interest in [facility].provided to:: Spouse    Discharge Placement   Existing PASRR number confirmed : 05/03/24          Patient chooses bed at: [facility] Patient to be transferred to facility by: [facility] Name of family member notified: Spouse Patient and family notified of of transfer: 05/03/24  Discharge Plan and Services Additional resources added to the After Visit Summary for   In-house Referral: Clinical Social Work   Post Acute Care Choice: Skilled Nursing Facility                               Social Drivers of Health (SDOH) Interventions SDOH Screenings   Food Insecurity: No Food Insecurity (04/24/2024)  Housing: Low Risk  (04/24/2024)  Transportation Needs: Unmet Transportation Needs (04/24/2024)  Utilities: Not At Risk (04/24/2024)  Depression (PHQ2-9): Low Risk  (04/12/2023)  Financial Resource  Strain: Low Risk  (09/28/2023)   Received from [facility]  Social Connections: Moderately Integrated (04/24/2024)  Tobacco Use: Low Risk  (04/23/2024)     Readmission Risk Interventions    04/10/2024   12:44 PM  Readmission Risk Prevention Plan  Post Dischage Appt Complete  Medication Screening Complete  Transportation Screening Complete

## 2024-05-03 NOTE — Discharge Summary (Signed)
 Physician Discharge Summary  [name] FMW:[id number] DOB: 03-06-1951 DOA: 04/23/2024  PCP: Aletha Bene, MD  Admit date: 04/23/2024 Discharge date: 05/03/2024  Admitted From: (Home) Disposition:  (SNF)  Recommendations for Outpatient Follow-up:  Follow up with PCP in 1-2 weeks Please obtain BMP/CBC in one week Patient to follow-up with neurology as an outpatient.   Diet recommendation: Heart Healthy  Brief/Interim Summary:  73 y.o. male with medical history significant for type 2 diabetes, peripheral neuropathy, hyperlipidemia, hypertension, chronic tremors, mild cognitive impairment, parkinsonian features, who presents to the ER due to worsening tremors for the past 2 days.  Associated with some confusion.  His wife was concerned about possible right facial droop.   In the ER, the patient was seen by neurology/stroke team.  CT head without contrast was nonacute.  MRI brain was negative for acute CVA.  Confusion felt most likely due to medications, please see discussion below    Acute metabolic encephalopathy, POA:   -CT head followed by MRI brain unremarkable.  Patient is fully alert and oriented and back to baseline now.  On gabapentin , scheduled tramadol and as needed Vicodin, as well scheduled Robaxin, this all will contribute to his confusion, this all has been discontinued during hospital stay, mentation back to baseline , I will hold all these meds at time of discharge as he did well during hospital stay without any of these meds.   AKI on CKD 3B, suspect prerenal  - Baseline creatinine appears to be between 1.5-1.8 with GFR 47, Presented with creatinine of 2.0 which has improved to 1.82.    Hypertension:  -Initially all meds has been held, lisinopril has been discontinued due to worsening renal function, dehydration and baseline renal insufficiency. -Started on low-dose Coreg with good control.   Chronic tremors, worsening:  -Tremors first noted maybe about 2 years ago,  they were rare and not really that bothersome. They worsened about 3 weeks ago while he was at [facility] but significantly improved with sinemet and reducing gabapentin  and opiod. He was discharged to rehab where he was doing better. Over the last week, would notice brief tremors/jerking that would wake him up at night. Also poor po intake at rehab and not drinking enough water.  Seen by neurology, holding gabapentin  per their recommendations and continuing current dose of Sinemet.  Neurology postdischarge will follow-up with Dr. Evonnie, patient's personal neurologist has left the practice according to the wife.   Severe hypophosphatemia: Replaced and stable   BPH  Resume home tamsulosin    Constipation: On bowel regimen during hospital stay, resolved   Generalized weakness PT OT evaluation Plan for SNF placement.        Discharge Diagnoses:  Principal Problem:   AMS (altered mental status)    Discharge Instructions  Discharge Instructions     Discharge instructions   Complete by: As directed    Follow with Primary MD Aletha Bene, MD in 7 days   Get CBC, CMP, Magnesium, 2 view Chest X ray -  checked next visit with your primary MD or SNF MD   Activity: As tolerated with Full fall precautions use walker/cane & assistance as needed  Disposition SNF  Diet: Heart Healthy and low carbohydrate, check CBGs q. ACHS.  Special Instructions: If you have smoked or chewed Tobacco  in the last 2 yrs please stop smoking, stop any regular Alcohol  and or any Recreational drug use.  On your next visit with your primary care physician please Get Medicines reviewed and adjusted.  Please request your Prim.MD to go over all Hospital Tests and Procedure/Radiological results at the follow up, please get all Hospital records sent to your Prim MD by signing hospital release before you go home.  If you experience worsening of your admission symptoms, develop shortness of breath, life threatening emergency,  suicidal or homicidal thoughts you must seek medical attention immediately by calling 911 or calling your MD immediately  if symptoms less severe.  You Must read complete instructions/literature along with all the possible adverse reactions/side effects for all the Medicines you take and that have been prescribed to you. Take any new Medicines after you have completely understood and accpet all the possible adverse reactions/side effects.   Do not drive when taking Pain medications.  Do not take more than prescribed Pain, Sleep and Anxiety Medications  Wear Seat belts while driving.   Increase activity slowly   Complete by: As directed    No wound care   Complete by: As directed       Allergies as of 05/03/2024       Reactions   Tape Other (See Comments)   SKIN IS VERY THIN. IT TEARS AND BRUISES VERY EASILY!!   Crestor [rosuvastatin] Diarrhea   Penicillins Hives, Nausea And Vomiting, Swelling, Other (See Comments)   Severe hives and possible swelling of the throat   Welchol [colesevelam Hcl] Diarrhea   Zoloft [sertraline Hcl] Other (See Comments)   Reaction not recalled        Medication List     STOP taking these medications    enalapril  10 MG tablet Commonly known as: VASOTEC    gabapentin  100 MG capsule Commonly known as: NEURONTIN    HYDROcodone -acetaminophen  5-325 MG tablet Commonly known as: NORCO/VICODIN   lidocaine  5 % Commonly known as: LIDODERM    methocarbamol 500 MG tablet Commonly known as: ROBAXIN   traMADol 50 MG tablet Commonly known as: ULTRAM       TAKE these medications    acetaminophen  500 MG tablet Commonly known as: TYLENOL  Take 1 tablet (500 mg total) by mouth every 8 (eight) hours as needed.   allopurinol  300 MG tablet Commonly known as: ZYLOPRIM  Take 300 mg by mouth daily as needed (for gout flares).   aspirin EC 81 MG tablet Take 81 mg by mouth 2 (two) times a week. Swallow whole. Take on Mon and Thurs   carbidopa-levodopa  25-100 MG tablet Commonly known as: SINEMET IR Take 1 tablet by mouth 3 (three) times daily.   carvedilol 3.125 MG tablet Commonly known as: COREG Take 1 tablet (3.125 mg total) by mouth 2 (two) times daily with a meal.   donepezil  5 MG tablet Commonly known as: ARICEPT  Take 5 mg by mouth in the morning.   ezetimibe  10 MG tablet Commonly known as: ZETIA  TAKE 1 TABLET BY MOUTH DAILY FOR CHOLESTEROL What changed:  how much to take how to take this when to take this   Magnesium 250 MG Tabs Take 250 mg by mouth daily.   melatonin 5 MG Tabs Take 1 tablet (5 mg total) by mouth at bedtime as needed.   memantine  28 MG Cp24 24 hr capsule Commonly known as: NAMENDA  XR Take 1 capsule (28 mg total) by mouth daily.   ondansetron  4 MG tablet Commonly known as: ZOFRAN  Take 4 mg by mouth every 4 (four) hours as needed for nausea or vomiting.   polyethylene glycol 17 g packet Commonly known as: MIRALAX / GLYCOLAX Take 17 g by mouth daily.  tamsulosin  0.4 MG Caps capsule Commonly known as: FLOMAX  Take 1 capsule (0.4 mg total) by mouth daily. Help with urination What changed: when to take this   Vitamin D3 125 MCG (5000 UT) Tabs Take 5,000 Units by mouth daily.       ASK your doctor about these medications    nystatin 100000 UNIT/ML suspension Commonly known as: MYCOSTATIN Take 4 mLs by mouth 3 (three) times daily. Ask about: Should I take this medication?        Contact information for follow-up providers     Aletha Bene, MD. Schedule an appointment as soon as possible for a visit in 1 week(s).   Specialty: Family Medicine Contact information: [address] [place] [address] [phone]         TatAsberry RAMAN, DO. Schedule an appointment as soon as possible for a visit in 1 week(s).   Specialty: Neurology Contact information: [address] [place]  [address] [place] [address] [phone]              Contact information for  after-discharge care     Destination     [facility] .   Service: Skilled Nursing Contact information: [address] [place]  [address] [phone]                    Allergies  Allergen Reactions   Tape Other (See Comments)    SKIN IS VERY THIN. IT TEARS AND BRUISES VERY EASILY!!   Crestor [Rosuvastatin] Diarrhea   Penicillins Hives, Nausea And Vomiting, Swelling and Other (See Comments)    Severe hives and possible swelling of the throat   Welchol [Colesevelam Hcl] Diarrhea   Zoloft [Sertraline Hcl] Other (See Comments)    Reaction not recalled     Consultations: Neurology   Procedures/Studies: MR BRAIN WO CONTRAST Result Date: 04/23/2024 EXAM: MRI BRAIN WITHOUT CONTRAST 04/23/2024 06:51:06 PM TECHNIQUE: Multiplanar multisequence MRI of the head/brain was performed without the administration of intravenous contrast. COMPARISON: MRI of the head dated 04/03/2024. CLINICAL HISTORY: Neuro deficit, acute, stroke suspected. FINDINGS: BRAIN AND VENTRICLES: Age-related atrophy. Mild-to-moderate cerebral white matter disease. There is no restricted diffusion to indicate acute or recent infarction. No intracranial hemorrhage. No mass. No midline shift. No hydrocephalus. The sella is unremarkable. Normal flow voids. ORBITS: No acute abnormality. SINUSES AND MASTOIDS: No acute abnormality. BONES AND SOFT TISSUES: Normal marrow signal. No acute soft tissue abnormality. IMPRESSION: 1. No acute infarction. 2. Age-related atrophy and mild-to-moderate cerebral white matter disease. Electronically signed by: Evalene Coho MD 04/23/2024 06:58 PM EST RP Workstation: [id number]   CT Head Wo Contrast Result Date: 04/23/2024 EXAM: CT HEAD WITHOUT CONTRAST 04/23/2024 06:21:00 PM TECHNIQUE: CT of the head was performed without the administration of intravenous contrast. Automated exposure control, iterative reconstruction, and/or weight based  adjustment of the mA/kV was utilized to reduce the radiation dose to as low as reasonably achievable. COMPARISON: 04/02/2024 CLINICAL HISTORY: Delirium FINDINGS: BRAIN AND VENTRICLES: No acute hemorrhage. No evidence of acute infarct. Diffuse cerebral atrophy. Chronic small vessel disease throughout the deep white matter. Old left basal ganglia lacunar infarct, stable. No hydrocephalus. No extra-axial collection. No mass effect or midline shift. ORBITS: No acute abnormality. SINUSES: No acute abnormality. SOFT TISSUES AND SKULL: No acute soft tissue abnormality. No skull fracture. IMPRESSION: 1. No acute intracranial abnormality. 2. Diffuse cerebral atrophy and chronic small vessel disease throughout the deep white matter. 3. Stable old left  basal ganglia lacunar infarct. Electronically signed by: Franky Crease MD 04/23/2024 06:26 PM EST RP Workstation: [id number]   DG Chest Port 1 View Result Date: 04/23/2024 EXAM: 1 VIEW(S) XRAY OF THE CHEST 04/23/2024 06:01:00 PM COMPARISON: None available. CLINICAL HISTORY: ams FINDINGS: LUNGS AND PLEURA: Small calcified granuloma in the right upper lung. No pulmonary edema. No pleural effusion. No pneumothorax. HEART AND MEDIASTINUM: Aortic atherosclerosis. No acute abnormality of the cardiac and mediastinal silhouettes. BONES AND SOFT TISSUES: No acute osseous abnormality. IMPRESSION: 1. No acute cardiopulmonary process. Electronically signed by: Luke Bun MD 04/23/2024 06:05 PM EST RP Workstation: [id number]   MR CERVICAL SPINE WO CONTRAST Result Date: 04/07/2024 EXAM: MRI CERVICAL SPINE WITHOUT CONTRAST 04/07/2024 08:00:53 AM TECHNIQUE: Multiplanar multisequence MRI of the cervical spine was performed. COMPARISON: CT of the cervical spine dated 04/02/2024 and MRI of the cervical spine dated 03/17/2023. CLINICAL HISTORY: Myelopathy, acute, cervical spine. Myelopathy, acute, cervical spine ; Prior 03/29/23. Myelopathy, acute, cervical spine. Myelopathy, acute, cervical  spine ; Prior 03/29/23. FINDINGS: BONES AND ALIGNMENT: Normal alignment. Normal vertebral body heights. Bone marrow signal is unremarkable. SPINAL CORD: Normal spinal cord size. No abnormal spinal cord signal. SOFT TISSUES: No paraspinal mass. C2-C3: No significant disc herniation. No spinal canal stenosis or neural foraminal narrowing. C3-C4: Chronic degenerative disc disease with right-sided uncovertebral joint hypertrophy and facet arthrosis, with mild-to-moderate central spinal canal stenosis and moderate right neural foraminal stenosis. C4-C5: No significant disc herniation. No spinal canal stenosis or neural foraminal narrowing. C5-C6: No significant disc herniation. No spinal canal stenosis or neural foraminal narrowing. C6-C7: Broad-based disc bulging, with mild-to-moderate central spinal canal stenosis. The neural foramina are patent. C7-T1: No significant disc herniation. No spinal canal stenosis or neural foraminal narrowing. IMPRESSION: 1. Chronic degenerative disc disease at C3-4 with mild-to-moderate central spinal canal stenosis and moderate right neural foraminal stenosis. 2. Broad-based disc bulging at C6-7 with mild-to-moderate central spinal canal stenosis. Neural foramina are patent. Electronically signed by: Evalene Coho MD 04/07/2024 08:50 AM EDT RP Workstation: [id number]   ECHOCARDIOGRAM COMPLETE Result Date: 04/05/2024    ECHOCARDIOGRAM REPORT   Patient Name:   [name] Date of Exam: 04/05/2024 Medical Rec #:  [id number]   Height:       66.0 in Accession #:    [id number]  Weight:       152.6 lb Date of Birth:  27-Jun-1950   BSA:          1.782 m Patient Age:    73 years    BP:           121/72 mmHg Patient Gender: M           HR:           79 bpm. Exam Location:  Inpatient Procedure: 2D Echo, Cardiac Doppler and Color Doppler (Both Spectral and Color            Flow Doppler were utilized during procedure). Indications:    Elevated Troponin  History:        Patient has prior history  of Echocardiogram examinations and                 Patient has no prior history of Echocardiogram examinations.                 Risk Factors:Hypertension and Diabetes.  Sonographer:    Jayson Gaskins Referring Phys: [id number] DAVID MANUEL ORTIZ IMPRESSIONS  1. Left ventricular ejection fraction, by estimation, is 55 to 60%. The left ventricle  has normal function. The left ventricle has no regional wall motion abnormalities. Left ventricular diastolic parameters are consistent with Grade I diastolic dysfunction (impaired relaxation).  2. Right ventricular systolic function is normal. The right ventricular size is normal.  3. The mitral valve is normal in structure. No evidence of mitral valve regurgitation.  4. The aortic valve is tricuspid. Aortic valve regurgitation is not visualized. Aortic valve sclerosis is present, with no evidence of aortic valve stenosis.  5. The inferior vena cava is normal in size with greater than 50% respiratory variability, suggesting right atrial pressure of 3 mmHg. Comparison(s): No prior Echocardiogram. FINDINGS  Left Ventricle: Left ventricular ejection fraction, by estimation, is 55 to 60%. The left ventricle has normal function. The left ventricle has no regional wall motion abnormalities. The left ventricular internal cavity size was normal in size. There is  no left ventricular hypertrophy. Left ventricular diastolic parameters are consistent with Grade I diastolic dysfunction (impaired relaxation). Right Ventricle: The right ventricular size is normal. No increase in right ventricular wall thickness. Right ventricular systolic function is normal. Left Atrium: Left atrial size was normal in size. Right Atrium: Right atrial size was normal in size. Pericardium: There is no evidence of pericardial effusion. Mitral Valve: The mitral valve is normal in structure. No evidence of mitral valve regurgitation. Tricuspid Valve: The tricuspid valve is grossly normal. Tricuspid valve  regurgitation is trivial. Aortic Valve: The aortic valve is tricuspid. Aortic valve regurgitation is not visualized. Aortic valve sclerosis is present, with no evidence of aortic valve stenosis. Aortic valve mean gradient measures 4.0 mmHg. Aortic valve peak gradient measures 7.1  mmHg. Aortic valve area, by VTI measures 1.76 cm. Pulmonic Valve: The pulmonic valve was grossly normal. Pulmonic valve regurgitation is trivial. Aorta: The aortic root is normal in size and structure. Venous: The inferior vena cava is normal in size with greater than 50% respiratory variability, suggesting right atrial pressure of 3 mmHg. IAS/Shunts: The interatrial septum was not well visualized.  LEFT VENTRICLE PLAX 2D LVIDd:         3.70 cm   Diastology LVIDs:         2.10 cm   LV e' medial:    5.98 cm/s LV PW:         0.90 cm   LV E/e' medial:  9.5 LV IVS:        0.80 cm   LV e' lateral:   6.74 cm/s LVOT diam:     1.90 cm   LV E/e' lateral: 8.4 LV SV:         41 LV SV Index:   23 LVOT Area:     2.84 cm  RIGHT VENTRICLE RV Basal diam:  3.50 cm RV Mid diam:    3.00 cm LEFT ATRIUM             Index        RIGHT ATRIUM           Index LA Vol (A2C):   27.6 ml 15.48 ml/m  RA Area:     15.10 cm LA Vol (A4C):   21.9 ml 12.29 ml/m  RA Volume:   37.70 ml  21.15 ml/m LA Biplane Vol: 26.1 ml 14.64 ml/m  AORTIC VALVE AV Area (Vmax):    1.66 cm AV Area (Vmean):   1.68 cm AV Area (VTI):     1.76 cm AV Vmax:           133.00 cm/s AV Vmean:  97.400 cm/s AV VTI:            0.231 m AV Peak Grad:      7.1 mmHg AV Mean Grad:      4.0 mmHg LVOT Vmax:         77.70 cm/s LVOT Vmean:        57.700 cm/s LVOT VTI:          0.143 m LVOT/AV VTI ratio: 0.62  AORTA Ao Root diam: 2.60 cm MITRAL VALVE MV Area (PHT): 2.33 cm    SHUNTS MV Decel Time: 325 msec    Systemic VTI:  0.14 m MV E velocity: 56.60 cm/s  Systemic Diam: 1.90 cm MV A velocity: 72.80 cm/s MV E/A ratio:  0.78 Emeline Calender Electronically signed by Emeline Calender Signature Date/Time:  04/05/2024/10:10:09 AM    Final    MR LUMBAR SPINE WO CONTRAST Result Date: 04/03/2024 EXAM: MRI LUMBAR SPINE 04/03/2024 06:54:00 PM TECHNIQUE: Multiplanar multisequence MRI of the lumbar spine was performed without the administration of intravenous contrast. COMPARISON: Comparison to prior MRI from 03/17/2023. CLINICAL HISTORY: Low back pain, trauma. Neuro deficit, acute, stroke suspected. Low back pain, trauma. FINDINGS: BONES AND ALIGNMENT: Normal alignment. Acute compression fracture involving the superior endplate of L1 associated height loss measures up to 35% without significant retropulsion. This is benign/mechanical in appearance. Chronic compression fracture of T12 with up to 50% height loss and trace 2 mm bony retropulsion. Vertebral body height otherwise maintained with no other acute or chronic fracture. Bone marrow signal intensity within normal limits. Probable small atypical hemangioma noted within the S2 segment. No worrisome osseous lesions. Trace facet mediastinal osseous L5 on S1. Low waveform disc space labeled the L5 S1 level. SPINAL CORD: The conus medullaris terminates at the level of L1. SOFT TISSUES: Paraspinal soft tissues demonstrate no acute findings. Few scattered T2 hyperintense cysts noted about the visualized kidneys, benign in appearance, no follow up imaging recommended. T11-T12: Mild disc bulge with 2 mm bony retropulsion related to the chronic T12 fracture. No significant spinal stenosis. Foramina appear grossly patent. T12-L1: Minimal disc bulge superimposed left foraminal disc protrusion (series 12, image 7). No spinal stenosis. Mild bilateral foraminal narrowing. L1-L2: Mild disc bulge superimposed left foraminal and extraforaminal disc protrusion closely approximates to the left L1 nerve root. No spinal stenosis. Mild bilateral foraminal narrowing. L2-L3: Mild disc bulge with endplate spurring. Mild facet hypertrophy. Result in mild narrowing of the right lateral recess.  Central canal remains patent. Mild bilateral L2 foraminal narrowing. L3-L4: Mild disc bulge with endplate spurring. Superimposed left foraminal to extraforaminal disc protrusion (series 13, image 28). Mild bilateral facet spurring. No spinal stenosis. Moderate left with mild right L3 foraminal narrowing. L4-L5: Mild disc bulge. Mild endplate spurring. Mild bilateral facet hypertrophy. No significant spinal stenosis. Mild right with moderate left L4 foraminal stenosis. L5-S1: Negative interspace. Moderate right worse than left facet hypertrophy. Resultant mild bilateral subarticular stenosis. Central canal remains patent. No significant foraminal stenosis. IMPRESSION: 1. Acute compression fracture of L1 superior endplate with up to 35% height loss, but with no significant retropulsion. 2. Chronic compression fracture of T12 with up to 50% height loss and trace (2 mm) bony retropulsion. 3. Underlying multilevel lumbar spondylosis without significant spinal stenosis. . Mild to moderate multilevel foraminal narrowing as above. Electronically signed by: Morene Hoard MD 04/03/2024 07:18 PM EDT RP Workstation: [id number]   MR BRAIN WO CONTRAST Result Date: 04/03/2024 EXAM: MRI BRAIN WITHOUT CONTRAST 04/03/2024 06:54:00 PM TECHNIQUE: Multiplanar multisequence MRI  of the head/brain was performed without the administration of intravenous contrast. COMPARISON: Comparison with prior CT from 04/02/2024. CLINICAL HISTORY: Neuro deficit, acute, stroke suspected. Neuro deficit, acute, stroke suspected. Low back pain, trauma. Neuro deficit, acute, stroke suspected. Neuro deficit, acute, stroke suspected. Low back pain, trauma. FINDINGS: BRAIN AND VENTRICLES: No acute infarct. No intracranial hemorrhage. No mass. No midline shift. No hydrocephalus. Mild age related cerebral atrophy with chronic small vessel ischemic disease. Few small remote lacunar infarcts present about the bilateral corona radiata/basal ganglia. The  sella is unremarkable. Normal flow voids. ORBITS: Prior oculolens replacement on the left. No acute abnormality. SINUSES AND MASTOIDS: No acute abnormality. BONES AND SOFT TISSUES: Normal marrow signal. No acute soft tissue abnormality. IMPRESSION: 1. No acute intracranial abnormality. 2. Mild age-related cerebral atrophy with chronic small vessel ischemic disease. Electronically signed by: Morene Hoard MD 04/03/2024 07:10 PM EDT RP Workstation: [id number]   (Echo, Carotid, EGD, Colonoscopy, ERCP)    Subjective: Reports he has been feeling cold overnight, had some cold feeling in his knees, this morning he denies any complaints, no significant finding on physical exam, good pulses, he denies any complaints currently.  Discharge Exam: Vitals:   05/03/24 0409 05/03/24 0730  BP: 123/75 123/75  Pulse: 66 71  Resp:    Temp: 97.8 F (36.6 C) 97.6 F (36.4 C)  SpO2:  98%   Vitals:   05/02/24 2054 05/03/24 0006 05/03/24 0409 05/03/24 0730  BP:  (!) 140/77 123/75 123/75  Pulse:  69 66 71  Resp:      Temp:  98.2 F (36.8 C) 97.8 F (36.6 C) 97.6 F (36.4 C)  TempSrc:  Oral Oral Oral  SpO2: 98%   98%  Weight:      Height:        General: Pt is alert, awake, frail, deconditioned Cardiovascular: RRR, S1/S2 +, no rubs, no gallops Respiratory: CTA bilaterally, no wheezing, no rhonchi Abdominal: Soft, NT, ND, bowel sounds + Extremities: no edema, no cyanosis, good pulses, no acute findings in both knees or lower extremities.    The results of significant diagnostics from this hospitalization (including imaging, microbiology, ancillary and laboratory) are listed below for reference.     Microbiology: No results found for this or any previous visit (from the past 240 hours).   Labs: BNP (last 3 results) No results for input(s): BNP in the last 8760 hours. Basic Metabolic Panel: Recent Labs  Lab 04/29/24 0317 04/30/24 0433 05/01/24 0253  NA 136  --  139  K 4.2  --  3.8   CL 99  --  103  CO2 27  --  21*  GLUCOSE 124*  --  169*  BUN 26*  --  22  CREATININE 1.75* 1.87* 1.82*  CALCIUM  9.6  --  9.2  MG 1.9  --   --   PHOS  --   --  2.8   Liver Function Tests: No results for input(s): AST, ALT, ALKPHOS, BILITOT, PROT, ALBUMIN in the last 168 hours. No results for input(s): LIPASE, AMYLASE in the last 168 hours. No results for input(s): AMMONIA in the last 168 hours. CBC: Recent Labs  Lab 04/29/24 0317  WBC 7.0  NEUTROABS 4.2  HGB 16.7  HCT 49.3  MCV 82.9  PLT 264   Cardiac Enzymes: No results for input(s): CKTOTAL, CKMB, CKMBINDEX, TROPONINI in the last 168 hours. BNP: Invalid input(s): POCBNP CBG: Recent Labs  Lab 05/01/24 0806 05/01/24 1723 05/02/24 0745 05/02/24 1657 05/03/24 9268  GLUCAP 130* 140* 146* 122* 145*   D-Dimer No results for input(s): DDIMER in the last 72 hours. Hgb A1c No results for input(s): HGBA1C in the last 72 hours. Lipid Profile No results for input(s): CHOL, HDL, LDLCALC, TRIG, CHOLHDL, LDLDIRECT in the last 72 hours. Thyroid  function studies No results for input(s): TSH, T4TOTAL, T3FREE, THYROIDAB in the last 72 hours.  Invalid input(s): FREET3 Anemia work up No results for input(s): VITAMINB12, FOLATE, FERRITIN, TIBC, IRON, RETICCTPCT in the last 72 hours. Urinalysis    Component Value Date/Time   COLORURINE YELLOW 04/23/2024 1930   APPEARANCEUR CLEAR 04/23/2024 1930   LABSPEC 1.015 04/23/2024 1930   PHURINE 5.0 04/23/2024 1930   GLUCOSEU NEGATIVE 04/23/2024 1930   HGBUR NEGATIVE 04/23/2024 1930   BILIRUBINUR NEGATIVE 04/23/2024 1930   KETONESUR NEGATIVE 04/23/2024 1930   PROTEINUR NEGATIVE 04/23/2024 1930   NITRITE NEGATIVE 04/23/2024 1930   LEUKOCYTESUR NEGATIVE 04/23/2024 1930   Sepsis Labs Recent Labs  Lab 04/29/24 0317  WBC 7.0   Microbiology No results found for this or any previous visit (from the past 240  hours).   Time coordinating discharge: Over 30 minutes  SIGNED:   Brayton Lye, MD  [facility] 05/03/2024, 9:43 AM Pager   If 7PM-7AM, please contact night-coverage [email or web address]

## 2024-05-03 NOTE — Progress Notes (Signed)
 Occupational Therapy Treatment Patient Details Name: [name] MRN: [id number] DOB: 1950-08-17 Today's Date: 05/03/2024   History of present illness [name] is a 73 y.o. male presented 04/23/24 from [facility] to ED with worsening tremors, increased confusion, right eye looking different. MRI and CT -negative for acute event. 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 Min assist to don fresh gown, max assist for socks due to fatigue. Pt completed oral care and combed hair at sink in sitting with set up. Mod assist to don back brace. Pt ambulated in room with min assist, particularly in tight spaces, and RW. Left up in chair with all needs met. Pt reports he will discharge later today to rehab.       If plan is discharge home, recommend the following:  A little help with walking and/or transfers;A little help with bathing/dressing/bathroom;Assistance with cooking/housework;Direct supervision/assist for medications management;Direct supervision/assist for financial management;Assist for transportation;Help with stairs or ramp for entrance;Supervision due to cognitive status   Equipment Recommendations  Other (comment) (defer)    Recommendations for Other Services      Precautions / Restrictions Precautions Precautions: Fall;Back Required Braces or Orthoses: Spinal Brace Spinal Brace: Thoracolumbosacral orthotic;Applied in sitting position Restrictions Weight Bearing Restrictions Per Provider Order: No       Mobility Bed Mobility Overal bed mobility: Needs Assistance Bed Mobility: Supine to Sit     Supine to sit: Supervision     General bed mobility comments: HOB almost completely upright    Transfers Overall transfer level: Needs assistance Equipment used: Rolling walker (2 wheels) Transfers: Sit to/from  Stand Sit to Stand: Contact guard assist           General transfer comment: increased time, cues for hand placement     Balance Overall balance assessment: Needs assistance Sitting-balance support: Feet supported Sitting balance-Leahy Scale: Good     Standing balance support: Bilateral upper extremity supported, During functional activity, No upper extremity supported Standing balance-Leahy Scale: Poor                             ADL either performed or assessed with clinical judgement   ADL Overall ADL's : Needs assistance/impaired     Grooming: Wash/dry hands;Wash/dry face;Oral care;Sitting;Set up;Brushing hair   Upper Body Bathing: Minimal assistance;Sitting   Lower Body Bathing: Sit to/from stand;Minimal assistance   Upper Body Dressing : Set up;Sitting   Lower Body Dressing: Maximal assistance;Sitting/lateral leans           Tub/ Shower Transfer: Walk-in shower;Minimal assistance;Ambulation;BSC/3in1;Grab bars   Functional mobility during ADLs: Minimal assistance;Rolling walker (2 wheels) General ADL Comments: needs help guiding walker in tight spaces    Extremity/Trunk Assessment              Vision       Perception     Praxis     Communication Communication Communication: Impaired Factors Affecting Communication: Hearing impaired   Cognition Arousal: Alert Behavior During Therapy: WFL for tasks assessed/performed Cognition: Cognition impaired     Awareness: Intellectual awareness intact, Online awareness impaired Memory impairment (select all impairments): Short-term memory Attention impairment (select first level of impairment): Selective attention Executive functioning impairment (select all impairments): Initiation, Organization, Problem solving OT - Cognition Comments: pt conversive and following  commands with increased time                 Following commands: Impaired Following commands impaired: Follows one step  commands with increased time      Cueing   Cueing Techniques: Verbal cues  Exercises      Shoulder Instructions       General Comments      Pertinent Vitals/ Pain       Pain Assessment Pain Assessment: No/denies pain  Home Living                                          Prior Functioning/Environment              Frequency  Min 2X/week        Progress Toward Goals  OT Goals(current goals can now be found in the care plan section)  Progress towards OT goals: Progressing toward goals  Acute Rehab OT Goals OT Goal Formulation: With patient Time For Goal Achievement: 05/08/24 Potential to Achieve Goals: Fair  Plan      Co-evaluation                 AM-PAC OT 6 Clicks Daily Activity     Outcome Measure   Help from another person eating meals?: None Help from another person taking care of personal grooming?: A Little Help from another person toileting, which includes using toliet, bedpan, or urinal?: A Little Help from another person bathing (including washing, rinsing, drying)?: A Little Help from another person to put on and taking off regular upper body clothing?: A Little Help from another person to put on and taking off regular lower body clothing?: A Lot 6 Click Score: 18    End of Session    OT Visit Diagnosis: Unsteadiness on feet (R26.81);Other abnormalities of gait and mobility (R26.89);Repeated falls (R29.6);History of falling (Z91.81);Other symptoms and signs involving the nervous system (R29.898)   Activity Tolerance Patient tolerated treatment well   Patient Left in chair;with call bell/phone within reach;with chair alarm set   Nurse Communication Other (comment) (ok to help pt shower)        Time: 9054-8972 OT Time Calculation (min): 42 min  Charges: OT General Charges $OT Visit: 1 Visit OT Treatments $Self Care/Home Management : 38-52 mins  Mliss HERO, OTR/L Acute Rehabilitation Services Office:  [phone]  Kennth Mliss Helling 05/03/2024, 10:39 AM

## 2024-05-03 NOTE — TOC Progression Note (Signed)
 Transition of Care [facility]) - Progression Note    Patient Details  Name: [name] MRN: [id number] Date of Birth: [birth year]  [facility] CM/SW Contact  Inocente GORMAN Kindle, LCSW Phone Number: 05/03/2024, 9:40 AM  Clinical Narrative:    8:39am-CSW received call from Amy with [facility] appeals. She stated that appeal has been overturned and just awaiting for details to be published. CSW updated Emm[facility] and confirmed they have a bed today.   9:43 AM-CSW received call from Crystal with [facility] stating appeal has been overturned. SNF approval is [id number] for 7 days.   CSW updated patient's wife who expressed gratitude. She does request [facility] for transport.    Expected Discharge Plan: Skilled Nursing Facility Barriers to Discharge: Barriers Resolved               Expected Discharge Plan and Services In-house Referral: Clinical Social Work   Post Acute Care Choice: Skilled Nursing Facility Living arrangements for the past 2 months: Single Family Home Expected Discharge Date: 04/26/24                                     Social Drivers of Health (SDOH) Interventions SDOH Screenings   Food Insecurity: No Food Insecurity (04/24/2024)  Housing: Low Risk  (04/24/2024)  Transportation Needs: Unmet Transportation Needs (04/24/2024)  Utilities: Not At Risk (04/24/2024)  Depression (PHQ2-9): Low Risk  (04/12/2023)  Financial Resource Strain: Low Risk  (09/28/2023)   Received from [facility]  Social Connections: Moderately Integrated (04/24/2024)  Tobacco Use: Low Risk  (04/23/2024)    Readmission Risk Interventions    04/10/2024   12:44 PM  Readmission Risk Prevention Plan  Post Dischage Appt Complete  Medication Screening Complete  Transportation Screening Complete

## 2024-05-03 NOTE — Progress Notes (Signed)
 Dr. Charlton informed that patient is complaining of knee pain with noticeable coldness when touched, upper thighs and legs are both warm to touch bilaterally, pedal pulses are palpable as well, offered allopurinol  but patient refused at the moment, warm packs applied on both knees.

## 2024-05-13 ENCOUNTER — Other Ambulatory Visit: Payer: Self-pay | Admitting: Family Medicine

## 2024-05-13 NOTE — Telephone Encounter (Unsigned)
 Copied from CRM #[id number]. Topic: Clinical - Medication Refill >> May 13, 2024  9:13 AM Tobias L wrote: Medication: carbidopa -levodopa  (SINEMET  IR) 25-100 MG tablet Patient was prescribed this by hospital, Wife requesting Dr. Aletha to take over prescription and continue refills. Wife also scheduled hospital f/u with Dr. Aletha on 05/15/2024. Only has one tablet left and requesting refill today if possible.   Has the patient contacted their pharmacy? Yes Told to contact PCP out of refills.  This is the patient's preferred pharmacy:  [facility] [id number] - [place], [place] - [address] [place] [address] Phone: [phone] Fax: [phone]  Is this the correct pharmacy for this prescription? Yes  Has the prescription been filled recently? No  Is the patient out of the medication? Yes, will be out today. Only has one pill left.   Has the patient been seen for an appointment in the last year OR does the patient have an upcoming appointment? Yes  Can we respond through MyChart? No  Agent: Please be advised that Rx refills may take up to 3 business days. We ask that you follow-up with your pharmacy.

## 2024-05-14 NOTE — Telephone Encounter (Signed)
 Requested medication (s) are due for refill today - unsure  Requested medication (s) are on the active medication list -yes  Future visit scheduled -yes  Last refill: 05/03/24  Notes to clinic: outside provider: Patient was prescribed this by hospital, Wife requesting Dr. Aletha to take over prescription and continue refills. Wife also scheduled hospital f/u with Dr. Aletha on 05/15/2024. Only has one tablet left and requesting refill today if possible.   Requested Prescriptions  Pending Prescriptions Disp Refills   carbidopa -levodopa  (SINEMET  IR) 25-100 MG tablet      Sig: Take 1 tablet by mouth 3 (three) times daily.     Neurology:  Parkinsonian Agents 2 Failed - 05/14/2024 11:37 AM      Failed - Cr in normal range and within 360 days    Creat  Date Value Ref Range Status  12/06/2023 1.64 (H) 0.70 - 1.28 mg/dL Final   Creatinine, Ser  Date Value Ref Range Status  05/01/2024 1.82 (H) 0.61 - 1.24 mg/dL Final   Creatinine, Urine  Date Value Ref Range Status  01/12/2023 57 20 - 320 mg/dL Final         Passed - WBC in normal range and within 360 days    WBC  Date Value Ref Range Status  04/29/2024 7.0 4.0 - 10.5 K/uL Final         Passed - PLT in normal range and within 360 days    Platelets  Date Value Ref Range Status  04/29/2024 264 150 - 400 K/uL Final         Passed - HGB in normal range and within 360 days    Hemoglobin  Date Value Ref Range Status  04/29/2024 16.7 13.0 - 17.0 g/dL Final         Passed - HCT in normal range and within 360 days    HCT  Date Value Ref Range Status  04/29/2024 49.3 39.0 - 52.0 % Final         Passed - AST in normal range and within 360 days    AST  Date Value Ref Range Status  04/25/2024 20 15 - 41 U/L Final         Passed - ALT in normal range and within 360 days    ALT  Date Value Ref Range Status  04/25/2024 6 0 - 44 U/L Final         Passed - Last BP in normal range    BP Readings from Last 1 Encounters:   05/03/24 114/82         Passed - Valid encounter within last 12 months    Recent Outpatient Visits           2 months ago Primary hypertension   [place] [facility] Aletha Bene, MD   5 months ago Primary hypertension   [place] [facility] Aletha Bene, MD                 Requested Prescriptions  Pending Prescriptions Disp Refills   carbidopa -levodopa  (SINEMET  IR) 25-100 MG tablet      Sig: Take 1 tablet by mouth 3 (three) times daily.     Neurology:  Parkinsonian Agents 2 Failed - 05/14/2024 11:37 AM      Failed - Cr in normal range and within 360 days    Creat  Date Value Ref Range Status  12/06/2023 1.64 (H) 0.70 - 1.28 mg/dL Final   Creatinine, Ser  Date Value Ref Range Status  05/01/2024 1.82 (H) 0.61 - 1.24 mg/dL Final   Creatinine, Urine  Date Value Ref Range Status  01/12/2023 57 20 - 320 mg/dL Final         Passed - WBC in normal range and within 360 days    WBC  Date Value Ref Range Status  04/29/2024 7.0 4.0 - 10.5 K/uL Final         Passed - PLT in normal range and within 360 days    Platelets  Date Value Ref Range Status  04/29/2024 264 150 - 400 K/uL Final         Passed - HGB in normal range and within 360 days    Hemoglobin  Date Value Ref Range Status  04/29/2024 16.7 13.0 - 17.0 g/dL Final         Passed - HCT in normal range and within 360 days    HCT  Date Value Ref Range Status  04/29/2024 49.3 39.0 - 52.0 % Final         Passed - AST in normal range and within 360 days    AST  Date Value Ref Range Status  04/25/2024 20 15 - 41 U/L Final         Passed - ALT in normal range and within 360 days    ALT  Date Value Ref Range Status  04/25/2024 6 0 - 44 U/L Final         Passed - Last BP in normal range    BP Readings from Last 1 Encounters:  05/03/24 114/82         Passed - Valid encounter within last 12 months    Recent Outpatient Visits           2 months ago  Primary hypertension   [place] [facility] Aletha Bene, MD   5 months ago Primary hypertension   [place] [facility] Aletha Bene, MD

## 2024-05-15 ENCOUNTER — Ambulatory Visit (INDEPENDENT_AMBULATORY_CARE_PROVIDER_SITE_OTHER): Admitting: Family Medicine

## 2024-05-15 ENCOUNTER — Encounter: Payer: Self-pay | Admitting: Family Medicine

## 2024-05-15 VITALS — BP 122/62 | HR 61 | Temp 98.2°F | Ht 66.0 in | Wt 151.0 lb

## 2024-05-15 DIAGNOSIS — R351 Nocturia: Secondary | ICD-10-CM

## 2024-05-15 DIAGNOSIS — E1122 Type 2 diabetes mellitus with diabetic chronic kidney disease: Secondary | ICD-10-CM

## 2024-05-15 DIAGNOSIS — G3184 Mild cognitive impairment, so stated: Secondary | ICD-10-CM

## 2024-05-15 DIAGNOSIS — N1832 Chronic kidney disease, stage 3b: Secondary | ICD-10-CM

## 2024-05-15 DIAGNOSIS — E1169 Type 2 diabetes mellitus with other specified complication: Secondary | ICD-10-CM

## 2024-05-15 DIAGNOSIS — R251 Tremor, unspecified: Secondary | ICD-10-CM

## 2024-05-15 DIAGNOSIS — Z23 Encounter for immunization: Secondary | ICD-10-CM

## 2024-05-15 DIAGNOSIS — G9341 Metabolic encephalopathy: Secondary | ICD-10-CM

## 2024-05-15 DIAGNOSIS — I10 Essential (primary) hypertension: Secondary | ICD-10-CM

## 2024-05-15 MED ORDER — DONEPEZIL HCL 5 MG PO TABS: 5.0000 mg | ORAL_TABLET | Freq: Every morning | ORAL | 1 refills | Status: AC

## 2024-05-15 MED ORDER — TAMSULOSIN HCL 0.4 MG PO CAPS: 0.4000 mg | ORAL_CAPSULE | Freq: Every day | ORAL | 1 refills | Status: AC

## 2024-05-15 MED ORDER — EZETIMIBE 10 MG PO TABS: 10.0000 mg | ORAL_TABLET | Freq: Every day | ORAL | 1 refills | Status: AC

## 2024-05-15 MED ORDER — CARBIDOPA-LEVODOPA 25-100 MG PO TABS: 1.0000 | ORAL_TABLET | Freq: Three times a day (TID) | ORAL | 1 refills | Status: DC

## 2024-05-15 MED ORDER — ONDANSETRON 4 MG PO TBDP: 4.0000 mg | ORAL_TABLET | Freq: Three times a day (TID) | ORAL | 0 refills | Status: AC | PRN

## 2024-05-15 NOTE — Progress Notes (Signed)
 Patient Office Visit  Assessment & Plan:  Acute metabolic encephalopathy  Primary hypertension -     CBC with Differential/Platelet -     Comprehensive metabolic panel with GFR  Mild neurocognitive disorder -     Donepezil  HCl; Take 1 tablet (5 mg total) by mouth in the morning.  Dispense: 90 tablet; Refill: 1 -     Ambulatory referral to Neurology  Type 2 diabetes mellitus with stage 3b chronic kidney disease, without long-term current use of insulin  (HCC) -     Hemoglobin A1c  CKD stage 3b, GFR 30-44 ml/min (HCC) -     Comprehensive metabolic panel with GFR  Hyperlipidemia associated with type 2 diabetes mellitus (HCC) -     Ezetimibe ; Take 1 tablet (10 mg total) by mouth at bedtime. TAKE 1 TABLET BY MOUTH DAILY FOR CHOLESTEROL  Dispense: 90 tablet; Refill: 1 -     Lipid panel  Nocturia -     Tamsulosin  HCl; Take 1 capsule (0.4 mg total) by mouth at bedtime. Help with urination  Dispense: 90 capsule; Refill: 1  Tremor -     Ambulatory referral to Neurology  Needs flu shot -     Flu vaccine HIGH DOSE PF(Fluzone Trivalent)  Other orders -     Ondansetron ; Take 1 tablet (4 mg total) by mouth every 8 (eight) hours as needed for nausea or vomiting.  Dispense: 30 tablet; Refill: 0   Assessment and Plan    Encounter for immunization Recommended flu shot due to recent hospitalization and upcoming Thanksgiving gatherings. - Administered flu shot.  Type 2 diabetes mellitus with stage 3b chronic kidney disease Kidney function requires monitoring. Advised against Aleve due to renal impact. - Ordered repeat blood work to monitor kidney function. - Recommended extra strength Tylenol  for pain management instead of Aleve.  Early Parkinson's disease Sinemet  360 improved symptoms. Referral needed for further management. - Continue Sinemet  360. - Referred to Dr. Asberry Jacob Tatt for further management of Parkinson's disease.  Mild cognitive impairment Cognitive function was  impacted during hospitalization but improved after medication changes. Continuing current medication regimen. - Continue Aricept  and Namenda .  Acute compression fracture of L1 and chronic thoracic fracture (T12) Pain management ongoing. Orthopedic evaluation scheduled. - Continue current pain management regimen. - Attend orthopedic appointment on December 8th for further evaluation.     Neurology consult ordered. Follow up on lab work and notify patient Return in about 2 months (around 07/15/2024), or if symptoms worsen or fail to improve.   Subjective:    Patient ID: [name], male    DOB: 1950/12/24  Age: 73 y.o. MRN: [id number]  Chief Complaint  Patient presents with   Hospitalization Follow-up    Here with wife. Pt recently completed short term rehab at [facility].     HPI Discussed the use of AI scribe software for clinical note transcription with the patient, who gave verbal consent to proceed.  History of Present Illness       History of Present Illness [name] is a 73 year old male with early-stage Parkinson's disease who presents for follow-up after recent hospitalization at [facility] from 04/23/24 to 05/03/24 and rehabilitation.  He was hospitalized from November 4th to the 14th due to concerns of a stroke, which was later attributed to medication overuse. His pain meds were stopped- tramadol , gabapentin  and vicodin. During this stay, he was diagnosed with early-stage Parkinson's disease. His medications were adjusted, and he is currently  on Sinemet  TID, which has significantly improved his symptoms, including tremors.  He experienced confusion and balance issues, initially thought to be due to a stroke but later attributed to medication side effects. His medications, including gabapentin , Robaxin, and tramadol , were discontinued. He now takes Aleve as needed for pain, which is monitored due to its potential impact on kidney function.  He has a history of  kidney function abnormalities, and repeat blood work, including kidney function tests and a CBC, has been recommended. His wife reports that his kidney function has been 'off for a while.'  He has experienced significant tremors affecting his entire body, described as feeling like 'a little earthquake.' These have improved with Sinemet , which he takes three times daily. His wife notes that he is more stable and clear-minded since the medication adjustment.  He has a history of falls, one of which resulted in a compression fracture of the L1 vertebra and a chronic fracture of the T12 vertebra. He is currently wearing a support brace. His wife reports that he has been experiencing significant back pain since the falls.  He continues to take Aricept  and Namenda  for memory support. His wife ensures he eats before taking his morning medications to prevent nausea, and he keeps sublingual nausea medication on hand as a precaution.  Results RADIOLOGY MRI: No acute stroke X-ray (04/07/2024): Acute compression fracture of L1, chronic fracture of T12  Assessment and Plan Encounter for immunization Recommended flu shot due to recent hospitalization and upcoming Thanksgiving gatherings. - Administered flu shot.  Type 2 diabetes mellitus with stage 3b chronic kidney disease Kidney function requires monitoring. Advised against Aleve due to renal impact. - Ordered repeat blood work to monitor kidney function. - Recommended extra strength Tylenol  for pain management instead of Aleve.  Early Parkinson's disease Sinemet  360 improved symptoms. Referral needed for further management. - Continue Sinemet  360. - Referred to Dr. Asberry Jacob Tatt for further management of Parkinson's disease.  Mild cognitive impairment Cognitive function was impacted during hospitalization but improved after medication changes. Continuing current medication regimen. - Continue Aricept  and Namenda .  Acute compression fracture  of L1 and chronic thoracic fracture (T12) Pain management ongoing. Orthopedic evaluation scheduled. - Continue current pain management regimen. - Attend orthopedic appointment on December 8th for further evaluation. Reviewed hospital notes and discharge summary- 73 y.o. male with medical history significant for type 2 diabetes, peripheral neuropathy, hyperlipidemia, hypertension, chronic tremors, mild cognitive impairment, parkinsonian features, who presents to the ER due to worsening tremors for the past 2 days.  Associated with some confusion.  His wife was concerned about possible right facial droop.   In the ER, the patient was seen by neurology/stroke team.  CT head without contrast was nonacute.  MRI brain was negative for acute CVA.  Confusion felt most likely due to medications, please see discussion below    Acute metabolic encephalopathy, POA:   -CT head followed by MRI brain unremarkable.  Patient is fully alert and oriented and back to baseline now.  On gabapentin , scheduled tramadol  and as needed Vicodin, as well scheduled Robaxin, this all will contribute to his confusion, this all has been discontinued during hospital stay, mentation back to baseline , I will hold all these meds at time of discharge as he did well during hospital stay without any of these meds.   AKI on CKD 3B, suspect prerenal  - Baseline creatinine appears to be between 1.5-1.8 with GFR 47, Presented with creatinine of 2.0 which has improved  to 1.82.    Hypertension:  -Initially all meds has been held, lisinopril has been discontinued due to worsening renal function, dehydration and baseline renal insufficiency. -Started on low-dose Coreg  with good control.   Chronic tremors, worsening:  -Tremors first noted maybe about 2 years ago, they were rare and not really that bothersome. They worsened about 3 weeks ago while he was at [facility] but significantly improved with sinemet  and reducing gabapentin  and opiod. He was  discharged to rehab where he was doing better. Over the last week, would notice brief tremors/jerking that would wake him up at night. Also poor po intake at rehab and not drinking enough water.  Seen by neurology, holding gabapentin  per their recommendations and continuing current dose of Sinemet .  Neurology postdischarge will follow-up with Dr. Evonnie, patient's personal neurologist has left the practice according to the wife.   Severe hypophosphatemia: Replaced and stable   BPH  Resume home tamsulosin    Constipation: On bowel regimen during hospital stay, resolved   Generalized weakness PT OT evaluation Plan for SNF placement.      The 10-year ASCVD risk score (Arnett DK, et al., 2019) is: 38.7%  Past Medical History:  Diagnosis Date   Allergy    Bone spur    Left Shoulder   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 (HCC) 07/18/2017   Fracture    right forearm   Gout    Hyperlipidemia    Hypertension    Other testicular hypofunction    PONV (postoperative nausea and vomiting)    Type II or unspecified type diabetes mellitus without mention of complication, not stated as uncontrolled    Past Surgical History:  Procedure Laterality Date   CATARACT EXTRACTION Left 08/2023   CYST REMOVAL HAND     ORIF RADIAL FRACTURE Right 07/18/2019   Procedure: OPEN REDUCTION INTERNAL FIXATION (ORIF) RADIUS AND ULNA FRACTURE;  Surgeon: Kendal Franky SQUIBB, MD;  Location: [facility] OR;  Service: Orthopedics;  Laterality: Right;   PROSTATE BIOPSY  2010   WRIST SURGERY Right 1998   Social History   Tobacco Use   Smoking status: Never   Smokeless tobacco: Never  Vaping Use   Vaping status: Never Used  Substance Use Topics   Alcohol  use: Not Currently    Comment: maybe 2-3 beers in a year   Drug use: No   Family History  Problem Relation Age of Onset   Heart disease Mother 30       stent   Heart disease Father    Kidney disease Father    Heart failure Father    Alzheimer's disease Paternal  Grandfather    Colon cancer Neg Hx    Esophageal cancer Neg Hx    Liver cancer Neg Hx    Pancreatic cancer Neg Hx    Rectal cancer Neg Hx    Stomach cancer Neg Hx    Allergies  Allergen Reactions   Tape Other (See Comments)    SKIN IS VERY THIN. IT TEARS AND BRUISES VERY EASILY!!   Crestor [Rosuvastatin] Diarrhea   Penicillins Hives, Nausea And Vomiting, Swelling and Other (See Comments)    Severe hives and possible swelling of the throat   Welchol [Colesevelam Hcl] Diarrhea   Zoloft [Sertraline Hcl] Other (See Comments)    Reaction not recalled     ROS    Objective:    BP 122/62   Pulse 61   Temp 98.2 F (36.8 C)   Ht 5' 6 (  1.676 m)   Wt 151 lb (68.5 kg)   SpO2 99%   BMI 24.37 kg/m  BP Readings from Last 3 Encounters:  05/15/24 122/62  05/03/24 114/82  04/10/24 (!) 161/80   Wt Readings from Last 3 Encounters:  05/15/24 151 lb (68.5 kg)  04/23/24 147 lb (66.7 kg)  04/10/24 157 lb 13.6 oz (71.6 kg)    Physical Exam Vitals and nursing note reviewed.  Constitutional:      General: He is not in acute distress.    Appearance: Normal appearance.     Comments: Comes in with his wife  HENT:     Head: Normocephalic.     Right Ear: Tympanic membrane, ear canal and external ear normal.     Left Ear: Tympanic membrane, ear canal and external ear normal.  Eyes:     Extraocular Movements: Extraocular movements intact.     Pupils: Pupils are equal, round, and reactive to light.  Cardiovascular:     Rate and Rhythm: Normal rate and regular rhythm.     Heart sounds: Normal heart sounds.  Pulmonary:     Effort: Pulmonary effort is normal.     Breath sounds: Normal breath sounds.     Comments: Patient wearing a vest over upper torso.  Musculoskeletal:     Right lower leg: No edema.     Left lower leg: No edema.  Neurological:     General: No focal deficit present.     Mental Status: He is alert and oriented to person, place, and time. Mental status is at baseline.   Psychiatric:        Mood and Affect: Mood normal.        Behavior: Behavior normal.        Thought Content: Thought content normal.      No results found for any visits on 05/15/24.

## 2024-05-16 LAB — CBC WITH DIFFERENTIAL/PLATELET
Absolute Lymphocytes: 1205 {cells}/uL (ref 850–3900)
Absolute Monocytes: 599 {cells}/uL (ref 200–950)
Basophils Absolute: 90 {cells}/uL (ref 0–200)
Basophils Relative: 1.1 %
Eosinophils Absolute: 402 {cells}/uL (ref 15–500)
Eosinophils Relative: 4.9 %
HCT: 47.1 % (ref 39.4–51.1)
Hemoglobin: 15.6 g/dL (ref 13.2–17.1)
MCH: 28.2 pg (ref 27.0–33.0)
MCHC: 33.1 g/dL (ref 31.6–35.4)
MCV: 85 fL (ref 81.4–101.7)
MPV: 9.4 fL (ref 7.5–12.5)
Monocytes Relative: 7.3 %
Neutro Abs: 5904 {cells}/uL (ref 1500–7800)
Neutrophils Relative %: 72 %
Platelets: 293 Thousand/uL (ref 140–400)
RBC: 5.54 Million/uL (ref 4.20–5.80)
RDW: 13 % (ref 11.0–15.0)
Total Lymphocyte: 14.7 %
WBC: 8.2 Thousand/uL (ref 3.8–10.8)

## 2024-05-16 LAB — COMPREHENSIVE METABOLIC PANEL WITH GFR
AG Ratio: 2.5 (calc) (ref 1.0–2.5)
ALT: 8 U/L — ABNORMAL LOW (ref 9–46)
AST: 17 U/L (ref 10–35)
Albumin: 4.5 g/dL (ref 3.6–5.1)
Alkaline phosphatase (APISO): 86 U/L (ref 35–144)
BUN/Creatinine Ratio: 14 (calc) (ref 6–22)
BUN: 20 mg/dL (ref 7–25)
CO2: 28 mmol/L (ref 20–32)
Calcium: 9.6 mg/dL (ref 8.6–10.3)
Chloride: 104 mmol/L (ref 98–110)
Creat: 1.46 mg/dL — ABNORMAL HIGH (ref 0.70–1.28)
Globulin: 1.8 g/dL — ABNORMAL LOW (ref 1.9–3.7)
Glucose, Bld: 92 mg/dL (ref 65–99)
Potassium: 4.4 mmol/L (ref 3.5–5.3)
Sodium: 141 mmol/L (ref 135–146)
Total Bilirubin: 0.6 mg/dL (ref 0.2–1.2)
Total Protein: 6.3 g/dL (ref 6.1–8.1)
eGFR: 50 mL/min/1.73m2 — ABNORMAL LOW (ref >=60)

## 2024-05-16 LAB — LIPID PANEL
Cholesterol: 182 mg/dL (ref <200)
HDL: 47 mg/dL (ref >=40)
LDL Cholesterol (Calc): 110 mg/dL — ABNORMAL HIGH
Non-HDL Cholesterol (Calc): 135 mg/dL — ABNORMAL HIGH (ref <130)
Total CHOL/HDL Ratio: 3.9 (calc) (ref <5.0)
Triglycerides: 131 mg/dL (ref <150)

## 2024-05-16 LAB — HEMOGLOBIN A1C
Hgb A1c MFr Bld: 6.6 % — ABNORMAL HIGH (ref <5.7)
Mean Plasma Glucose: 143 mg/dL
eAG (mmol/L): 7.9 mmol/L

## 2024-05-17 ENCOUNTER — Ambulatory Visit: Payer: Self-pay | Admitting: Family Medicine

## 2024-05-21 NOTE — Telephone Encounter (Signed)
 Copied from CRM [id number]. Topic: Referral - Status >> May 21, 2024  9:51 AM Zebedee SAUNDERS wrote: Reason for CRM: Pt's wife Ralph,[name] [phone] called on behalf of pt needs referral#[id number] redirected to [facility].

## 2024-05-23 NOTE — Telephone Encounter (Signed)
 Hey this is [name] with [facility]. Just wanted to reach out on behalf of the patient as his wife has called in a couple of times about the referral supposed to be sent to our office. It looks like the referral was sent to [facility] and has not been re-routed to us . We are not able to work on this until it's routed to our office. Thank you

## 2024-05-27 ENCOUNTER — Ambulatory Visit: Payer: Self-pay

## 2024-05-27 NOTE — Telephone Encounter (Signed)
 FYI Only or Action Required?: FYI only for provider: appointment scheduled on weds - pt unable to come in tomorrow.  Patient was last seen in primary care on 05/15/2024 by Aletha Bene, MD.  Called Nurse Triage reporting Foot Injury. PT has had a bunion on right foot for a long time since last in office. This bunion has become swollen and red and there is a darkness beneath the bunion  Symptoms began a week ago.  Interventions attempted: Nothing.  Symptoms are: gradually worsening.  Triage Disposition: See Physician Within 24 Hours - scheduled for weds as pt cannot get in earlier.  Patient/caregiver understands and will follow disposition?: Yes                      Copied from CRM #[id number]. Topic: Clinical - Red Word Triage >> May 27, 2024  4:47 PM Tinnie BROCKS wrote: Red Word that prompted transfer to Nurse Triage: Wife on dpr [employer] on the line. Bunion on right foot that is red and inflamed with a dark spot, states it is not a bruise. Thinks he might need a podiatrist. Reason for Disposition  [1] Redness of the skin AND [2] no fever  Answer Assessment - Initial Assessment Questions 1. ONSET: When did the pain start?      05/15/2024 2. LOCATION: Where is the pain located?      Right foot -  3. PAIN: How bad is the pain?    (Scale 1-10; or mild, moderate, severe)     Red swollen 4. WORK OR EXERCISE: Has there been any recent work or exercise that involved this part of the body?      no 5. CAUSE: What do you think is causing the foot pain?     Unsure 6. OTHER SYMPTOMS: Do you have any other symptoms? (e.g., leg pain, rash, fever, numbness)     Black spot under the bunion  Protocols used: Foot Pain-A-AH

## 2024-05-28 ENCOUNTER — Ambulatory Visit: Admitting: Diagnostic Neuroimaging

## 2024-05-28 ENCOUNTER — Encounter: Payer: Self-pay | Admitting: Diagnostic Neuroimaging

## 2024-05-28 VITALS — BP 130/66 | HR 62 | Ht 66.0 in | Wt 153.2 lb

## 2024-05-28 DIAGNOSIS — G252 Other specified forms of tremor: Secondary | ICD-10-CM

## 2024-05-28 DIAGNOSIS — G250 Essential tremor: Secondary | ICD-10-CM

## 2024-05-28 DIAGNOSIS — R269 Unspecified abnormalities of gait and mobility: Secondary | ICD-10-CM

## 2024-05-28 DIAGNOSIS — G301 Alzheimer's disease with late onset: Secondary | ICD-10-CM

## 2024-05-28 MED ORDER — CARBIDOPA-LEVODOPA 25-100 MG PO TABS: 1.0000 | ORAL_TABLET | Freq: Three times a day (TID) | ORAL | 4 refills | Status: AC

## 2024-05-28 NOTE — Progress Notes (Signed)
 [facility]  PATIENT: [name] DOB: [birth year]  REFERRING CLINICIAN: Aletha Bene, MD HISTORY FROM: patient  REASON FOR VISIT: new consult   HISTORICAL  CHIEF COMPLAINT:  Chief Complaint  Patient presents with   RM 7     Patient is here with for mild neurocognitive disorder  and tremors - tremors are about the same unless, fatigued, dehydrated has been making sure to prevent   Not currently on memantine  or gabapentin  was taken off by the dr. That discovered the parkinson's     HISTORY OF PRESENT ILLNESS:   73 year old male here for evaluation of memory loss and tremors.  Patient had onset of confusion and short-term memory difficulties starting around 2023.  Underwent extensive testing over the last 1 to 2 years with progressive symptoms, abnormal ATN panel, neuropsychology testing with signs and symptoms most consistent with neurodegenerative dementia of Alzheimer's type.  Patient also has had progressive gait and balance difficulty over time.  Was having some intermittent postural tremors which have worsened over time.  Initially tried on primidone  which did not help.  DAT scan was negative for parkinsonism conditions.  In October 2025 patient fell down and symptoms significantly worsened.  In the hospital he was empirically started on carbidopa  levodopa  which seem to help his tremors.  Now patient back at home.  Symptoms are stable.  He is tolerating medications.   REVIEW OF SYSTEMS: Full 14 system review of systems performed and negative with exception of: as per HPI.  ALLERGIES: Allergies  Allergen Reactions   Tape Other (See Comments)    SKIN IS VERY THIN. IT TEARS AND BRUISES VERY EASILY!!   Crestor [Rosuvastatin] Diarrhea   Penicillins Hives, Nausea And Vomiting, Swelling and Other (See Comments)    Severe hives and possible swelling of the throat   Welchol [Colesevelam Hcl] Diarrhea   Zoloft [Sertraline Hcl] Other (See Comments)     Reaction not recalled     HOME MEDICATIONS: Outpatient Medications Prior to Visit  Medication Sig Dispense Refill   allopurinol  (ZYLOPRIM ) 300 MG tablet Take 300 mg by mouth daily as needed (for gout flares).     carvedilol  (COREG ) 3.125 MG tablet Take 1 tablet (3.125 mg total) by mouth 2 (two) times daily with a meal.     Cholecalciferol  (VITAMIN D3) 125 MCG (5000 UT) TABS Take 5,000 Units by mouth daily.     donepezil  (ARICEPT ) 5 MG tablet Take 1 tablet (5 mg total) by mouth in the morning. 90 tablet 1   ezetimibe  (ZETIA ) 10 MG tablet Take 1 tablet (10 mg total) by mouth at bedtime. TAKE 1 TABLET BY MOUTH DAILY FOR CHOLESTEROL 90 tablet 1   Magnesium  250 MG TABS Take 250 mg by mouth daily.     melatonin 5 MG TABS Take 1 tablet (5 mg total) by mouth at bedtime as needed.     polyethylene glycol (MIRALAX  / GLYCOLAX ) 17 g packet Take 17 g by mouth daily.     tamsulosin  (FLOMAX ) 0.4 MG CAPS capsule Take 1 capsule (0.4 mg total) by mouth at bedtime. Help with urination 90 capsule 1   carbidopa -levodopa  (SINEMET  IR) 25-100 MG tablet Take 1 tablet by mouth 3 (three) times daily. 90 tablet 1   acetaminophen  (TYLENOL ) 500 MG tablet Take 1 tablet (500 mg total) by mouth every 8 (eight) hours as needed. (Patient not taking: Reported on 05/28/2024)     aspirin  EC 81 MG tablet Take 81 mg by mouth 2 (two) times  a week. Swallow whole. Take on Mon and Thurs (Patient not taking: Reported on 05/28/2024)     ondansetron  (ZOFRAN -ODT) 4 MG disintegrating tablet Take 1 tablet (4 mg total) by mouth every 8 (eight) hours as needed for nausea or vomiting. (Patient not taking: Reported on 05/28/2024) 30 tablet 0   memantine  (NAMENDA  XR) 28 MG CP24 24 hr capsule Take 1 capsule (28 mg total) by mouth daily. 90 capsule 4   No facility-administered medications prior to visit.    PAST MEDICAL HISTORY: Past Medical History:  Diagnosis Date   Allergy    Bone spur    Left Shoulder   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  mellitus (HCC) 07/18/2017   Fracture    right forearm   Gout    Hyperlipidemia    Hypertension    Other testicular hypofunction    PONV (postoperative nausea and vomiting)    Type II or unspecified type diabetes mellitus without mention of complication, not stated as uncontrolled     PAST SURGICAL HISTORY: Past Surgical History:  Procedure Laterality Date   CATARACT EXTRACTION Left 08/2023   CYST REMOVAL HAND     ORIF RADIAL FRACTURE Right 07/18/2019   Procedure: OPEN REDUCTION INTERNAL FIXATION (ORIF) RADIUS AND ULNA FRACTURE;  Surgeon: Kendal Franky SQUIBB, MD;  Location: [facility] OR;  Service: Orthopedics;  Laterality: Right;   PROSTATE BIOPSY  2010   WRIST SURGERY Right 1998    FAMILY HISTORY: Family History  Problem Relation Age of Onset   Stroke Mother    Heart disease Mother 61       stent   Heart disease Father    Kidney disease Father    Heart failure Father    Stroke Brother    Alzheimer's disease Paternal Grandfather    Colon cancer Neg Hx    Esophageal cancer Neg Hx    Liver cancer Neg Hx    Pancreatic cancer Neg Hx    Rectal cancer Neg Hx    Stomach cancer Neg Hx    Seizures Neg Hx    Migraines Neg Hx     SOCIAL HISTORY: Social History   Socioeconomic History   Marital status: Married    Spouse name: Not on file   Number of children: Not on file   Years of education: Not on file   Highest education level: Not on file  Occupational History   Not on file  Tobacco Use   Smoking status: Never   Smokeless tobacco: Never  Vaping Use   Vaping status: Never Used  Substance and Sexual Activity   Alcohol  use: Not Currently    Comment: maybe 2-3 beers in a year   Drug use: No   Sexual activity: Not Currently  Other Topics Concern   Not on file  Social History Narrative   Lives at home with wife   Right handed   Caffeine: half zero sugar dr. Nunzio daily   Works 2 days per week Management Consultant)   Social Drivers of Health   Financial Resource Strain: Low  Risk (09/28/2023)   Received from [employer]   Overall Financial Resource Strain (CARDIA)    Difficulty of Paying Living Expenses: Not hard at all  Food Insecurity: No Food Insecurity (04/24/2024)   Hunger Vital Sign    Worried About Running Out of Food in the Last Year: Never true    Ran Out of Food in the Last Year: Never true  Transportation Needs: Unmet Transportation Needs (  04/24/2024)   PRAPARE - Administrator, Civil Service (Medical): Yes    Lack of Transportation (Non-Medical): Yes  Physical Activity: Not on file  Stress: Not on file  Social Connections: Moderately Integrated (04/24/2024)   Social Connection and Isolation Panel    Frequency of Communication with Friends and Family: More than three times a week    Frequency of Social Gatherings with Friends and Family: Three times a week    Attends Religious Services: More than 4 times per year    Active Member of Clubs or Organizations: No    Attends Banker Meetings: Never    Marital Status: Married  Catering Manager Violence: Not At Risk (04/24/2024)   Humiliation, Afraid, Rape, and Kick questionnaire    Fear of Current or Ex-Partner: No    Emotionally Abused: No    Physically Abused: No    Sexually Abused: No     PHYSICAL EXAM  GENERAL EXAM/CONSTITUTIONAL: Vitals:  Vitals:   05/28/24 1128  BP: 130/66  Pulse: 62  SpO2: 98%  Weight: 153 lb 3.2 oz (69.5 kg)  Height: 5' 6 (1.676 m)   Body mass index is 24.73 kg/m. Wt Readings from Last 3 Encounters:  05/28/24 153 lb 3.2 oz (69.5 kg)  05/15/24 151 lb (68.5 kg)  04/23/24 147 lb (66.7 kg)   Patient is in no distress; well developed, nourished and groomed; neck is supple  CARDIOVASCULAR: Examination of carotid arteries is normal; no carotid bruits Regular rate and rhythm, no murmurs Examination of peripheral vascular system by observation and palpation is normal  EYES: Ophthalmoscopic exam of optic discs and posterior segments is  normal; no papilledema or hemorrhages No results found.  MUSCULOSKELETAL: Gait, strength, tone, movements noted in Neurologic exam below  NEUROLOGIC: MENTAL STATUS:     05/28/2024   11:43 AM 01/24/2024    9:34 AM 10/12/2023    8:27 AM  MMSE - Mini Mental State Exam  Orientation to time 2 4 3   Orientation to Place 3 4 2   Registration 3 3 3   Attention/ Calculation 1 1 1   Recall 2 1 0  Language- name 2 objects 2 2 2   Language- repeat 0 1 0  Language- follow 3 step command 3 3 3   Language- read & follow direction 0 1 1  Write a sentence 1 1 1   Copy design 0 1 1  Total score 17 22 17    awake, alert, oriented to person DECR MEMORY DECR attention and concentration language fluent, comprehension intact, naming intact fund of knowledge appropriate  CRANIAL NERVE:  2nd - no papilledema on fundoscopic exam 2nd, 3rd, 4th, 6th - pupils equal and reactive to light, visual fields full to confrontation, extraocular muscles intact, no nystagmus 5th - facial sensation symmetric 7th - facial strength symmetric 8th - hearing intact 9th - palate elevates symmetrically, uvula midline 11th - shoulder shrug symmetric 12th - tongue protrusion midline  MOTOR:  MILD POSTURAL TREMOR INTERMITTENT COGWHEELING RIGIDITY IN BUE MILD BRADYKINESIA IN BUE AND BLE BUE 4; BLE 3-4  SENSORY:  normal and symmetric to light touch, temperature, vibration  COORDINATION:  finger-nose-finger, fine finger movements normal  REFLEXES:  deep tendon reflexes 2+ IN BUE; 1+ IN BLE POSITIVE SNOUT REFLEX BORDERLINE HOFFMANS  GAIT/STATION:  USING WALKER; UNSTEADY     DIAGNOSTIC DATA (LABS, IMAGING, TESTING) - I reviewed patient records, labs, notes, testing and imaging myself where available.  Lab Results  Component Value Date   WBC 8.2 05/15/2024  HGB 15.6 05/15/2024   HCT 47.1 05/15/2024   MCV 85.0 05/15/2024   PLT 293 05/15/2024      Component Value Date/Time   NA 141 05/15/2024 1058   NA 143  12/14/2022 1431   K 4.4 05/15/2024 1058   CL 104 05/15/2024 1058   CO2 28 05/15/2024 1058   GLUCOSE 92 05/15/2024 1058   BUN 20 05/15/2024 1058   BUN 26 12/14/2022 1431   CREATININE 1.46 (H) 05/15/2024 1058   CALCIUM  9.6 05/15/2024 1058   PROT 6.3 05/15/2024 1058   ALBUMIN 3.7 04/25/2024 0302   AST 17 05/15/2024 1058   ALT 8 (L) 05/15/2024 1058   ALKPHOS 105 04/25/2024 0302   BILITOT 0.6 05/15/2024 1058   GFRNONAA 39 (L) 05/01/2024 0253   GFRNONAA 42 (L) 12/02/2020 1042   GFRAA 49 (L) 12/02/2020 1042   Lab Results  Component Value Date   CHOL 182 05/15/2024   HDL 47 05/15/2024   LDLCALC 110 (H) 05/15/2024   TRIG 131 05/15/2024   CHOLHDL 3.9 05/15/2024   Lab Results  Component Value Date   HGBA1C 6.6 (H) 05/15/2024   Lab Results  Component Value Date   VITAMINB12 2,000 (H) 12/06/2023   Lab Results  Component Value Date   TSH 0.897 04/08/2024      Component Ref Range & Units (hover) 1 yr ago  A -- Beta-amyloid 42/40 Ratio 0.085 Low   Beta-amyloid 42 24.27  Beta-amyloid 40 285.09  T -- p-tau181 4.28 High   N -- NfL, Plasma 7.40  ATN SUMMARY Comment  Comment:                        A+ T+ N- A low beta-amyloid 42/40 and a high pTau181 concentration were observed. A normal NfL concentration was observed at this time. These results are consistent with the presence of Alzheimer's related pathology.     08/17/22 MRI brain (with and without) demonstrating: - Mild periventricular and subcortical foci of nonspecific gliosis. - No acute findings.  03/17/23 MRI cervical spine 1. No acute abnormality of the cervical spine. 2. Multilevel degenerative changes of the cervical spine with severe right neural foraminal narrowing at C3-C4 and moderate right neural foraminal narrowing at C4-C5. 3. Mild spinal canal narrowing at C6-C7.  03/17/23 MRI lumbar spine 1. Acute superior endplate compression deformity at T12. No significant retropulsion or evidence of epidural blood  products. 2. Multilevel degenerative changes of the lumbar spine as described above.  02/02/23 DATscan  [I reviewed images myself and agree with interpretation. -VRP]  Ioflupane scan within normal limits. No reduced radiotracer activity in basal ganglia to suggest Parkinson's syndrome pathology.  04/07/24 MRI cervical spine [I reviewed images myself and agree with interpretation. -VRP]  1. Chronic degenerative disc disease at C3-4 with mild-to-moderate central spinal canal stenosis and moderate right neural foraminal stenosis. 2. Broad-based disc bulging at C6-7 with mild-to-moderate central spinal canal stenosis. Neural foramina are patent.  04/03/24 MRI lumbar spine [I reviewed images myself and agree with interpretation. -VRP]  1. Acute compression fracture of L1 superior endplate with up to 35% height loss, but with no significant retropulsion. 2. Chronic compression fracture of T12 with up to 50% height loss and trace (2 mm) bony retropulsion. 3. Underlying multilevel lumbar spondylosis without significant spinal stenosis. . Mild to moderate multilevel foraminal narrowing as above.    ASSESSMENT AND PLAN  73 y.o. year old male here with:  Dx:  1. Moderate late  onset Alzheimer's dementia without behavioral disturbance, psychotic disturbance, mood disturbance, or anxiety (HCC)   2. Postural tremor   3. Essential tremor   4. Gait difficulty     PLAN:  MEMORY LOSS (A+T+N-; MMSE 17/30; decline in ADLs; consistent with alzheimer's dementia) - continue donepezil  5mg  daily (had side effect with night time dosing) - also had side effects with memantine  - try to stay active physically and get some exercise (at least 15-30 minutes per day) - eat a nutritious diet with lean protein, plants / vegetables, whole grains; avoid ultra-processed foods - increase social activities, brain stimulation, games, puzzles, hobbies, crafts, arts, music; try new activities; keep it fun! - aim for at  least 7-8 hours sleep per night (or more) - avoid smoking and alcohol  - caution with medications, finances, no driving - safety / supervision issues reviewed - caregiver resources provided (including [email or web address])  TREMORS, GAIT DIFF (likely related to trauma in Oct 2025, fall, mother passing away, anxiety, pain; also underlying dementia; parkinson's disease less likely even though carb/levo seems to have helped; suspect underlying essential tremor also for several years) - continue carb/levo 25/100 1 tabs 3 times per day - fall precautions reviewed; use walker  Meds ordered this encounter  Medications   carbidopa -levodopa  (SINEMET  IR) 25-100 MG tablet    Sig: Take 1 tablet by mouth 3 (three) times daily.    Dispense:  270 tablet    Refill:  4   Return in about 6 months (around 11/26/2024).  I spent 60 minutes of face-to-face and non-face-to-face time with patient.  This included previsit chart review, lab review, study review, order entry, electronic health record documentation, patient education.    EDUARD FABIENE HANLON, MD 05/28/2024, 6:36 PM Certified in Neurology, Neurophysiology and Neuroimaging  [facility] [address] [place], [place] [address] [phone]

## 2024-05-28 NOTE — Patient Instructions (Signed)
 MEMORY LOSS (A+T+N-; MMSE 17/30; decline in ADLs; consistent with alzheimer's dementia) - continue donepezil  5mg  daily (had side effect with night time dosing) - also had side effects with memantine  - try to stay active physically and get some exercise (at least 15-30 minutes per day) - eat a nutritious diet with lean protein, plants / vegetables, whole grains; avoid ultra-processed foods - increase social activities, brain stimulation, games, puzzles, hobbies, crafts, arts, music; try new activities; keep it fun! - aim for at least 7-8 hours sleep per night (or more) - avoid smoking and alcohol  - caution with medications, finances, no driving - safety / supervision issues reviewed - caregiver resources provided (including [email or web address])  TREMORS, GAIT DIFF (likely related to trauma in Oct 2025, fall, mother passing away, anxiety, pain; also underlying dementia; parkinson's disease less likely even though carb/levo seems to have helped; suspect underlying essential tremor also for several years) - continue carb/levo 25/100 1 tabs 3 times per day - fall precautions reviewed; use walker

## 2024-05-29 ENCOUNTER — Ambulatory Visit
Admission: RE | Admit: 2024-05-29 | Discharge: 2024-05-29 | Disposition: A | Discharge status: Home / Self Care | Source: Ambulatory Visit | Attending: Family Medicine | Admitting: Family Medicine

## 2024-05-29 ENCOUNTER — Ambulatory Visit: Admitting: Family Medicine

## 2024-05-29 ENCOUNTER — Encounter: Payer: Self-pay | Admitting: Family Medicine

## 2024-05-29 ENCOUNTER — Ambulatory Visit: Payer: Self-pay | Admitting: Family Medicine

## 2024-05-29 VITALS — BP 120/72 | HR 60 | Temp 97.6°F | Ht 66.0 in | Wt 153.4 lb

## 2024-05-29 DIAGNOSIS — L03032 Cellulitis of left toe: Secondary | ICD-10-CM

## 2024-05-29 DIAGNOSIS — Z8739 Personal history of other diseases of the musculoskeletal system and connective tissue: Secondary | ICD-10-CM

## 2024-05-29 DIAGNOSIS — N1832 Chronic kidney disease, stage 3b: Secondary | ICD-10-CM

## 2024-05-29 MED ORDER — DOXYCYCLINE HYCLATE 100 MG PO TABS: 100.0000 mg | ORAL_TABLET | Freq: Two times a day (BID) | ORAL | 0 refills | Status: AC

## 2024-05-29 NOTE — Progress Notes (Signed)
 Patient Office Visit  Assessment & Plan:  Cellulitis of toe of left foot -     CBC with Differential/Platelet -     Ambulatory referral to Podiatry -     DG Foot Complete Left; Future  Type 2 diabetes mellitus with stage 3b chronic kidney disease, without long-term current use of insulin  (HCC)  History of gout -     Uric acid  Other orders -     Doxycycline Hyclate; Take 1 tablet (100 mg total) by mouth 2 (two) times daily for 10 days.  Dispense: 20 tablet; Refill: 0   Assessment and Plan    Cellulitis of left toe Acute swelling and redness of the left toe, likely cellulitis. Differential includes gout and trauma. Concern for infection due to diabetes. - Ordered x-ray of the left foot to rule out fracture. - Prescribed antibiotics, avoiding penicillin due to allergy. - Ordered blood work to check white cell count and uric acid levels. - Referred to podiatrist for further evaluation and management. - Advised warm water soaks for the toe.  Type 2 diabetes mellitus with diabetic chronic kidney disease Diabetes management is crucial due to infection risk and delayed healing.     Patient prefers to get phone call rather than mychart message.  Follow-up on lab work and notify patient/wife.  X-ray ordered at [facility].  Podiatry consult ordered.    Return if symptoms worsen or fail to improve.   Subjective:    Patient ID: [name], male    DOB: 1950/11/02  Age: 73 y.o. MRN: [id number]  Chief Complaint  Patient presents with   Foot Pain    Pt c/o redness, swelling and broken skin to L big toe joint.     Foot Pain   Discussed the use of AI scribe software for clinical note transcription with the patient, who gave verbal consent to proceed.  History of Present Illness       History of Present Illness [name] is a 73 year old male with diabetes who presents with a swollen and painful left big toe. Patient has had bunion for many years but the  past week has had red, swollen first metatarsal.   He has experienced a swollen and painful big toe, which has worsened over the past week. The swelling and redness have increased, and he describes the toe as 'angry' and 'jacked up.' He recalls a possible trauma to the toe, potentially from dropping something on it, but cannot remember the exact details. A small sore on the toe initially seemed to improve but has since worsened. No drainage noted. Patient does have type 2 DM  He has a history of gout but has not experienced a flare-up in a long time and is not currently on medication for it. He does not exhibit typical gout symptoms, such as severe pain preventing him from wearing shoes or having a sheet touch his foot. The toe is not hot to the touch, and he can still walk and wear shoes without significant discomfort.  He is diabetic. No fever or chills are reported, although he experiences some temperature changes when getting out of bed, which are not associated with infection symptoms.  His caregiver has been managing the toe with peroxide and Neosporin, and he has been monitoring for signs of infection, such as pus or green discharge, which have not been observed.  He lives in [place] and has been down for over two months in [place],  affecting his mobility and requiring assistance from his caregiver. Patient has UTD Tetanus shot  Physical Exam EXTREMITIES: left great toe Foot is swollen, inflamed, and edematous.  Assessment and Plan Cellulitis of left toe Acute swelling and redness of the left toe, likely cellulitis. Differential includes gout and trauma. Concern for infection due to diabetes. - Ordered x-ray of the left foot to rule out fracture. - Prescribed antibiotics, avoiding penicillin due to allergy. - Ordered blood work to check white cell count and uric acid levels. - Referred to podiatrist for further evaluation and management. - Advised warm water soaks for the toe.  Type  2 diabetes mellitus with diabetic chronic kidney disease Diabetes management is crucial due to infection risk and delayed healing.    The 10-year ASCVD risk score (Arnett DK, et al., 2019) is: 38.9%  Past Medical History:  Diagnosis Date   Allergy    Bone spur    Left Shoulder   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 (HCC) 07/18/2017   Fracture    right forearm   Gout    Hyperlipidemia    Hypertension    Other testicular hypofunction    PONV (postoperative nausea and vomiting)    Type II or unspecified type diabetes mellitus without mention of complication, not stated as uncontrolled    Past Surgical History:  Procedure Laterality Date   CATARACT EXTRACTION Left 08/2023   CYST REMOVAL HAND     ORIF RADIAL FRACTURE Right 07/18/2019   Procedure: OPEN REDUCTION INTERNAL FIXATION (ORIF) RADIUS AND ULNA FRACTURE;  Surgeon: Kendal Franky SQUIBB, MD;  Location: [facility] OR;  Service: Orthopedics;  Laterality: Right;   PROSTATE BIOPSY  2010   WRIST SURGERY Right 1998   Social History   Tobacco Use   Smoking status: Never   Smokeless tobacco: Never  Vaping Use   Vaping status: Never Used  Substance Use Topics   Alcohol  use: Not Currently    Comment: maybe 2-3 beers in a year   Drug use: No   Family History  Problem Relation Age of Onset   Stroke Mother    Heart disease Mother 64       stent   Heart disease Father    Kidney disease Father    Heart failure Father    Stroke Brother    Alzheimer's disease Paternal Grandfather    Colon cancer Neg Hx    Esophageal cancer Neg Hx    Liver cancer Neg Hx    Pancreatic cancer Neg Hx    Rectal cancer Neg Hx    Stomach cancer Neg Hx    Seizures Neg Hx    Migraines Neg Hx    Allergies  Allergen Reactions   Tape Other (See Comments)    SKIN IS VERY THIN. IT TEARS AND BRUISES VERY EASILY!!   Crestor [Rosuvastatin] Diarrhea   Penicillins Hives, Nausea And Vomiting, Swelling and Other (See Comments)    Severe hives and possible  swelling of the throat   Welchol [Colesevelam Hcl] Diarrhea   Zoloft [Sertraline Hcl] Other (See Comments)    Reaction not recalled     ROS    Objective:    BP 120/72   Pulse 60   Temp 97.6 F (36.4 C)   Ht 5' 6 (1.676 m)   Wt 153 lb 6.4 oz (69.6 kg)   SpO2 97%   BMI 24.76 kg/m  BP Readings from Last 3 Encounters:  05/29/24 120/72  05/28/24 130/66  05/15/24 122/62  Wt Readings from Last 3 Encounters:  05/29/24 153 lb 6.4 oz (69.6 kg)  05/28/24 153 lb 3.2 oz (69.5 kg)  05/15/24 151 lb (68.5 kg)    Physical Exam Vitals and nursing note reviewed.  Constitutional:      Appearance: Normal appearance.  HENT:     Head: Normocephalic.     Right Ear: Tympanic membrane, ear canal and external ear normal.     Left Ear: Tympanic membrane, ear canal and external ear normal.  Eyes:     Extraocular Movements: Extraocular movements intact.     Conjunctiva/sclera: Conjunctivae normal.     Pupils: Pupils are equal, round, and reactive to light.  Cardiovascular:     Rate and Rhythm: Normal rate and regular rhythm.     Heart sounds: Normal heart sounds.  Pulmonary:     Effort: Pulmonary effort is normal.     Breath sounds: Normal breath sounds.  Musculoskeletal:     Right lower leg: No edema.     Left lower leg: No edema.  Neurological:     General: No focal deficit present.     Mental Status: He is alert and oriented to person, place, and time.  Psychiatric:        Mood and Affect: Mood normal.        Behavior: Behavior normal.        Thought Content: Thought content normal.        Judgment: Judgment normal.      No results found for any visits on 05/29/24.

## 2024-05-29 NOTE — Addendum Note (Signed)
 Addended by: ALETHA CONNIE HERO on: 05/29/2024 08:06 AM   Modules accepted: Level of Service

## 2024-05-30 ENCOUNTER — Other Ambulatory Visit: Payer: Self-pay | Admitting: Orthopedic Surgery

## 2024-05-30 DIAGNOSIS — M546 Pain in thoracic spine: Secondary | ICD-10-CM

## 2024-05-30 DIAGNOSIS — S32010A Wedge compression fracture of first lumbar vertebra, initial encounter for closed fracture: Secondary | ICD-10-CM

## 2024-05-30 LAB — CBC WITH DIFFERENTIAL/PLATELET
Absolute Lymphocytes: 1455 {cells}/uL (ref 850–3900)
Absolute Monocytes: 601 {cells}/uL (ref 200–950)
Basophils Absolute: 100 {cells}/uL (ref 0–200)
Basophils Relative: 1.3 %
Eosinophils Absolute: 701 {cells}/uL — ABNORMAL HIGH (ref 15–500)
Eosinophils Relative: 9.1 %
HCT: 46.9 % (ref 39.4–51.1)
Hemoglobin: 15.2 g/dL (ref 13.2–17.1)
MCH: 27.8 pg (ref 27.0–33.0)
MCHC: 32.4 g/dL (ref 31.6–35.4)
MCV: 85.9 fL (ref 81.4–101.7)
MPV: 9.8 fL (ref 7.5–12.5)
Monocytes Relative: 7.8 %
Neutro Abs: 4843 {cells}/uL (ref 1500–7800)
Neutrophils Relative %: 62.9 %
Platelets: 273 Thousand/uL (ref 140–400)
RBC: 5.46 Million/uL (ref 4.20–5.80)
RDW: 13.1 % (ref 11.0–15.0)
Total Lymphocyte: 18.9 %
WBC: 7.7 Thousand/uL (ref 3.8–10.8)

## 2024-05-30 LAB — URIC ACID: Uric Acid, Serum: 5.6 mg/dL (ref 4.0–8.0)

## 2024-05-31 ENCOUNTER — Other Ambulatory Visit (HOSPITAL_COMMUNITY): Payer: Self-pay | Admitting: Orthopedic Surgery

## 2024-05-31 DIAGNOSIS — S32010A Wedge compression fracture of first lumbar vertebra, initial encounter for closed fracture: Secondary | ICD-10-CM

## 2024-05-31 DIAGNOSIS — M546 Pain in thoracic spine: Secondary | ICD-10-CM

## 2024-06-03 ENCOUNTER — Other Ambulatory Visit: Payer: Self-pay | Admitting: Family Medicine

## 2024-06-03 DIAGNOSIS — E1169 Type 2 diabetes mellitus with other specified complication: Secondary | ICD-10-CM

## 2024-06-03 NOTE — Telephone Encounter (Unsigned)
 Copied from CRM [id number]. Topic: Clinical - Medication Refill >> Jun 03, 2024  9:42 AM Delon DASEN wrote: Medication: ezetimibe  (ZETIA ) 10 MG tablet  Has the patient contacted their pharmacy? No (Agent: If no, request that the patient contact the pharmacy for the refill. If patient does not wish to contact the pharmacy document the reason why and proceed with request.) (Agent: If yes, when and what did the pharmacy advise?)  This is the patient's preferred pharmacy:  [facility] [id number] - [place], [place] - [address] [place] [address] Phone: [phone] Fax: [phone]    Is this the correct pharmacy for this prescription? Yes If no, delete pharmacy and type the correct one.   Has the prescription been filled recently? Yes  Is the patient out of the medication? Yes  Has the patient been seen for an appointment in the last year OR does the patient have an upcoming appointment? Yes  Can we respond through MyChart? Yes  Agent: Please be advised that Rx refills may take up to 3 business days. We ask that you follow-up with your pharmacy.

## 2024-06-04 ENCOUNTER — Telehealth: Payer: Self-pay | Admitting: Family Medicine

## 2024-06-04 ENCOUNTER — Ambulatory Visit: Admitting: Podiatry

## 2024-06-04 NOTE — Telephone Encounter (Unsigned)
 Copied from CRM #[id number]. Topic: Clinical - Medication Refill >> Jun 04, 2024  4:15 PM Jasmin G wrote: Medication: carvedilol  (COREG ) 3.125 MG tablet  Has the patient contacted their pharmacy? Yes (Agent: If no, request that the patient contact the pharmacy for the refill. If patient does not wish to contact the pharmacy document the reason why and proceed with request.) (Agent: If yes, when and what did the pharmacy advise?)  This is the patient's preferred pharmacy:  [facility] [id number] - [place], [place] - [address] [place] [address] Phone: [phone] Fax: [phone]  Is this the correct pharmacy for this prescription? Yes If no, delete pharmacy and type the correct one.   Has the prescription been filled recently? Yes  Is the patient out of the medication? No  Has the patient been seen for an appointment in the last year OR does the patient have an upcoming appointment? Yes  Can we respond through MyChart? No  Agent: Please be advised that Rx refills may take up to 3 business days. We ask that you follow-up with your pharmacy.

## 2024-06-05 ENCOUNTER — Encounter: Payer: Self-pay | Admitting: Podiatry

## 2024-06-05 ENCOUNTER — Ambulatory Visit (INDEPENDENT_AMBULATORY_CARE_PROVIDER_SITE_OTHER)

## 2024-06-05 ENCOUNTER — Ambulatory Visit: Admitting: Podiatry

## 2024-06-05 DIAGNOSIS — M205X2 Other deformities of toe(s) (acquired), left foot: Secondary | ICD-10-CM | POA: Diagnosis not present

## 2024-06-05 DIAGNOSIS — M1A9XX1 Chronic gout, unspecified, with tophus (tophi): Secondary | ICD-10-CM | POA: Diagnosis not present

## 2024-06-05 DIAGNOSIS — M21612 Bunion of left foot: Secondary | ICD-10-CM

## 2024-06-05 DIAGNOSIS — M21619 Bunion of unspecified foot: Secondary | ICD-10-CM

## 2024-06-05 DIAGNOSIS — M25572 Pain in left ankle and joints of left foot: Secondary | ICD-10-CM | POA: Diagnosis not present

## 2024-06-05 NOTE — Telephone Encounter (Signed)
 Rx 05/15/24 #90 1RF- too soon Requested Prescriptions  Pending Prescriptions Disp Refills   ezetimibe  (ZETIA ) 10 MG tablet 90 tablet 1    Sig: Take 1 tablet (10 mg total) by mouth at bedtime. TAKE 1 TABLET BY MOUTH DAILY FOR CHOLESTEROL     Cardiovascular:  Antilipid - Sterol Transport Inhibitors Failed - 06/05/2024  1:41 PM      Failed - ALT in normal range and within 360 days    ALT  Date Value Ref Range Status  05/15/2024 8 (L) 9 - 46 U/L Final         Failed - Lipid Panel in normal range within the last 12 months    Cholesterol  Date Value Ref Range Status  05/15/2024 182 <200 mg/dL Final   LDL Cholesterol (Calc)  Date Value Ref Range Status  05/15/2024 110 (H) mg/dL (calc) Final    Comment:    Reference range: <100 . Desirable range <100 mg/dL for primary prevention;   <70 mg/dL for patients with CHD or diabetic patients  with > or = 2 CHD risk factors. SABRA LDL-C is now calculated using the Martin-Hopkins  calculation, which is a validated novel method providing  better accuracy than the Friedewald equation in the  estimation of LDL-C.  Gladis APPLETHWAITE et al. SANDREA. 7986;689(80): 2061-2068  (http://education.[employer].com/faq/FAQ164)    HDL  Date Value Ref Range Status  05/15/2024 47 > OR = 40 mg/dL Final   Triglycerides  Date Value Ref Range Status  05/15/2024 131 <150 mg/dL Final         Passed - AST in normal range and within 360 days    AST  Date Value Ref Range Status  05/15/2024 17 10 - 35 U/L Final         Passed - Patient is not pregnant      Passed - Valid encounter within last 12 months    Recent Outpatient Visits           1 week ago Cellulitis of toe of left foot   [place] [facility] Aletha Bene, MD   3 weeks ago Acute metabolic encephalopathy   [place] [facility] Aletha Bene, MD   2 months ago Primary hypertension   [place] [facility] Aletha Bene, MD   6  months ago Primary hypertension   [place] [facility] Aletha Bene, MD

## 2024-06-05 NOTE — Progress Notes (Signed)
 Patient presents with complaint of pain and redness around the first metatarsal phalangeal joint of the left foot.  Has been sore for several weeks his family doctor put him on doxycycline .  He has a history of gout.  Has not had any flares in several years.  He intermittently takes allopurinol .  He is currently recovering from a back injury.  No fever or chills or nausea or vomiting.   Physical exam:  General appearance: Pleasant, and in no acute distress. AOx3.  Vascular: Pedal pulses: DP 2/4 bilaterally, PT 2/4 bilaterally. minimal edema lower legs bilaterally. Capillary fill time immediate bilaterally.  Neurological: Light touch intact feet bilaterally.  Normal Achilles reflex bilaterally.  No clonus or spasticity noted.   Dermatologic:   Focal redness around the and enlarged swollen area which is probably a tophus on the medial aspect of the first metatarsal phalangeal joint of the left foot.  No open lesions or drainage.  Skin normal temperature bilaterally.  Skin normal color, tone, and texture bilaterally.   Musculoskeletal: Limitation in range of motion first metatarsal phalangeal joint left.  No tenderness with range of motion of the first MTP left tenderness and swelling along the medial aspect of the first metatarsal phalangeal joint probably consistent with a inflamed tophus..  Radiographs: 3 views foot left: Severe joints uneven joint space narrowing at the first metatarsal phalangeal joint.  Severe osteophytic changes around the first metatarsal phalangeal joint.  No erosive changes noted.  Soft tissue density on the medial aspect of the first metatarsal phalangeal joint.  No evidence of any osteomyelitis.  No subcutaneous emphysema.  Labs: Uric acid is at 5.6 on 05/29/2024  Diagnosis: 1.  Arthralgia first metatarsal phalangeal joint left foot 2.  Hallux limitus left 3.  Acute tophaceous gout left foot  Plan: -New patient office visit for evaluation and management level  3. - Discussed with he and his wife the inflammation at the first metatarsal phalangeal joint given the level of pain he is having right now which is not that severe I think we will hold off on an injection.  Told him to use topical diclofenac gel.  Ice 20 minutes several times a day.  Will get him a surgical shoe to help offload the joint.  Will refrain from prednisone  at the moment given that he is recovering from back fractures at this point.  We could start allopurinol  and colchicine  however they wanted to hold off on this. -Dispensed surgical shoe foot left  Return 6 weeks follow-up tophaceous gout left foot

## 2024-06-06 ENCOUNTER — Telehealth: Payer: Self-pay

## 2024-06-06 ENCOUNTER — Ambulatory Visit (HOSPITAL_BASED_OUTPATIENT_CLINIC_OR_DEPARTMENT_OTHER)
Admission: RE | Admit: 2024-06-06 | Discharge: 2024-06-06 | Disposition: A | Discharge status: Home / Self Care | Source: Ambulatory Visit | Attending: Orthopedic Surgery | Admitting: Orthopedic Surgery

## 2024-06-06 ENCOUNTER — Other Ambulatory Visit: Payer: Self-pay

## 2024-06-06 ENCOUNTER — Ambulatory Visit (HOSPITAL_COMMUNITY)
Admission: RE | Admit: 2024-06-06 | Discharge: 2024-06-06 | Attending: Orthopedic Surgery | Admitting: Orthopedic Surgery

## 2024-06-06 DIAGNOSIS — M546 Pain in thoracic spine: Secondary | ICD-10-CM | POA: Diagnosis present

## 2024-06-06 DIAGNOSIS — M47816 Spondylosis without myelopathy or radiculopathy, lumbar region: Secondary | ICD-10-CM

## 2024-06-06 DIAGNOSIS — S32010A Wedge compression fracture of first lumbar vertebra, initial encounter for closed fracture: Secondary | ICD-10-CM

## 2024-06-06 DIAGNOSIS — S22080A Wedge compression fracture of T11-T12 vertebra, initial encounter for closed fracture: Secondary | ICD-10-CM | POA: Diagnosis not present

## 2024-06-06 NOTE — Telephone Encounter (Signed)
 Prescription Request  06/06/2024  LOV: 05/29/24  What is the name of the medication or equipment? carvedilol  (COREG ) 3.125 MG tablet [[id number]]   Have you contacted your pharmacy to request a refill? Yes   Which pharmacy would you like this sent to?  [facility] [id number] [place], [place] - [address] [place] [address] Phone: [phone] Fax: [phone]    Patient notified that their request is being sent to the clinical staff for review and that they should receive a response within 2 business days.   Please advise at [facility] [phone]

## 2024-06-06 NOTE — Telephone Encounter (Signed)
 Sent in medication

## 2024-06-16 ENCOUNTER — Other Ambulatory Visit: Payer: Self-pay | Admitting: Family Medicine

## 2024-06-16 DIAGNOSIS — I10 Essential (primary) hypertension: Secondary | ICD-10-CM

## 2024-06-24 ENCOUNTER — Telehealth: Payer: Self-pay | Admitting: Diagnostic Neuroimaging

## 2024-06-24 NOTE — Telephone Encounter (Signed)
 Patient's wife LVM at need an appointment as soon as possible. Patient is on the lowest dosage for tremors, carbidopa -levodopa  (SINEMET  IR) 25-100 MG tablet. Patient having more tremors before the 4 pm medication, he can hardly walk. Would like a call from the nurse as soon as possible to discuss what to do.

## 2024-06-25 NOTE — Telephone Encounter (Signed)
 Wife returned call.  Pt is doing better now, that she changed the time that he is taking the sinemet  from 03-23-09  TO 9-3-9.  He was having worsening symptoms prior to the 4 oclock dose and so changing the time has benefited the pt.  I told her that will relay to provider.  She appreciated call back.

## 2024-06-25 NOTE — Telephone Encounter (Signed)
 Wife has returned call to RN

## 2024-06-25 NOTE — Telephone Encounter (Signed)
 Called no answer and VM full could LM.

## 2024-07-02 ENCOUNTER — Telehealth: Payer: Self-pay

## 2024-07-02 NOTE — Telephone Encounter (Signed)
 Copied from CRM [id number]. Topic: Clinical - Home Health Verbal Orders >> Jul 02, 2024  1:06 PM Delon DASEN wrote: Caller/Agency: Corean with [facility] Callback Number: [phone] secure Service Requested: home  health aide Frequency: 1x wk for 4 wks Any new concerns about the patient? No

## 2024-07-02 NOTE — Telephone Encounter (Signed)
 Okay'ed orders on secure line.

## 2024-07-08 ENCOUNTER — Ambulatory Visit: Admitting: Family Medicine

## 2024-07-16 DIAGNOSIS — F02C0 Dementia in other diseases classified elsewhere, severe, without behavioral disturbance, psychotic disturbance, mood disturbance, and anxiety: Secondary | ICD-10-CM | POA: Diagnosis not present

## 2024-07-16 DIAGNOSIS — I129 Hypertensive chronic kidney disease with stage 1 through stage 4 chronic kidney disease, or unspecified chronic kidney disease: Secondary | ICD-10-CM | POA: Diagnosis not present

## 2024-07-16 DIAGNOSIS — N183 Chronic kidney disease, stage 3 unspecified: Secondary | ICD-10-CM | POA: Diagnosis not present

## 2024-07-16 DIAGNOSIS — G20A1 Parkinson's disease without dyskinesia, without mention of fluctuations: Secondary | ICD-10-CM | POA: Diagnosis not present

## 2024-07-16 DIAGNOSIS — F5101 Primary insomnia: Secondary | ICD-10-CM | POA: Diagnosis not present

## 2024-07-16 DIAGNOSIS — M1030 Gout due to renal impairment, unspecified site: Secondary | ICD-10-CM | POA: Diagnosis not present

## 2024-07-16 DIAGNOSIS — S32010D Wedge compression fracture of first lumbar vertebra, subsequent encounter for fracture with routine healing: Secondary | ICD-10-CM | POA: Diagnosis not present

## 2024-07-16 DIAGNOSIS — E1142 Type 2 diabetes mellitus with diabetic polyneuropathy: Secondary | ICD-10-CM | POA: Diagnosis not present

## 2024-07-16 DIAGNOSIS — E1122 Type 2 diabetes mellitus with diabetic chronic kidney disease: Secondary | ICD-10-CM | POA: Diagnosis not present

## 2024-07-16 DIAGNOSIS — Z9181 History of falling: Secondary | ICD-10-CM | POA: Diagnosis not present

## 2024-07-16 DIAGNOSIS — S22080D Wedge compression fracture of T11-T12 vertebra, subsequent encounter for fracture with routine healing: Secondary | ICD-10-CM | POA: Diagnosis not present

## 2024-07-16 DIAGNOSIS — E782 Mixed hyperlipidemia: Secondary | ICD-10-CM | POA: Diagnosis not present

## 2024-08-12 ENCOUNTER — Ambulatory Visit: Admitting: Neurology

## 2024-08-14 ENCOUNTER — Ambulatory Visit: Admitting: Family Medicine

## 2024-11-27 ENCOUNTER — Ambulatory Visit: Admitting: Diagnostic Neuroimaging
